# Patient Record
Sex: Female | Born: 1940 | Race: White | Hispanic: No | Marital: Married | Smoking: Never smoker
Health system: Southern US, Community
[De-identification: ages and names within clinical notes are randomized; demographics above are authoritative.]

## PROBLEM LIST (undated history)

## (undated) DIAGNOSIS — K760 Fatty (change of) liver, not elsewhere classified: Secondary | ICD-10-CM

## (undated) DIAGNOSIS — D0590 Unspecified type of carcinoma in situ of unspecified breast: Secondary | ICD-10-CM

## (undated) DIAGNOSIS — K219 Gastro-esophageal reflux disease without esophagitis: Secondary | ICD-10-CM

## (undated) DIAGNOSIS — E7800 Pure hypercholesterolemia, unspecified: Secondary | ICD-10-CM

## (undated) DIAGNOSIS — C50919 Malignant neoplasm of unspecified site of unspecified female breast: Secondary | ICD-10-CM

## (undated) DIAGNOSIS — Z860101 Personal history of adenomatous and serrated colon polyps: Secondary | ICD-10-CM

## (undated) DIAGNOSIS — M797 Fibromyalgia: Secondary | ICD-10-CM

## (undated) DIAGNOSIS — N390 Urinary tract infection, site not specified: Secondary | ICD-10-CM

## (undated) DIAGNOSIS — H8309 Labyrinthitis, unspecified ear: Secondary | ICD-10-CM

## (undated) DIAGNOSIS — F419 Anxiety disorder, unspecified: Secondary | ICD-10-CM

## (undated) DIAGNOSIS — Z86010 Personal history of colonic polyps: Secondary | ICD-10-CM

## (undated) DIAGNOSIS — I10 Essential (primary) hypertension: Secondary | ICD-10-CM

## (undated) DIAGNOSIS — M1990 Unspecified osteoarthritis, unspecified site: Secondary | ICD-10-CM

## (undated) HISTORY — DX: Pure hypercholesterolemia, unspecified: E78.00

## (undated) HISTORY — DX: Personal history of adenomatous and serrated colon polyps: Z86.0101

## (undated) HISTORY — DX: Fibromyalgia: M79.7

## (undated) HISTORY — DX: Gastro-esophageal reflux disease without esophagitis: K21.9

## (undated) HISTORY — DX: Fatty (change of) liver, not elsewhere classified: K76.0

## (undated) HISTORY — DX: Anxiety disorder, unspecified: F41.9

## (undated) HISTORY — DX: Unspecified osteoarthritis, unspecified site: M19.90

## (undated) HISTORY — DX: Unspecified type of carcinoma in situ of unspecified breast: D05.90

## (undated) HISTORY — DX: Essential (primary) hypertension: I10

## (undated) HISTORY — DX: Urinary tract infection, site not specified: N39.0

## (undated) HISTORY — DX: Labyrinthitis, unspecified ear: H83.09

## (undated) HISTORY — DX: Personal history of colonic polyps: Z86.010

## (undated) HISTORY — PX: CATARACT EXTRACTION: SUR2

## (undated) HISTORY — DX: Malignant neoplasm of unspecified site of unspecified female breast: C50.919

---

## 1978-11-26 HISTORY — PX: FOOT SURGERY: SHX648

## 1999-11-27 DIAGNOSIS — D0590 Unspecified type of carcinoma in situ of unspecified breast: Secondary | ICD-10-CM

## 1999-11-27 DIAGNOSIS — C50919 Malignant neoplasm of unspecified site of unspecified female breast: Secondary | ICD-10-CM

## 1999-11-27 HISTORY — DX: Unspecified type of carcinoma in situ of unspecified breast: D05.90

## 1999-11-27 HISTORY — DX: Malignant neoplasm of unspecified site of unspecified female breast: C50.919

## 2000-07-24 ENCOUNTER — Other Ambulatory Visit: Admission: RE | Admit: 2000-07-24 | Discharge: 2000-07-24 | Payer: Self-pay | Admitting: Family Medicine

## 2000-07-27 HISTORY — PX: BREAST BIOPSY: SHX20

## 2000-08-20 HISTORY — PX: BREAST LUMPECTOMY: SHX2

## 2001-07-24 ENCOUNTER — Other Ambulatory Visit: Admission: RE | Admit: 2001-07-24 | Discharge: 2001-07-24 | Payer: Self-pay | Admitting: Family Medicine

## 2001-09-08 ENCOUNTER — Ambulatory Visit (HOSPITAL_COMMUNITY): Admission: RE | Admit: 2001-09-08 | Discharge: 2001-09-08 | Payer: Self-pay | Admitting: Internal Medicine

## 2001-09-08 ENCOUNTER — Encounter: Payer: Self-pay | Admitting: Internal Medicine

## 2001-09-23 ENCOUNTER — Ambulatory Visit (HOSPITAL_COMMUNITY): Admission: RE | Admit: 2001-09-23 | Discharge: 2001-09-23 | Payer: Self-pay | Admitting: Internal Medicine

## 2001-09-23 ENCOUNTER — Encounter: Payer: Self-pay | Admitting: Internal Medicine

## 2002-07-28 ENCOUNTER — Other Ambulatory Visit: Admission: RE | Admit: 2002-07-28 | Discharge: 2002-07-28 | Payer: Self-pay | Admitting: Family Medicine

## 2003-09-06 ENCOUNTER — Other Ambulatory Visit: Admission: RE | Admit: 2003-09-06 | Discharge: 2003-09-06 | Payer: Self-pay | Admitting: Family Medicine

## 2004-09-12 ENCOUNTER — Ambulatory Visit: Payer: Self-pay | Admitting: Family Medicine

## 2004-09-13 ENCOUNTER — Encounter: Payer: Self-pay | Admitting: Family Medicine

## 2004-09-13 ENCOUNTER — Other Ambulatory Visit: Admission: RE | Admit: 2004-09-13 | Discharge: 2004-09-13 | Payer: Self-pay | Admitting: Family Medicine

## 2004-09-13 LAB — CONVERTED CEMR LAB: Pap Smear: NORMAL

## 2004-11-08 ENCOUNTER — Ambulatory Visit: Payer: Self-pay | Admitting: Family Medicine

## 2005-01-16 ENCOUNTER — Ambulatory Visit: Payer: Self-pay | Admitting: Family Medicine

## 2005-06-27 ENCOUNTER — Ambulatory Visit: Payer: Self-pay | Admitting: Family Medicine

## 2005-06-29 ENCOUNTER — Ambulatory Visit: Payer: Self-pay | Admitting: Family Medicine

## 2006-02-27 ENCOUNTER — Ambulatory Visit: Payer: Self-pay | Admitting: Family Medicine

## 2006-07-10 ENCOUNTER — Ambulatory Visit: Payer: Self-pay | Admitting: Family Medicine

## 2006-07-16 ENCOUNTER — Encounter: Payer: Self-pay | Admitting: Family Medicine

## 2006-07-16 ENCOUNTER — Ambulatory Visit: Payer: Self-pay | Admitting: Family Medicine

## 2006-07-16 ENCOUNTER — Other Ambulatory Visit: Admission: RE | Admit: 2006-07-16 | Discharge: 2006-07-16 | Payer: Self-pay | Admitting: Family Medicine

## 2006-07-16 LAB — CONVERTED CEMR LAB: Pap Smear: NORMAL

## 2006-08-09 LAB — FECAL OCCULT BLOOD, GUAIAC: Fecal Occult Blood: NEGATIVE

## 2006-08-12 ENCOUNTER — Ambulatory Visit: Payer: Self-pay | Admitting: Family Medicine

## 2006-10-07 ENCOUNTER — Ambulatory Visit: Payer: Self-pay | Admitting: Family Medicine

## 2006-11-22 ENCOUNTER — Ambulatory Visit: Payer: Self-pay | Admitting: Family Medicine

## 2007-03-26 ENCOUNTER — Encounter: Payer: Self-pay | Admitting: Family Medicine

## 2007-03-26 DIAGNOSIS — I10 Essential (primary) hypertension: Secondary | ICD-10-CM | POA: Insufficient documentation

## 2007-03-26 DIAGNOSIS — N390 Urinary tract infection, site not specified: Secondary | ICD-10-CM | POA: Insufficient documentation

## 2007-03-26 DIAGNOSIS — H8309 Labyrinthitis, unspecified ear: Secondary | ICD-10-CM | POA: Insufficient documentation

## 2007-03-26 DIAGNOSIS — M797 Fibromyalgia: Secondary | ICD-10-CM | POA: Insufficient documentation

## 2007-10-20 ENCOUNTER — Ambulatory Visit: Payer: Self-pay | Admitting: Family Medicine

## 2007-10-20 LAB — CONVERTED CEMR LAB
ALT: 19 units/L (ref 0–35)
AST: 17 units/L (ref 0–37)
BUN: 17 mg/dL (ref 6–23)
Basophils Absolute: 0 10*3/uL (ref 0.0–0.1)
Basophils Relative: 0.6 % (ref 0.0–1.0)
CO2: 28 meq/L (ref 19–32)
Calcium: 9.1 mg/dL (ref 8.4–10.5)
Chloride: 103 meq/L (ref 96–112)
Cholesterol: 164 mg/dL (ref 0–200)
Creatinine, Ser: 0.9 mg/dL (ref 0.4–1.2)
Eosinophils Absolute: 0.2 10*3/uL (ref 0.0–0.6)
Eosinophils Relative: 3 % (ref 0.0–5.0)
GFR calc Af Amer: 80 mL/min
GFR calc non Af Amer: 66 mL/min
Glucose, Bld: 116 mg/dL — ABNORMAL HIGH (ref 70–99)
HCT: 39.7 % (ref 36.0–46.0)
HDL: 45.2 mg/dL (ref >39.0)
Hemoglobin: 13.5 g/dL (ref 12.0–15.0)
LDL Cholesterol: 103 mg/dL — ABNORMAL HIGH (ref 0–99)
Lymphocytes Relative: 26.4 % (ref 12.0–46.0)
MCHC: 34 g/dL (ref 30.0–36.0)
MCV: 89.8 fL (ref 78.0–100.0)
Monocytes Absolute: 0.5 10*3/uL (ref 0.2–0.7)
Monocytes Relative: 7.7 % (ref 3.0–11.0)
Neutro Abs: 3.7 10*3/uL (ref 1.4–7.7)
Neutrophils Relative %: 62.3 % (ref 43.0–77.0)
Platelets: 238 10*3/uL (ref 150–400)
Potassium: 4.7 meq/L (ref 3.5–5.1)
RBC: 4.42 M/uL (ref 3.87–5.11)
RDW: 11.9 % (ref 11.5–14.6)
Sodium: 138 meq/L (ref 135–145)
TSH: 3.66 microintl units/mL (ref 0.35–5.50)
Total CHOL/HDL Ratio: 3.6
Triglycerides: 80 mg/dL (ref 0–149)
VLDL: 16 mg/dL (ref 0–40)
WBC: 6 10*3/uL (ref 4.5–10.5)

## 2007-10-22 ENCOUNTER — Ambulatory Visit: Payer: Self-pay | Admitting: Family Medicine

## 2007-10-22 ENCOUNTER — Other Ambulatory Visit: Admission: RE | Admit: 2007-10-22 | Discharge: 2007-10-22 | Payer: Self-pay | Admitting: Family Medicine

## 2007-10-22 ENCOUNTER — Encounter: Payer: Self-pay | Admitting: Family Medicine

## 2007-10-24 LAB — CONVERTED CEMR LAB: Pap Smear: NORMAL

## 2007-10-30 ENCOUNTER — Encounter (INDEPENDENT_AMBULATORY_CARE_PROVIDER_SITE_OTHER): Payer: Self-pay | Admitting: *Deleted

## 2007-11-11 ENCOUNTER — Telehealth: Payer: Self-pay | Admitting: Family Medicine

## 2007-11-18 ENCOUNTER — Telehealth (INDEPENDENT_AMBULATORY_CARE_PROVIDER_SITE_OTHER): Payer: Self-pay | Admitting: *Deleted

## 2007-12-11 ENCOUNTER — Encounter: Payer: Self-pay | Admitting: Family Medicine

## 2008-04-01 ENCOUNTER — Telehealth: Payer: Self-pay | Admitting: Family Medicine

## 2008-06-08 ENCOUNTER — Ambulatory Visit: Payer: Self-pay | Admitting: Family Medicine

## 2009-03-07 ENCOUNTER — Ambulatory Visit: Payer: Self-pay | Admitting: Family Medicine

## 2009-03-07 LAB — CONVERTED CEMR LAB
ALT: 19 units/L (ref 0–35)
AST: 16 units/L (ref 0–37)
Albumin: 3.9 g/dL (ref 3.5–5.2)
Alkaline Phosphatase: 80 units/L (ref 39–117)
BUN: 11 mg/dL (ref 6–23)
Basophils Absolute: 0 10*3/uL (ref 0.0–0.1)
Basophils Relative: 0.8 % (ref 0.0–3.0)
Bilirubin, Direct: 0 mg/dL (ref 0.0–0.3)
CO2: 30 meq/L (ref 19–32)
Calcium: 9.1 mg/dL (ref 8.4–10.5)
Chloride: 110 meq/L (ref 96–112)
Cholesterol: 179 mg/dL (ref 0–200)
Creatinine, Ser: 0.9 mg/dL (ref 0.4–1.2)
Creatinine,U: 118.6 mg/dL
Eosinophils Absolute: 0.2 10*3/uL (ref 0.0–0.7)
Eosinophils Relative: 3.1 % (ref 0.0–5.0)
GFR calc non Af Amer: 65.9 mL/min (ref >60)
Glucose, Bld: 101 mg/dL — ABNORMAL HIGH (ref 70–99)
HCT: 40.3 % (ref 36.0–46.0)
HDL: 41.8 mg/dL (ref >39.00)
Hemoglobin: 14.1 g/dL (ref 12.0–15.0)
LDL Cholesterol: 116 mg/dL — ABNORMAL HIGH (ref 0–99)
Lymphocytes Relative: 23 % (ref 12.0–46.0)
Lymphs Abs: 1.4 10*3/uL (ref 0.7–4.0)
MCHC: 34.9 g/dL (ref 30.0–36.0)
MCV: 88.4 fL (ref 78.0–100.0)
Microalb Creat Ratio: 1.7 mg/g (ref 0.0–30.0)
Microalb, Ur: 0.2 mg/dL (ref 0.0–1.9)
Monocytes Absolute: 0.5 10*3/uL (ref 0.1–1.0)
Monocytes Relative: 7.4 % (ref 3.0–12.0)
Neutro Abs: 4 10*3/uL (ref 1.4–7.7)
Neutrophils Relative %: 65.7 % (ref 43.0–77.0)
Platelets: 222 10*3/uL (ref 150.0–400.0)
Potassium: 4.9 meq/L (ref 3.5–5.1)
RBC: 4.56 M/uL (ref 3.87–5.11)
RDW: 11.9 % (ref 11.5–14.6)
Sed Rate: 10 mm/hr (ref 0–22)
Sodium: 144 meq/L (ref 135–145)
TSH: 3.31 microintl units/mL (ref 0.35–5.50)
Total Bilirubin: 0.9 mg/dL (ref 0.3–1.2)
Total CHOL/HDL Ratio: 4
Total Protein: 6.9 g/dL (ref 6.0–8.3)
Triglycerides: 107 mg/dL (ref 0.0–149.0)
VLDL: 21.4 mg/dL (ref 0.0–40.0)
WBC: 6.1 10*3/uL (ref 4.5–10.5)

## 2009-03-10 ENCOUNTER — Encounter: Payer: Self-pay | Admitting: Family Medicine

## 2009-03-10 ENCOUNTER — Other Ambulatory Visit: Admission: RE | Admit: 2009-03-10 | Discharge: 2009-03-10 | Payer: Self-pay | Admitting: Family Medicine

## 2009-03-10 ENCOUNTER — Ambulatory Visit: Payer: Self-pay | Admitting: Family Medicine

## 2009-03-10 LAB — CONVERTED CEMR LAB: Pap Smear: NORMAL

## 2009-03-14 ENCOUNTER — Encounter (INDEPENDENT_AMBULATORY_CARE_PROVIDER_SITE_OTHER): Payer: Self-pay | Admitting: *Deleted

## 2009-04-12 ENCOUNTER — Telehealth: Payer: Self-pay | Admitting: Internal Medicine

## 2009-04-12 ENCOUNTER — Ambulatory Visit: Payer: Self-pay | Admitting: Family Medicine

## 2009-04-18 ENCOUNTER — Ambulatory Visit: Payer: Self-pay | Admitting: Internal Medicine

## 2009-04-18 DIAGNOSIS — K219 Gastro-esophageal reflux disease without esophagitis: Secondary | ICD-10-CM | POA: Insufficient documentation

## 2009-04-18 DIAGNOSIS — R1013 Epigastric pain: Secondary | ICD-10-CM | POA: Insufficient documentation

## 2009-04-20 ENCOUNTER — Ambulatory Visit (HOSPITAL_COMMUNITY): Admission: RE | Admit: 2009-04-20 | Discharge: 2009-04-20 | Payer: Self-pay | Admitting: Internal Medicine

## 2009-04-22 ENCOUNTER — Telehealth: Payer: Self-pay | Admitting: Family Medicine

## 2009-04-29 ENCOUNTER — Telehealth: Payer: Self-pay | Admitting: Internal Medicine

## 2009-05-03 ENCOUNTER — Ambulatory Visit: Payer: Self-pay | Admitting: Internal Medicine

## 2009-05-09 ENCOUNTER — Ambulatory Visit: Payer: Self-pay | Admitting: Cardiology

## 2009-05-13 ENCOUNTER — Telehealth: Payer: Self-pay | Admitting: Internal Medicine

## 2009-05-16 ENCOUNTER — Ambulatory Visit: Payer: Self-pay | Admitting: Internal Medicine

## 2009-05-19 ENCOUNTER — Ambulatory Visit: Payer: Self-pay | Admitting: Internal Medicine

## 2009-05-19 ENCOUNTER — Encounter: Payer: Self-pay | Admitting: Internal Medicine

## 2009-05-23 ENCOUNTER — Encounter: Payer: Self-pay | Admitting: Internal Medicine

## 2009-05-24 ENCOUNTER — Telehealth: Payer: Self-pay | Admitting: Internal Medicine

## 2009-06-08 ENCOUNTER — Encounter (INDEPENDENT_AMBULATORY_CARE_PROVIDER_SITE_OTHER): Payer: Self-pay | Admitting: *Deleted

## 2009-06-22 ENCOUNTER — Ambulatory Visit: Payer: Self-pay | Admitting: Family Medicine

## 2009-06-22 LAB — CONVERTED CEMR LAB
BUN: 15 mg/dL (ref 6–23)
CO2: 29 meq/L (ref 19–32)
Calcium: 8.8 mg/dL (ref 8.4–10.5)
Chloride: 113 meq/L — ABNORMAL HIGH (ref 96–112)
Creatinine, Ser: 0.9 mg/dL (ref 0.4–1.2)
GFR calc non Af Amer: 65.84 mL/min (ref >60)
Glucose, Bld: 112 mg/dL — ABNORMAL HIGH (ref 70–99)
Potassium: 4.8 meq/L (ref 3.5–5.1)
Sodium: 145 meq/L (ref 135–145)

## 2009-06-27 ENCOUNTER — Ambulatory Visit: Payer: Self-pay | Admitting: Family Medicine

## 2009-09-20 ENCOUNTER — Ambulatory Visit: Payer: Self-pay | Admitting: Family Medicine

## 2010-01-06 ENCOUNTER — Telehealth: Payer: Self-pay | Admitting: Family Medicine

## 2010-02-21 IMAGING — CT CT ABDOMEN W/ CM
2 of 6 series · 17 of 46 positions shown, 19 images · IV contrast (Omnipaque 300)
Comparison: Report of prior exam dated 09/08/2001

CT ABDOMEN

CLINICAL DATA: Left upper quadrant pain for 5-6 weeks.  Bloating.
History of irritable bowel syndrome.

CT ABDOMEN AND PELVIS WITH CONTRAST
TECHNIQUE: Multidetector CT imaging of the abdomen and pelvis was
performed using the standard protocol following bolus
administration of intravenous contrast.
Contrast: 110 ml Jmnipaque-FEE

[Series 2: abd/ pel · axial · 0.78mm/px · z∈[+900,+1280]mm · 14 of 86 slices shown, 16 images]
[im 5/86  soft-tissue]
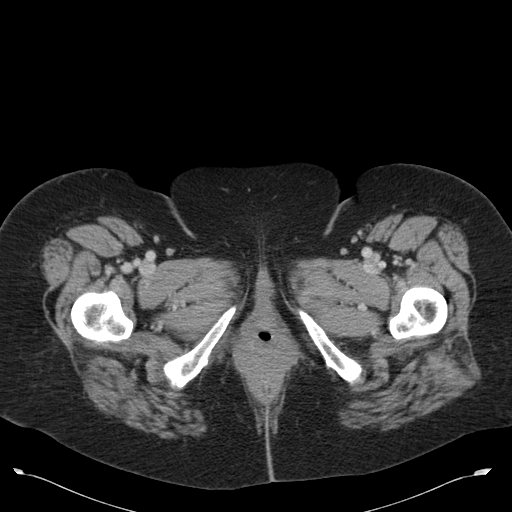
[im 5/86  bone]
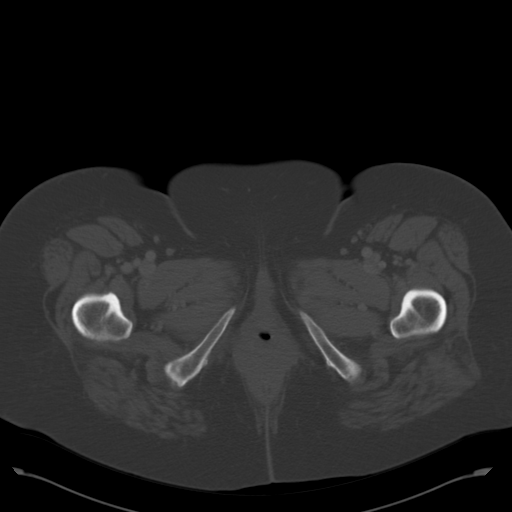
[im 10/86  soft-tissue]
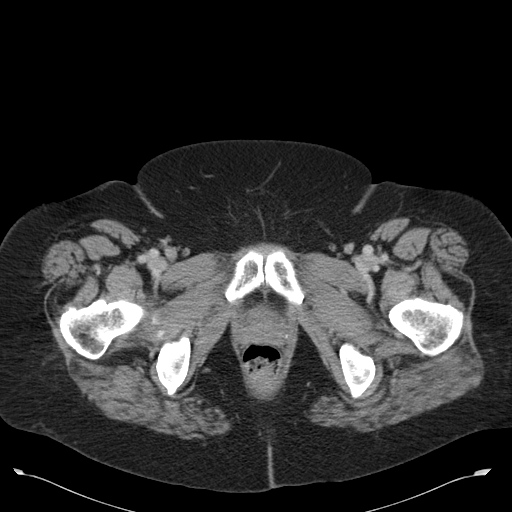
[im 19/86  soft-tissue]
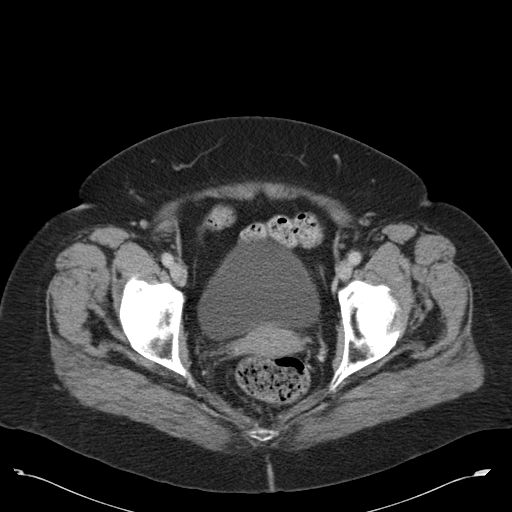
[im 24/86  soft-tissue]
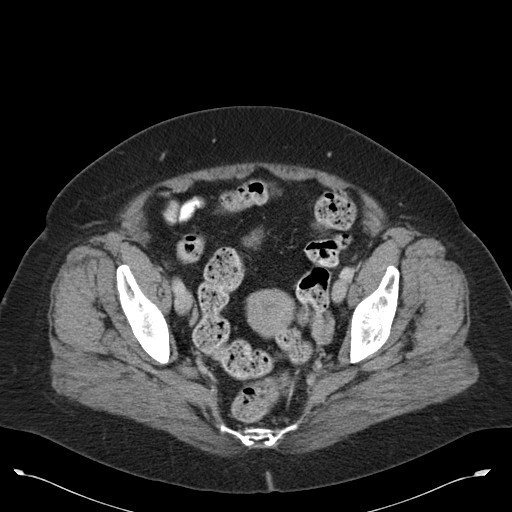
[im 29/86  soft-tissue]
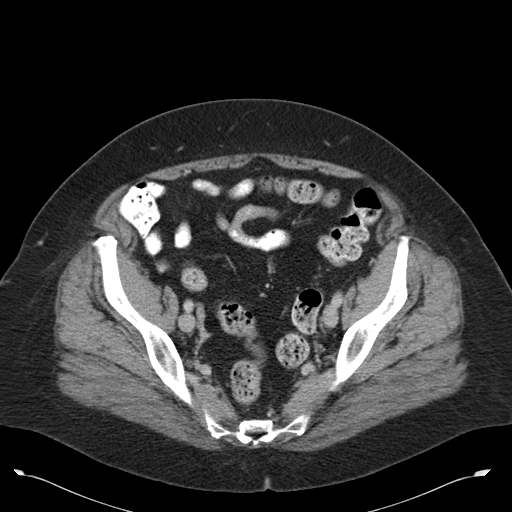
[im 34/86  soft-tissue]
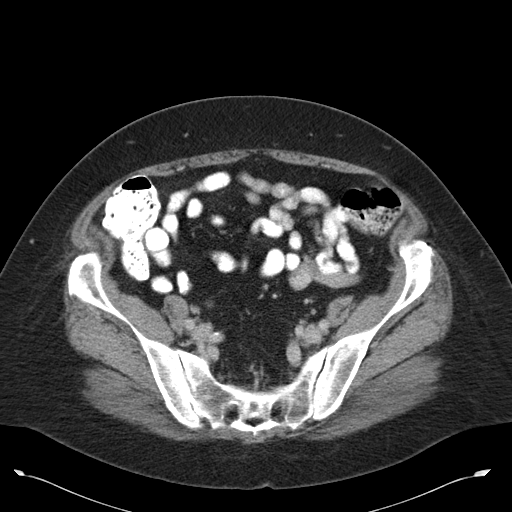
[im 38/86  soft-tissue]
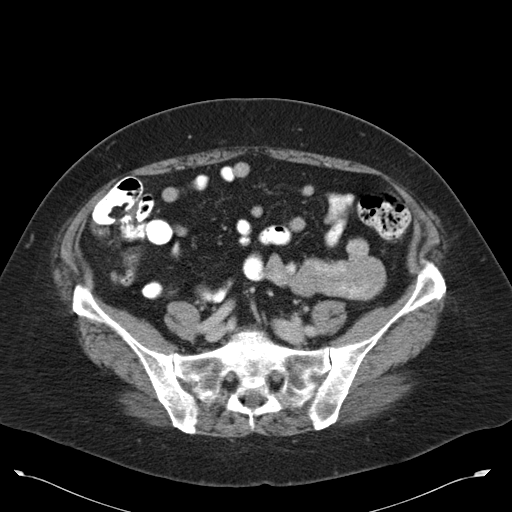
[im 48/86  soft-tissue]
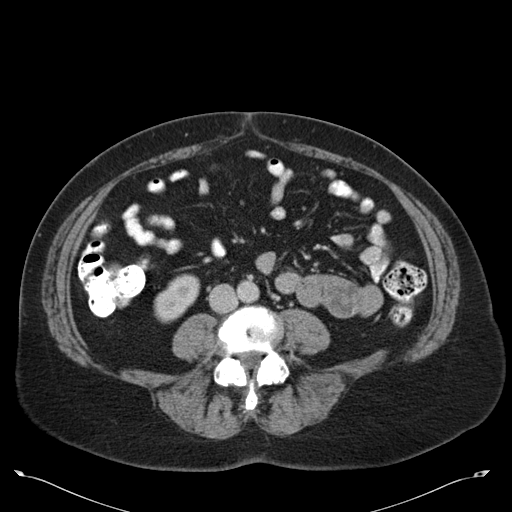
[im 52/86  soft-tissue]
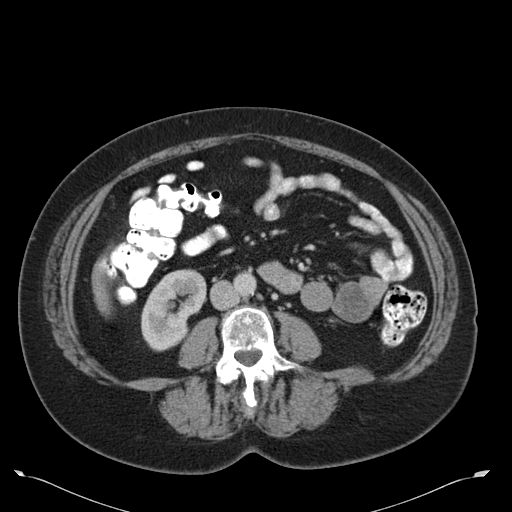
[im 52/86  bone]
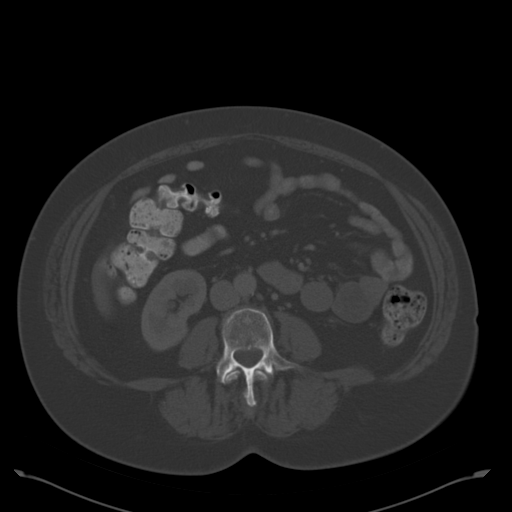
[im 57/86  soft-tissue]
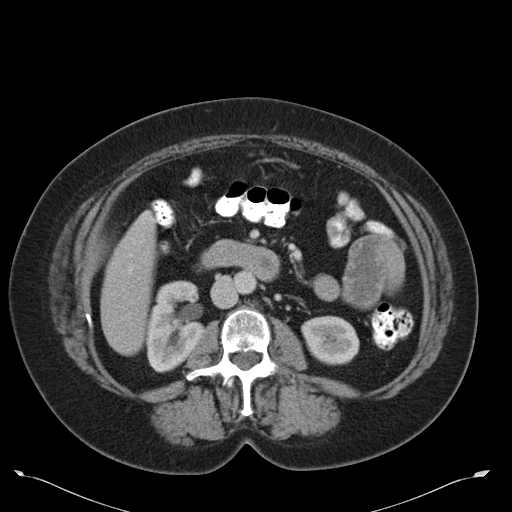
[im 62/86  soft-tissue]
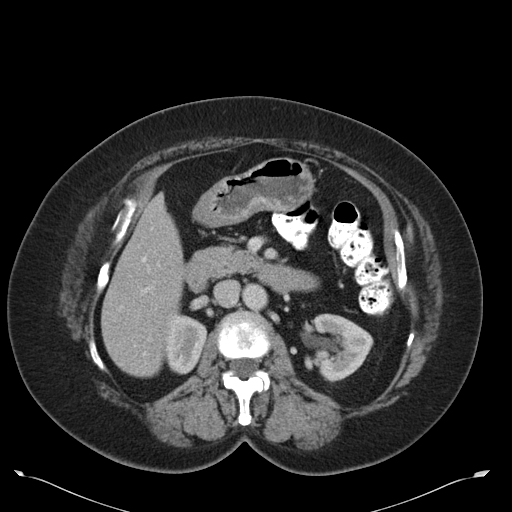
[im 67/86  soft-tissue]
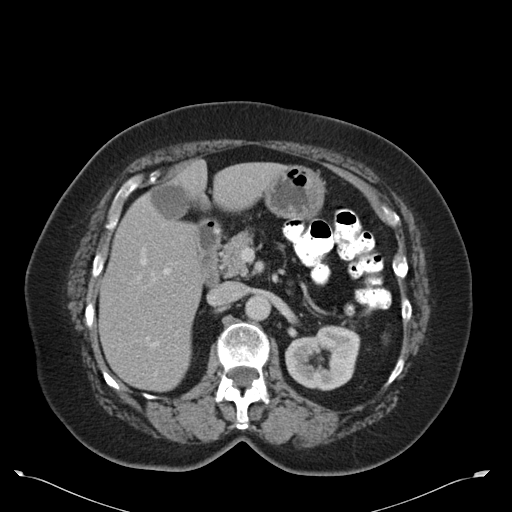
[im 76/86  soft-tissue]
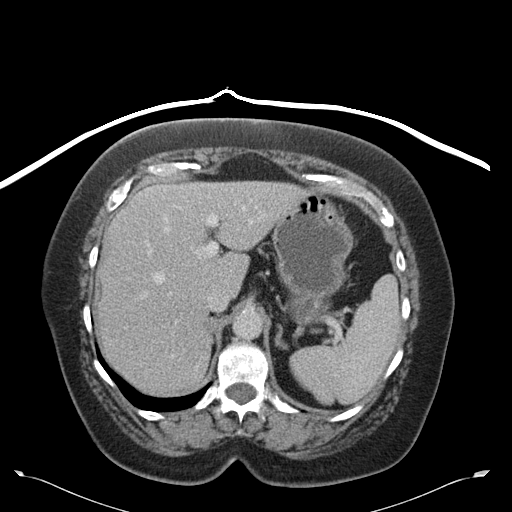
[im 81/86  soft-tissue]
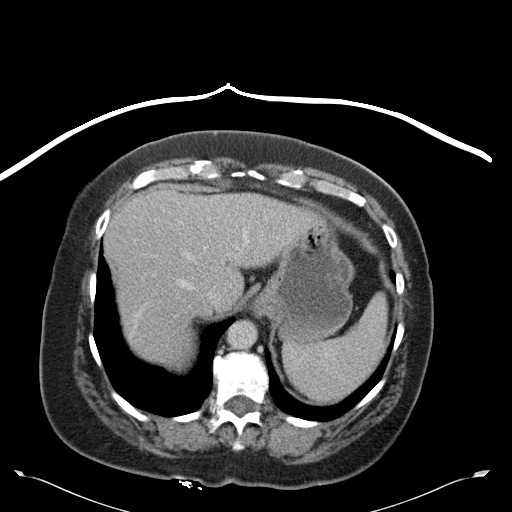

[Series 602: <mpr range> · coronal · 0.87mm/px · 3 of 131 slices shown]
[im 44/131  soft-tissue]
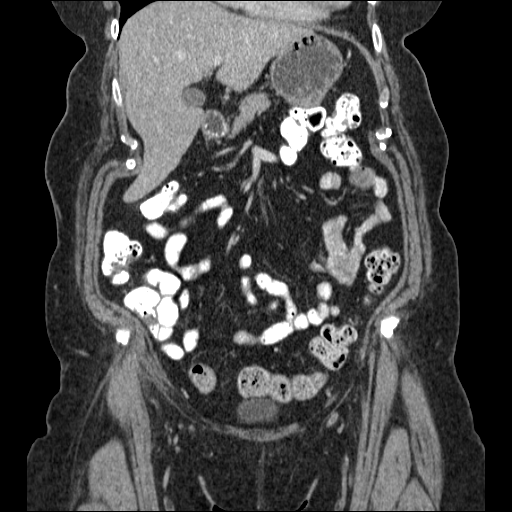
[im 58/131  soft-tissue]
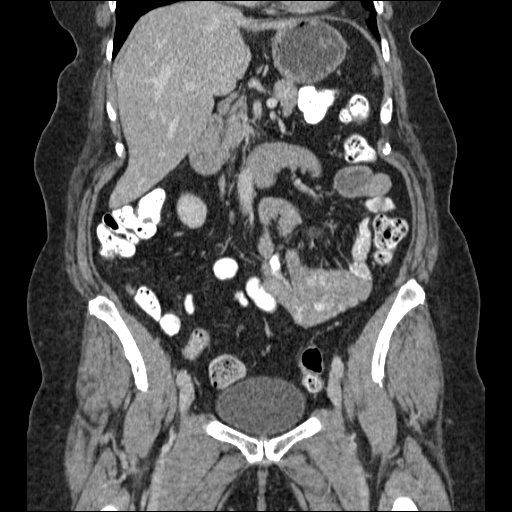
[im 73/131  soft-tissue]
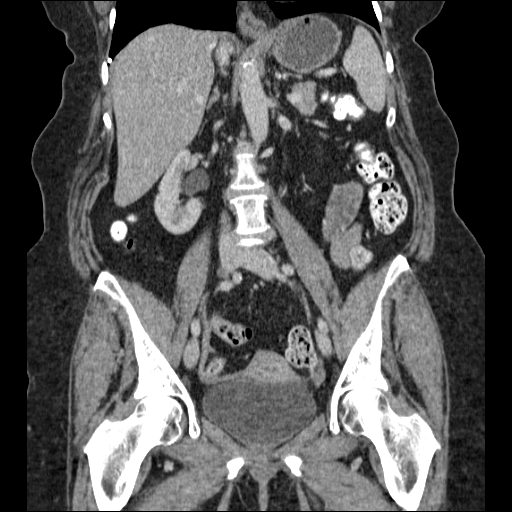

[17 of 46 positions shown; findings below may reference images not displayed]

FINDINGS: The liver, spleen, pancreas, adrenal glands, and kidneys
are normal.  There are no dilated loops of large or small bowel.
No free air or free fluid or significant bony abnormalities.
IMPRESSION: Benign-appearing abdomen.

CT PELVIS
FINDINGS: The terminal ileum and appendix are normal.  Uterus and
ovaries are normal.  No adenopathy or mass lesion or other
significant abnormalities.
IMPRESSION: Benign-appearing pelvis.

## 2010-03-13 ENCOUNTER — Ambulatory Visit: Payer: Self-pay | Admitting: Family Medicine

## 2010-03-13 LAB — CONVERTED CEMR LAB
ALT: 20 units/L (ref 0–35)
AST: 18 units/L (ref 0–37)
Albumin: 4.3 g/dL (ref 3.5–5.2)
Alkaline Phosphatase: 97 units/L (ref 39–117)
BUN: 11 mg/dL (ref 6–23)
Basophils Absolute: 0.1 10*3/uL (ref 0.0–0.1)
Basophils Relative: 0.9 % (ref 0.0–3.0)
Bilirubin, Direct: 0 mg/dL (ref 0.0–0.3)
CO2: 28 meq/L (ref 19–32)
Calcium: 9.4 mg/dL (ref 8.4–10.5)
Chloride: 108 meq/L (ref 96–112)
Cholesterol: 207 mg/dL — ABNORMAL HIGH (ref 0–200)
Creatinine, Ser: 0.9 mg/dL (ref 0.4–1.2)
Creatinine,U: 165.9 mg/dL
Direct LDL: 145.5 mg/dL
Eosinophils Absolute: 0.3 10*3/uL (ref 0.0–0.7)
Eosinophils Relative: 4.6 % (ref 0.0–5.0)
GFR calc non Af Amer: 65.7 mL/min (ref >60)
Glucose, Bld: 114 mg/dL — ABNORMAL HIGH (ref 70–99)
HCT: 41.6 % (ref 36.0–46.0)
HDL: 46.5 mg/dL (ref >39.00)
Hemoglobin: 14.4 g/dL (ref 12.0–15.0)
Lymphocytes Relative: 21.2 % (ref 12.0–46.0)
Lymphs Abs: 1.3 10*3/uL (ref 0.7–4.0)
MCHC: 34.6 g/dL (ref 30.0–36.0)
MCV: 88.3 fL (ref 78.0–100.0)
Microalb Creat Ratio: 4.8 mg/g (ref 0.0–30.0)
Microalb, Ur: 0.8 mg/dL (ref 0.0–1.9)
Monocytes Absolute: 0.4 10*3/uL (ref 0.1–1.0)
Monocytes Relative: 6.9 % (ref 3.0–12.0)
Neutro Abs: 4.1 10*3/uL (ref 1.4–7.7)
Neutrophils Relative %: 66.4 % (ref 43.0–77.0)
Platelets: 248 10*3/uL (ref 150.0–400.0)
Potassium: 4.8 meq/L (ref 3.5–5.1)
RBC: 4.71 M/uL (ref 3.87–5.11)
RDW: 13.1 % (ref 11.5–14.6)
Sed Rate: 15 mm/hr (ref 0–22)
Sodium: 143 meq/L (ref 135–145)
TSH: 3.68 microintl units/mL (ref 0.35–5.50)
Total Bilirubin: 0.6 mg/dL (ref 0.3–1.2)
Total CHOL/HDL Ratio: 4
Total Protein: 7.8 g/dL (ref 6.0–8.3)
Triglycerides: 97 mg/dL (ref 0.0–149.0)
VLDL: 19.4 mg/dL (ref 0.0–40.0)
WBC: 6.2 10*3/uL (ref 4.5–10.5)

## 2010-03-14 LAB — CONVERTED CEMR LAB: Vit D, 25-Hydroxy: 15 ng/mL — ABNORMAL LOW (ref 30–89)

## 2010-03-20 ENCOUNTER — Telehealth: Payer: Self-pay | Admitting: Family Medicine

## 2010-03-20 ENCOUNTER — Ambulatory Visit: Payer: Self-pay | Admitting: Family Medicine

## 2010-03-21 ENCOUNTER — Telehealth: Payer: Self-pay | Admitting: Family Medicine

## 2010-06-16 ENCOUNTER — Encounter: Payer: Self-pay | Admitting: Family Medicine

## 2010-06-29 ENCOUNTER — Encounter (INDEPENDENT_AMBULATORY_CARE_PROVIDER_SITE_OTHER): Payer: Self-pay | Admitting: *Deleted

## 2010-08-25 ENCOUNTER — Telehealth: Payer: Self-pay | Admitting: Family Medicine

## 2010-09-20 ENCOUNTER — Ambulatory Visit: Payer: Self-pay | Admitting: Family Medicine

## 2010-12-26 NOTE — Consult Note (Signed)
Summary: [facility]/Breast ONC Interval Note/Dr. Baron Hamper  [facility]/Breast ONC Interval Note/Dr. Baron Hamper   Imported By: Eleonore Chiquito 02/23/2008 12:59:50  _____________________________________________________________________  External Attachment:    Type:   Image     Comment:   External Document

## 2010-12-26 NOTE — Progress Notes (Signed)
Summary: severe vommiting since last pm request suppos.  Phone Note Call from Patient Call back at Home Phone [phone]   Caller: Patient Call For: schaller Summary of Call: pt c/o vommiting since 8pm last night some diarrhea this morning. seh is not able to keep anything down and she says she has vomitted at least 18 times since last pm. request suppos called in to help stop vomitting. [facility] [address] Initial call taken by: Liane Comber,  November 18, 2007 9:32 AM  Follow-up for Phone Call        Needs to be seen if sxs wrsen. Follow-up by: Shaune Leeks MD,  November 18, 2007 10:05 AM  Additional Follow-up for Phone Call Additional follow up Details #1::        Advised patient.  ......................................................Marland KitchenLiane Comber November 18, 2007 10:34 AM Rx called to pharmacy. ................................................Marland KitchenMarcelle Smiling Kym Fenter November 18, 2007 10:34 AM     New/Updated Medications: PHENADOZ 25 MG  SUPP (PROMETHAZINE HCL) one per rectum q6 hours as needed nausea.   Prescriptions: PHENADOZ 25 MG  SUPP (PROMETHAZINE HCL) one per rectum q6 hours as needed nausea.  #10 x 0   Entered and Authorized by:   Shaune Leeks MD   Signed by:   Shaune Leeks MD on 11/18/2007   Method used:   Print then Give to Patient   RxID:   [id number]

## 2010-12-26 NOTE — Assessment & Plan Note (Signed)
Summary: STOMACH/CLE   Vital Signs:  Patient Profile:   70 Years Old Female Height:     65 inches (165.1 cm) Weight:      217 pounds Temp:     97.6 degrees F oral Pulse rate:   64 / minute Pulse rhythm:   regular BP sitting:   130 / 80  (left arm) Cuff size:   large  Vitals Entered By: Providence Crosby (June 08, 2008 12:37 PM)                 Chief Complaint:  ABD. PAIN.  History of Present Illness: Pt here with husband for upper stomach hurting which has been going on for years. She could tolerate that as it was as  it would typically resolve after bloating and gas with some discomfort.. However about one month ago she got a cold and was coughing a lot and once while coughing had stomach ache-like pain like when real hungry. She since that time gets this sensation off and on  which feels better a lot of time after eating. Last night had similar pain in stomach after going to bed not long after eating chicken salad which was intense and radiated to the back the it has in the past but has continued, albeit at lower intensity throughout this morning. Her stomach discomfort of years has definitively gotten worse since her onset of significant coughing episodes and has worried her.    Prior Medications Reviewed Using: Patient Recall  Current Allergies (reviewed today): ! MACRODANTIN (NITROFURANTOIN MACROCRYSTAL) ! SULFADIAZINE (SULFADIAZINE) ! TOPROL XL (METOPROLOL SUCCINATE)      Physical Exam  General:     Well-developed,well-nourished,in no acute distress; alert,appropriate and cooperative throughout examination, nontoxic and actually looks healthy. Head:     Normocephalic and atraumatic without obvious abnormalities. No apparent alopecia or balding. Eyes:     Conjunctiva clear bilaterally.  Ears:     External ear exam shows no significant lesions or deformities.  Otoscopic examination reveals clear canals, tympanic membranes are intact bilaterally without bulging,  retraction, inflammation or discharge. Hearing is grossly normal bilaterally. Nose:     External nasal examination shows no deformity or inflammation. Nasal mucosa are pink and moist without lesions or exudates. Mouth:     Oral mucosa and oropharynx without lesions or exudates.  Teeth in good repair. Neck:     No deformities, masses, or tenderness noted. Chest Wall:     No deformities, masses, or tenderness noted. Lungs:     Normal respiratory effort, chest expands symmetrically. Lungs are clear to auscultation, no crackles or wheezes. Heart:     Normal rate and regular rhythm. S1 and S2 normal without gallop, murmur, click, rub or other extra sounds. Abdomen:     Bowel sounds positive,abdomen soft and non-tender without masses, organomegaly or hernias noted. No defects in abd wall felt, no bloating or tympany when seen. McBurney's point and Cullen's point nontender.    Impression & Recommendations:  Problem # 1:  ABDOMINAL PAIN, EPIGASTRIC (ICD-789.06) Assessment: New Presumed abdominal muscular strain from coughing exacerbating her probable irritable symptoms of longstanding. Clear liqs for 24 hrs, foll by BRAT, foll by Activia for one week....avoid milk and milk prods for one week. Heat to abd asd often as poss as directed, may try Bentyl...may need GI referral.  Complete Medication List: 1)  Zetia 10 Mg Tabs (Ezetimibe) .... Take one by mouth daily 2)  Lisinopril 5 Mg Tabs (Lisinopril) .... Take one by mouth daily  3)  Protonix 40 Mg Tbec (Pantoprazole sodium) .... Take one by mouth daily 4)  Ativan 0.5 Mg Tabs (Lorazepam) .... Take one by mouth daily prn 5)  Meclizine Hcl 25 Mg Chew (Meclizine hcl) .... One tab by mouth q6 hrs as needed 6)  Phenadoz 25 Mg Supp (Promethazine hcl) .... One per rectum q6 hours as needed nausea.   Patient Instructions: 1)  RTC, call if sxs don't improve. 2)  30 mins spent with pt.   Prescriptions: LISINOPRIL 5 MG TABS (LISINOPRIL) Take one by  mouth daily  #30 Tablet x 12   Entered and Authorized by:   Shaune Leeks MD   Signed by:   Shaune Leeks MD on 06/08/2008   Method used:   Print then Give to Patient   RxID:   [id number]  ]

## 2010-12-26 NOTE — Progress Notes (Signed)
Summary: wants lab results  Phone Note Call from Patient Call back at Home Phone [phone]   Caller: Patient Call For: Shaune Leeks MD Summary of Call: Patient had an appt. for a cpx today and had to cancel it becuase she is feeling sick. She says that she is concerned about her labs because she has not been taking the Zetia for a while now and wants to know if she needs to start taking it again. She says that she stopped taking it because she was having some problems with her stomache. [facility] uses [facility]. if needed. Please adivse. She also wants to know which dr. you recommend for her at out office.  Initial call taken by: Melody Comas,  March 20, 2010 10:43 AM  Follow-up for Phone Call        Please print a copy of her labs and she can come in and pick them up. Follow-up by: Shaune Leeks MD,  March 20, 2010 1:16 PM  Additional Follow-up for Phone Call Additional follow up Details #1::        Labs printed, patient will come by and pick them up.  Additional Follow-up by: Melody Comas,  March 20, 2010 2:05 PM

## 2010-12-26 NOTE — Procedures (Signed)
Summary: Endoscopy   EGD  Procedure date:  05/19/2009  Findings:      Location: [facility]    ENDOSCOPY PROCEDURE REPORT  PATIENT:  Markela, Wee  MR#:  [id number] BIRTHDATE:   1941/07/25, 70 yrs. old   GENDER:   female  ENDOSCOPIST:   Hedwig Morton. Juanda Chance, MD Referred by: Laurita Quint, M.D.  PROCEDURE DATE:  05/19/2009 PROCEDURE:  EGD with biopsy ASA CLASS:   Class II INDICATIONS: abdominal pain, left upper quadr chronic LUQ abd. pain, on Protonix 40 mg qd, CT scan negative  MEDICATIONS:    Versed 8 mg, Fentanyl 75 mcg TOPICAL ANESTHETIC:   Exactacain Spray  DESCRIPTION OF PROCEDURE:   After the risks benefits and alternatives of the procedure were thoroughly explained, informed consent was obtained.  The LB GIF-H180 [id number] endoscope was introduced through the mouth and advanced to the second portion of the duodenum, without limitations.  The instrument was slowly withdrawn as the mucosa was fully examined. <<PROCEDUREIMAGES>>              <<OLD IMAGES>>  A hiatal hernia was found (see image1 and image4). 2-3 cm partially reducible hiatal hernia,  irregular Z-line. With standard forceps, a biopsy was obtained and sent to pathology (see image7).  There were multiple polyps identified. in the body of the stomach (see image6 and image5). multiple small fundic gland polyps  Otherwise the examination was normal. r/o H.Pylori With standard forceps, a biopsy was obtained and sent to pathology (see image3 and image2).    Retroflexed views revealed no abnormalities.    The scope was then withdrawn from the patient and the procedure completed.  COMPLICATIONS:   None  ENDOSCOPIC IMPRESSION:  1) Hiatal hernia  2) Irregular Z-line  3) Polyps, multiple in the body of the stomach  4) Otherwise normal examination  abdominal pain ethiology not well defined, could be due to H Hernia RECOMMENDATIONS:  1) await biopsy results  Protonix 40 mg po qd  add Carafate slurry  10cc po bid x 10 days, # 12 oz, 1 refill  REPEAT EXAM:   In 0 year(s) for.   _______________________________ Hedwig Morton. Juanda Chance, MD    CC:     REPORT OF SURGICAL PATHOLOGY   Case #: [id number] Patient Name: SHAYLYNN, NULTY Office Chart Number:  [id number]   MRN: [id number] Pathologist: Alden Server A. Delila Spence, MD DOB/Age  [birth year] (Age: 70)    Gender: F Date Taken:  05/19/2009 Date Received: 05/20/2009   FINAL DIAGNOSIS   ***MICROSCOPIC EXAMINATION AND DIAGNOSIS***   1.  STOMACH:  BENIGN GASTRIC MUCOSA.  NO HELICOBACTER PYLORI, INTESTINAL METAPLASIA OR ACTIVE INFLAMMATION IDENTIFIED.   2.  ESOPHAGUS, BIOPSY:  -   BENIGN ESOPHAGEAL AND GASTRIC TYPE MUCOSA WITH CHRONIC INFLAMMATION. -   NO INCREASE IN EOSINOPHILS IDENTIFIED. -   NO INTESTINAL METAPLASIA, DYSPLASIA OR MALIGNANCY IDENTIFIED.    COMMENT 1. A Warthin-Starry stain is performed to determine the possibility of the presence of Helicobacter pylori. The Warthin-Starry stain is negative for organisms of Helicobacter pylori. The control(s) stained appropriately.   2. An Alcian Blue stain is performed to determine the presence of intestinal metaplasia (goblet cell metaplasia). No intestinal metaplasia (goblet cell metaplasia) is identified with the Alcian Blue stain. The control stained appropriately. A special stain (PAS-F) is performed to determine the presence or absence of fungal hyphae in this biopsy. The PAS stain is negative for fungal hyphae, and the controls stained appropriately.  (EAA:mw, 05/23/09)    mw Date Reported:  05/23/2009     Alden Server A. Delila Spence, MD *** Electronically Signed Out By EAA ***   May 23, 2009 MRN: [id number]    [facility] [address] [place], [place]  [address]    Dear Ms. Lorrene Reid,  I am pleased to inform you that the biopsies taken during your recent endoscopic examination did not show any evidence of cancer upon pathologic examination.The tissue from Your stomach shows  mild inlammation  Additional information/recommendations:  __No further action is needed at this time.  Please follow-up with      your primary care physician for your other healthcare needs.  __ Please call [phone] to schedule a return visit to review      your condition.  _x_ Continue with the treatment plan as outlined on the day of your      exam.    Please call us if you are having persistent problems or have questions about your condition that have not been fully answered at this time.  Sincerely,  Hart Carwin  This letter has been electronically signed by your physician.   This report was created from the original endoscopy report, which was reviewed and signed by the above listed endoscopist.

## 2010-12-26 NOTE — Letter (Signed)
Summary: Patient Notice-Endo Biopsy Results  [facility]  [address] [place], [place] [address]   Phone: [phone]  Fax: [phone]        May 23, 2009 MRN: [id number]    [address] [place], [place]  [address]    Dear Ms. Lorrene Reid,  I am pleased to inform you that the biopsies taken during your recent endoscopic examination did not show any evidence of cancer upon pathologic examination.The tissue from Your stomach shows mild inlammation  Additional information/recommendations:  __No further action is needed at this time.  Please follow-up with      your primary care physician for your other healthcare needs.  __ Please call [phone] to schedule a return visit to review      your condition.  _x_ Continue with the treatment plan as outlined on the day of your      exam.    Please call us if you are having persistent problems or have questions about your condition that have not been fully answered at this time.  Sincerely,  Hart Carwin  This letter has been electronically signed by your physician.

## 2010-12-26 NOTE — Miscellaneous (Signed)
Summary: [facility] Previsit/prep  Clinical Lists Changes  Observations: Added new observation of ALLERGY REV: Done (05/16/2009 15:03)

## 2010-12-26 NOTE — Assessment & Plan Note (Signed)
Summary: [name] FLU SHOT/[facility]  Nurse Visit   Allergies: 1)  ! Macrodantin (Nitrofurantoin Macrocrystal) 2)  ! Sulfadiazine (Sulfadiazine) 3)  ! Toprol Xl (Metoprolol Succinate)  Orders Added: 1)  Flu Vaccine 15yrs + MEDICARE PATIENTS [Q2039] 2)  Administration Flu vaccine - MCR [G0008]       Flu Vaccine Consent Questions     Do you have a history of severe allergic reactions to this vaccine? no    Any prior history of allergic reactions to egg and/or gelatin? no    Do you have a sensitivity to the preservative Thimersol? no    Do you have a past history of Guillan-Barre Syndrome? no    Do you currently have an acute febrile illness? no    Have you ever had a severe reaction to latex? no    Vaccine information given and explained to patient? yes    Are you currently pregnant? no    Lot Number:[id number]   Exp Date:05/26/2011   Site Given  Left Deltoid IMu Lugene Fuquay CMA (AAMA)  September 20, 2010 11:37 AM

## 2010-12-26 NOTE — Progress Notes (Signed)
Summary: Rx Lorazepam  Phone Note Refill Request Call back at [phone] Message from:  [facility] on January 06, 2010 8:24 AM  Refills Requested: Medication #1:  ATIVAN 1 MG TABS 1/4-1 tab by mouth once daily as needed anxiety.   Last Refilled: 06/30/2009 Received faxed refill request, please advise.     Method Requested: Telephone to Pharmacy Initial call taken by: Linde Gillis CMA Duncan Dull),  January 06, 2010 8:24 AM  Follow-up for Phone Call        Rx called to pharmacy    Prescriptions: ATIVAN 1 MG TABS (LORAZEPAM) 1/4-1 tab by mouth once daily as needed anxiety.  #30 x 0   Entered and Authorized by:   Kerby Nora MD   Signed by:   Kerby Nora MD on 01/06/2010   Method used:   Telephoned to ...       [facility] [phone]* (retail)       [address] [place], [place]  [address]       Ph: [phone] or [phone]       Fax: [phone]   RxID:   [id number]

## 2010-12-26 NOTE — Assessment & Plan Note (Signed)
Summary: F/U Abd pain, [place] results/pp   History of Present Illness Primary GI MD: [name] MD Primary Provider: Laurita Quint, MD Requesting Provider: n/a Chief Complaint: f/u ultrasound, still having epigastric pain that radiates through to back. History of Present Illness:   This is a 70 year old white female with chronic left upper quadrant abdominal pain since 2002 when she had a CT scan of the abdomen and pelvis. There was a questionable small splenic cyst at that time. She since then has had intermittent left upper quadrant discomfort. A colonoscopy was negative. A recent upper abdominal ultrasound on Apr 20, 2009 showed a normal gallbladder and  fatty liver. Patient also has a history of fibromyalgia, urinary tract infections and high blood pressure. She saw Mike Gip, PA-C on 04/18/09 for abdominal pain and dysphepsia but was told everything was normal. Her complaints center around the left costal margin and extending to the back.    GI Review of Systems    Reports abdominal pain and  bloating.     Location of  Abdominal pain: epigastric area.    Denies acid reflux, belching, chest pain, dysphagia with liquids, dysphagia with solids, heartburn, loss of appetite, nausea, vomiting, vomiting blood, weight loss, and  weight gain.        Denies anal fissure, black tarry stools, change in bowel habit, constipation, diarrhea, diverticulosis, fecal incontinence, heme positive stool, hemorrhoids, irritable bowel syndrome, jaundice, light color stool, liver problems, rectal bleeding, and  rectal pain.    Current Medications (verified): 1)  Zetia 10 Mg Tabs (Ezetimibe) .... Take One By Mouth Daily 2)  Lisinopril 10 Mg Tabs (Lisinopril) .... One Tab By Mouth Once Daily 3)  Protonix 40 Mg Tbec (Pantoprazole Sodium) .... Take One By Mouth Daily 4)  Ativan 1 Mg Tabs (Lorazepam) .... 1/4-1 Tab By Mouth Once Daily As Needed Anxiety. 5)  Align  Caps (Misc Intestinal Flora Regulat) .... One  Tablet By Mouth Once Daily  Allergies (verified): 1)  ! Macrodantin (Nitrofurantoin Macrocrystal) 2)  ! Sulfadiazine (Sulfadiazine) 3)  ! Toprol Xl (Metoprolol Succinate)  Past History:  Past Medical History: Reviewed history from 04/29/2009 and no changes required. 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 Minor foot Surgery 1980 Breast bx. 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 Father dec 90 Alzheimers Mother dec 82 Parkinson's Brother dec Prostate Ca Brother dec BPH Prostate Ca  Brother A Stent Brother  Pancreatitis x 2 Brother A 94 No FH of Colon Cancer:  Social History: Reviewed history from 04/18/2009 and no changes required. Occupation: Housewife Married lives w/ Husband Never Smoked Alcohol use-no Drug use-no Patient does not get regular exercise.  Daily Caffeine Use - one per day  Review of Systems  The patient denies allergy/sinus, anemia, anxiety-new, arthritis/joint pain, back pain, blood in urine, breast changes/lumps, change in vision, confusion, cough, coughing up blood, depression-new, fainting, fatigue, fever, headaches-new, hearing problems, heart murmur, heart rhythm changes, itching, menstrual pain, muscle pains/cramps, night sweats, nosebleeds, pregnancy symptoms, shortness of breath, skin rash, sleeping problems, sore throat, swelling of feet/legs, swollen  lymph  glands, thirst - excessive , urination - excessive , urination changes/pain, urine leakage, vision changes, and voice change.         Pertinent positive and negative review of systems were noted in the above HPI. All other ROS was otherwise negative.   Vital Signs:  Patient profile:   70 year old female Height:      65 inches Weight:      217 pounds BMI:     36.24 Pulse rate:   70 / minute Pulse rhythm:   regular BP sitting:   126 / 72  (right arm) Cuff size:   regular  Vitals Entered By: Christie Nottingham CMA (May 03, 2009 3:29 PM)  Physical Exam  General:  alert, oriented and in no distress, very cooperative. Patient is overweight. Eyes:  nonicteric. Mouth:  no lesions. Neck:  Supple; no masses or thyromegaly. Lungs:  Clear throughout to auscultation. Heart:  Regular rate and rhythm; no murmurs, rubs,  or bruits. Abdomen:  protuberant and mildly obese with tenderness along the costal margin mostly when pushing on the ribs. I could not really reproduce the pain entirely by pressing on the ribs. There was no CVA tenderness, right upper quadrant was unremarkable.   Impression & Recommendations:  Problem # 1:  ABDOMINAL PAIN-EPIGASTRIC (ICD-789.06) chronic left upper quadrant abdominal discomfort GI related versus musculoskeletal discomfort. A CT Scan of the abdomen done 8 years ago raised the question of a splenic cyst. We will repeat the CT scan of the abdomen with attention to the left upper quadrant. I think her pain may be related to musculoskeletal factors . It is apparent that she has dyspepsia and needs to continue on PPIs daily. We will proceed with  diagnostic upper endoscopy to rule out any structural abnormalities of her stomach.such as H Hernia Orders: CT Abdomen/Pelvis with Contrast (CT Abd/Pelvis w/con)  Patient Instructions: 1)  CT scan of the abdomen and pelvis with attention to left upper quadrant and IV contrast 2)  If negative proceed with upper endoscopy  3)  Increase Protonix to 40 mg p.o. b.i.d. samples given 4)  Consider MRI of the back to ascess for DJD 5)  Copy sent to : Dr Rene Kocher

## 2010-12-26 NOTE — Letter (Signed)
Summary: [name] letter  [place] at [facility]  [address] [place], [place] [address]   Phone: [phone]  Fax: [phone]       06/29/2010 MRN: [id number]  ALUEL SCHWARZ [address] [place], [place]  [address]  Dear Ms. Lorrene Reid,  [facility], and [facility] announce the retirement of Arta Silence, M.D., from full-time practice at the [facility] office effective May 25, 2010 and his plans of returning part-time.  It is important to Dr. Hetty Ely and to our practice that you understand that [facility] has seven physicians in our office for your health care needs.  We will continue to offer the same exceptional care that you have today.    Dr. Hetty Ely has spoken to many of you about his plans for retirement and returning part-time in the fall.   We will continue to work with you through the transition to schedule appointments for you in the office and meet the high standards that [facility] is committed to.   Again, it is with great pleasure that we share the news that Dr. Hetty Ely will return to [facility] at [facility] in October of 2011 with a reduced schedule.    If you have any questions, or would like to request an appointment with one of our physicians, please call us at [phone] and press the option for Scheduling an appointment.  We take pleasure in providing you with excellent patient care and look forward to seeing you at your next office visit.  Our [facility] Physicians are:  Tillman Abide, M.D. Laurita Quint, M.D. Roxy Manns, M.D. Kerby Nora, M.D. Hannah Beat, M.D. Ruthe Mannan, M.D. We proudly welcomed Raechel Ache, M.D. and Eustaquio Boyden, M.D. to the practice in July/August 2011.  Sincerely,  [facility]

## 2010-12-26 NOTE — Letter (Signed)
Summary: Results Follow up Letter  [place] at [facility]  [address] [place], [place] [address]   Phone: [phone]  Fax: [phone]    03/14/2009 MRN: [id number]  [name] [address] [place], [place]  [address]  Dear Ms. [name],  The following are the results of your recent test(s):  Test         Result    Pap Smear:        Normal _ x____  Not Normal _____ Comments:     Your pap smear report was normal. Please make sure to repeat in one year. Enclosed is a copy of your report> ______________________________________________________ Cholesterol: LDL(Bad cholesterol):         Your goal is less than:         HDL (Good cholesterol):       Your goal is more than: Comments:  ______________________________________________________ Mammogram:        Normal _____  Not Normal _____ Comments:  ___________________________________________________________________ Hemoccult:        Normal _____  Not normal _______ Comments:    _____________________________________________________________________ Other Tests:    We routinely do not discuss normal results over the telephone.  If you desire a copy of the results, or you have any questions about this information we can discuss them at your next office visit.   Sincerely,

## 2010-12-26 NOTE — Progress Notes (Signed)
Summary: regarding zetia  Phone Note Call from Patient Call back at Home Phone [phone] Call back at [phone]   Caller: Patient Call For: Shaune Leeks MD Summary of Call: Pt is asking if she should continue her zetia, US showed fatty liver. She wanted me to ask you. Initial call taken by: Lowella Petties,  Apr 22, 2009 9:31 AM  Follow-up for Phone Call        Fatty Liver means her chol needs to be well controlled. If Zetia is all she can tolerate of the chol medications, she needs to stay on it if possible. Follow-up by: Shaune Leeks MD,  April 26, 2009 7:10 AM  New Problems: FATTY LIVER DISEASE VIA U/S (ICD-571.8)   New Problems: FATTY LIVER DISEASE VIA U/S (ICD-571.8)

## 2010-12-26 NOTE — Assessment & Plan Note (Signed)
Summary: Abd pain on and off/Referred per [name] at Dr Janene Madeira   History of Present Illness Visit Type: Initial Consult Primary GI MD: [name] MD Primary Provider: Laurita Quint, MD Requesting Provider: Laurita Quint, MD Chief Complaint: Abdominal pain that radiates to her back more often to the left, comes and goes. Pain feels better when she eats usually when she eats ice cream. History of Present Illness:   70 YO FEMALE NEW TO GI TODAY ,KNOWN TO DR BRODIE FROM REMOTE COLONOSCOPY. PT COMES IN TODAY FOR EVALUATION OF ABDOMINAL PAIN WHICH SHE SAYS SHE HAS HAD OFF OND ON FOR YEARS BUT SEEMS TO BE WORSE OVER THE PAST COUPLE MONTHS. SHE DESRIBES UPPER ABDOMINAL DISCOMFORT IN THE EPIGASTRIC AND LUQ  AREA,SOMETIMES RADIATING INTO THE BACK. SHE HAS INDIGESTION,SOME BLOATING,AND GAS. APPETITE IS FINE,WT STABLE,NO N/V. BMS NORMAL NO MELENA OR HEME. SHE OFTEN WILL EAT YOGURT OR ICE CREAM WHEN HER STOMACH HURTS AND THIS SEEMS TO HELP. SHE IS ON PROTONIX ONCE DAILY, NO R EGULAR ASA OR NSAIDS.SHE FEEL S THAT SHE HAS BEEN UNDER MORE STRESS RECENTLY,WORRIES ABOUT HER HUSBAND WHO HAS COPD,AND HAS DIFFICULTY BREATHING AT TIMES.SHE HAS ATIVAN  AT HOME ,MAY TAKE A QUARTER OF ONE IF STOMACH BOTHERING HER AND  WILL FEEL BETTER.Marland Kitchen   GI Review of Systems    Reports abdominal pain, bloating, and  heartburn.     Location of  Abdominal pain: generalized.    Denies acid reflux, belching, chest pain, dysphagia with liquids, dysphagia with solids, loss of appetite, nausea, vomiting, vomiting blood, weight loss, and  weight gain.        Denies anal fissure, black tarry stools, change in bowel habit, constipation, diarrhea, diverticulosis, fecal incontinence, heme positive stool, hemorrhoids, irritable bowel syndrome, jaundice, light color stool, liver problems, rectal bleeding, and  rectal pain. Preventive Screening-Counseling & Management     Does Patient Exercise: no    Current Medications (verified): 1)  Zetia  10 Mg Tabs (Ezetimibe) .... Take One By Mouth Daily 2)  Lisinopril 10 Mg Tabs (Lisinopril) .... One Tab By Mouth Once Daily 3)  Protonix 40 Mg Tbec (Pantoprazole Sodium) .... Take One By Mouth Daily 4)  Ativan 1 Mg Tabs (Lorazepam) .... 1/2-1 Tab By Mouth Once Daily As Needed Anxiety.  Allergies (verified): 1)  ! Macrodantin (Nitrofurantoin Macrocrystal) 2)  ! Sulfadiazine (Sulfadiazine) 3)  ! Toprol Xl (Metoprolol Succinate)  Past History:  Past Medical History:    Hypertension    ANXIETY    GERD  Past Surgical History:    Reviewed history from 03/10/2009 and no changes required:    Minor foot Surgery 1980    Breast bx. positive R ([facility])  [facility] 09/01    Catarracts 1988  1990 ([facility])    R Breast lumpectomy Malignant Stage I ([facility])  08/20/2000  Family History:    Father dec 90 Alzheimers    Mother dec 82 Parkinson's    Brother dec Prostate Ca    Brother dec BPH Prostate Ca     Brother A Stent    Brother  Pancreatitis x 2    Brother A 94    No FH of Colon Cancer:  Social History:    Occupation: Housewife    Married lives w/ Husband    Never Smoked    Alcohol use-no    Drug use-no    Patient does not get regular exercise.     Daily Caffeine Use - one per day  Review of Systems  The patient complains of fatigue, swelling of feet/legs, and urine leakage.         ROS OTHERWISE AS IN HPI  Vital Signs:  Patient profile:   70 year old female Height:      65 inches Weight:      219.4 pounds BMI:     36.64 Pulse rate:   70 / minute Pulse rhythm:   regular BP sitting:   130 / 80  (left arm) Cuff size:   regular  Vitals Entered By: Harlow Mares CMA (Apr 18, 2009 3:08 PM)  Physical Exam  General:  Well developed, well nourished, no acute distress. Head:  Normocephalic and atraumatic. Eyes:  PERRLA, no icterus. Neck:  Supple; no masses or thyromegaly. Lungs:  Clear throughout to auscultation. Heart:  Regular rate and rhythm; no murmurs, rubs,  or  bruits. Abdomen:  SOFT, BASICALLY NONTENDER, NO MASS OR HSM,BS+ Rectal:  HEME NEGATIVE STOOL. Msk:  Symmetrical with no gross deformities. Normal posture. Neurologic:  Alert and  oriented x4;  grossly normal neurologically.   Impression & Recommendations:  Problem # 1:  ABDOMINAL PAIN, EPIGASTRIC (ICD-789.06) Assessment Deteriorated 70 YO FEMALE WITH CHRONIC DYSPEPTIC SXS EXACERBATED BY ANXIETY. SUSPECT NON ULCER DYSPEPSIA/IBS R/O GB DISEASE  BLAND LOW GAS DIET LABS FROM RECENT CPX REVIEWED FROM 4/10-UNREMARKABLE  CONTINUE PROTONIX 40 MG DAILY ADD TRIAL OF ALIGN ONE DAILY ADD TRIAL OF ROBINUL FORTE 2 MG TWICE DILY SCHEDULE UPPER ABDOMINAL US FOLLOW UP WITH DR BRODIE IN 3 WEEKS,IS SXS DO NOT RESPOND TO ABOVE CONSIDER EGD. Orders: Ultrasound Abdomen (UAS)  Problem # 2:  GERD (ICD-530.81) Assessment: Comment Only AS ABOVE  Problem # 3:  COLON NEOPLASIA SCREENING Assessment: Comment Only PT THINKS SHE HAD COLONOSCOPY WITH DR BRODIE HERE,I CANNOT FIND ANY RECORD OF THAT, HAD SEEN DR Maryruth Bun PREVIOUSLY ,WE WILL CONTACT THAT OFFICE FOR ANY RECORDS  Patient Instructions: 1)  We ordered an Upper Abdominal Ultrasound for Wed 04-20-09 at 9Am. Location [facility], 1st floor. 2)  We sent a prescription to your pharmacy for Robinul Forte 2 MG twice daily.   3)  Continue the Protonix once daily. 4)  Take Align once daily for one month. Samples provided. You can take that continuously if it is helping. 5)  Stay on a low gas diet. 6)  Copy sent to : Laurita Quint, MD 7)  The medication list was reviewed and reconciled.  All changed / newly prescribed medications were explained.  A complete medication list was provided to the patient / caregiver. Prescriptions: ROBINUL-FORTE 2 MG TABS (GLYCOPYRROLATE) Take 1 tab two times a day for spasms, bloating  #60 x 2   Entered by:   Lowry Ram CMA   Authorized by:   Sammuel Cooper PA-c   Signed by:   Lowry Ram CMA on 04/18/2009   Method used:    [facility]        [facility] [phone]* (retail)       [address] [place], [place]  [address]       Ph: [phone] or [phone]       Fax: [phone]   RxID:   [id number]

## 2010-12-26 NOTE — Progress Notes (Signed)
Summary: Rx Lorazepam  Phone Note Refill Request Call back at [phone] Message from:  [facility] on March 21, 2010 8:52 AM  Refills Requested: Medication #1:  ATIVAN 1 MG TABS 1/4-1 tab by mouth once daily as needed anxiety.   Last Refilled: 01/06/2010 Received faxed refill request please advise.   Method Requested: Electronic Initial call taken by: Linde Gillis CMA Duncan Dull),  March 21, 2010 8:52 AM  Follow-up for Phone Call        Rx called to pharmacy Follow-up by: Benny Lennert CMA Duncan Dull),  March 21, 2010 10:12 AM    Prescriptions: ATIVAN 1 MG TABS (LORAZEPAM) 1/4-1 tab by mouth once daily as needed anxiety.  #30 x 0   Entered and Authorized by:   Kerby Nora MD   Signed by:   Kerby Nora MD on 03/21/2010   Method used:   Telephoned to ...       [facility] [phone]* (retail)       [address] [place], [place]  [address]       Ph: [phone] or [phone]       Fax: [phone]   RxID:   [id number]

## 2010-12-26 NOTE — Assessment & Plan Note (Signed)
Summary: FLU SHOT/CLE  Nurse Visit   Allergies: 1)  ! Macrodantin (Nitrofurantoin Macrocrystal) 2)  ! Sulfadiazine (Sulfadiazine) 3)  ! Toprol Xl (Metoprolol Succinate)  Orders Added: 1)  Flu Vaccine 62yrs + [90658] 2)  Administration Flu vaccine - MCR [G0008]    Flu Vaccine Consent Questions     Do you have a history of severe allergic reactions to this vaccine? no    Any prior history of allergic reactions to egg and/or gelatin? no    Do you have a sensitivity to the preservative Thimersol? no    Do you have a past history of Guillan-Barre Syndrome? no    Do you currently have an acute febrile illness? no    Have you ever had a severe reaction to latex? no    Vaccine information given and explained to patient? yes    Are you currently pregnant? no    Lot Number:[id number]   Exp Date:05/25/2010   Site Given  Left Deltoid IMu Lugene Fuquay CMA (AAMA)  September 21, 2009 4:08 PM

## 2010-12-26 NOTE — Letter (Signed)
Summary: Results Follow up Letter  [place] at [facility]  [address] [place], [place] [address]   Phone: [phone]  Fax: [phone]    10/30/2007 MRN: [id number]  [name] [address] [place], [place]  [address]  Dear Ms. [name],  The following are the results of your recent test(s):  Test         Result    Pap Smear:        Normal __X ___  Not Normal _____ Comments:YOUR PAP SMEAR REPORT WAS NORMAL. PLEASE MAKE SURE TO REPEAT IN ONE YEAR. ENCLOSED IS A COPY OF YOUR REPORT. ______________________________________________________ Cholesterol: LDL(Bad cholesterol):         Your goal is less than:         HDL (Good cholesterol):       Your goal is more than: Comments:  ______________________________________________________ Mammogram:        Normal _____  Not Normal _____ Comments:  ___________________________________________________________________ Hemoccult:        Normal _____  Not normal _______ Comments:    _____________________________________________________________________ Other Tests:    We routinely do not discuss normal results over the telephone.  If you desire a copy of the results, or you have any questions about this information we can discuss them at your next office visit.   Sincerely,

## 2010-12-26 NOTE — Progress Notes (Signed)
Summary: CONDITION UPDATE  Phone Note Call from Patient Call back at Home Phone [phone]   Call For: DR Aina Rossbach Reason for Call: Talk to Nurse Summary of Call: Condition update Initial call taken by: Leanor Kail [facility],  May 13, 2009 12:29 PM  Follow-up for Phone Call        Pt. states, "I just don't feel good" Continues with mid.back pain, although "It isn't as bad as it was" and epigastric bloating. Denies n/v, fever,constipation,diarrhea, rectal bleeding,black stools. Takes Air cabin crew daily. DR.Ameah Chanda PLEASE ADVISE Follow-up by: Laureen Ochs LPN,  May 13, 2009 12:46 PM  Additional Follow-up for Phone Call Additional follow up Details #1::        I suggest for her to undergo an EGD to further eval. her upper abd. pain in the setting of normal sono. and CT scan. Additional Follow-up by: Hart Carwin,  May 13, 2009 2:59 PM    Additional Follow-up for Phone Call Additional follow up Details #2::    Pt. scheduled a previsit for 05-16-09 at 2:30pm and Endoscopy at [facility] on 05-19-09 at 3pm. Pt. instructed to call back as needed.  Follow-up by: Laureen Ochs LPN,  May 13, 2009 4:05 PM

## 2010-12-26 NOTE — Progress Notes (Signed)
Summary: Abd Pian  Phone Note From Other Clinic   Caller: Shirlee Limerick [phone] @ Dr Hetty Ely Call For: Dr Juanda Chance Reason for Call: Schedule Patient Appt Summary of Call: Abd Pain x3wk. Refused to schedule next available July 15th appoinment. Wonders if she can be worked in with PA sooner. Initial call taken by: Leanor Kail [facility],  Apr 12, 2009 11:36 AM  Follow-up for Phone Call        Tracie Harrier at Dr.Schaller office and left message we had called back.Ulis Rias RN  Apr 12, 2009 12:23 PM Armando Reichert returned call. Scheduled pt as a work -in with Amy-PA on 04-18-2009 at 8:30am, She will contact pt Follow-up by: Ulis Rias RN,  Apr 12, 2009 12:25 PM

## 2010-12-26 NOTE — Progress Notes (Signed)
Summary: results  Phone Note Call from Patient Call back at Home Phone [phone]   Caller: Patient Call For: Juanda Chance Reason for Call: Talk to Nurse, Lab or Test Results Summary of Call: Patient wants her egd results  Initial call taken by: Tawni Levy,  May 24, 2009 10:27 AM    Pt called.  EGD letter and CT abd/pelvis results read to her as requested.  Pt very concerned that there is no improvement to her pain and she plans to call again if after completing her 10 days of Carafate two times a day she continues to see no improvement.

## 2010-12-26 NOTE — Progress Notes (Signed)
Summary: sooner appt request  Phone Note Call from Patient Call back at Home Phone [phone] Call back at [phone]   Caller: Patient Call For: Dr. Juanda Chance Reason for Call: Talk to Nurse Details for Reason: sooner appt request Summary of Call: pain underneath left ribcage has worsened and pt is not comfortable waiting until 6.25.2010 for her appt w/ Dr. Juanda Chance Initial call taken by: Vallarie Mare,  April 29, 2009 1:22 PM  Follow-up for Phone Call        1)Appointment moved to 05-03-09 at 3pm with Dr.Avian Konigsberg 2) If symptoms become worse call back immediately or go to ER.  Follow-up by: Laureen Ochs LPN,  April 30, 3243 1:44 PM

## 2010-12-26 NOTE — Progress Notes (Signed)
Summary: RX ATIVAN  Phone Note Refill Request Message from:  Fax from Pharmacy on Apr 01, 2008 2:58 PM  Refills Requested: Medication #1:  ATIVAN 0.5 MG TABS Take one by mouth daily prn   Last Ref[facility]: 11/12/2007 [phone]  Initial call taken by: Providence Crosby,  Apr 01, 2008 2:58 PM  Follow-up for Phone Call        PRESCRIPTION CALLED TO PHARMACY Follow-up by: Providence Crosby,  Apr 01, 2008 6:18 PM      Prescriptions: ATIVAN 0.5 MG TABS (LORAZEPAM) Take one by mouth daily prn  #30 x 0   Entered and Authorized by:   Shaune Leeks MD   Signed by:   Shaune Leeks MD on 04/01/2008   Method used:   Print then Give to Patient   RxID:   [id number]  Shaune Leeks MD  Apr 01, 2008 6:09 PM

## 2010-12-26 NOTE — Progress Notes (Signed)
Summary: refill request for ativan  Phone Note Refill Request Message from:  Fax from Pharmacy  Refills Requested: Medication #1:  ATIVAN 1 MG TABS 1/4-1 tab by mouth once daily as needed anxiety.   Last Refilled: 03/21/2010 [facility] [address], [phone].  Initial call taken by: Lowella Petties CMA,  August 25, 2010 12:23 PM  Follow-up for Phone Call        Over due for yearly eval..has not been seen in over 1 year.Marland KitchenHAve her make appt with SChaller or other new MD. Refill once.  Follow-up by: Kerby Nora MD,  August 25, 2010 1:56 PM  Additional Follow-up for Phone Call Additional follow up Details #1::        Rx called to pharmacy,patient advised Additional Follow-up by: Benny Lennert CMA Duncan Dull),  August 25, 2010 4:10 PM    Prescriptions: ATIVAN 1 MG TABS (LORAZEPAM) 1/4-1 tab by mouth once daily as needed anxiety.  #30 x 0   Entered and Authorized by:   Kerby Nora MD   Signed by:   Kerby Nora MD on 08/25/2010   Method used:   Telephoned to ...       [facility] [phone]* (retail)       [address] [place], [place]  [address]       Ph: [phone] or [phone]       Fax: [phone]   RxID:   [id number]

## 2010-12-26 NOTE — Assessment & Plan Note (Signed)
Summary: CPX   Vital Signs:  Patient profile:   70 year old female Height:      65 inches Weight:      218 pounds BMI:     36.41 Temp:     97.7 degrees F oral Pulse rate:   60 / minute Pulse rhythm:   regular BP sitting:   140 / 80  (left arm) Cuff size:   large  Vitals Entered By: Providence Crosby (March 10, 2009 9:38 AM) CC: check up with pap// hemoccult cards to patien   History of Present Illness: Pt here for Comp Exam, had mammo and U/S 3/25, then Bx 3/31...benign the size of BB and then reseen 4/12 with nml exam...to have recheck in 6 mos.Marland KitchenMarland KitchenAll of this at [facility]. Pt has had soreness of the leg on medial side of superficial vein of lower right leg. This happened a few mos ago and didn't want to come in because of risk of flu. 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 She sometimes has abd distention...with improvement with  Sees Burl Urolo for chronic UTIs and has Noroxin to take prophylactically.  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 Take One By Mouth Daily 2)  Lisinopril 5 Mg Tabs (Lisinopril) .... Take One By Mouth Daily 3)  Protonix 40 Mg Tbec (Pantoprazole Sodium) .... Take One By Mouth Daily 4)  Ativan 0.5 Mg Tabs (Lorazepam) .... Take One By Mouth Daily Prn 5)  Meclizine Hcl 25 Mg  Chew (Meclizine Hcl) .... One Tab By Mouth Q6 Hrs As Needed 6)  Phenadoz 25 Mg  Supp  (Promethazine Hcl) .... One Per Rectum Q6 Hours As Needed Nausea.  Allergies: 1)  ! Macrodantin (Nitrofurantoin Macrocrystal) 2)  ! Sulfadiazine (Sulfadiazine) 3)  ! Toprol Xl (Metoprolol Succinate)  Past History:  Past Medical History:    Hypertension     (03/26/2007)  Family History:    Father dec 90 Alzheimers    Mother dec 82 Parkinson's    Brother dec Prostate Ca    Brother dec BPH Prostate Ca     Brother A Stent    Brother A Pancreatitis x 2    Brother A     Brother A     Brother A 91 (03/26/2007)  Social History:    Occupation: Housewife    Married lives w/ Husband    Never Smoked    Alcohol use-no    Drug use-no     (03/26/2007)  Risk Factors:    Alcohol Use: N/A    >5 drinks/d w/in last 3 months: N/A    Caffeine Use: 1 (03/10/2009)    Diet: N/A    Exercise: no (03/10/2009)  Risk Factors:    Smoking Status: never (03/10/2009)    Packs/Day: N/A    Cigars/wk: N/A    Pipe Use/wk: N/A    Cans of  tobacco/wk: N/A    Passive Smoke Exposure: no (03/10/2009)  Past Surgical History:    Minor foot Surgery 1980    Breast bx. 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 Eyes:  Denies blurring, discharge, double vision, eye irritation, eye pain, halos, itching, light sensitivity, red eye, vision loss-1 eye, and vision loss-both eyes. ENT:  Complains of ringing in ears; denies decreased hearing, difficulty swallowing, ear discharge, earache, hoarseness, nasal congestion, nosebleeds, postnasal drainage, sinus pressure, and sore throat; bilat noise. CV:  Denies bluish discoloration of lips or nails, chest pain or discomfort, difficulty breathing at night, difficulty breathing while lying down, fainting, fatigue, leg cramps with exertion,  lightheadness, near fainting, palpitations, shortness of breath with exertion, swelling of feet, swelling of hands, and weight gain. Resp:  Denies chest discomfort, chest pain with inspiration, cough, coughing up blood, excessive snoring, hypersomnolence, morning headaches, pleuritic, shortness of breath, sputum productive, and wheezing. GI:  Complains of indigestion; denies abdominal pain, bloody stools, change in bowel habits, constipation, dark tarry stools, diarrhea, excessive appetite, gas, hemorrhoids, loss of appetite, nausea, vomiting, vomiting blood, and yellowish skin color; chronic. GU:  Complains of nocturia; denies abnormal vaginal bleeding, decreased libido, discharge, dysuria, genital sores, hematuria, incontinence, urinary frequency, and urinary hesitancy; h/o chronic cystitis  Nocturia one to two times a night.. MS:  Complains of low back pain and muscle aches; denies joint pain, joint redness, joint swelling, loss of strength, mid back pain, muscle , cramps, muscle weakness, stiffness, and thoracic pain; long muscle aches. Chronic low back pain.. Derm:  Denies changes in color of skin, changes in nail beds, dryness, excessive perspiration, flushing, hair loss, insect bite(s), itching, lesion(s), poor wound healing, and rash. Neuro:  Complains of poor balance; denies brief paralysis, difficulty with concentration, disturbances in coordination, falling down, headaches, inability to speak, memory loss, numbness, seizures, sensation of room spinning, tingling, tremors, visual disturbances, and weakness; psoitional vertigo.Marland Kitchen  Physical Exam  General:  Well-developed,well-nourished,in no acute distress; alert,appropriate and cooperative throughout examination, nontoxic and  looks healthy. Head:  Normocephalic and atraumatic without obvious abnormalities. No apparent alopecia or balding. Eyes:  Conjunctiva clear bilaterally.  Ears:  External ear exam shows no significant lesions or  deformities.  Otoscopic examination reveals clear canals, tympanic membranes are intact bilaterally without bulging, retraction, inflammation or discharge. Hearing is grossly normal bilaterally. Nose:  External nasal examination shows no deformity or inflammation. Nasal mucosa are pink and moist without lesions or exudates. Mouth:  Oral mucosa and oropharynx without lesions or exudates.  Teeth in good repair. Neck:  No deformities, masses, or tenderness noted. Chest Wall:  No deformities, masses, or tenderness noted. Lungs:  Normal respiratory effort, chest expands symmetrically. Lungs are clear to auscultation, no crackles or wheezes. Heart:  Normal rate and regular rhythm. S1 and S2 normal without gallop, murmur, click, rub or other extra sounds. Abdomen:  Bowel sounds positive,abdomen soft and non-tender without masses, organomegaly or hernias noted. No defects in abd wall felt, no bloating or tympany when seen. McBurney's point and Cullen's point nontender. Rectal:  No external abnormalities noted. Normal sphincter tone. No rectal masses or tenderness. G neg. Genitalia:  Pelvic Exam:        External: normal female genitalia without lesions or masses  Vagina: normal without lesions or masses        Cervix: normal without lesions or masses        Adnexa: normal bimanual exam without masses or fullness        Uterus: normal by palpation        Pap smear: performed Msk:  No deformity or scoliosis noted of thoracic or lumbar spine.   Pulses:  R and L carotid,radial,femoral,dorsalis pedis and posterior tibial pulses are full and equal bilaterally Extremities:  No clubbing, cyanosis, edema, or deformity noted with normal full range of motion of all joints.   Neurologic:  No cranial nerve deficits noted. Station and gait are normal. Plantar reflexes are down-going bilaterally. DTRs are symmetrical throughout. Sensory, motor and coordinative functions appear intact. Syptomatic dizziness with  laying down and getting back up from laying down. Skin:  Intact without suspicious lesions or rashes Cervical Nodes:  No lymphadenopathy noted Axillary Nodes:  No palpable lymphadenopathy Inguinal Nodes:  No significant adenopathy Psych:  Cognition and judgment appear intact. Alert and cooperative with normal attention span and concentration. No apparent delusions, illusions, hallucinations   Impression & Recommendations:  Problem # 1:  HYPERTENSION (ICD-401.9) Assessment Unchanged  Her updated medication list for this problem includes:    Lisinopril 5 Mg Tabs (Lisinopril) .Marland Kitchen... Take one by mouth daily  BP today: 140/80 Prior BP: 130/80 (06/08/2008)  Labs Reviewed: K+: 4.9 (03/07/2009) Creat: : 0.9 (03/07/2009)   Chol: 179 (03/07/2009)   HDL: 41.80 (03/07/2009)   LDL: 116 (03/07/2009)   TG: 107.0 (03/07/2009)  Problem # 2:  HYPERCHOLESTEROLEMIA 212/LDL144 (ICD-272.0) Assessment: Unchanged  Her updated medication list for this problem includes:    Zetia 10 Mg Tabs (Ezetimibe) .Marland Kitchen... Take one by mouth daily  Labs Reviewed: SGOT: 16 (03/07/2009)   SGPT: 19 (03/07/2009)   HDL:41.80 (03/07/2009), 45.2 (10/20/2007)  LDL:116 (03/07/2009), 103 (10/20/2007)  Chol:179 (03/07/2009), 164 (10/20/2007)  Trig:107.0 (03/07/2009), 80 (10/20/2007)  Problem # 3:  HYPERGLYCEMIA 104 (ICD-790.6) Assessment: Improved Has done well, cont avoiding sweets and carbs.  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  Problem # 5:  FIBROMYALGIA (ICD-729.1) Assessment: Unchanged Continues with global aches and pains...cont curr meds.  Problem # 6:  ABDOMINAL PAIN, EPIGASTRIC (ICD-789.06) Assessment: Unchanged Now with gas, discusses Simethicone for trmt when symptomatic.  Problem # 7:  CA IN SITU, R BREAST (ICD-233.0) Recently biopsied....benign. To have recheck in 6 mos. Exam today low risk.  Complete Medication List: 1)  Zetia 10 Mg Tabs (Ezetimibe) .... Take  one by mouth daily 2)  Lisinopril 5 Mg Tabs (Lisinopril) .... Take one by mouth daily 3)  Protonix 40 Mg Tbec (Pantoprazole sodium) .... Take one by mouth daily 4)  Ativan 1 Mg Tabs (Lorazepam) .... 1/2-1 tab by mouth once daily as needed anxiety. 5)  Meclizine Hcl 25 Mg Chew (Meclizine hcl) .... One tab by mouth q6 hrs as needed 6)  Phenadoz 25 Mg Supp (Promethazine hcl) .... One per rectum q6 hours as needed nausea.  Patient Instructions: 1)  RTC 6 mos for recheck BP, sooner as needed. 2)  Pneumonia and Zostavax today. Prescriptions: ATIVAN 1 MG TABS (LORAZEPAM) 1/2-1 tab by mouth once daily as needed anxiety.  #30 x 2   Entered and Authorized by:   Shaune Leeks MD   Signed by:   Shaune Leeks MD on 03/10/2009   Method used:   Print then Give to Patient   RxID:   [id number]  Appended Document: CPX    Clinical Lists Changes  Orders: Added new Service order of Zoster (Shingles) Vaccine Live [phone]) - Signed Added new Service order of Admin 1st Vaccine ([id number]) - Signed Observations: Added new observation of ZOSTAVAX VIS: 09/07/05 given March 10, 2009. (03/10/2009 11:23) Added new observation of ZOSTAVAX LOT: 4034V (03/10/2009 11:23) Added new observation of ZOSTAVAX EXP: 03/16/2010 (03/10/2009 11:23) Added new observation of ZOSTAVAX BY: Burna Mortimer Kroeker (03/10/2009 11:23) Added new observation of ZOSTAVAX RTE: [place] (03/10/2009 11:23) Added new observation of ZOSTAVAXDOSE: 0.5 ml (03/10/2009 11:23) Added new observation of ZOSTAVAX MFR: Merck (03/10/2009 11:23) Added new observation of ZOSTAVAXSITE: left deltoid (03/10/2009 11:23) Added new observation of ZOSTAVAX: Zostavax (03/10/2009 11:23)      Vaccine Type: Zostavax   Zostavax # 1    Vaccine Type: Zostavax    Site: left deltoid    Mfr: Merck    Dose: 0.5 ml    Route: [place]    Given by: Providence Crosby    Exp. Date: 03/16/2010    Lot #: [id number]    VIS given: 09/07/05 given March 10, 2009.

## 2010-12-26 NOTE — Progress Notes (Signed)
Summary: lorazepam rx  Phone Note Call from Patient Call back at Home Phone [phone]   Caller: Patient Call For: Noriel Guthrie Summary of Call: pt request lorazepam rx for vertigo. Meclizine didn't really help Initial call taken by: Liane Comber,  November 11, 2007 11:34 AM      Prescriptions: ATIVAN 0.5 MG TABS (LORAZEPAM) Take one by mouth daily prn  #30 x 0   Entered and Authorized by:   Shaune Leeks MD   Signed by:   Shaune Leeks MD on 11/11/2007   Method used:   Print then Give to Patient   RxID:   [id number]     Appended Document: lorazepam rx [facility] [address]

## 2010-12-26 NOTE — Assessment & Plan Note (Signed)
Summary: Swelling and discuss BP   Vital Signs:  Patient profile:   70 year old female Height:      65 inches Weight:      218 pounds Temp:     97.1 degrees F oral Pulse rate:   68 / minute Pulse rhythm:   regular BP sitting:   130 / 90  (left arm) Cuff size:   large  Vitals Entered By: Providence Crosby (Apr 12, 2009 10:55 AM) CC: feels bad// swelling, abd pain   History of Present Illness: Pt here for feeling bad for about three weeks. Her feet and ankles have been swelling, abd is hurting from stomach into the back which  she thought might be her urinary system and saw the urologist...was given lasix and noroxin. She also had a bladder U/S which was nml. This has not made any difference.   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 Take One By Mouth Daily 2)  Lisinopril 5 Mg Tabs (Lisinopril) .... Take One By Mouth Daily 3)  Protonix 40 Mg Tbec (Pantoprazole Sodium) .... Take One By Mouth Daily 4)  Ativan 1 Mg Tabs (Lorazepam) .... 1/2-1 Tab By Mouth Once Daily As Needed Anxiety. 5)  Meclizine Hcl 25 Mg  Chew (Meclizine Hcl) .... One Tab By Mouth Q6 Hrs As Needed 6)  Phenadoz 25 Mg  Supp (Promethazine Hcl) .... One Per Rectum Q6 Hours As Needed Nausea.  Allergies: 1)  ! Macrodantin (Nitrofurantoin Macrocrystal) 2)  ! Sulfadiazine (Sulfadiazine) 3)  ! Toprol Xl (Metoprolol Succinate)  Past History:  Past Medical History:    Hypertension     (03/26/2007)  Past Surgical History:    Minor foot Surgery 1980    Breast bx. 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 (03/10/2009)  Family History:    Father dec 90 Alzheimers    Mother dec 82 Parkinson's    Brother dec Prostate Ca    Brother dec BPH Prostate Ca     Brother A Stent    Brother A Pancreatitis x 2    Brother A     Brother A     Brother A 91 (03/26/2007)  Social History:    Occupation: Housewife    Married lives w/ Husband    Never Smoked    Alcohol use-no    Drug use-no     (03/26/2007)  Risk Factors:    Alcohol Use: N/A    >5 drinks/d w/in last 3 months: N/A    Caffeine Use: 1 (03/10/2009)    Diet: N/A    Exercise: no (03/10/2009)  Risk Factors:    Smoking Status: never (03/10/2009)    Packs/Day: N/A    Cigars/wk: N/A    Pipe Use/wk: N/A    Cans of tobacco/wk: N/A    Passive Smoke Exposure: no (03/10/2009)  Physical Exam  General:  Well-developed,well-nourished,in no acute distress; alert,appropriate and cooperative throughout examination, nontoxic and  looks healthy. Head:  Normocephalic and atraumatic without obvious abnormalities. No  apparent alopecia or balding. Eyes:  Conjunctiva clear bilaterally.  Ears:  External ear exam shows no significant lesions or deformities.  Otoscopic examination reveals clear canals, tympanic membranes are intact bilaterally without bulging, retraction, inflammation or discharge. Hearing is grossly normal bilaterally. Nose:  External nasal examination shows no deformity or inflammation. Nasal mucosa are pink and moist without lesions or exudates. Mouth:  Oral mucosa and oropharynx without lesions or exudates.  Teeth in good repair. Neck:  No deformities, masses, or tenderness noted. Chest Wall:  No deformities, masses, or tenderness noted. Lungs:  Normal respiratory effort, chest expands symmetrically. Lungs are clear to auscultation, no crackles or wheezes. Heart:  Normal rate and regular rhythm. S1 and S2 normal without gallop, murmur, click, rub or  other extra sounds. Abdomen:  Bowel sounds positive,abdomen soft and non-tender without masses, organomegaly or hernias noted. No defects in abd wall felt, no bloating or tympany when seen. McBurney's point and Cullen's point nontender. C/o pain in RUQ to back but nontender in those areas. Still has GB but NT there. Msk:  No deformity or scoliosis noted of thoracic or lumbar spine.   Extremities:  No clubbing, cyanosis or deformity noted with normal full range of motion of all joints.  1+ edema of LEs bilat to lower shin anteriorally. Skin:  No rash reported.   Impression & Recommendations:  Problem # 1:  ABDOMINAL PAIN, EPIGASTRIC (ICD-789.06) Assessment Unchanged Refer to GI for eval. If not felt to be problem, will look at back. Has no rash, pain seems to be rightsided. Orders: Gastroenterology Referral (GI)  Problem # 2:  HYPERTENSION (ICD-401.9) Assessment: Unchanged Not well controlled. Will incr ACEI to help both BP and edema. If edema worsens, will change med. Her updated medication list for this problem includes:    Lisinopril 10 Mg Tabs (Lisinopril) ..... One tab by mouth once daily    Furosemide 20 Mg Tabs (Furosemide) .Marland Kitchen... 1/2 tablet daily given by urologist  BP today: 130/90 Prior BP: 140/80 (03/10/2009)  Labs Reviewed: K+: 4.9 (03/07/2009) Creat: : 0.9 (03/07/2009)   Chol: 179 (03/07/2009)   HDL: 41.80 (03/07/2009)   LDL: 116 (03/07/2009)   TG: 107.0 (03/07/2009)  Problem # 3:  FIBROMYALGIA (ICD-729.1) Assessment: Unchanged ? Real problem underlying most of this?  Will follow.  Complete Medication List: 1)  Zetia 10 Mg Tabs (Ezetimibe) .... Take one by mouth daily 2)  Lisinopril 10 Mg Tabs (Lisinopril) .... One tab by mouth once daily 3)  Protonix 40 Mg Tbec (Pantoprazole sodium) .... Take one by mouth daily 4)  Ativan 1 Mg Tabs (Lorazepam) .... 1/2-1 tab by mouth once daily as needed anxiety. 5)  Furosemide 20 Mg Tabs (Furosemide) .... 1/2 tablet daily given by  urologist  Patient Instructions: 1)  Refer to GI. 2)  Incr Lisinopril. 3)  RTC 2 mos., BMET  prior. 4)  35 mins spent with pt. Prescriptions: LISINOPRIL 10 MG TABS (LISINOPRIL) one tab by mouth once daily  #30 x 12   Entered and Authorized by:   Shaune Leeks MD   Signed by:   Shaune Leeks MD on 04/12/2009   Method used:   [facility]        [facility]  [phone]* (retail)       [address] [place], [place]  [address]       Ph: [phone] or [phone]  Fax: [phone]   RxID:   [id number]

## 2010-12-26 NOTE — Assessment & Plan Note (Signed)
Summary: 2 mo. f/u/bir   Vital Signs:  Patient profile:   70 year old female Weight:      218 pounds BMI:     36.41 Temp:     97.9 degrees F oral Pulse rate:   76 / minute Pulse rhythm:   regular BP sitting:   120 / 70  (left arm) Cuff size:   regular  Vitals Entered By: Providence Crosby LPN (June 27, 2009 2:31 PM) CC: 2 month followup, Hypertension Management   History of Present Illness: Pt here with husband. She has been off track due to staying with brother at [place], 59 yoa with CABG and aortic valva replacement!! He was quite sick for a while but now starting to do better.  She is finally improving after 2 months of abd pain, treated by Dr Juanda Chance. Polyps were found in her stomach and she was told they will resolve after she gets of Protonix. She hasn't had any for three weeks and feels better.  Her BP is resultantly better today as well. She is finally starting to feel human. She has gained weight from being at her brother's bedside for over two weeks and is noew motivated to begin exercise and watch her diet.  Hypertension History:      Positive major cardiovascular risk factors include female age 41 years old or older, hyperlipidemia, and hypertension.  Negative major cardiovascular risk factors include non-tobacco-user status.     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 Take One By Mouth Daily 2)  Lisinopril 10 Mg Tabs (Lisinopril) .... One Tab By Mouth Once Daily 3)  Protonix 40 Mg Tbec  (Pantoprazole Sodium) .... Take One By Mouth Daily 4)  Ativan 1 Mg Tabs (Lorazepam) .... 1/4-1 Tab By Mouth Once Daily As Needed Anxiety. 5)  Align  Caps (Misc Intestinal Flora Regulat) .... One Tablet By Mouth Once Daily 6)  Carafate 1 Gm/70ml  Susp (Sucralfate) .... Carafate Slurry 10cc By Mouth Two Times A Day X 10 Days.  Allergies: 1)  ! Macrodantin (Nitrofurantoin Macrocrystal) 2)  ! Sulfadiazine (Sulfadiazine) 3)  ! Toprol Xl (Metoprolol Succinate)  Physical Exam  General:  Well-developed,well-nourished,in no acute distress; alert,appropriate and cooperative throughout examination, nontoxic and  looks healthy. Head:  Normocephalic and atraumatic without obvious abnormalities. No apparent alopecia or balding. Eyes:  Conjunctiva clear bilaterally.  Ears:  External ear exam shows no significant lesions or deformities.  Otoscopic examination reveals clear canals, tympanic membranes are intact bilaterally without bulging, retraction, inflammation or discharge. Hearing is grossly normal bilaterally. Nose:  External nasal examination shows no deformity or inflammation. Nasal mucosa are pink and moist without lesions or exudates. Mouth:  Oral mucosa and oropharynx without lesions or exudates.  Teeth in good repair. Neck:  No deformities, masses, or tenderness noted. Lungs:  Normal respiratory effort, chest expands symmetrically. Lungs are clear to auscultation, no crackles or wheezes. Heart:  Normal rate and regular rhythm. S1 and S2 normal without gallop, murmur, click, rub or other extra sounds. Abdomen:  Bowel sounds positive,abdomen soft and non-tender without masses, organomegaly or hernias noted. No defects in abd wall felt, no bloating or tympany when seen. McBurney's point and Cullen's point nontender. C/o pain in RUQ to back but nontender in those areas. Still has GB but NT there. Extremities:  No clubbing, cyanosis or deformity noted with normal full range of motion of all joints.  1+  edema of LEs bilat to ankles.   Impression & Recommendations:  Problem # 1:  GERD (ICD-530.81) Assessment Improved  Her updated medication list for this problem includes:    Protonix 40 Mg Tbec (Pantoprazole sodium) .Marland Kitchen... Take one by mouth daily    Carafate 1 Gm/25ml Susp (Sucralfate) .Marland Kitchen... Carafate slurry 10cc by mouth two times a day x 10 days.  Problem # 2:  ABDOMINAL PAIN-EPIGASTRIC (ICD-789.06) Assessment: Improved  Problem # 3:  HYPERGLYCEMIA 104 (ICD-790.6) Assessment: Unchanged Discussed. Bmet still shows elevation...discussed avoiding sweets and carbs.  Problem # 4:  HYPERTENSION (ICD-401.9) Assessment: Improved Better today. Is only taking 1/2 Lisinopril but good nos today, so cont as is. Her updated medication list for this problem includes:    Lisinopril 10 Mg Tabs (Lisinopril) ..... One half  tab by mouth once daily  BP today: 120/70 Prior BP: 126/72 (05/03/2009)  10 Yr Risk Heart Disease: 13 %  Labs Reviewed: K+: 4.8 (06/22/2009) Creat: : 0.9 (06/22/2009)   Chol: 179 (03/07/2009)   HDL: 41.80 (03/07/2009)   LDL: 116 (03/07/2009)   TG: 107.0 (03/07/2009)  Complete Medication List: 1)  Zetia 10 Mg Tabs (Ezetimibe) .... Take one by mouth daily 2)  Lisinopril 10 Mg Tabs (Lisinopril) .... One half  tab by mouth once daily 3)  Protonix 40 Mg Tbec (Pantoprazole sodium) .... Take one by mouth daily 4)  Ativan 1 Mg Tabs (Lorazepam) .... 1/4-1 tab by mouth once daily as needed anxiety. 5)  Align Caps (Misc intestinal flora regulat) .... One tablet by mouth once daily 6)  Carafate 1 Gm/104ml Susp (Sucralfate) .... Carafate slurry 10cc by mouth two times a day x 10 days.  Hypertension Assessment/Plan:      The patient's hypertensive risk group is category B: At least one risk factor (excluding diabetes) with no target organ damage.  Her calculated 10 year risk of coronary heart disease is 13 %.  Today's blood pressure is 120/70.    Patient Instructions: 1)  RTC 4 mos.

## 2010-12-26 NOTE — Letter (Signed)
Summary: [facility]  [facility]   Imported By: Lanelle Bal 07/04/2010 09:15:25  _____________________________________________________________________  External Attachment:    Type:   Image     Comment:   External Document  Appended Document: [facility]    Clinical Lists Changes  Observations: Added new observation of PAST MED HX: Current Problems:  FATTY LIVER DISEASE VIA U/S (ICD-571.8) GERD (ICD-530.81) ABDOMINAL PAIN-EPIGASTRIC (ICD-789.06) LABYRINTHITIS, CHRONIC (ICD-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   (07/04/2010 9:34)       Past History:  Past Medical History: Current Problems:  FATTY LIVER DISEASE VIA U/S (ICD-571.8) GERD (ICD-530.81) ABDOMINAL PAIN-EPIGASTRIC (ICD-789.06) LABYRINTHITIS, CHRONIC (ICD-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

## 2010-12-26 NOTE — Miscellaneous (Signed)
Summary: carafate rx.  Clinical Lists Changes  Medications: Added new medication of CARAFATE 1 GM/10ML  SUSP (SUCRALFATE) carafate slurry 10cc by mouth two times a day x 10 days. - Signed Rx of CARAFATE 1 GM/10ML  SUSP (SUCRALFATE) carafate slurry 10cc by mouth two times a day x 10 days.;  #12oz. x 1;  Signed;  Entered by: Darlyn Read RN;  Authorized by: Hart Carwin;  Method used: Electronically to [facility]. [phone]*, [address] [place], [place], [place]  [address], Ph: [phone] or [phone], Fax: [phone]    Prescriptions: CARAFATE 1 GM/10ML  SUSP (SUCRALFATE) carafate slurry 10cc by mouth two times a day x 10 days.  #12oz. x 1   Entered by:   Darlyn Read RN   Authorized by:   Hart Carwin   Signed by:   Darlyn Read RN on 05/19/2009   Method used:   Electronically to        [facility] [phone]* (retail)       [address] [place], [place]  [address]       Ph: [phone] or [phone]       Fax: [phone]   RxID:   [id number]

## 2010-12-26 NOTE — Op Note (Signed)
Summary: COLON                         [facility]  Patient:    [name], [name] Visit Number: [id number] MRN: [id number]          Service Type: END Location: ENDO Attending Physician:  Mervin Hack Dictated by:   Hedwig Morton. Juanda Chance, M.D. [facility] Proc. Date: 09/23/01 Admit Date:  09/23/2001                             Procedure Report  PROCEDURE:  Colonoscopy.  SURGEON:  Hedwig Morton. Juanda Chance, M.D.  INDICATIONS:  This is a 70 year old white female who has had left upper quadrant abdominal pain, epigastric pain, and nausea.  She also has a positive family history of colon cancer in her grandmother.  CT scan of the abdomen was negative.  She has not responded to PPIs or antispasmodics.  She has a sister with breast CA.  She is undergoing colonoscopy for evaluation of left upper quadrant abdominal pain.  ENDOSCOPE:  Olympus single channel videoscope.  SEDATION:  Versed 9 mg IV and Demerol 75 mg IV.  FINDINGS:  The Olympus single channel videoscope was passed under direct vision through the rectum to sigmoid colon.  The patient was monitored by pulse oximeter and oxygen saturations were normal.  Anal canal and rectal ampulla was unremarkable.  The sigmoid colon was normal, somewhat tortuous, but there were no diverticula.  Prep was excellent.  Mucosa appeared normal throughout the descending colon, splenic flexure, transverse colon, hepatic flexure, ascending colon to the cecum.  The cecal pouch and ileocecal valve were unremarkable.  Video photographs of the cecal pouch were obtained.  The colonoscope was then retracted and the colon decompressed.  The patient tolerated the procedure well.  IMPRESSION:  Normal colonoscopy to the cecum.  PLAN:  Continue Robinul 2 mg p.r.n. abdominal pain which is most likely due to irritable bowel syndrome.  We will also discuss possibly upper endoscopy because of the patients upper GI symptoms. Dictated by:   Hedwig Morton. Juanda Chance, M.D. [facility]  Attending Physician:  Mervin Hack DD:  09/23/01 TD:  09/24/01 Job: [id number]

## 2010-12-26 NOTE — Assessment & Plan Note (Signed)
Summary: not new/ cpx/tsc   Vital Signs:  Patient Profile:   70 Years Old Female Height:     65 inches (165.1 cm) Weight:      213 pounds Temp:     97.6 degrees F oral Pulse rate:   88 / minute Pulse rhythm:   regular BP sitting:   130 / 80  (left arm) Cuff size:   large  Vitals Entered By: Providence Crosby (October 22, 2007 11:04 AM)                 Chief Complaint:  check up //hemocult cards to patient.  History of Present Illness: Having some dizziness after turning over in bed. Otherwise doing ok. Has some hurting in inguinal area with prolonged standing. Otherwise is doing well.  Current Allergies (reviewed today): ! MACRODANTIN (NITROFURANTOIN MACROCRYSTAL) ! SULFADIAZINE (SULFADIAZINE) ! TOPROL XL (METOPROLOL SUCCINATE)    Risk Factors:  Passive smoke exposure:  no HIV high-risk behavior:  no Caffeine use:  1 drinks per day Exercise:  no Seatbelt use:  100 %   Review of Systems  General      Denies chills, fatigue, fever, loss of appetite, malaise, sleep disorder, sweats, weakness, and weight loss.  Eyes      Denies blurring, discharge, double vision, eye irritation, eye pain, halos, itching, light sensitivity, red eye, vision loss-1 eye, and vision loss-both eyes.  ENT      Complains of ringing in ears.      Denies decreased hearing, difficulty swallowing, ear discharge, earache, hoarseness, nasal congestion, nosebleeds, postnasal drainage, sinus pressure, and sore throat.      noise  CV      Denies bluish discoloration of lips or nails, chest pain or discomfort, difficulty breathing at night, difficulty breathing while lying down, fainting, fatigue, leg cramps with exertion, lightheadness, near fainting, palpitations, shortness of breath with exertion, swelling of feet, swelling of hands, and weight gain.  Resp      Denies chest discomfort, chest pain with inspiration, cough, coughing up blood, excessive snoring, hypersomnolence, morning headaches,  pleuritic, shortness of breath, sputum productive, and wheezing.  GI      Denies abdominal pain, bloody stools, change in bowel habits, constipation, dark tarry stools, diarrhea, excessive appetite, gas, hemorrhoids, indigestion, loss of appetite, nausea, vomiting, vomiting blood, and yellowish skin color.  GU      Complains of nocturia.      Denies abnormal vaginal bleeding, decreased libido, discharge, dysuria, genital sores, hematuria, incontinence, urinary frequency, and urinary hesitancy.      chronic twice  MS      Denies joint pain, joint redness, joint swelling, loss of strength, low back pain, mid back pain, muscle aches, muscle , cramps, muscle weakness, stiffness, and thoracic pain.  Derm      Denies changes in color of skin, changes in nail beds, dryness, excessive perspiration, flushing, hair loss, insect bite(s), itching, lesion(s), poor wound healing, and rash.  Neuro      Complains of poor balance.      Denies brief paralysis, difficulty with concentration, disturbances in coordination, falling down, headaches, inability to speak, memory loss, numbness, seizures, sensation of room spinning, tingling, tremors, visual disturbances, and weakness.      chronic labyrinthitis.   Physical Exam  General:     Well-developed,well-nourished,in no acute distress; alert,appropriate and cooperative throughout examination Head:     Normocephalic and atraumatic without obvious abnormalities. No apparent alopecia or balding. Eyes:     Conjunctiva clear  bilaterally.  Ears:     External ear exam shows no significant lesions or deformities.  Otoscopic examination reveals clear canals, tympanic membranes are intact bilaterally without bulging, retraction, inflammation or discharge. Hearing is grossly normal bilaterally. Nose:     External nasal examination shows no deformity or inflammation. Nasal mucosa are pink and moist without lesions or exudates. Mouth:     Oral mucosa and  oropharynx without lesions or exudates.  Teeth in good repair. Neck:     No deformities, masses, or tenderness noted. Chest Wall:     No deformities, masses, or tenderness noted. Breasts:     No mass, nodules, thickening, tenderness, bulging, retraction, inflamation, nipple discharge or skin changes noted.  Mild sclerosis of the prior bx site of right breast. Lungs:     Normal respiratory effort, chest expands symmetrically. Lungs are clear to auscultation, no crackles or wheezes. Heart:     Normal rate and regular rhythm. S1 and S2 normal without gallop, murmur, click, rub or other extra sounds. Abdomen:     Bowel sounds positive,abdomen soft and non-tender without masses, organomegaly or hernias noted. Rectal:     No external abnormalities noted. Normal sphincter tone. No rectal masses or tenderness. G neg. Genitalia:     Pelvic Exam:        External: normal female genitalia without lesions or masses        Vagina: normal without lesions or masses        Cervix: normal without lesions or masses        Adnexa: normal bimanual exam without masses or fullness        Uterus: normal by palpation        Pap smear: performed Msk:     No deformity or scoliosis noted of thoracic or lumbar spine.   Pulses:     R and L carotid,radial,femoral,dorsalis pedis and posterior tibial pulses are full and equal bilaterally Extremities:     No clubbing, cyanosis, edema, or deformity noted with normal full range of motion of all joints.   Neurologic:     No cranial nerve deficits noted. Station and gait are normal. Plantar reflexes are down-going bilaterally. DTRs are symmetrical throughout. Sensory, motor and coordinative functions appear intact. Syptomatic dizziness with laying down and getting back up from laying down. Skin:     Intact without suspicious lesions or rashes Cervical Nodes:     No lymphadenopathy noted Axillary Nodes:     No palpable lymphadenopathy Inguinal Nodes:     No  significant adenopathy Psych:     Cognition and judgment appear intact. Alert and cooperative with normal attention span and concentration. No apparent delusions, illusions, hallucinations    Impression & Recommendations:  Problem # 1:  LABYRINTHITIS, CHRONIC (ICD-386.30) Trial of meclizine...discussed stillness and eyes closed for sxs. Is a veteran of this as she's had it many times and has even had ENT physical therapy...told to do version of those maneuvers.  Problem # 2:  FIBROMYALGIA (ICD-729.1) Assessment: Unchanged Stable for today.  Problem # 3:  HYPERGLYCEMIA 104 (ICD-790.6) Assessment: Unchanged Encouraged to continue to watch sweets and carbs. Exercise regularly, starting slowly and building up slowly.  Problem # 4:  HYPERCHOLESTEROLEMIA 212/LDL144 (ICD-272.0) Assessment: Improved Pretty good nos altho I always want them better. Her updated medication list for this problem includes:    Zetia 10 Mg Tabs (Ezetimibe) .Marland Kitchen... Take one by mouth daily  Labs Reviewed: Chol: 164 (10/20/2007)   HDL: 45.2 (10/20/2007)  LDL: 103 (10/20/2007)   TG: 80 (10/20/2007) SGOT: 17 (10/20/2007)   SGPT: 19 (10/20/2007)   Problem # 5:  CA IN SITU, BREAST (ICD-233.0) Assessment: Unchanged Mammo due and scheduled 12/12, now 7 yrs out.  Problem # 6:  HYPERTENSION (ICD-401.9) Assessment: Unchanged Good control. Her updated medication list for this problem includes:    Lisinopril 5 Mg Tabs (Lisinopril) .Marland Kitchen... Take one by mouth daily  BP today: 130/80  Labs Reviewed: Creat: 0.9 (10/20/2007) Chol: 164 (10/20/2007)   HDL: 45.2 (10/20/2007)   LDL: 103 (10/20/2007)   TG: 80 (10/20/2007)   Complete Medication List: 1)  Zetia 10 Mg Tabs (Ezetimibe) .... Take one by mouth daily 2)  Lisinopril 5 Mg Tabs (Lisinopril) .... Take one by mouth daily 3)  Protonix 40 Mg Tbec (Pantoprazole sodium) .... Take one by mouth daily 4)  Ativan 0.5 Mg Tabs (Lorazepam) .... Take one by mouth daily prn 5)   Meclizine Hcl 25 Mg Chew (Meclizine hcl) .... One tab by mouth q6 hrs as needed  Other Orders: Flu Vaccine 51yrs + ([address]) Admin 1st Vaccine ([address])   Patient Instructions: 1)  RTC as needed, o/w 1 year for Comp Exam, labs prior. 2)  Flu shot today.    Prescriptions: MECLIZINE HCL 25 MG  CHEW (MECLIZINE HCL) one tab by mouth q6 hrs as needed  #30 x 0   Entered and Authorized by:   Shaune Leeks MD   Signed by:   Shaune Leeks MD on 10/22/2007   Method used:   Print then Give to Patient   RxID:   [id number]  ]  Influenza Vaccine    Vaccine Type: Fluvax 3+    Site: left deltoid    Mfr: Sanofi Pasteur    Dose: 0.5 ml    Route: IM    Given by: Providence Crosby    Exp. Date: 05/25/2008    Lot #: [id number]    VIS given: 06/19/07 version given October 22, 2007.  Flu Vaccine Consent Questions    Do you have a history of severe allergic reactions to this vaccine? no    Any prior history of allergic reactions to egg and/or gelatin? no    Do you have a sensitivity to the preservative Thimersol? no    Do you have a past history of Guillan-Barre Syndrome? no    Do you currently have an acute febrile illness? no    Have you ever had a severe reaction to latex? no    Vaccine information given and explained to patient? yes    Are you currently pregnant? no

## 2010-12-26 NOTE — Letter (Signed)
Summary: [facility] [place] No Show Letter  [place] [name]  [address] [place], [place] [address]   Phone: [phone]  Fax: [phone]    06/08/2009 MRN: [id number]  NEVEYAH GARZON [address] [place], [place]  [address]   Dear Ms. Lorrene Reid,   Our records indicate that you missed your scheduled appointment with ___lab__________________ on ___7.14.10_________.  Please contact this office to reschedule your appointment as soon as possible.  It is important that you keep your scheduled appointments with your physician, so we can provide you the best care possible.  Please be advised that there may be a charge for "no show" appointments.    Sincerely,   [facility]

## 2011-03-12 ENCOUNTER — Encounter: Payer: Self-pay | Admitting: Family Medicine

## 2011-03-12 LAB — HM MAMMOGRAPHY

## 2011-03-12 LAB — HM PAP SMEAR

## 2011-03-13 ENCOUNTER — Other Ambulatory Visit: Payer: Self-pay | Admitting: Family Medicine

## 2011-03-13 ENCOUNTER — Encounter: Payer: Self-pay | Admitting: Family Medicine

## 2011-03-13 ENCOUNTER — Other Ambulatory Visit (INDEPENDENT_AMBULATORY_CARE_PROVIDER_SITE_OTHER): Payer: MEDICARE | Admitting: Family Medicine

## 2011-03-13 DIAGNOSIS — R739 Hyperglycemia, unspecified: Secondary | ICD-10-CM

## 2011-03-13 DIAGNOSIS — K219 Gastro-esophageal reflux disease without esophagitis: Secondary | ICD-10-CM

## 2011-03-13 DIAGNOSIS — I10 Essential (primary) hypertension: Secondary | ICD-10-CM

## 2011-03-13 DIAGNOSIS — R1013 Epigastric pain: Secondary | ICD-10-CM

## 2011-03-13 DIAGNOSIS — Z13220 Encounter for screening for lipoid disorders: Secondary | ICD-10-CM

## 2011-03-13 DIAGNOSIS — Z131 Encounter for screening for diabetes mellitus: Secondary | ICD-10-CM

## 2011-03-13 DIAGNOSIS — E7800 Pure hypercholesterolemia, unspecified: Secondary | ICD-10-CM

## 2011-03-13 DIAGNOSIS — Z0000 Encounter for general adult medical examination without abnormal findings: Secondary | ICD-10-CM

## 2011-03-13 LAB — HEPATIC FUNCTION PANEL
ALT: 22 U/L (ref 0–35)
AST: 22 U/L (ref 0–37)
Albumin: 4 g/dL (ref 3.5–5.2)
Alkaline Phosphatase: 94 U/L (ref 39–117)
Bilirubin, Direct: 0.2 mg/dL (ref 0.0–0.3)
Total Bilirubin: 1 mg/dL (ref 0.3–1.2)
Total Protein: 6.9 g/dL (ref 6.0–8.3)

## 2011-03-13 LAB — CBC WITH DIFFERENTIAL/PLATELET
Basophils Absolute: 0 10*3/uL (ref 0.0–0.1)
Basophils Relative: 0.4 % (ref 0.0–3.0)
Eosinophils Absolute: 0.2 10*3/uL (ref 0.0–0.7)
Eosinophils Relative: 2.7 % (ref 0.0–5.0)
HCT: 41.5 % (ref 36.0–46.0)
Hemoglobin: 14.2 g/dL (ref 12.0–15.0)
Lymphocytes Relative: 28.8 % (ref 12.0–46.0)
Lymphs Abs: 1.7 10*3/uL (ref 0.7–4.0)
MCHC: 34.2 g/dL (ref 30.0–36.0)
MCV: 88.3 fl (ref 78.0–100.0)
Monocytes Absolute: 0.5 10*3/uL (ref 0.1–1.0)
Monocytes Relative: 8.3 % (ref 3.0–12.0)
Neutro Abs: 3.4 10*3/uL (ref 1.4–7.7)
Neutrophils Relative %: 59.8 % (ref 43.0–77.0)
Platelets: 215 10*3/uL (ref 150.0–400.0)
RBC: 4.71 Mil/uL (ref 3.87–5.11)
RDW: 12.7 % (ref 11.5–14.6)
WBC: 5.8 10*3/uL (ref 4.5–10.5)

## 2011-03-13 LAB — BASIC METABOLIC PANEL
BUN: 18 mg/dL (ref 6–23)
CO2: 28 mEq/L (ref 19–32)
Calcium: 9.3 mg/dL (ref 8.4–10.5)
Chloride: 107 mEq/L (ref 96–112)
Creatinine, Ser: 0.9 mg/dL (ref 0.4–1.2)
GFR: 63.87 mL/min (ref >60.00)
Glucose, Bld: 91 mg/dL (ref 70–99)
Potassium: 4.6 mEq/L (ref 3.5–5.1)
Sodium: 142 mEq/L (ref 135–145)

## 2011-03-13 LAB — LIPID PANEL
Cholesterol: 223 mg/dL — ABNORMAL HIGH (ref 0–200)
HDL: 47.9 mg/dL (ref >39.00)
Total CHOL/HDL Ratio: 5
Triglycerides: 112 mg/dL (ref 0.0–149.0)
VLDL: 22.4 mg/dL (ref 0.0–40.0)

## 2011-03-13 LAB — TSH: TSH: 3.57 u[IU]/mL (ref 0.35–5.50)

## 2011-03-13 LAB — LDL CHOLESTEROL, DIRECT: Direct LDL: 165.8 mg/dL

## 2011-03-15 ENCOUNTER — Ambulatory Visit (INDEPENDENT_AMBULATORY_CARE_PROVIDER_SITE_OTHER): Payer: MEDICARE | Admitting: Family Medicine

## 2011-03-15 ENCOUNTER — Other Ambulatory Visit (HOSPITAL_COMMUNITY)
Admission: RE | Admit: 2011-03-15 | Discharge: 2011-03-15 | Disposition: A | Discharge status: Home / Self Care | Payer: Medicare Other | Source: Ambulatory Visit | Attending: Family Medicine | Admitting: Family Medicine

## 2011-03-15 ENCOUNTER — Encounter: Payer: Self-pay | Admitting: Family Medicine

## 2011-03-15 VITALS — BP 120/80 | HR 72 | Temp 97.7°F | Ht 66.0 in | Wt 205.8 lb

## 2011-03-15 DIAGNOSIS — Z23 Encounter for immunization: Secondary | ICD-10-CM

## 2011-03-15 DIAGNOSIS — Z1211 Encounter for screening for malignant neoplasm of colon: Secondary | ICD-10-CM

## 2011-03-15 DIAGNOSIS — Z01419 Encounter for gynecological examination (general) (routine) without abnormal findings: Secondary | ICD-10-CM | POA: Insufficient documentation

## 2011-03-15 DIAGNOSIS — Z124 Encounter for screening for malignant neoplasm of cervix: Secondary | ICD-10-CM

## 2011-03-15 DIAGNOSIS — Z0000 Encounter for general adult medical examination without abnormal findings: Secondary | ICD-10-CM

## 2011-03-15 DIAGNOSIS — Z1159 Encounter for screening for other viral diseases: Secondary | ICD-10-CM | POA: Insufficient documentation

## 2011-03-15 MED ORDER — EZETIMIBE 10 MG PO TABS: 10.0000 mg | ORAL_TABLET | Freq: Every day | ORAL | Status: DC

## 2011-03-15 NOTE — Patient Instructions (Signed)
Please stop by to see [name] on your way out. Please come in to have your labs rechecked in 3 months - lipid panel, hepatic panel (272.4)

## 2011-03-15 NOTE — Progress Notes (Signed)
Addended by: Linde Gillis on: 03/15/2011 10:19 AM   Modules accepted: Orders

## 2011-03-15 NOTE — Progress Notes (Signed)
I have personally reviewed the Medicare Annual Wellness questionnaire and have noted 1. The patient's medical and social history HLD-  Overall lipid panel ok but LDL increased to 165 from 161.  Stopped taking her Zetia last year because thought she didn't need it. She had no side effects and ok with restarting it.  2. Their use of alcohol, tobacco or illicit drugs 3. Their current medications and supplements 4. The patient's functional ability including ADL's, fall risks, home safety risks and hearing or visual             impairment. 5. Diet and physical activities- trying to become more physically active. 6. Evidence for depression or mood disorders- none.  The PMH, PSH, Social History, Family History, Medications, and allergies have been reviewed in [facility], and have been updated if relevant. ROS: General: Denies fever, chills, sweats. No significant weight loss. Eyes: Denies blurring,significant itching ENT: Denies earache, sore throat, and hoarseness.  Cardiovascular: Denies chest pains, palpitations, dyspnea on exertion,  Respiratory: Denies cough, dyspnea at rest,wheeezing Breast: no concerns about lumps GI: Denies nausea, vomiting, diarrhea, constipation, change in bowel habits, abdominal pain, melena, hematochezia GU: Denies dysuria, hematuria, urinary hesitancy, nocturia, denies STD risk, no concerns about discharge Musculoskeletal: Denies back pain, joint pain Derm: Denies rash, itching Neuro: Denies  paresthesias, frequent falls, frequent headaches Psych: Denies depression, anxiety Endocrine: Denies cold intolerance, heat intolerance, polydipsia Heme: Denies enlarged lymph nodes Allergy: No hayfever  Physical Exam: BP 120/80  Pulse 72  Temp(Src) 97.7 F (36.5 C) (Oral)  Ht 5\' 6"  (1.676 m)  Wt 205 lb 12.8 oz (93.35 kg)  BMI 33.22 kg/m2  General:  Well-developed,well-nourished,in no acute distress; alert,appropriate and cooperative throughout examination Head:   normocephalic and atraumatic.   Eyes:  vision grossly intact, pupils equal, pupils round, and pupils reactive to light.   Ears:  R ear normal and L ear normal.   Nose:  no external deformity.   Mouth:  good dentition.   Breasts:  No mass, nodules, thickening, tenderness, bulging, retraction, inflamation, nipple discharge or skin changes noted.   Lungs:  Normal respiratory effort, chest expands symmetrically. Lungs are clear to auscultation, no crackles or wheezes. Heart:  Normal rate and regular rhythm. S1 and S2 normal without gallop, murmur, click, rub or other extra sounds. Abdomen:  Bowel sounds positive,abdomen soft and non-tender without masses, organomegaly or hernias noted. Rectal:  no external abnormalities.   Genitalia:  Pelvic Exam:        External: normal female genitalia without lesions or masses        Vagina: normal without lesions or masses        Cervix: normal without lesions or masses        Adnexa: normal bimanual exam without masses or fullness        Uterus: normal by palpation        Pap smear: performed Extremities:  No clubbing, cyanosis, edema, or deformity noted with normal full range of motion of all joints.   Neurologic:  alert & oriented X3 and gait normal.   Skin:  Intact without suspicious lesions or rashes Psych:  Cognition and judgment appear intact. Alert and cooperative with normal attention span and concentration. No apparent delusions, illusions, hallucinations

## 2011-03-16 ENCOUNTER — Telehealth: Payer: Self-pay | Admitting: *Deleted

## 2011-03-16 MED ORDER — ATORVASTATIN CALCIUM 40 MG PO TABS: 40.0000 mg | ORAL_TABLET | Freq: Every day | ORAL | Status: DC

## 2011-03-16 NOTE — Telephone Encounter (Signed)
Patient advised via message left on machine at home number.

## 2011-03-16 NOTE — Telephone Encounter (Signed)
Patient came by the office today and wanted to know if she could switch from Zetia to a cheaper medication like Lipitor.  Please advise.  Medication tier sheet in your inbox.

## 2011-03-16 NOTE — Telephone Encounter (Signed)
Yes, lipitor sent to her pharmacy.

## 2011-03-20 ENCOUNTER — Other Ambulatory Visit: Payer: Self-pay | Admitting: Family Medicine

## 2011-03-20 DIAGNOSIS — Z1211 Encounter for screening for malignant neoplasm of colon: Secondary | ICD-10-CM

## 2011-03-22 ENCOUNTER — Encounter: Payer: Self-pay | Admitting: *Deleted

## 2011-04-04 ENCOUNTER — Ambulatory Visit: Payer: MEDICARE | Admitting: Internal Medicine

## 2011-04-13 NOTE — Procedures (Signed)
[facility]  Patient:    [name], [name] Visit Number: [id number] MRN: [id number]          Service Type: END Location: ENDO Attending Physician:  Mervin Hack Dictated by:   Hedwig Morton. Juanda Chance, M.D. [facility] Proc. Date: 09/23/01 Admit Date:  09/23/2001                             Procedure Report  PROCEDURE:  Colonoscopy.  SURGEON:  Hedwig Morton. Juanda Chance, M.D.  INDICATIONS:  This is a 70 year old white female who has had left upper quadrant abdominal pain, epigastric pain, and nausea.  She also has a positive family history of colon cancer in her grandmother.  CT scan of the abdomen was negative.  She has not responded to PPIs or antispasmodics.  She has a sister with breast CA.  She is undergoing colonoscopy for evaluation of left upper quadrant abdominal pain.  ENDOSCOPE:  Olympus single channel videoscope.  SEDATION:  Versed 9 mg IV and Demerol 75 mg IV.  FINDINGS:  The Olympus single channel videoscope was passed under direct vision through the rectum to sigmoid colon.  The patient was monitored by pulse oximeter and oxygen saturations were normal.  Anal canal and rectal ampulla was unremarkable.  The sigmoid colon was normal, somewhat tortuous, but there were no diverticula.  Prep was excellent.  Mucosa appeared normal throughout the descending colon, splenic flexure, transverse colon, hepatic flexure, ascending colon to the cecum.  The cecal pouch and ileocecal valve were unremarkable.  Video photographs of the cecal pouch were obtained.  The colonoscope was then retracted and the colon decompressed.  The patient tolerated the procedure well.  IMPRESSION:  Normal colonoscopy to the cecum.  PLAN:  Continue Robinul 2 mg p.r.n. abdominal pain which is most likely due to irritable bowel syndrome.  We will also discuss possibly upper endoscopy because of the patients upper GI symptoms. Dictated by:   Hedwig Morton. Juanda Chance, M.D. [facility] Attending Physician:   Mervin Hack DD:  09/23/01 TD:  09/24/01 Job: [id number]

## 2011-04-24 ENCOUNTER — Other Ambulatory Visit: Payer: Medicare Other

## 2011-05-03 ENCOUNTER — Other Ambulatory Visit: Payer: Self-pay | Admitting: Family Medicine

## 2011-05-03 MED ORDER — LORAZEPAM 1 MG PO TABS: ORAL_TABLET | ORAL | Status: DC

## 2011-05-04 NOTE — Telephone Encounter (Signed)
Lorazepam called to [facility] [address]

## 2011-05-10 ENCOUNTER — Ambulatory Visit (INDEPENDENT_AMBULATORY_CARE_PROVIDER_SITE_OTHER): Payer: Medicare Other | Admitting: Internal Medicine

## 2011-05-10 ENCOUNTER — Encounter: Payer: Self-pay | Admitting: Internal Medicine

## 2011-05-10 ENCOUNTER — Other Ambulatory Visit (INDEPENDENT_AMBULATORY_CARE_PROVIDER_SITE_OTHER): Payer: Medicare Other

## 2011-05-10 DIAGNOSIS — Z1211 Encounter for screening for malignant neoplasm of colon: Secondary | ICD-10-CM

## 2011-05-10 DIAGNOSIS — R1012 Left upper quadrant pain: Secondary | ICD-10-CM

## 2011-05-10 DIAGNOSIS — R197 Diarrhea, unspecified: Secondary | ICD-10-CM

## 2011-05-10 LAB — COMPREHENSIVE METABOLIC PANEL
ALT: 29 U/L (ref 0–35)
AST: 23 U/L (ref 0–37)
Albumin: 4.5 g/dL (ref 3.5–5.2)
Alkaline Phosphatase: 101 U/L (ref 39–117)
BUN: 16 mg/dL (ref 6–23)
CO2: 30 mEq/L (ref 19–32)
Calcium: 9.4 mg/dL (ref 8.4–10.5)
Chloride: 107 mEq/L (ref 96–112)
Creatinine, Ser: 0.8 mg/dL (ref 0.4–1.2)
GFR: 73.95 mL/min (ref >60.00)
Glucose, Bld: 102 mg/dL — ABNORMAL HIGH (ref 70–99)
Potassium: 4.8 mEq/L (ref 3.5–5.1)
Sodium: 142 mEq/L (ref 135–145)
Total Bilirubin: 0.9 mg/dL (ref 0.3–1.2)
Total Protein: 7.5 g/dL (ref 6.0–8.3)

## 2011-05-10 LAB — CBC WITH DIFFERENTIAL/PLATELET
Basophils Absolute: 0 10*3/uL (ref 0.0–0.1)
Basophils Relative: 0.6 % (ref 0.0–3.0)
Eosinophils Absolute: 0.2 10*3/uL (ref 0.0–0.7)
Eosinophils Relative: 2.8 % (ref 0.0–5.0)
HCT: 41.9 % (ref 36.0–46.0)
Hemoglobin: 14.4 g/dL (ref 12.0–15.0)
Lymphocytes Relative: 26.6 % (ref 12.0–46.0)
Lymphs Abs: 1.7 10*3/uL (ref 0.7–4.0)
MCHC: 34.4 g/dL (ref 30.0–36.0)
MCV: 89.5 fl (ref 78.0–100.0)
Monocytes Absolute: 0.4 10*3/uL (ref 0.1–1.0)
Monocytes Relative: 6.7 % (ref 3.0–12.0)
Neutro Abs: 4 10*3/uL (ref 1.4–7.7)
Neutrophils Relative %: 63.3 % (ref 43.0–77.0)
Platelets: 237 10*3/uL (ref 150.0–400.0)
RBC: 4.68 Mil/uL (ref 3.87–5.11)
RDW: 12.9 % (ref 11.5–14.6)
WBC: 6.3 10*3/uL (ref 4.5–10.5)

## 2011-05-10 LAB — LIPASE: Lipase: 32 U/L (ref 11.0–59.0)

## 2011-05-10 LAB — AMYLASE: Amylase: 55 U/L (ref 27–131)

## 2011-05-10 MED ORDER — DICYCLOMINE HCL 20 MG PO TABS: 20.0000 mg | ORAL_TABLET | Freq: Two times a day (BID) | ORAL | Status: DC

## 2011-05-10 MED ORDER — PEG-KCL-NACL-NASULF-NA ASC-C 100 G PO SOLR: 1.0000 | Freq: Once | ORAL | Status: DC

## 2011-05-10 NOTE — Patient Instructions (Signed)
You have been scheduled for a colonoscopy. Please follow written instructions given to you at your visit today.  Please pick up your Moviprep kit at the pharmacy within the next 2-3 days. We have sent Bentyl to your pharmacy for you to take twice daily. We have given you samples of Aciphex to take once daily as needed. Your physician has requested that you go to the basement for the following lab work before leaving today: Amylase, Lipase, TtG, CMET, CBC CC: Dr T.Dayton Martes

## 2011-05-10 NOTE — Progress Notes (Signed)
[name] [birth year] MRN [id number]        History of Present Illness:  This is a 70 year old white female with episode of left upper quadrant abdominal pain, occasional diarrhea, bloating and some back pain. She has had chronic abdominal pain intermittently for several years and had evaluation in 2010  With  normal CT scan of the abdomen . She also had upper endoscopy showing l gastritis and  2-3 cm hiatal hernia. She has a personal history of breast cancer. Last  colonoscopy in 2002 for left upper quadrant abdominal pain was essentially normal. She has been taking Pepcid with partial relief of her dyspepsia    Past Medical History  Diagnosis Date  . Fatty liver   . Abdominal pain, epigastric   . Esophageal reflux   . Myalgia and myositis, unspecified   . Other abnormal blood chemistry   . Pure hypercholesterolemia   . Carcinoma in situ of breast   . Labyrinthitis, unspecified   . Urinary tract infection, site not specified   . Unspecified essential hypertension   . Cataract 1988, 1990    [facility]  . Fibromyalgia    Past Surgical History  Procedure Date  . Foot surgery 1980    minor   . Breast biopsy 07/2000    positive right side, [facility]   . Breast lumpectomy 08/20/2000    right breast, malignant stage I, [facility]  . Cataract extraction 1988, 1990    reports that she has never smoked. She does not have any smokeless tobacco history on file. She reports that she does not drink alcohol or use illicit drugs. family history includes Alzheimer's disease in her father; Heart disease in her brother; Pancreatitis in her brother; Parkinsonism in her mother; and Prostate cancer in her brothers. Allergies  Allergen Reactions  . Metoprolol Succinate     REACTION: Exhaustion, increased dizziness  . Nitrofurantoin     REACTION: Rash  . Sulfadiazine     REACTION: Rash        Review of Systems: Denies dysphagia, odynophagia shortness or breath or chest pain positive for  occasional diarrhea  The remainder of the 10  point ROS is negative except as outlined in H&P   Physical Exam: General appearance  Well developed in no distress Eyes- non icteric HEENT nontraumatic, normocephalic Mouth no lesions, tongue papillated, no cheilosis Neck supple without adenopathy, thyroid not enlarged, no carotid bruits, no JVD Lungs Clear to auscultation bilaterally Cor normal S1 normal S2, regular rhythm , no murmur,  quiet precordium Abdomen mildly protuberant abdomen soft nontender with normoactive bowel sounds. No palpable mass. No fullness Rectal: Soft Hemoccult negative stool Extremities no pedal edema Skin no lesions Neurological alert and oriented x3 Psychological normal mood and  affect  Assessment and Plan:  Chronic intermittent left upper quadrant abdominal pain almost certainly consistent with the irritable bowel syndrome. Nonspecific dyspepsia may be related to functional problems. Pepcid seemed to help. We will start AcipHex 20 mg at bedtime. She is due for recall colonoscopy. We will also obtain tissue transglutaminase, amylase, lipase, blood count and metabolic panel. She will be started on Bentyl 20 mg twice a day   05/10/2011 [name]

## 2011-05-11 ENCOUNTER — Encounter: Payer: Self-pay | Admitting: Internal Medicine

## 2011-05-11 ENCOUNTER — Telehealth: Payer: Self-pay | Admitting: *Deleted

## 2011-05-11 LAB — TISSUE TRANSGLUTAMINASE, IGA: Tissue Transglutaminase Ab, IgA: 13.2 U/mL (ref <20)

## 2011-05-11 NOTE — Telephone Encounter (Signed)
Patient notified of results as per Dr. Brodie. 

## 2011-05-11 NOTE — Telephone Encounter (Signed)
Message copied by Daphine Deutscher on Fri May 11, 2011  9:17 AM ------      Message from: Hart Carwin      Created: Thu May 10, 2011  5:47 PM       Please call pt with normal results

## 2011-05-14 ENCOUNTER — Telehealth: Payer: Self-pay | Admitting: *Deleted

## 2011-05-14 ENCOUNTER — Encounter: Payer: Self-pay | Admitting: *Deleted

## 2011-05-14 NOTE — Telephone Encounter (Signed)
Patient notified of results as per Dr. Brodie. 

## 2011-05-14 NOTE — Telephone Encounter (Signed)
Message copied by Daphine Deutscher on Mon May 14, 2011  8:36 AM ------      Message from: Hart Carwin      Created: Fri May 11, 2011  5:06 PM       Please call pt with normal lab results

## 2011-05-16 NOTE — Telephone Encounter (Signed)
Error

## 2011-06-26 ENCOUNTER — Other Ambulatory Visit: Payer: Medicare Other | Admitting: Internal Medicine

## 2011-07-06 ENCOUNTER — Telehealth: Payer: Self-pay | Admitting: Internal Medicine

## 2011-07-06 MED ORDER — RABEPRAZOLE SODIUM 20 MG PO TBEC: 20.0000 mg | DELAYED_RELEASE_TABLET | Freq: Every day | ORAL | Status: DC

## 2011-07-06 NOTE — Telephone Encounter (Signed)
Advised pt that I would send an rx to her pharmacy when it is denied by her insurance company we will call them about doing a prior auth. I offered samples but she declined at this time. Will call back if she needs some.

## 2011-07-09 ENCOUNTER — Telehealth: Payer: Self-pay | Admitting: *Deleted

## 2011-07-09 NOTE — Telephone Encounter (Signed)
Patient states that she has tried prilosec OTC before with no relief of symptoms. She has also tried Protonix, Pepcid, Prevacid and Nexium in the past with only partial or temporary relief. I will send prior auth request to her insurance company.

## 2011-07-09 NOTE — Telephone Encounter (Signed)
Message copied by Richardson Chiquito on Mon Jul 09, 2011  2:58 PM ------      Message from: Tawni Levy I      Created: Mon Jul 09, 2011  2:49 PM       [place] and wanted to let you know that her Aciphex was approve effective today.

## 2011-08-02 ENCOUNTER — Other Ambulatory Visit: Payer: Medicare Other | Admitting: Internal Medicine

## 2011-10-15 ENCOUNTER — Other Ambulatory Visit: Payer: Self-pay | Admitting: *Deleted

## 2011-10-15 MED ORDER — LORAZEPAM 1 MG PO TABS: ORAL_TABLET | ORAL | Status: DC

## 2011-10-16 NOTE — Telephone Encounter (Signed)
[facility] 

## 2011-11-01 ENCOUNTER — Encounter: Payer: Self-pay | Admitting: Internal Medicine

## 2012-04-16 ENCOUNTER — Ambulatory Visit (INDEPENDENT_AMBULATORY_CARE_PROVIDER_SITE_OTHER): Payer: Medicare Other | Admitting: Internal Medicine

## 2012-04-16 ENCOUNTER — Encounter: Payer: Self-pay | Admitting: Internal Medicine

## 2012-04-16 VITALS — BP 128/72 | HR 64 | Ht 66.0 in | Wt 210.0 lb

## 2012-04-16 DIAGNOSIS — R1012 Left upper quadrant pain: Secondary | ICD-10-CM

## 2012-04-16 MED ORDER — OMEPRAZOLE 40 MG PO CPDR: 40.0000 mg | DELAYED_RELEASE_CAPSULE | Freq: Every day | ORAL | Status: DC

## 2012-04-16 MED ORDER — AMOXICILLIN 250 MG PO CAPS: 250.0000 mg | ORAL_CAPSULE | Freq: Three times a day (TID) | ORAL | Status: AC

## 2012-04-16 NOTE — Progress Notes (Signed)
[name] [birth year] MRN [id number]   History of Present Illness:  This is a 71 year old white female with chronic intermittent left upper quadrant abdominal discomfort under the left rib cage sometimes associated with bloating and indigestion. She was evaluated for this in 2010 when she had a normal CT scan of the abdomen. Her upper endoscopy showed a 2-3 cm hiatal hernia. She was on AcipHex for a while but discontinued it and only recently started it again but it is too expensive. She has noticed improvement of her bloating with AcipHex. She had a colonoscopy in 2002 which was a normal exam. She was scheduled for a recall colonoscopy last June but developed TMJ arthritis and was unable to keep her appointment. An upper abdominal ultrasound in May 2010 showed fatty liver. She describes left upper quadrant pain being relieved by having a bowel movement. She denies constipation or rectal bleeding. There is no family history of colon cancer.   Past Medical History  Diagnosis Date  . Fatty liver   . Abdominal pain, chronic, epigastric   . Esophageal reflux   . Myalgia and myositis, unspecified   . Other abnormal blood chemistry   . Pure hypercholesterolemia   . Carcinoma in situ of breast   . Labyrinthitis, unspecified   . Urinary tract infection, site not specified   . Unspecified essential hypertension   . Cataract 1988, 1990    [facility]  . Fibromyalgia    Past Surgical History  Procedure Date  . Foot surgery 1980    minor   . Breast biopsy 07/2000    positive right side, [facility]   . Breast lumpectomy 08/20/2000    right breast, malignant stage I, [facility]  . Cataract extraction 1988, 1990    reports that she has never smoked. She has never used smokeless tobacco. She reports that she does not drink alcohol or use illicit drugs. family history includes Alzheimer's disease in her father; Heart disease in her brother; Pancreatitis in her brother; Parkinsonism in her mother; and  Prostate cancer in her brothers.  There is no history of Colon cancer. Allergies  Allergen Reactions  . Metoprolol Succinate     REACTION: Exhaustion, increased dizziness  . Nitrofurantoin     REACTION: Rash  . Sulfadiazine     REACTION: Rash        Review of Systems: Occasional heartburn bloating gas,  The remainder of the 10 point ROS is negative except as outlined in H&P   Physical Exam: General appearance  Well developed, in no distress. Eyes- non icteric. HEENT nontraumatic, normocephalic. Mouth no lesions, tongue papillated, no cheilosis. Neck supple without adenopathy, thyroid not enlarged, no carotid bruits, no JVD. Lungs Clear to auscultation bilaterally. Cor normal S1, normal S2, regular rhythm, no murmur,  quiet precordium. Abdomen: Soft mildly obese abdomen with normal active bowel sounds. Mild tenderness along costal margin extending to the left middle quadrant. No distention and no palpable mass. Rectal: Not done. Extremities no pedal edema. Skin no lesions. Neurological alert and oriented x 3. Psychological normal mood and affect.  Assessment and Plan:  Problem #1 Chronic dyspepsia and left-sided abdominal pain suggestive of splenic flexure syndrome or possibly irritable bowel syndrome. Prior studies did not show any definite structural abnormality. 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 I have asked her to limit her fat intake. We will schedule her for  a colonoscopy. We will also switch her from AcipHex to Prilosec 40 mg a day which is more cost effective. Problem #2, recurrent UTI, dysuria- will'll  give Amoxacillin 250 mg, po tid x 1 week  04/16/2012 [name]

## 2012-04-16 NOTE — Patient Instructions (Signed)
You have been scheduled for a colonoscopy with propofol. Please follow written instructions given to you at your visit today.  We have sent the following medications to your pharmacy for you to pick up at your convenience: Amoxicillin 250 mg three times daily Prilosec 40 mg once daily (take in place of Aciphex) CC: Dr [name] Givens

## 2012-04-22 ENCOUNTER — Telehealth: Payer: Self-pay | Admitting: Internal Medicine

## 2012-04-22 NOTE — Telephone Encounter (Signed)
Septra 1 po bid, #14,

## 2012-04-23 MED ORDER — CIPROFLOXACIN HCL 250 MG PO TABS: 250.0000 mg | ORAL_TABLET | Freq: Two times a day (BID) | ORAL | Status: DC

## 2012-04-23 NOTE — Telephone Encounter (Signed)
Patient states that the amoxicillin did not clear up her symptoms. I have spoken to Dr Juanda Chance and she would actually prefer patient take Cipro 250 mg twice daily x 7 days rather than septra (since patient is allergic to sulfadiazine). Rx has been sent to patient's pharmacy and patient has been advised. I have also advised patient that if Cipro does not eliminate her symptoms, she should follow up with her PCP for further evaluation.

## 2012-04-29 ENCOUNTER — Ambulatory Visit (INDEPENDENT_AMBULATORY_CARE_PROVIDER_SITE_OTHER): Payer: Medicare Other | Admitting: Family Medicine

## 2012-04-29 ENCOUNTER — Encounter: Payer: Self-pay | Admitting: Family Medicine

## 2012-04-29 VITALS — BP 154/86 | HR 74 | Temp 98.1°F | Wt 212.0 lb

## 2012-04-29 DIAGNOSIS — N390 Urinary tract infection, site not specified: Secondary | ICD-10-CM

## 2012-04-29 DIAGNOSIS — R300 Dysuria: Secondary | ICD-10-CM

## 2012-04-29 DIAGNOSIS — M549 Dorsalgia, unspecified: Secondary | ICD-10-CM

## 2012-04-29 LAB — POCT URINALYSIS DIPSTICK
Bilirubin, UA: NEGATIVE
Blood, UA: NEGATIVE
Glucose, UA: NEGATIVE
Ketones, UA: NEGATIVE
Leukocytes, UA: NEGATIVE
Nitrite, UA: NEGATIVE
Protein, UA: NEGATIVE
Spec Grav, UA: <=1.005
Urobilinogen, UA: NEGATIVE
pH, UA: 6.5

## 2012-04-29 MED ORDER — OXYBUTYNIN CHLORIDE 5 MG PO TABS: 5.0000 mg | ORAL_TABLET | Freq: Two times a day (BID) | ORAL | Status: DC

## 2012-04-29 MED ORDER — PHENAZOPYRIDINE HCL 97.2 MG PO TABS: 97.0000 mg | ORAL_TABLET | Freq: Three times a day (TID) | ORAL | Status: AC | PRN

## 2012-04-29 NOTE — Patient Instructions (Signed)
Take pyridium and ditropan for the next few days and we'll contact you with your lab report.  I wouldn't start any antibiotics now.

## 2012-04-29 NOTE — Progress Notes (Signed)
Dysuria:yes, intermittent discomfort and burning.  duration of symptoms: at least 2 weeks abdominal pain: not now, but occ with some prev lower abd pain fevers:no back pain: some, at baseline, episodic and bilateral Vomiting: no Had tolerated Noroxin prev for UTI.  She was recently on amoxil and then cipro.   Meds, vitals, and allergies reviewed.   ROS: See HPI.  Otherwise negative.    GEN: nad, alert and oriented HEENT: mucous membranes moist NECK: supple CV: rrr.  PULM: ctab, no inc wob ABD: soft, +bs, suprapubic area not tender EXT: no edema SKIN: no acute rash BACK: no CVA pain  ua negative, ucx pending.

## 2012-04-29 NOTE — Assessment & Plan Note (Signed)
Possible dx.  S/p amoxil and cipro. . Hold on abx for now, start pyridium and ditropan to see if she responds.  Check ucx in meantime.  If not improved and ucx is neg, then she may need uro eval.  Nontoxic, okay for outpatient f/u.

## 2012-04-30 LAB — URINE CULTURE: Colony Count: >=100000

## 2012-05-15 ENCOUNTER — Other Ambulatory Visit: Payer: Self-pay | Admitting: Family Medicine

## 2012-05-15 DIAGNOSIS — I10 Essential (primary) hypertension: Secondary | ICD-10-CM

## 2012-05-18 ENCOUNTER — Telehealth: Payer: Self-pay | Admitting: Family Medicine

## 2012-05-18 DIAGNOSIS — I10 Essential (primary) hypertension: Secondary | ICD-10-CM

## 2012-05-18 NOTE — Telephone Encounter (Signed)
Message copied by Excell Seltzer on Sun May 18, 2012 11:19 PM ------      Message from: Baldomero Lamy      Created: Thu May 15, 2012 12:22 PM      Regarding: cpx labs  Tues 6/25       Please order  future cpx labs for pt's upcomming lab appt.      Thanks      Rodney Booze

## 2012-05-18 NOTE — Telephone Encounter (Signed)
This is not my patient.. Dr. Para March to enter orders  believe

## 2012-05-19 ENCOUNTER — Other Ambulatory Visit: Payer: Self-pay | Admitting: Family Medicine

## 2012-05-19 DIAGNOSIS — I10 Essential (primary) hypertension: Secondary | ICD-10-CM

## 2012-05-22 ENCOUNTER — Other Ambulatory Visit (INDEPENDENT_AMBULATORY_CARE_PROVIDER_SITE_OTHER): Payer: Medicare Other

## 2012-05-22 DIAGNOSIS — I10 Essential (primary) hypertension: Secondary | ICD-10-CM

## 2012-05-22 LAB — COMPREHENSIVE METABOLIC PANEL
ALT: 23 U/L (ref 0–35)
AST: 23 U/L (ref 0–37)
Albumin: 4.2 g/dL (ref 3.5–5.2)
Alkaline Phosphatase: 93 U/L (ref 39–117)
BUN: 14 mg/dL (ref 6–23)
CO2: 28 mEq/L (ref 19–32)
Calcium: 9.4 mg/dL (ref 8.4–10.5)
Chloride: 107 mEq/L (ref 96–112)
Creatinine, Ser: 1 mg/dL (ref 0.4–1.2)
GFR: 59.18 mL/min — ABNORMAL LOW (ref >60.00)
Glucose, Bld: 110 mg/dL — ABNORMAL HIGH (ref 70–99)
Potassium: 5.1 mEq/L (ref 3.5–5.1)
Sodium: 141 mEq/L (ref 135–145)
Total Bilirubin: 0.9 mg/dL (ref 0.3–1.2)
Total Protein: 7.2 g/dL (ref 6.0–8.3)

## 2012-05-22 LAB — LIPID PANEL
Cholesterol: 219 mg/dL — ABNORMAL HIGH (ref 0–200)
HDL: 48.3 mg/dL (ref >39.00)
Total CHOL/HDL Ratio: 5
Triglycerides: 94 mg/dL (ref 0.0–149.0)
VLDL: 18.8 mg/dL (ref 0.0–40.0)

## 2012-05-22 LAB — LDL CHOLESTEROL, DIRECT: Direct LDL: 147.8 mg/dL

## 2012-05-26 ENCOUNTER — Encounter: Payer: Self-pay | Admitting: Family Medicine

## 2012-05-26 ENCOUNTER — Ambulatory Visit (INDEPENDENT_AMBULATORY_CARE_PROVIDER_SITE_OTHER): Payer: Medicare Other | Admitting: Family Medicine

## 2012-05-26 VITALS — BP 134/84 | HR 75 | Temp 97.5°F | Ht 66.0 in | Wt 210.8 lb

## 2012-05-26 DIAGNOSIS — Z780 Asymptomatic menopausal state: Secondary | ICD-10-CM

## 2012-05-26 DIAGNOSIS — Z0000 Encounter for general adult medical examination without abnormal findings: Secondary | ICD-10-CM | POA: Insufficient documentation

## 2012-05-26 DIAGNOSIS — I10 Essential (primary) hypertension: Secondary | ICD-10-CM

## 2012-05-26 DIAGNOSIS — N390 Urinary tract infection, site not specified: Secondary | ICD-10-CM

## 2012-05-26 DIAGNOSIS — Z23 Encounter for immunization: Secondary | ICD-10-CM

## 2012-05-26 NOTE — Patient Instructions (Addendum)
See Shirlee Limerick about your referral before you leave today. I would get a flu shot each fall.   Take care.  If your lower abdominal symptoms continue (without a cause seen on the colonoscopy), then notify us so we can set you up with urology.

## 2012-05-26 NOTE — Assessment & Plan Note (Signed)
Appears to have cleared at this point.  Would refer to to uro if she continues to have lower abd pain after colonoscopy and GI eval.

## 2012-05-26 NOTE — Assessment & Plan Note (Signed)
Controlled, continue current meds.   

## 2012-05-26 NOTE — Progress Notes (Signed)
Last Eye Exam 11/11.  [name], CMA  I have personally reviewed the Medicare Annual Wellness questionnaire and have noted 1. The patient's medical and social history 2. Their use of alcohol, tobacco or illicit drugs 3. Their current medications and supplements 4. The patient's functional ability including ADL's, fall risks, home safety risks and hearing or visual             impairment. 5. Diet and physical activities 6. Evidence for depression or mood disorders  The patients weight, height, BMI have been recorded in the chart and visual acuity is per eye clinic.  I have made referrals, counseling and provided education to the patient based review of the above and I have provided the pt with a written personalized care plan for preventive services.  See scanned forms.  Routine anticipatory guidance given to patient.  See health maintenance. Tetanus 2008 Flu shot yearly Shingles 2010 PNA 2000, repeat 2013 Colonoscopy pending  Breast cancer screening discussed. 11/2011, normal per patient.  Breast exam done at [facility].   Cervical cancer screening.  No h/o abnormal paps in last 20 years.  No indication for pap now.  Discussed.  Advance directive.  Discussed.  "I would want my husband and daughter to make decisions" if needed.  Advised patient to at minimum discuss with family about her wishes.   DXA d/w pt.  Will refer patient.    Hypertension:    Using medication without problems or lightheadedness: yes Chest pain with exertion:no Edema:no Short of breath:no  Abdominal and back pain.  Intermittent.  Waking with GI upset in AM. "It takes half a day to get feeling normal."  Occ abd pain in lower abd.  Some urinary incontinence.  H/o fibromyalgia, but this was an unclear diagnosis.  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 "I was told I would have it in my back, but I don't know if that is related to my back."  Colonoscopy is pending. Prev dysuria is improved since last OV.   PMH and SH reviewed  Meds,  vitals, and allergies reviewed.   ROS: See HPI.  Otherwise negative.    GEN: nad, alert and oriented HEENT: mucous membranes moist NECK: supple w/o LA CV: rrr. PULM: ctab, no inc wob ABD: soft, +bs, not ttp EXT: no edema SKIN: no acute rash

## 2012-05-28 ENCOUNTER — Encounter: Payer: Medicare Other | Admitting: Internal Medicine

## 2012-05-30 ENCOUNTER — Encounter: Payer: Self-pay | Admitting: Internal Medicine

## 2012-05-30 ENCOUNTER — Ambulatory Visit (AMBULATORY_SURGERY_CENTER): Payer: Medicare Other | Admitting: Internal Medicine

## 2012-05-30 VITALS — BP 154/85 | HR 79 | Temp 97.7°F | Resp 20 | Ht 66.0 in | Wt 210.0 lb

## 2012-05-30 DIAGNOSIS — Z1211 Encounter for screening for malignant neoplasm of colon: Secondary | ICD-10-CM

## 2012-05-30 DIAGNOSIS — D126 Benign neoplasm of colon, unspecified: Secondary | ICD-10-CM

## 2012-05-30 DIAGNOSIS — R1012 Left upper quadrant pain: Secondary | ICD-10-CM

## 2012-05-30 MED ORDER — RABEPRAZOLE SODIUM 20 MG PO TBEC: 20.0000 mg | DELAYED_RELEASE_TABLET | Freq: Every day | ORAL | Status: DC

## 2012-05-30 MED ORDER — SODIUM CHLORIDE 0.9 % IV SOLN: 500.0000 mL | INTRAVENOUS | Status: DC

## 2012-05-30 NOTE — Op Note (Signed)
[facility] [address] [place], [place]  [address]  COLONOSCOPY PROCEDURE REPORT  PATIENT:  [name], [name]  MR#:  [id number] BIRTHDATE:  09-12-1941, 70 yrs. old  GENDER:  female ENDOSCOPIST:  Hedwig Morton. Juanda Chance, MD REF. BY:  [name] Givens, M.D. PROCEDURE DATE:  05/30/2012 PROCEDURE:  Colonoscopy with biopsy ASA CLASS:  Class II INDICATIONS:  colorectal cancer screening, average risk GM with colon cancer last colon 2002 was normal MEDICATIONS:   MAC sedation, administered by CRNA, propofol (Diprivan) 250 mg  DESCRIPTION OF PROCEDURE:   After the risks and benefits and of the procedure were explained, informed consent was obtained. Digital rectal exam was performed and revealed no rectal masses. The LB [id number]-[id number] endoscope was introduced through the anus and advanced to the cecum, which was identified by both the appendix and ileocecal valve.  The quality of the prep was good, using MoviPrep.  The instrument was then slowly withdrawn as the colon was fully examined. <<PROCEDUREIMAGES>>  FINDINGS:  Two polyps were found. two diminutive 2 mm polyps at 70 cm The polyps were removed using cold biopsy forceps (see image3, image4, and image5).  This was otherwise a normal examination of the colon (see image6, image1, and image2).   Retroflexed views in the rectum revealed no abnormalities.    The scope was then withdrawn from the patient and the procedure completed.  COMPLICATIONS:  None ENDOSCOPIC IMPRESSION: 1) Two polyps 2) Otherwise normal examination RECOMMENDATIONS: 1) Await pathology results 2) High fiber diet.  REPEAT EXAM:  In 10 year(s) for.  recall colon may not be indicated due to age  ______________________________ Hedwig Morton. Juanda Chance, MD  CC:  n. eSIGNED:   Hedwig Morton. Granvel Proudfoot at 05/30/2012 10:14 AM  Lytle Michaels, [id number]

## 2012-05-30 NOTE — Progress Notes (Signed)
Patient did not experience any of the following events: a burn prior to discharge; a fall within the facility; wrong site/side/patient/procedure/implant event; or a hospital transfer or hospital admission upon discharge from the facility. (G8907) Patient did not have preoperative order for IV antibiotic SSI prophylaxis. (G8918)  

## 2012-05-30 NOTE — Patient Instructions (Addendum)
YOU HAD AN ENDOSCOPIC PROCEDURE TODAY AT THE [facility]: Refer to the procedure report that was given to you for any specific questions about what was found during the examination.  If the procedure report does not answer your questions, please call your gastroenterologist to clarify.  If you requested that your care partner not be given the details of your procedure findings, then the procedure report has been included in a sealed envelope for you to review at your convenience later.  YOU SHOULD EXPECT: Some feelings of bloating in the abdomen. Passage of more gas than usual.  Walking can help get rid of the air that was put into your GI tract during the procedure and reduce the bloating. If you had a lower endoscopy (such as a colonoscopy or flexible sigmoidoscopy) you may notice spotting of blood in your stool or on the toilet paper. If you underwent a bowel prep for your procedure, then you may not have a normal bowel movement for a few days.  DIET: Your first meal following the procedure should be a light meal and then it is ok to progress to your normal diet.  A half-sandwich or bowl of soup is an example of a good first meal.  Heavy or fried foods are harder to digest and may make you feel nauseous or bloated.  Likewise meals heavy in dairy and vegetables can cause extra gas to form and this can also increase the bloating.  Drink plenty of fluids but you should avoid alcoholic beverages for 24 hours.  ACTIVITY: Your care partner should take you home directly after the procedure.  You should plan to take it easy, moving slowly for the rest of the day.  You can resume normal activity the day after the procedure however you should NOT DRIVE or use heavy machinery for 24 hours (because of the sedation medicines used during the test).    SYMPTOMS TO REPORT IMMEDIATELY: A gastroenterologist can be reached at any hour.  During normal business hours, 8:30 AM to 5:00 PM Monday through Friday,  call [phone].  After hours and on weekends, please call the GI answering service at [phone] who will take a message and have the physician on call contact you.   Following lower endoscopy (colonoscopy or flexible sigmoidoscopy):  Excessive amounts of blood in the stool  Significant tenderness or worsening of abdominal pains  Swelling of the abdomen that is new, acute  Fever of 100F o  FOLLOW UP: If any biopsies were taken you will be contacted by phone or by letter within the next 1-3 weeks.  Call your gastroenterologist if you have not heard about the biopsies in 3 weeks.  Our staff will call the home number listed on your records the next business day following your procedure to check on you and address any questions or concerns that you may have at that time regarding the information given to you following your procedure. This is a courtesy call and so if there is no answer at the home number and we have not heard from you through the emergency physician on call, we will assume that you have returned to your regular daily activities without incident.  SIGNATURES/CONFIDENTIALITY: You and/or your care partner have signed paperwork which will be entered into your electronic medical record.  These signatures attest to the fact that that the information above on your After Visit Summary has been reviewed and is understood.  Full responsibility of the confidentiality of this discharge  information lies with you and/or your care-partner.    Polyp and high fiber diet information given.  Aciphex 20 mg by mouth daily.

## 2012-06-02 ENCOUNTER — Telehealth: Payer: Self-pay | Admitting: *Deleted

## 2012-06-02 NOTE — Telephone Encounter (Signed)
  Follow up Call-  Call back number 05/30/2012  Post procedure Call Back phone  # [phone]  Permission to leave phone message Yes     Answering machine that did not have patient's identifier information. Did not feel comfortable leaving message.

## 2012-06-05 ENCOUNTER — Encounter: Payer: Self-pay | Admitting: Internal Medicine

## 2012-06-10 ENCOUNTER — Ambulatory Visit: Payer: Self-pay | Admitting: Family Medicine

## 2012-06-11 ENCOUNTER — Telehealth: Payer: Self-pay | Admitting: Internal Medicine

## 2012-06-11 NOTE — Telephone Encounter (Signed)
Patient states she is still having bloating and right side pain/rib pain. She states Dr. Juanda Chance told her to schedule an OV if this continued.Scheduled patient on 06/24/12 at 2:00 PM. She is taking Aciphex daily. Any other suggestions?

## 2012-06-11 NOTE — Telephone Encounter (Signed)
Please start  levbid .375 mg, #40 1 po qam., 1 refill,and keep her appointment, last CT sca of the abd 2010 was normal

## 2012-06-11 NOTE — Telephone Encounter (Signed)
Left a message for patient to call me. 

## 2012-06-12 ENCOUNTER — Encounter: Payer: Self-pay | Admitting: Family Medicine

## 2012-06-12 ENCOUNTER — Encounter: Payer: Self-pay | Admitting: *Deleted

## 2012-06-12 DIAGNOSIS — Z9289 Personal history of other medical treatment: Secondary | ICD-10-CM | POA: Insufficient documentation

## 2012-06-12 MED ORDER — HYOSCYAMINE SULFATE ER 0.375 MG PO TB12: ORAL_TABLET | ORAL | Status: DC

## 2012-06-12 NOTE — Telephone Encounter (Signed)
Spoke with patient and gave her Dr. Brodie's recommendations. 

## 2012-06-16 ENCOUNTER — Encounter: Payer: Self-pay | Admitting: *Deleted

## 2012-06-18 ENCOUNTER — Other Ambulatory Visit: Payer: Self-pay | Admitting: Family Medicine

## 2012-06-18 NOTE — Telephone Encounter (Signed)
5mg  rx sent.  Thanks.

## 2012-06-18 NOTE — Telephone Encounter (Signed)
Received refill request electronically from pharmacy. Request does not match medication sheet. Spoke to patient and was advised that she is taking 1/2 daily. Patient stated that she can continue to cut them in half or can send script in for 5 mg. Please advise.

## 2012-06-24 ENCOUNTER — Ambulatory Visit (INDEPENDENT_AMBULATORY_CARE_PROVIDER_SITE_OTHER): Payer: Medicare Other | Admitting: Internal Medicine

## 2012-06-24 ENCOUNTER — Encounter: Payer: Self-pay | Admitting: Internal Medicine

## 2012-06-24 ENCOUNTER — Other Ambulatory Visit (INDEPENDENT_AMBULATORY_CARE_PROVIDER_SITE_OTHER): Payer: Medicare Other

## 2012-06-24 VITALS — BP 130/86 | HR 72 | Ht 66.0 in | Wt 208.1 lb

## 2012-06-24 DIAGNOSIS — R141 Gas pain: Secondary | ICD-10-CM

## 2012-06-24 DIAGNOSIS — R109 Unspecified abdominal pain: Secondary | ICD-10-CM

## 2012-06-24 DIAGNOSIS — R140 Abdominal distension (gaseous): Secondary | ICD-10-CM

## 2012-06-24 DIAGNOSIS — R142 Eructation: Secondary | ICD-10-CM

## 2012-06-24 DIAGNOSIS — R143 Flatulence: Secondary | ICD-10-CM

## 2012-06-24 LAB — COMPREHENSIVE METABOLIC PANEL
ALT: 22 U/L (ref 0–35)
AST: 20 U/L (ref 0–37)
Albumin: 4.5 g/dL (ref 3.5–5.2)
Alkaline Phosphatase: 102 U/L (ref 39–117)
BUN: 20 mg/dL (ref 6–23)
CO2: 27 mEq/L (ref 19–32)
Calcium: 9.4 mg/dL (ref 8.4–10.5)
Chloride: 105 mEq/L (ref 96–112)
Creatinine, Ser: 1.1 mg/dL (ref 0.4–1.2)
GFR: 54.05 mL/min — ABNORMAL LOW (ref >60.00)
Glucose, Bld: 115 mg/dL — ABNORMAL HIGH (ref 70–99)
Potassium: 4.7 mEq/L (ref 3.5–5.1)
Sodium: 142 mEq/L (ref 135–145)
Total Bilirubin: 0.6 mg/dL (ref 0.3–1.2)
Total Protein: 7.7 g/dL (ref 6.0–8.3)

## 2012-06-24 MED ORDER — DICYCLOMINE HCL 10 MG PO CAPS: 10.0000 mg | ORAL_CAPSULE | Freq: Two times a day (BID) | ORAL | Status: DC

## 2012-06-24 NOTE — Progress Notes (Signed)
[name] 25-Dec-1940 MRN [id number]   History of Present Illness:  This is a 71 year old white female with irritable bowel syndrome, bloating, pain in left and right upper quadrants as well as in the pelvis. She also has dyspepsia and an almost daily discomfort in her abdomen. She had a recent colonoscopy which showed 3 tubular adenomas. Her recall colonoscopy is due in 5 years. She had evaluation for abdominal pain in 2010 which showed a normal CT scan of the abdomen. An upper endoscopy showed gastritis and a 2 cm hiatal hernia. She often wakes up in the morning with nausea and dyspepsia. There is a history of breast cancer and a family history of colon cancer in her maternal grandmother.  Past Medical History  Diagnosis Date  . Fatty liver   . Hx of adenomatous colonic polyps   . Esophageal reflux   . Pure hypercholesterolemia   . Carcinoma in situ of breast 2001  . Labyrinthitis, unspecified   . Urinary tract infection, site not specified   . Unspecified essential hypertension   . Cataract 1988, 1990    [facility]  . Fibromyalgia   . Anxiety   . Arthritis    Past Surgical History  Procedure Date  . Foot surgery 1980    minor   . Breast biopsy 07/2000    positive right side, [facility]   . Breast lumpectomy 08/20/2000    right breast, malignant stage I, [facility]  . Cataract extraction 1988, 1990    reports that she has never smoked. She has never used smokeless tobacco. She reports that she does not drink alcohol or use illicit drugs. family history includes Alzheimer's disease in her father; Dementia in her father; Heart disease in her brothers; Pancreatitis in her brother; Parkinsonism in her mother; and Prostate cancer in her brothers.  There is no history of Colon cancer. Allergies  Allergen Reactions  . Lipitor (Atorvastatin)     intolerant  . Metoprolol Succinate     REACTION: Exhaustion, increased dizziness  . Nitrofurantoin     REACTION: Rash  . Sulfadiazine    REACTION: Rash        Review of Systems: Positive for bloating, abdominal distention. Denies rectal bleeding  The remainder of the 10 point ROS is negative except as outlined in H&P   Physical Exam: General appearance  Well developed, in no distress. Overweight Eyes- non icteric. HEENT nontraumatic, normocephalic. Mouth no lesions, tongue papillated, no cheilosis. Neck supple without adenopathy, thyroid not enlarged, no carotid bruits, no JVD. Lungs Clear to auscultation bilaterally. Cor normal S1, normal S2, regular rhythm, no murmur,  quiet precordium. Abdomen: Overweight soft abdomen with normal active bowel sounds. Mild tenderness, predominantly left lower quadrant and epigastrium, liver edge at costal margin. Rectal: Not repeated. Extremities no pedal edema. Skin no lesions. Neurological alert and oriented x 3. Psychological normal mood and affect.  Assessment and Plan:  Problem #1 Continued abdominal symptoms, most likely functional, but patient is worried about the possibility of ovarian cancer. She also wants to know if she needs a Pap smear. We are going to switch her omeprazole from morning to evening dose and start her on Bentyl 10 mg by mouth twice a day. We will proceed with a CT scan of the abdomen and pelvis with IV and oral contrast to rule out any structural abnormalities in the abdomen.or pelvis. She would like to clarify guidelines for  Pap smear exams in a postmenopausal  patient with a history of breast cancer.  Problem #2 Colon polyps. A recall colonoscopy will be due in 5 years.   06/24/2012 [name]

## 2012-06-24 NOTE — Patient Instructions (Addendum)
We have sent the following medications to your pharmacy for you to pick up at your convenience: Bentyl Your physician has requested that you go to the basement for the following lab work before leaving today: CMET  You have been scheduled for a CT scan of the abdomen and pelvis at [facility] ([address]---this is in the same building as [facility]).   You are scheduled on 06/25/12 at 2:00 pm. You should arrive 15 minutes prior to your appointment time for registration. Please follow the written instructions below on the day of your exam:  WARNING: IF YOU ARE ALLERGIC TO IODINE/X-RAY DYE, PLEASE NOTIFY RADIOLOGY IMMEDIATELY AT [phone]! YOU WILL BE GIVEN A 13 HOUR PREMEDICATION PREP.  1) Do not eat or drink anything after 10:00 am (4 hours prior to your test) 2) You have been given 2 bottles of oral contrast to drink. The solution may taste better if refrigerated, but do NOT add ice or any other liquid to this solution. Shake well before drinking.    Drink 1 bottle of contrast @ 12:00 pm (2 hours prior to your exam)  Drink 1 bottle of contrast @ 1:00 pm (1 hour prior to your exam)  You may take any medications as prescribed with a small amount of water except for the following: Metformin, Glucophage, Glucovance, Avandamet, Riomet, Fortamet, Actoplus Met, Janumet, Glumetza or Metaglip. The above medications must be held the day of the exam AND 48 hours after the exam.  The purpose of you drinking the oral contrast is to aid in the visualization of your intestinal tract. The contrast solution may cause some diarrhea. Before your exam is started, you will be given a small amount of fluid to drink. Depending on your individual set of symptoms, you may also receive an intravenous injection of x-ray contrast/dye. Plan on being at [facility] for 30 minutes or long, depending on the type of exam you are having performed.  If you have any questions regarding your exam  or if you need to reschedule, you may call the CT department at [phone] between the hours of 8:00 am and 5:00 pm, Monday-Friday.  ________________________________________________________________________  CC: Dr [name] Givens

## 2012-06-25 ENCOUNTER — Other Ambulatory Visit: Payer: Medicare Other

## 2012-07-04 ENCOUNTER — Inpatient Hospital Stay: Admission: RE | Admit: 2012-07-04 | Payer: Medicare Other | Source: Ambulatory Visit

## 2012-07-07 ENCOUNTER — Ambulatory Visit (INDEPENDENT_AMBULATORY_CARE_PROVIDER_SITE_OTHER)
Admission: RE | Admit: 2012-07-07 | Discharge: 2012-07-07 | Disposition: A | Discharge status: Home / Self Care | Payer: Medicare Other | Source: Ambulatory Visit | Attending: Internal Medicine | Admitting: Internal Medicine

## 2012-07-07 DIAGNOSIS — R143 Flatulence: Secondary | ICD-10-CM

## 2012-07-07 DIAGNOSIS — R109 Unspecified abdominal pain: Secondary | ICD-10-CM

## 2012-07-07 DIAGNOSIS — R141 Gas pain: Secondary | ICD-10-CM

## 2012-07-07 MED ORDER — IOHEXOL 300 MG/ML  SOLN
100.0000 mL | Freq: Once | INTRAMUSCULAR | Status: AC | PRN
  Administered 2012-07-07: 100 mL via INTRAVENOUS

## 2012-07-08 ENCOUNTER — Telehealth: Payer: Self-pay | Admitting: Internal Medicine

## 2012-07-08 NOTE — Telephone Encounter (Signed)
Left a message for patient to call me. 

## 2012-07-11 NOTE — Telephone Encounter (Signed)
Spoke with patient and she has already gotten CT results

## 2012-07-11 NOTE — Telephone Encounter (Signed)
Left a message for patient to call me. 

## 2012-08-01 ENCOUNTER — Ambulatory Visit (INDEPENDENT_AMBULATORY_CARE_PROVIDER_SITE_OTHER): Payer: Medicare Other | Admitting: Family Medicine

## 2012-08-01 ENCOUNTER — Encounter: Payer: Self-pay | Admitting: Family Medicine

## 2012-08-01 VITALS — BP 128/80 | HR 76 | Temp 97.8°F | Wt 210.5 lb

## 2012-08-01 DIAGNOSIS — T7840XA Allergy, unspecified, initial encounter: Secondary | ICD-10-CM

## 2012-08-01 DIAGNOSIS — Z91030 Bee allergy status: Secondary | ICD-10-CM

## 2012-08-01 MED ORDER — TRIAMCINOLONE ACETONIDE 0.1 % EX CREA: TOPICAL_CREAM | Freq: Two times a day (BID) | CUTANEOUS | Status: DC

## 2012-08-01 MED ORDER — EPINEPHRINE 0.3 MG/0.3ML IJ DEVI: 0.3000 mg | Freq: Once | INTRAMUSCULAR | Status: DC

## 2012-08-01 NOTE — Progress Notes (Signed)
Stung by yellow jacket day before yesterday.  Stung on L forearm and on her backside.  L forearm is very itchy and red in meantime.  No FCNAVD.  No dysphagia.  She was mildly SOB yesterday, but is better today.  Taking benadryl.  No lip or tongue changes.   Meds, vitals, and allergies reviewed.   ROS: See HPI.  Otherwise, noncontributory.  nad ncat OP wnl, no lip edema No stridor rrr ctab R buttock with indurated area at bite site but no fluctuance, chaperoned exam L forearm with indurated area at bite site but no fluctuance

## 2012-08-01 NOTE — Patient Instructions (Addendum)
Use the cream twice a day as needed.  Take benadryl 25mg  twice a day.  If you have another sting and get short of breath (or have lip/tongue swelling), then use the pen and dial 911.

## 2012-08-03 DIAGNOSIS — Z91030 Bee allergy status: Secondary | ICD-10-CM | POA: Insufficient documentation

## 2012-08-03 HISTORY — DX: Bee allergy status: Z91.030

## 2012-08-03 NOTE — Assessment & Plan Note (Signed)
Presumed.  Use oral benadryl, topical steroids with steroid caution and she'll fill epipen for use if SOB with another sting.  She agrees.  Routine instructions given for epipen use, esp to ER if used.  She agrees.

## 2012-10-02 ENCOUNTER — Telehealth: Payer: Self-pay | Admitting: Family Medicine

## 2012-10-02 DIAGNOSIS — M1990 Unspecified osteoarthritis, unspecified site: Secondary | ICD-10-CM

## 2012-10-02 NOTE — Telephone Encounter (Signed)
[name] ask if she could have a referal to see Dr. Lavenia Atlas for her back and feet pain. [name] stated that she is having a lot of trouble with them hurting a lot.

## 2012-10-02 NOTE — Telephone Encounter (Signed)
Referral is in.

## 2012-12-04 ENCOUNTER — Encounter: Payer: Self-pay | Admitting: Family Medicine

## 2012-12-04 DIAGNOSIS — M1990 Unspecified osteoarthritis, unspecified site: Secondary | ICD-10-CM | POA: Insufficient documentation

## 2012-12-08 ENCOUNTER — Encounter: Payer: Self-pay | Admitting: Family Medicine

## 2012-12-08 ENCOUNTER — Ambulatory Visit (INDEPENDENT_AMBULATORY_CARE_PROVIDER_SITE_OTHER): Payer: Medicare Other | Admitting: Family Medicine

## 2012-12-08 VITALS — BP 152/82 | HR 77 | Temp 97.6°F | Wt 210.0 lb

## 2012-12-08 DIAGNOSIS — IMO0001 Reserved for inherently not codable concepts without codable children: Secondary | ICD-10-CM

## 2012-12-08 DIAGNOSIS — R319 Hematuria, unspecified: Secondary | ICD-10-CM

## 2012-12-08 DIAGNOSIS — N390 Urinary tract infection, site not specified: Secondary | ICD-10-CM

## 2012-12-08 LAB — POCT URINALYSIS DIPSTICK
Bilirubin, UA: NEGATIVE
Glucose, UA: NEGATIVE
Ketones, UA: NEGATIVE
Leukocytes, UA: NEGATIVE
Nitrite, UA: NEGATIVE
Spec Grav, UA: 1.02
Urobilinogen, UA: NEGATIVE
pH, UA: 6

## 2012-12-08 MED ORDER — CIPROFLOXACIN HCL 250 MG PO TABS: 250.0000 mg | ORAL_TABLET | Freq: Two times a day (BID) | ORAL | Status: DC

## 2012-12-08 NOTE — Progress Notes (Signed)
Dysuria: yes, burning and urgency duration of symptoms: 1 week abdominal pain: no  fevers:no back pain:no vomiting:no She took some leftover keflex yesterday.   Able to tolerate cipro.   Meds, vitals, and allergies reviewed.   ROS: See HPI.  Otherwise negative.    GEN: nad, alert and oriented NECK: supple CV: rrr.  PULM: ctab, no inc wob ABD: soft, +bs, suprapubic area not tender EXT: no edema SKIN: no acute rash BACK: no CVA pain

## 2012-12-08 NOTE — Patient Instructions (Signed)
Drink plenty of water and start the antibiotics today.  We'll contact you with your lab report.  Take care.   

## 2012-12-08 NOTE — Assessment & Plan Note (Signed)
Presumed, start cipro, check ucx.  Inc fluids.  F/u prn.  Nontoxic.

## 2012-12-08 NOTE — Assessment & Plan Note (Signed)
She has had some ongoing aches in the arms and legs (not on statin) and we talked about this.  She'll f/u if it continues after the presumed UTI is resolved.  It predate the UTI.

## 2012-12-09 LAB — URINE CULTURE
Colony Count: NO GROWTH
Organism ID, Bacteria: NO GROWTH

## 2012-12-23 ENCOUNTER — Other Ambulatory Visit: Payer: Self-pay | Admitting: *Deleted

## 2012-12-23 MED ORDER — OMEPRAZOLE 40 MG PO CPDR: 40.0000 mg | DELAYED_RELEASE_CAPSULE | Freq: Every day | ORAL | Status: DC

## 2012-12-31 ENCOUNTER — Other Ambulatory Visit (INDEPENDENT_AMBULATORY_CARE_PROVIDER_SITE_OTHER): Payer: Medicare Other

## 2012-12-31 ENCOUNTER — Ambulatory Visit (INDEPENDENT_AMBULATORY_CARE_PROVIDER_SITE_OTHER): Payer: Medicare Other | Admitting: Internal Medicine

## 2012-12-31 ENCOUNTER — Encounter: Payer: Self-pay | Admitting: Internal Medicine

## 2012-12-31 VITALS — BP 110/82 | HR 64 | Ht 66.0 in | Wt 213.6 lb

## 2012-12-31 DIAGNOSIS — K219 Gastro-esophageal reflux disease without esophagitis: Secondary | ICD-10-CM

## 2012-12-31 DIAGNOSIS — R110 Nausea: Secondary | ICD-10-CM

## 2012-12-31 DIAGNOSIS — R109 Unspecified abdominal pain: Secondary | ICD-10-CM

## 2012-12-31 LAB — CBC WITH DIFFERENTIAL/PLATELET
Basophils Absolute: 0.1 10*3/uL (ref 0.0–0.1)
Basophils Relative: 0.9 % (ref 0.0–3.0)
Eosinophils Absolute: 0.2 10*3/uL (ref 0.0–0.7)
Eosinophils Relative: 3.2 % (ref 0.0–5.0)
HCT: 40.4 % (ref 36.0–46.0)
Hemoglobin: 13.7 g/dL (ref 12.0–15.0)
Lymphocytes Relative: 22.9 % (ref 12.0–46.0)
Lymphs Abs: 1.7 10*3/uL (ref 0.7–4.0)
MCHC: 34 g/dL (ref 30.0–36.0)
MCV: 87 fl (ref 78.0–100.0)
Monocytes Absolute: 0.6 10*3/uL (ref 0.1–1.0)
Monocytes Relative: 8.3 % (ref 3.0–12.0)
Neutro Abs: 4.9 10*3/uL (ref 1.4–7.7)
Neutrophils Relative %: 64.7 % (ref 43.0–77.0)
Platelets: 240 10*3/uL (ref 150.0–400.0)
RBC: 4.64 Mil/uL (ref 3.87–5.11)
RDW: 12.7 % (ref 11.5–14.6)
WBC: 7.5 10*3/uL (ref 4.5–10.5)

## 2012-12-31 LAB — URINALYSIS, ROUTINE W REFLEX MICROSCOPIC
Bilirubin Urine: NEGATIVE
Hgb urine dipstick: NEGATIVE
Ketones, ur: NEGATIVE
Leukocytes, UA: NEGATIVE
Nitrite: NEGATIVE
Specific Gravity, Urine: 1.01 (ref 1.000–1.030)
Total Protein, Urine: NEGATIVE
Urine Glucose: NEGATIVE
Urobilinogen, UA: 0.2 (ref 0.0–1.0)
pH: 5.5 (ref 5.0–8.0)

## 2012-12-31 LAB — TSH: TSH: 4.38 u[IU]/mL (ref 0.35–5.50)

## 2012-12-31 MED ORDER — LORAZEPAM 1 MG PO TABS: ORAL_TABLET | ORAL | Status: DC

## 2012-12-31 MED ORDER — CIPROFLOXACIN HCL 250 MG PO TABS: 250.0000 mg | ORAL_TABLET | Freq: Two times a day (BID) | ORAL | Status: DC

## 2012-12-31 NOTE — Patient Instructions (Addendum)
We have sent the following medications to your pharmacy for you to pick up at your convenience: Cipro 250 mg twice daily x 10 days Ativan  Your physician has requested that you go to the basement for the following lab work before leaving today: CBC, TSH, Urinalysis  Some of the urologists you can choose from include Dr Patsi Sears, Dr Retta Diones, Dr Isabel Caprice. Their phone number is [phone].  CC: Dr [name] Givens

## 2012-12-31 NOTE — Progress Notes (Signed)
[name] Aug 30, 1941 MRN [id number]   History of Present Illness:  This is a 72 year old white female with irritable bowel syndrome, gastroesophageal reflux and a small hiatal hernia. She was nauseated and had epigastric discomfort when she made her appointment but she is well today. She has been treated for a urinary tract infection with Cipro and all her symptoms improved after finishing the course. She now has recurrence of urinary symptoms and urinary stress incontinence. Her reflux has been controlled with Prilosec 20 mg daily. A CT scan of the abdomen in June 2010 was normal. Her upper endoscopy at that time showed a small 2-3 cm hiatal hernia. Her upper abdominal ultrasound showed fatty liver. She is up-to-date on her colonoscopy which was last done in July 2013 and showed 3 adenomatous polyps. Currently, she has no specific GI issues. Another complaint has been dizziness when she stands up. She has been on lisinopril. Her blood pressure today is 110/82.   Past Medical History  Diagnosis Date  . Fatty liver   . Hx of adenomatous colonic polyps   . Esophageal reflux   . Pure hypercholesterolemia   . Carcinoma in situ of breast 2001  . Labyrinthitis, unspecified   . Urinary tract infection, site not specified   . Unspecified essential hypertension   . Cataract 1988, 1990    [facility]  . Fibromyalgia   . Anxiety   . Arthritis    Past Surgical History  Procedure Date  . Foot surgery 1980    minor   . Breast biopsy 07/2000    positive right side, [facility]   . Breast lumpectomy 08/20/2000    right breast, malignant stage I, [facility]  . Cataract extraction 1988, 1990    reports that she has never smoked. She has never used smokeless tobacco. She reports that she does not drink alcohol or use illicit drugs. family history includes Alzheimer's disease in her father; Dementia in her father; Heart disease in her brothers; Pancreatitis in her brother; Parkinsonism in her mother; and  Prostate cancer in her brothers.  There is no history of Colon cancer. Allergies  Allergen Reactions  . Lipitor (Atorvastatin)     intolerant  . Metoprolol Succinate     REACTION: Exhaustion, increased dizziness  . Nitrofurantoin     REACTION: Rash  . Sulfadiazine     REACTION: Rash  . Yellow Jacket Venom (Bee Venom)     Short of breath        Review of Systems: Currently denies nausea. Denies abdominal pain or change in bowel habits positive for urinary incontinence  The remainder of the 10 point ROS is negative except as outlined in H&P   Physical Exam: General appearance  Well developed, in no distress. Overweight Eyes- non icteric. HEENT nontraumatic, normocephalic. Mouth no lesions, tongue papillated, no cheilosis. Neck supple without adenopathy, thyroid not enlarged, no carotid bruits, no JVD. Lungs Clear to auscultation bilaterally. Cor normal S1, normal S2, regular rhythm, no murmur,  quiet precordium. Abdomen: Tender in suprapubic area. Normal active bowel sounds. Liver edge at costal margin. Rectal: Not done. Extremities no pedal edema. Skin no lesions. Neurological alert and oriented x 3. Psychological normal mood and affect.  Assessment and Plan:  Problem #1 GI symptoms of dyspepsia and reflux have resolved on Prilosec and after treatment of a urinary tract infection. No further workup is necessary. I offered a gastric emptying scan or repeat upper endoscopy. She may have to have this done if her symptoms recur.  Problem #2 Urinary tract infection treated by Dr. Para March. We will repeat a urinalysis today because she is still symptomatic. We will fill Cipro 250 by mouth twice a day for 10 days.  Problem #3 Dizziness. This may be orthostatic. She has normal blood pressure on lisinopril. Blood pressure today is 110/82. I asked her to check her blood pressures daily and record and discuss with Dr. Para March     12/31/2012 [name]

## 2013-03-23 ENCOUNTER — Telehealth: Payer: Self-pay | Admitting: Internal Medicine

## 2013-03-23 DIAGNOSIS — R1012 Left upper quadrant pain: Secondary | ICD-10-CM

## 2013-03-23 MED ORDER — OMEPRAZOLE 40 MG PO CPDR: DELAYED_RELEASE_CAPSULE | ORAL | Status: DC

## 2013-03-23 NOTE — Telephone Encounter (Signed)
Scheduled for GES at [facility] on 04/08/13 at 9:00 AM. NPO after midnight and hold GI medications after midnight. Rx sent to pharmacy.

## 2013-03-23 NOTE — Telephone Encounter (Signed)
Please increase Protonix to 40 mg po bid and schedule GES. Thanx

## 2013-03-23 NOTE — Telephone Encounter (Signed)
Spoke with patient and she is calling to report same symptoms from 12/31/12 OV are occurring and getting worse. C/O pain in left rib cage area, bloating and nausea. Symptoms are varying daily and occur off and on. Sometimes she wakes up with pain and nausea. On 12/31/12 OV note, it is mentions possible GES or upper endo if symptoms reoccur. Please, advise.

## 2013-04-08 ENCOUNTER — Encounter (HOSPITAL_COMMUNITY): Payer: Medicare Other

## 2013-05-01 ENCOUNTER — Encounter (HOSPITAL_COMMUNITY): Payer: Medicare Other

## 2013-05-15 ENCOUNTER — Other Ambulatory Visit: Payer: Self-pay | Admitting: Family Medicine

## 2013-05-15 DIAGNOSIS — I10 Essential (primary) hypertension: Secondary | ICD-10-CM

## 2013-05-19 ENCOUNTER — Encounter (HOSPITAL_COMMUNITY): Payer: Medicare Other

## 2013-05-27 ENCOUNTER — Other Ambulatory Visit: Payer: Medicare Other

## 2013-06-04 ENCOUNTER — Encounter: Payer: Medicare Other | Admitting: Family Medicine

## 2013-06-05 ENCOUNTER — Encounter: Payer: Medicare Other | Admitting: Family Medicine

## 2013-06-16 ENCOUNTER — Other Ambulatory Visit: Payer: Self-pay | Admitting: Internal Medicine

## 2013-06-16 DIAGNOSIS — R1012 Left upper quadrant pain: Secondary | ICD-10-CM

## 2013-06-17 ENCOUNTER — Encounter (HOSPITAL_COMMUNITY): Payer: Medicare Other

## 2013-07-09 ENCOUNTER — Encounter (HOSPITAL_COMMUNITY): Admission: RE | Admit: 2013-07-09 | Payer: Medicare Other | Source: Ambulatory Visit

## 2013-07-17 ENCOUNTER — Encounter (HOSPITAL_COMMUNITY): Payer: Medicare Other

## 2013-07-17 ENCOUNTER — Other Ambulatory Visit: Payer: Medicare Other

## 2013-07-24 ENCOUNTER — Encounter: Payer: Medicare Other | Admitting: Family Medicine

## 2013-08-21 ENCOUNTER — Ambulatory Visit: Payer: Medicare Other | Admitting: Internal Medicine

## 2013-08-27 ENCOUNTER — Ambulatory Visit (INDEPENDENT_AMBULATORY_CARE_PROVIDER_SITE_OTHER): Payer: Medicare Other

## 2013-08-27 DIAGNOSIS — Z23 Encounter for immunization: Secondary | ICD-10-CM

## 2013-09-10 ENCOUNTER — Other Ambulatory Visit (INDEPENDENT_AMBULATORY_CARE_PROVIDER_SITE_OTHER): Payer: Medicare Other

## 2013-09-10 DIAGNOSIS — I10 Essential (primary) hypertension: Secondary | ICD-10-CM

## 2013-09-10 LAB — COMPREHENSIVE METABOLIC PANEL
ALT: 17 U/L (ref 0–35)
AST: 18 U/L (ref 0–37)
Albumin: 4 g/dL (ref 3.5–5.2)
Alkaline Phosphatase: 84 U/L (ref 39–117)
BUN: 19 mg/dL (ref 6–23)
CO2: 27 mEq/L (ref 19–32)
Calcium: 9.2 mg/dL (ref 8.4–10.5)
Chloride: 106 mEq/L (ref 96–112)
Creatinine, Ser: 0.8 mg/dL (ref 0.4–1.2)
GFR: 70.45 mL/min (ref >60.00)
Glucose, Bld: 112 mg/dL — ABNORMAL HIGH (ref 70–99)
Potassium: 4.3 mEq/L (ref 3.5–5.1)
Sodium: 142 mEq/L (ref 135–145)
Total Bilirubin: 0.9 mg/dL (ref 0.3–1.2)
Total Protein: 7 g/dL (ref 6.0–8.3)

## 2013-09-10 LAB — LIPID PANEL
Cholesterol: 192 mg/dL (ref 0–200)
HDL: 49 mg/dL (ref >39.00)
LDL Cholesterol: 123 mg/dL — ABNORMAL HIGH (ref 0–99)
Total CHOL/HDL Ratio: 4
Triglycerides: 102 mg/dL (ref 0.0–149.0)
VLDL: 20.4 mg/dL (ref 0.0–40.0)

## 2013-09-17 ENCOUNTER — Ambulatory Visit (INDEPENDENT_AMBULATORY_CARE_PROVIDER_SITE_OTHER): Payer: Medicare Other | Admitting: Family Medicine

## 2013-09-17 ENCOUNTER — Encounter: Payer: Self-pay | Admitting: Family Medicine

## 2013-09-17 VITALS — BP 152/90 | HR 64 | Temp 97.7°F | Ht 66.0 in | Wt 206.8 lb

## 2013-09-17 DIAGNOSIS — R739 Hyperglycemia, unspecified: Secondary | ICD-10-CM | POA: Insufficient documentation

## 2013-09-17 DIAGNOSIS — K219 Gastro-esophageal reflux disease without esophagitis: Secondary | ICD-10-CM

## 2013-09-17 DIAGNOSIS — R7309 Other abnormal glucose: Secondary | ICD-10-CM

## 2013-09-17 DIAGNOSIS — H8309 Labyrinthitis, unspecified ear: Secondary | ICD-10-CM

## 2013-09-17 DIAGNOSIS — M1990 Unspecified osteoarthritis, unspecified site: Secondary | ICD-10-CM

## 2013-09-17 DIAGNOSIS — I10 Essential (primary) hypertension: Secondary | ICD-10-CM

## 2013-09-17 DIAGNOSIS — Z0000 Encounter for general adult medical examination without abnormal findings: Secondary | ICD-10-CM

## 2013-09-17 NOTE — Assessment & Plan Note (Signed)
See scanned forms.  Routine anticipatory guidance given to patient.  See health maintenance. Flu 2014 Shingles 2010 PNA 2013 Tetanus 2012 Colonoscopy 2013 Breast cancer screening up to date. Done at [facility] Cognitive function addressed- see scanned forms- and if abnormal then additional documentation follows.  Advance directive discussed. "I would want my husband and daughter to make decisions" if needed. Advised patient to at minimum discuss with family about her wishes.

## 2013-09-17 NOTE — Assessment & Plan Note (Signed)
Rare BZD use, continue as is.   

## 2013-09-17 NOTE — Patient Instructions (Signed)
Stay off the lisinopril for now but if your BP stays high (>140/>90) then start it back.  Take care.  Work on cutting out sugar.  Glad to see you.

## 2013-09-17 NOTE — Assessment & Plan Note (Signed)
She'll check BP at home, restart ACE if BP elevated.  D/w pt about weight loss and sugar.

## 2013-09-17 NOTE — Assessment & Plan Note (Signed)
No change in tx now.

## 2013-09-17 NOTE — Progress Notes (Signed)
I have personally reviewed the Medicare Annual Wellness questionnaire and have noted 1. The patient's medical and social history 2. Their use of alcohol, tobacco or illicit drugs 3. Their current medications and supplements 4. The patient's functional ability including ADL's, fall risks, home safety risks and hearing or visual             impairment. 5. Diet and physical activities 6. Evidence for depression or mood disorders  The patients weight, height, BMI have been recorded in the chart and visual acuity is per eye clinic.  I have made referrals, counseling and provided education to the patient based review of the above and I have provided the pt with a written personalized care plan for preventive services.  See scanned forms.  Routine anticipatory guidance given to patient.  See health maintenance. Flu 2014 Shingles 2010 PNA 2013 Tetanus 2012 Colonoscopy 2013 Breast cancer screening up to date.  Cognitive function addressed- see scanned forms- and if abnormal then additional documentation follows.  Advance directive discussed. "I would want my husband and daughter to make decisions" if needed. Advised patient to at minimum discuss with family about her wishes.   She continues to have OA pain her feet and "I'm putting up with it."   Rare BZD use for vertigo.    Hyperglycemia.  Encouraged to cut carbs.  Discussed labs.    Hypertension:    No meds currently.  Chest pain with exertion:no Edema:rare BLE, mild Short of breath:no Average home BPs: encouraged her to check at home.  See instructions.    PMH and SH reviewed  Meds, vitals, and allergies reviewed.   ROS: See HPI.  Otherwise negative.    GEN: nad, alert and oriented HEENT: mucous membranes moist NECK: supple w/o LA CV: rrr. PULM: ctab, no inc wob ABD: soft, +bs EXT: no edema SKIN: no acute rash

## 2013-09-17 NOTE — Assessment & Plan Note (Signed)
Improved per patient 

## 2013-09-17 NOTE — Assessment & Plan Note (Signed)
Discussed, encouraged weight loss.

## 2014-02-11 ENCOUNTER — Encounter: Payer: Self-pay | Admitting: Family Medicine

## 2014-02-11 ENCOUNTER — Ambulatory Visit (INDEPENDENT_AMBULATORY_CARE_PROVIDER_SITE_OTHER): Payer: Commercial Managed Care - HMO | Admitting: Family Medicine

## 2014-02-11 VITALS — BP 148/84 | HR 76 | Temp 97.7°F | Wt 210.5 lb

## 2014-02-11 DIAGNOSIS — M7630 Iliotibial band syndrome, unspecified leg: Secondary | ICD-10-CM | POA: Insufficient documentation

## 2014-02-11 DIAGNOSIS — N390 Urinary tract infection, site not specified: Secondary | ICD-10-CM

## 2014-02-11 DIAGNOSIS — M629 Disorder of muscle, unspecified: Secondary | ICD-10-CM

## 2014-02-11 DIAGNOSIS — R300 Dysuria: Secondary | ICD-10-CM

## 2014-02-11 LAB — POCT URINALYSIS DIPSTICK
Bilirubin, UA: NEGATIVE
Blood, UA: NEGATIVE
Glucose, UA: NEGATIVE
Ketones, UA: NEGATIVE
Leukocytes, UA: NEGATIVE
Nitrite, UA: NEGATIVE
Protein, UA: NEGATIVE
Spec Grav, UA: >=1.03
Urobilinogen, UA: NEGATIVE
pH, UA: 6

## 2014-02-11 MED ORDER — CIPROFLOXACIN HCL 250 MG PO TABS: 250.0000 mg | ORAL_TABLET | Freq: Two times a day (BID) | ORAL | Status: DC

## 2014-02-11 MED ORDER — LISINOPRIL 5 MG PO TABS: 5.0000 mg | ORAL_TABLET | Freq: Every day | ORAL | Status: DC

## 2014-02-11 NOTE — Assessment & Plan Note (Signed)
Suggestive sx and exam.  D/w pt about leg raises, ext rotated leg raises and side leg lifts.  F/u prn.

## 2014-02-11 NOTE — Patient Instructions (Addendum)
If your pressure stays up, then restart the lisinopril.   Start the cipro.   Start with leg raised, rotated leg raises and side leg lifts.  Start with 5 of each and gradually increase up to 20 at a time. That should help.  Take care.

## 2014-02-11 NOTE — Assessment & Plan Note (Signed)
Possible. She already started cipro.  No use in ucx at this point.  Finish cipro and f/u prn.

## 2014-02-11 NOTE — Progress Notes (Signed)
Pre visit review using our clinic review tool, if applicable. No additional management support is needed unless otherwise documented below in the visit note. Patient states she took a Cipro last night.  Mike Craze, CMA  Dysuria:yes, burning with urination duration of symptoms: about 1 week abdominal pain:no fevers:no back pain:no Vomiting:no but some nausea See above re: cipro.   She continues to have pain near the R knee, inferior to knee, lateral to the R knee.  Pain is worse if standing after prolonged sitting.  No trauma.  She's compensating by putting more weight on the L leg.    Meds, vitals, and allergies reviewed.   ROS: See HPI.  Otherwise negative.    GEN: nad, alert and oriented HEENT: mucous membranes moist NECK: supple CV: rrr.  PULM: ctab, no inc wob ABD: soft, +bs, suprapubic area not tender EXT: no edema SKIN: no acute rash BACK: no CVA pain R knee with normal ROM, no crepitus on exam. distal ITB ttp. Normal quad strength but weak hip flexors noted

## 2014-09-02 ENCOUNTER — Other Ambulatory Visit: Payer: Self-pay | Admitting: Obstetrics

## 2014-09-02 DIAGNOSIS — R928 Other abnormal and inconclusive findings on diagnostic imaging of breast: Secondary | ICD-10-CM

## 2014-09-15 ENCOUNTER — Other Ambulatory Visit: Payer: Self-pay | Admitting: Family Medicine

## 2014-09-15 DIAGNOSIS — I10 Essential (primary) hypertension: Secondary | ICD-10-CM

## 2014-09-16 ENCOUNTER — Other Ambulatory Visit: Payer: Medicare Other

## 2014-09-16 ENCOUNTER — Other Ambulatory Visit: Payer: Medicare Other | Admitting: Family Medicine

## 2014-09-23 ENCOUNTER — Encounter: Payer: Medicare Other | Admitting: Family Medicine

## 2014-09-27 ENCOUNTER — Encounter: Payer: Self-pay | Admitting: Family Medicine

## 2014-09-27 ENCOUNTER — Ambulatory Visit (INDEPENDENT_AMBULATORY_CARE_PROVIDER_SITE_OTHER): Payer: Commercial Managed Care - HMO | Admitting: Family Medicine

## 2014-09-27 VITALS — BP 130/88 | HR 68 | Temp 98.0°F | Wt 207.5 lb

## 2014-09-27 DIAGNOSIS — N390 Urinary tract infection, site not specified: Secondary | ICD-10-CM | POA: Insufficient documentation

## 2014-09-27 DIAGNOSIS — J069 Acute upper respiratory infection, unspecified: Secondary | ICD-10-CM | POA: Insufficient documentation

## 2014-09-27 DIAGNOSIS — N3001 Acute cystitis with hematuria: Secondary | ICD-10-CM

## 2014-09-27 DIAGNOSIS — R300 Dysuria: Secondary | ICD-10-CM

## 2014-09-27 DIAGNOSIS — N3000 Acute cystitis without hematuria: Secondary | ICD-10-CM

## 2014-09-27 LAB — POCT URINALYSIS DIPSTICK
Bilirubin, UA: NEGATIVE
Glucose, UA: NEGATIVE
Ketones, UA: NEGATIVE
Leukocytes, UA: NEGATIVE
Nitrite, UA: NEGATIVE
Protein, UA: NEGATIVE
Spec Grav, UA: 1.02
Urobilinogen, UA: 0.2
pH, UA: 6

## 2014-09-27 MED ORDER — CIPROFLOXACIN HCL 250 MG PO TABS: 250.0000 mg | ORAL_TABLET | Freq: Two times a day (BID) | ORAL | Status: DC

## 2014-09-27 NOTE — Assessment & Plan Note (Signed)
Possible, unclear with the prev cipro use.  Some better apparently now. No use in ucx.  D/w pt.  Finish cipro rx and fu prn.  Nontoxic.

## 2014-09-27 NOTE — Patient Instructions (Signed)
Drink plenty of water and start the antibiotics today.  We'll contact you with your lab report.  Take care.   Use nasal saline in the meantime and that should help.

## 2014-09-27 NOTE — Assessment & Plan Note (Signed)
Samuel [place] possible, vs allergies.  Nontoxic. If uri, then mild. Use nasal saline and f/u prn.

## 2014-09-27 NOTE — Progress Notes (Signed)
Pre visit review using our clinic review tool, if applicable. No additional management support is needed unless otherwise documented below in the visit note.  Dysuria: yes, a few days ago abdominal pain: yes, some lower abd pain the last few days, some better now fevers:no back pain: no vomiting:no other concerns: correction to chart- ucx not done since she already took some cipro recently.  I've asked her not to hoard abx and then take them intermittently.   Sinus pressure.  No fevers.  Sore across the forehead B.  She feels fatigued somewhat.  She has a lot on her with her 4 older brothers.  Going on for about 2-3 weeks.   Meds, vitals, and allergies reviewed.   ROS: See HPI.  Otherwise negative.    GEN: nad, alert and oriented HEENT: mucous membranes moist, TM wnl, nasal exam slightly stuffier on the R side, R frontal sinus minimally ttp  NECK: supple CV: rrr.  PULM: ctab, no inc wob ABD: soft, +bs, suprapubic area not tender EXT: no edema SKIN: no acute rash BACK: no CVA pain

## 2014-10-05 ENCOUNTER — Telehealth: Payer: Self-pay

## 2014-10-05 NOTE — Telephone Encounter (Signed)
Thanks

## 2014-10-05 NOTE — Telephone Encounter (Signed)
Cindy with [facility] said pt was seen 09/27/14 and finished cipro 10/04/14;pt's UTI symptoms improved slightly but never completely went away. Today burning upon urination with frequency; pt "feels bad". No fever and no confusion.  Dr Damita Dunnings said pt can take AZO today and schedule appt with Dr Damita Dunnings 10/06/14 at 2 pm. Jenny Reichmann verified pt can take AZO and pt will keep appt 10/06/14 with Dr Damita Dunnings; if condition changes or worsens pt will cb or go to UC.

## 2014-10-05 NOTE — Telephone Encounter (Signed)
Patient Information:  Caller Name: Claribel  Phone: [phone]  Patient: [name], [name]  Gender: Female  DOB: 07/25/1941  Age: 73 Years  PCP: [name] Brigitte Pulse) [facility])  Office Follow Up:  Does the office need to follow up with this patient?: No  Instructions For The Office: N/A  RN Note:  Called office and spoke to nurse Rena who consulted with Dr.Duncan. He advised that pt take AZO today and an appt will be scheduled for tomorrow(10/06/14) @ 1400 with him. Advised to call back if any fever,confusion,back or side pain or any discoloration of urine. Home care advice given. Pt verbalized understanding.  Symptoms  Reason For Call & Symptoms: Calling about urinary symptoms--burning on urination, feels like she has to go all the time and feels bad. Was seen at the office--dx with UTI, started on Cipro and finished it yesterday.  Reviewed Health History In EMR: No  Reviewed Medications In EMR: No  Reviewed Allergies In EMR: No  Reviewed Surgeries / Procedures: No  Date of Onset of Symptoms: 10/04/2014  Guideline(s) Used:  Urination Pain - Female  Disposition Per Guideline:   See Today in Office  Reason For Disposition Reached:   Taking antibiotic > 3 days for UTI and painful urination not improved  Advice Given:  Fluids:   Drink extra fluids. Drink 8-10 glasses of liquids a day (Reason: to produce a dilute, non-irritating urine).  Call Back If:  You become worse.  Patient Will Follow Care Advice:  YES  Appointment Scheduled:  10/06/2014 14:00:00 Appointment Scheduled Provider:  [name] Brigitte Pulse) [facility])

## 2014-10-06 ENCOUNTER — Encounter: Payer: Self-pay | Admitting: Family Medicine

## 2014-10-06 ENCOUNTER — Ambulatory Visit (INDEPENDENT_AMBULATORY_CARE_PROVIDER_SITE_OTHER): Payer: Commercial Managed Care - HMO | Admitting: Family Medicine

## 2014-10-06 VITALS — BP 140/82 | HR 65 | Temp 97.7°F | Wt 206.5 lb

## 2014-10-06 DIAGNOSIS — N3001 Acute cystitis with hematuria: Secondary | ICD-10-CM

## 2014-10-06 DIAGNOSIS — R300 Dysuria: Secondary | ICD-10-CM

## 2014-10-06 LAB — POCT URINALYSIS DIPSTICK
Bilirubin, UA: NEGATIVE
Blood, UA: NEGATIVE
Glucose, UA: NEGATIVE
Ketones, UA: NEGATIVE
Leukocytes, UA: NEGATIVE
Nitrite, UA: NEGATIVE
Protein, UA: NEGATIVE
Spec Grav, UA: >=1.03
Urobilinogen, UA: 0.2
pH, UA: 5.5

## 2014-10-06 MED ORDER — CIPROFLOXACIN HCL 250 MG PO TABS: 250.0000 mg | ORAL_TABLET | Freq: Two times a day (BID) | ORAL | Status: DC

## 2014-10-06 NOTE — Patient Instructions (Addendum)
Check your med list to make sure it is correct and always bring an accurate list to your visits.  Call ENT about the abdominal pain on the antibiotic that you had. Take care.  Glad to see you.

## 2014-10-06 NOTE — Progress Notes (Signed)
Pre visit review using our clinic review tool, if applicable. No additional management support is needed unless otherwise documented below in the visit note.  She has persistent urinary urgency.  She was some better temporarily while on cipro.  Her vertigo sx improved while on cipro.   Seen by ENT Monday, saw Dr. Richardson Landry.  Started on an abx at that point, she thinks it may be cefdinir.  She took it once yesterday, then had abd pain and diarrhea.   No fevers.  Minimal burning with urination now, improved from prev.  She tried AZO yesterday w/o sig improvement.    Meds, vitals, and allergies reviewed.   ROS: See HPI.  Otherwise, noncontributory.  nad ncat Mmm rrr ctab Abd soft, not ttp No cva pain

## 2014-10-07 NOTE — Assessment & Plan Note (Signed)
Presumed. This is a tough situation since she had prev been on abx, ie ucx not useful.  D/w pt.  Asked her to bring all meds to OV, or at least a list.  Will try another round of cipro, with diagnostic limitations noted.  I asked patient not to hoard abx and start them before OV, though it is noted the last abx (likely cefdinir) was through ENT for sinus sx.  Advised patient to call ENT about her possible GI intolerance re: that abx and notify them of the current cipro use.  Med list given to patient. She'll update me at the end of the cipro trial.

## 2014-10-14 ENCOUNTER — Other Ambulatory Visit (INDEPENDENT_AMBULATORY_CARE_PROVIDER_SITE_OTHER): Payer: Commercial Managed Care - HMO

## 2014-10-14 DIAGNOSIS — I10 Essential (primary) hypertension: Secondary | ICD-10-CM

## 2014-10-14 LAB — COMPREHENSIVE METABOLIC PANEL
ALT: 21 U/L (ref 0–35)
AST: 22 U/L (ref 0–37)
Albumin: 4 g/dL (ref 3.5–5.2)
Alkaline Phosphatase: 89 U/L (ref 39–117)
BUN: 14 mg/dL (ref 6–23)
CO2: 27 mEq/L (ref 19–32)
Calcium: 9 mg/dL (ref 8.4–10.5)
Chloride: 106 mEq/L (ref 96–112)
Creatinine, Ser: 0.9 mg/dL (ref 0.4–1.2)
GFR: 67.45 mL/min (ref >60.00)
Glucose, Bld: 115 mg/dL — ABNORMAL HIGH (ref 70–99)
Potassium: 4.6 mEq/L (ref 3.5–5.1)
Sodium: 140 mEq/L (ref 135–145)
Total Bilirubin: 0.9 mg/dL (ref 0.2–1.2)
Total Protein: 6.6 g/dL (ref 6.0–8.3)

## 2014-10-14 LAB — LIPID PANEL
Cholesterol: 201 mg/dL — ABNORMAL HIGH (ref 0–200)
HDL: 47.5 mg/dL (ref >39.00)
LDL Cholesterol: 133 mg/dL — ABNORMAL HIGH (ref 0–99)
NonHDL: 153.5
Total CHOL/HDL Ratio: 4
Triglycerides: 101 mg/dL (ref 0.0–149.0)
VLDL: 20.2 mg/dL (ref 0.0–40.0)

## 2014-10-28 ENCOUNTER — Ambulatory Visit (INDEPENDENT_AMBULATORY_CARE_PROVIDER_SITE_OTHER): Payer: Commercial Managed Care - HMO | Admitting: Family Medicine

## 2014-10-28 ENCOUNTER — Encounter: Payer: Self-pay | Admitting: Family Medicine

## 2014-10-28 VITALS — BP 138/96 | HR 69 | Temp 97.5°F | Ht 65.0 in | Wt 209.2 lb

## 2014-10-28 DIAGNOSIS — Z0000 Encounter for general adult medical examination without abnormal findings: Secondary | ICD-10-CM

## 2014-10-28 DIAGNOSIS — F411 Generalized anxiety disorder: Secondary | ICD-10-CM

## 2014-10-28 DIAGNOSIS — Z23 Encounter for immunization: Secondary | ICD-10-CM

## 2014-10-28 DIAGNOSIS — Z7189 Other specified counseling: Secondary | ICD-10-CM

## 2014-10-28 MED ORDER — LORAZEPAM 1 MG PO TABS: ORAL_TABLET | ORAL | Status: DC

## 2014-10-28 NOTE — Progress Notes (Signed)
Pre visit review using our clinic review tool, if applicable. No additional management support is needed unless otherwise documented below in the visit note. I have personally reviewed the Medicare Annual Wellness questionnaire and have noted 1. The patient's medical and social history 2. Their use of alcohol, tobacco or illicit drugs 3. Their current medications and supplements 4. The patient's functional ability including ADL's, fall risks, home safety risks and hearing or visual             impairment. 5. Diet and physical activities 6. Evidence for depression or mood disorders  The patients weight, height, BMI have been recorded in the chart and visual acuity is per eye clinic.  I have made referrals, counseling and provided education to the patient based review of the above and I have provided the pt with a written personalized care plan for preventive services.  Provider list updated- see scanned forms (patient forgot to bring them- she had filled them out- she'll bring them back to the clinic.  D/w pt. ).  Routine anticipatory guidance given to patient.  See health maintenance. See plan.  Routine anticipatory guidance given to patient.  See health maintenance. Flu shot today Tetanus 2012 Shingles 2010 PNA 2013 Mammogram at [facility] 07/15/2014 DXA 2013 Pap not indicated, d/w pt.  Colonoscopy 2013 Living will d/w pt.  Would have her husband designated if patient were incapacitated.   Rare use of BZD, if she gets anxious. She'll take a 1/4 of a pill.  No falls.   Memory testing wnl.  Normal recall, A&O x3, can read a watch. Cognitive function addressed- see scanned forms- and if abnormal then additional documentation follows.  Mild inc in sugar.  D/w pt re: labs and diet/exercise.   PMH and SH reviewed  Meds, vitals, and allergies reviewed.   ROS: See HPI.  Otherwise negative.    GEN: nad, alert and oriented HEENT: mucous membranes moist NECK: supple w/o LA CV: rrr. 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

## 2014-10-28 NOTE — Patient Instructions (Signed)
Drop off your forms when convenient.  Take care.  Glad to see you.  Try to work on cutting back on sweets and getting a little more exercise.

## 2014-10-31 DIAGNOSIS — F411 Generalized anxiety disorder: Secondary | ICD-10-CM | POA: Insufficient documentation

## 2014-10-31 DIAGNOSIS — Z7189 Other specified counseling: Secondary | ICD-10-CM | POA: Insufficient documentation

## 2014-10-31 NOTE — Assessment & Plan Note (Signed)
Flu shot today Tetanus 2012 Shingles 2010 PNA 2013 Mammogram at [facility] 07/15/2014 DXA 2013 Pap not indicated, d/w pt.  Colonoscopy 2013 Living will d/w pt.  Would have her husband designated if patient were incapacitated.   Rare use of BZD, if she gets anxious. She'll take a 1/4 of a pill.  Okay to continue.  D/w pt.  No falls.   Memory testing wnl.  Normal recall, A&O x3, can read a watch. Cognitive function addressed- see scanned forms- and if abnormal then additional documentation follows.  Mild inc in sugar.  D/w pt re: labs and diet/exercise.

## 2014-10-31 NOTE — Assessment & Plan Note (Signed)
Rare use of BZD, if she gets anxious. She'll take a 1/4 of a pill.  Okay to continue.  D/w pt.

## 2014-12-07 ENCOUNTER — Encounter: Payer: Self-pay | Admitting: Family Medicine

## 2014-12-07 ENCOUNTER — Ambulatory Visit (INDEPENDENT_AMBULATORY_CARE_PROVIDER_SITE_OTHER): Payer: PPO | Admitting: Family Medicine

## 2014-12-07 VITALS — BP 144/84 | HR 72 | Temp 97.6°F | Wt 211.2 lb

## 2014-12-07 DIAGNOSIS — R300 Dysuria: Secondary | ICD-10-CM | POA: Insufficient documentation

## 2014-12-07 DIAGNOSIS — J3489 Other specified disorders of nose and nasal sinuses: Secondary | ICD-10-CM | POA: Insufficient documentation

## 2014-12-07 LAB — POCT URINALYSIS DIPSTICK
Bilirubin, UA: NEGATIVE
Blood, UA: NEGATIVE
Glucose, UA: NEGATIVE
Ketones, UA: NEGATIVE
Leukocytes, UA: NEGATIVE
Nitrite, UA: NEGATIVE
Protein, UA: NEGATIVE
Spec Grav, UA: 1.02
Urobilinogen, UA: 4
pH, UA: 7

## 2014-12-07 NOTE — Assessment & Plan Note (Signed)
Benign exam, she can use warm compresses for now, would try to avoid antibiotics exposure for now.  D/w pt.  Unclear about her ear clicking, could have tensor tympani sx, but with benign exam can hold off on ENT eval for now.  Doesn't appear to be related to TMJ and her occiput/mastoid exam is benign. D/w pt.

## 2014-12-07 NOTE — Progress Notes (Signed)
Pre visit review using our clinic review tool, if applicable. No additional management support is needed unless otherwise documented below in the visit note.  She had seen ENT re: sinus pressure, prev treated for sinus pain. Not on abx currently/recently.  Some occ cough, mainly from throat clearing and that is at baseline. She can get occ occiput pain.  She has occ fast clicking in the L ear, not a/w chewing.    She still gets some vertigo, some better now than prev.  Not lightheaded.   No dysuria now, but did have it a few days ago.  She has intermittent discomfort.  No discharge.  No fevers.  No upper back pain, she has lower back discomfort at baseline.   Meds, vitals, and allergies reviewed.   ROS: See HPI.  Otherwise, noncontributory.  GEN: nad, alert and oriented HEENT: mucous membranes moist, tm w/o erythema, nasal exam w/o erythema, clear discharge noted,  OP with cobblestoning, sinuses not ttp  NECK: supple w/o LA CV: rrr.   PULM: ctab, no inc wob EXT: no edema SKIN: no acute rash ABD soft, not ttp, normal BS

## 2014-12-07 NOTE — Patient Instructions (Signed)
Drink plenty of water and we'll contact you with your lab report.  Take care.   Try using a warm compress on your forehead for the pain.  That may help.

## 2014-12-07 NOTE — Assessment & Plan Note (Signed)
Off abx currently, inc fluids and check ucx.  D/w pt.  She agrees.

## 2014-12-08 LAB — URINE CULTURE: Colony Count: 40000

## 2015-03-11 DIAGNOSIS — Z85828 Personal history of other malignant neoplasm of skin: Secondary | ICD-10-CM | POA: Insufficient documentation

## 2015-03-29 ENCOUNTER — Ambulatory Visit (INDEPENDENT_AMBULATORY_CARE_PROVIDER_SITE_OTHER): Payer: PPO | Admitting: Family Medicine

## 2015-03-29 ENCOUNTER — Encounter: Payer: Self-pay | Admitting: Family Medicine

## 2015-03-29 VITALS — BP 150/88 | HR 75 | Temp 97.9°F | Wt 212.5 lb

## 2015-03-29 DIAGNOSIS — L989 Disorder of the skin and subcutaneous tissue, unspecified: Secondary | ICD-10-CM | POA: Diagnosis not present

## 2015-03-29 MED ORDER — DOXYCYCLINE HYCLATE 100 MG PO TABS: 100.0000 mg | ORAL_TABLET | Freq: Two times a day (BID) | ORAL | Status: DC

## 2015-03-29 NOTE — Patient Instructions (Signed)
Start doxycycline today.  Caution for sunburns.  Warm compresses several times a day.  Schedule a 57min visit on Thursday or Friday here for me to drain the spot.  If it gets a lot better in the meantime, then we shouldn't try to drain it and cancel the appointment.  Take care.

## 2015-03-29 NOTE — Progress Notes (Signed)
Pre visit review using our clinic review tool, if applicable. No additional management support is needed unless otherwise documented below in the visit note.  R neck with cyst, enlarged and prev more sore.  Not draining.  She tried to drain it with a needle, no result.  Only some blood expressed, but not pus.   Meds, vitals, and allergies reviewed.   ROS: See HPI.  Otherwise, noncontributory.  nad Neck with 2cm lesion, looks to be irritated epidermoid cyst.  Not that sore now, not really ttp.  Clearly separate from carotid and jugular.

## 2015-03-30 DIAGNOSIS — L989 Disorder of the skin and subcutaneous tissue, unspecified: Secondary | ICD-10-CM | POA: Insufficient documentation

## 2015-03-30 NOTE — Assessment & Plan Note (Signed)
Reassured her no reason to suspect cancer.   Can try doxy for now with warm compresses, since not very tender.  She agrees.  If not better, then we can drain in clinic.  This is clearly separate from the neck vessels and we should be able to target this.  We talked about risk/benefit of I&D.  She'll cancel the f/u for I&D if better.  Okay for outpatient f/u.

## 2015-04-01 ENCOUNTER — Encounter: Payer: Self-pay | Admitting: Family Medicine

## 2015-04-01 ENCOUNTER — Ambulatory Visit (INDEPENDENT_AMBULATORY_CARE_PROVIDER_SITE_OTHER): Payer: PPO | Admitting: Family Medicine

## 2015-04-01 VITALS — BP 142/90 | HR 76 | Temp 97.7°F | Wt 212.3 lb

## 2015-04-01 DIAGNOSIS — L723 Sebaceous cyst: Secondary | ICD-10-CM

## 2015-04-01 DIAGNOSIS — L089 Local infection of the skin and subcutaneous tissue, unspecified: Secondary | ICD-10-CM

## 2015-04-01 NOTE — Patient Instructions (Signed)
Take care.  Pull the packing on Sunday.  Keep it clean and covered for now and after the packing is out.  You can wash it gently with soap and water after the packing is out.  Take care.  Glad to see you.

## 2015-04-01 NOTE — Progress Notes (Signed)
Pre visit review using our clinic review tool, if applicable. No additional management support is needed unless otherwise documented below in the visit note.  I&D  Meds, vitals, and allergies reviewed.   Indication: R sided neck lesion, c/w infected seb scyt  Pt complaints of: erythema, pain, swelling  Location: R neck, lateral to carotid  Size: 2cm  Informed consent obtained.  Pt aware of risks not limited to but including infection, bleeding, damage to near by organs.  Prep: etoh/betadine  Anesthesia: 2%lidocaine with epi, good effect  Incision made with #11 blade  Would explored and loculations removed  Wound packed with iodoform gauze

## 2015-04-03 DIAGNOSIS — L723 Sebaceous cyst: Principal | ICD-10-CM

## 2015-04-03 DIAGNOSIS — L089 Local infection of the skin and subcutaneous tissue, unspecified: Secondary | ICD-10-CM | POA: Insufficient documentation

## 2015-04-03 NOTE — Assessment & Plan Note (Signed)
Tolerated I&D well Routine postprocedure instructions d/w pt- remove packing on 04/03/15, keep area clean and bandaged, follow up if concerns/spreading erythema/pain. Care was taken to avoid any and all nearby vascular structures and there was absolutely no complication during the procedure.  She had about 42mL of blood loss from the skin incision, as expected, but none o/w.   She left clinic in good condition.

## 2015-04-11 ENCOUNTER — Ambulatory Visit (INDEPENDENT_AMBULATORY_CARE_PROVIDER_SITE_OTHER): Payer: PPO | Admitting: Family Medicine

## 2015-04-11 ENCOUNTER — Encounter: Payer: Self-pay | Admitting: Family Medicine

## 2015-04-11 VITALS — BP 140/86 | HR 75 | Temp 97.8°F | Wt 214.5 lb

## 2015-04-11 DIAGNOSIS — R300 Dysuria: Secondary | ICD-10-CM | POA: Diagnosis not present

## 2015-04-11 LAB — POCT URINALYSIS DIPSTICK
Bilirubin, UA: NEGATIVE
Blood, UA: NEGATIVE
Glucose, UA: NEGATIVE
Ketones, UA: NEGATIVE
Nitrite, UA: NEGATIVE
Protein, UA: NEGATIVE
Spec Grav, UA: >=1.03
Urobilinogen, UA: 4
pH, UA: 6

## 2015-04-11 MED ORDER — CIPROFLOXACIN HCL 250 MG PO TABS: 250.0000 mg | ORAL_TABLET | Freq: Two times a day (BID) | ORAL | Status: DC

## 2015-04-11 NOTE — Progress Notes (Signed)
Pre visit review using our clinic review tool, if applicable. No additional management support is needed unless otherwise documented below in the visit note.  Dysuria: yes duration of symptoms: a few days abdominal pain: occ suprapubic pain Fevers:no but had some chills back pain: likely from yard work Vomiting:no Took cystex recently w/o full relief.   other concerns: her R neck seb cyst I&D resolved.  Doing well.  Flat and not ttp now.    Meds, vitals, and allergies reviewed.   ROS: See HPI.  Otherwise negative.    GEN: nad, alert and oriented HEENT: mucous membranes moist NECK: supple CV: rrr.  PULM: ctab, no inc wob ABD: soft, +bs, suprapubic area tender EXT: no edema SKIN: no acute rash BACK: no CVA pain

## 2015-04-11 NOTE — Patient Instructions (Signed)
Drink plenty of water and start the antibiotics today.  We'll contact you with your lab report.  Take care.   

## 2015-04-13 LAB — URINE CULTURE: Colony Count: 40000

## 2015-04-13 NOTE — Assessment & Plan Note (Signed)
Presumed, cipro, ucx, fu prn.  She agrees. Nontoxic.

## 2015-04-15 ENCOUNTER — Telehealth: Payer: Self-pay

## 2015-04-15 MED ORDER — CIPROFLOXACIN HCL 250 MG PO TABS: 250.0000 mg | ORAL_TABLET | Freq: Two times a day (BID) | ORAL | Status: DC

## 2015-04-15 NOTE — Telephone Encounter (Signed)
Sent.  Thanks.  F/u prn.

## 2015-04-15 NOTE — Telephone Encounter (Signed)
Pt left v/m; pt was seen on 04/11/15 and was given abx; pt has finished abx and pt thinks Dr Damita Dunnings said if that abx did not take care of UTI would refill abx without pt coming in for appt. Now pt is still burning upon urination and hurts in bladder;no frequency. Pt said usually takes 2 prescriptions of Cipro to get rid of UTI. Pt request cb today. Pt request refill to [place].

## 2015-04-15 NOTE — Telephone Encounter (Signed)
Rx called in to pharmacy. 

## 2015-05-27 ENCOUNTER — Encounter: Payer: Self-pay | Admitting: Internal Medicine

## 2015-07-08 ENCOUNTER — Ambulatory Visit (INDEPENDENT_AMBULATORY_CARE_PROVIDER_SITE_OTHER): Payer: PPO | Admitting: Family Medicine

## 2015-07-08 ENCOUNTER — Encounter: Payer: Self-pay | Admitting: Family Medicine

## 2015-07-08 VITALS — BP 120/80 | HR 72 | Temp 97.9°F | Wt 202.5 lb

## 2015-07-08 DIAGNOSIS — R300 Dysuria: Secondary | ICD-10-CM | POA: Diagnosis not present

## 2015-07-08 LAB — POCT URINALYSIS DIPSTICK
Bilirubin, UA: NEGATIVE
Blood, UA: NEGATIVE
Glucose, UA: NEGATIVE
Ketones, UA: 0.5
Leukocytes, UA: NEGATIVE
Nitrite, UA: NEGATIVE
Protein, UA: NEGATIVE
Spec Grav, UA: >=1.03
Urobilinogen, UA: 4
pH, UA: 6

## 2015-07-08 MED ORDER — OMEPRAZOLE 20 MG PO CPDR: 20.0000 mg | DELAYED_RELEASE_CAPSULE | Freq: Every day | ORAL | Status: DC

## 2015-07-08 MED ORDER — CIPROFLOXACIN HCL 250 MG PO TABS: 250.0000 mg | ORAL_TABLET | Freq: Two times a day (BID) | ORAL | Status: DC

## 2015-07-08 NOTE — Patient Instructions (Signed)
Drink plenty of water and start the antibiotics today.  Take OTC pyridium next time if needed, not the antibiotics ahead of time.  Take care.

## 2015-07-08 NOTE — Progress Notes (Signed)
Pre visit review using our clinic review tool, if applicable. No additional management support is needed unless otherwise documented below in the visit note.  Started cipro yesterday.  Burning with urination yesterday, less today.  No fevers.  HA yesterday.  No vomiting.  No back pain.  No abd pain but some heartburn in epigastrum.  No lower abd pain.   Meds, vitals, and allergies reviewed.   ROS: See HPI.  Otherwise, noncontributory.  nad ncat Mmm rrr ctab abd soft, not ttp Ext w/o edema No CVA pain.

## 2015-07-10 NOTE — Assessment & Plan Note (Signed)
Presumed UTI.  I've asked her not to hoard leftover abx.   She took some leftover cipro that she didn't use from prev.  We can't culture her urine.  I told her that if she didn't improve on the cipro, we wouldn't have culture data to direct her care.  I have repeatedly asked her not to hold onto abx and not to start them before giving a urine sample.  See avs.  Still okay for outpatient f/u.

## 2015-10-28 ENCOUNTER — Ambulatory Visit (INDEPENDENT_AMBULATORY_CARE_PROVIDER_SITE_OTHER): Payer: PPO | Admitting: Internal Medicine

## 2015-10-28 ENCOUNTER — Telehealth: Payer: Self-pay | Admitting: Family Medicine

## 2015-10-28 ENCOUNTER — Encounter: Payer: Self-pay | Admitting: Internal Medicine

## 2015-10-28 VITALS — BP 130/82 | HR 72 | Temp 97.5°F | Wt 203.0 lb

## 2015-10-28 DIAGNOSIS — R300 Dysuria: Secondary | ICD-10-CM | POA: Diagnosis not present

## 2015-10-28 DIAGNOSIS — N3090 Cystitis, unspecified without hematuria: Secondary | ICD-10-CM

## 2015-10-28 DIAGNOSIS — R609 Edema, unspecified: Secondary | ICD-10-CM | POA: Diagnosis not present

## 2015-10-28 LAB — POCT URINALYSIS DIPSTICK
Bilirubin, UA: NEGATIVE
Blood, UA: NEGATIVE
Glucose, UA: NEGATIVE
Ketones, UA: NEGATIVE
Leukocytes, UA: NEGATIVE
Nitrite, UA: NEGATIVE
Protein, UA: NEGATIVE
Spec Grav, UA: >=1.03
Urobilinogen, UA: NEGATIVE
pH, UA: 6

## 2015-10-28 NOTE — Progress Notes (Signed)
Pre visit review using our clinic review tool, if applicable. No additional management support is needed unless otherwise documented below in the visit note. 

## 2015-10-28 NOTE — Telephone Encounter (Signed)
Cancellation was available with Webb Silversmith and patient placed in that spot.

## 2015-10-28 NOTE — Telephone Encounter (Signed)
[facility] Patient Name: [name] DOB: [birth year] Initial Comment Caller states feet swelling, fluid leaking from leg. Also having bladder problems. Pain and discomfort. Nurse Assessment Nurse: Markus Daft, RN, Sherre Poot Date/Time (Eastern Time): 10/28/2015 10:02:07 AM Confirm and document reason for call. If symptomatic, describe symptoms. ---Caller states feet swelling, fluid leaking from leg. Also having bladder problems like lower abdominal pain and burning pain upon urination. -- Has had Cipro in the past for frequent UTI's. Has the patient traveled out of the country within the last 30 days? ---Not Applicable Does the patient have any new or worsening symptoms? ---Yes Will a triage be completed? ---Yes Related visit to physician within the last 2 weeks? ---No Does the PT have any chronic conditions? (i.e. diabetes, asthma, etc.) ---No Is this a behavioral health or substance abuse call? ---No Guidelines Guideline Title Affirmed Question Affirmed Notes Urination Pain - Female Age > 50 years Final Disposition User See Physician within [address] Hours [place], RN, Sherre Poot Comments She has h/o UTI and notices feet swelling in the past when she has one. RN contacted office staff and was told to msg thru to office about urine test since MD's are booked today. - Caller advised that office will call her back. she verb. understanding. Referrals REFERRED TO PCP OFFICE Disagree/Comply: Comply

## 2015-10-28 NOTE — Progress Notes (Signed)
Subjective:    Patient ID: [name], female    DOB: 1941-06-20, 74 y.o.   MRN: [id number]  HPI  Pt presents to the clinic today with c/o dysuria and lower abdominal discomfort. This started yesterday. She denies fever, chills or nausea. She denies urgency, frequency, low back pain or blood in her urine. She has not tried anything OTC. She reports she gets frequent UTI's. She denies vaginal complaints.  She also reports edema in her lower legs. They have been swelling for the last 2-3. The swelling is not painful. It seems worse as the day progresses. She denies redness or warmth. She has noticed some clear fluid draining from the back of her right leg after her cat scratched her.  Review of Systems      Past Medical History  Diagnosis Date  . Fatty liver   . Hx of adenomatous colonic polyps   . Esophageal reflux   . Pure hypercholesterolemia   . Carcinoma in situ of breast 2001  . Labyrinthitis, unspecified   . Urinary tract infection, site not specified   . Unspecified essential hypertension   . Cataract 1988, [place]  . Fibromyalgia   . Anxiety   . Arthritis   . Breast cancer ([place]) 2001    s/p rady and lumpectomy    Current Outpatient Prescriptions  Medication Sig Dispense Refill  . EPINEPHrine (EPIPEN) 0.3 mg/0.3 mL DEVI Inject 0.3 mLs (0.3 mg total) into the muscle once. 2 Device 1  . LORazepam (ATIVAN) 1 MG tablet Take 1/4-1/2 tablet by mouth every 8 hours as needed 30 tablet 0   No current facility-administered medications for this visit.    Allergies  Allergen Reactions  . Lipitor [Atorvastatin]     intolerant  . Lisinopril Other (See Comments)    Intolerant, abd pain  . Metoprolol Succinate     REACTION: Exhaustion, increased dizziness  . Nitrofurantoin     REACTION: Rash  . Sulfadiazine     REACTION: Rash  . Yellow [place]et Venom [Bee Venom]     Short of breath    Family History  Problem Relation Age of Onset  . Prostate  cancer Brother   . Heart disease Brother   . Pancreatitis Brother     x 2  . Prostate cancer Brother   . Alzheimer's disease Father   . Dementia Father   . Parkinsonism Mother   . Heart disease Brother   . Colon cancer Neg Hx   . Heart disease Brother   . Breast cancer Paternal Aunt     Social History   Social History  . Marital Status: Married    Spouse Name: N/A  . Number of Children: N/A  . Years of Education: N/A   Occupational History  . Housewife    Social History Main Topics  . Smoking status: Never Smoker   . Smokeless tobacco: Never Used  . Alcohol Use: No  . Drug Use: No  . Sexual Activity: Not on file   Other Topics Concern  . Not on file   Social History Narrative   Patient does not get regular exercise   Daily caffeine-one per day   Married 1959   Retired from [employer]: Denies fever, malaise, fatigue, headache or abrupt weight changes.  Respiratory: Denies difficulty breathing, shortness of breath, cough or sputum production.   Cardiovascular: Pt reports swelling in her legs. Denies chest pain, chest tightness, palpitations or swelling  in the hands.  Gastrointestinal: Pt reports abdominal pain. Denies  bloating, constipation, diarrhea or blood in the stool.  GU: Pt reports dysuria. Denies urgency, frequency, burning sensation, blood in urine, odor or discharge. Skin: Denies redness, rashes, lesions or ulcercations.    No other specific complaints in a complete review of systems (except as listed in HPI above).  Objective:   Physical Exam    BP 130/82 mmHg  Pulse 72  Temp(Src) 97.5 F (36.4 C) (Oral)  Wt 203 lb (92.08 kg)  SpO2 98%  Wt Readings from Last 3 Encounters:  10/28/15 203 lb (92.08 kg)  07/08/15 202 lb 8 oz (91.853 kg)  04/11/15 214 lb 8 oz (97.297 kg)    General: Appears her stated age,  in NAD. Skin:  Small round abrasion to right calf, no redness, warmth or drainage noted.  Cardiovascular: Normal rate  and rhythm. S1,S2 noted.  R>L BLE edema. Pulmonary/Chest: Normal effort and positive vesicular breath sounds. No respiratory distress. No wheezes, rales or ronchi noted.  Abdomen: Soft and nontender. Normal bowel sounds. No CVA tenderness noted. Neurological: Alert and oriented.   BMET    Component Value Date/Time   NA 140 10/14/2014 0855   K 4.6 10/14/2014 0855   CL 106 10/14/2014 0855   CO2 27 10/14/2014 0855   GLUCOSE 115* 10/14/2014 0855   BUN 14 10/14/2014 0855   CREATININE 0.9 10/14/2014 0855   CALCIUM 9.0 10/14/2014 0855   GFRNONAA 65.70 03/13/2010 0838   GFRAA 80 10/20/2007 1001    Lipid Panel     Component Value Date/Time   CHOL 201* 10/14/2014 0855   TRIG 101.0 10/14/2014 0855   HDL 47.50 10/14/2014 0855   CHOLHDL 4 10/14/2014 0855   VLDL 20.2 10/14/2014 0855   LDLCALC 133* 10/14/2014 0855    CBC    Component Value Date/Time   WBC 7.5 12/31/2012 0906   RBC 4.64 12/31/2012 0906   HGB 13.7 12/31/2012 0906   HCT 40.4 12/31/2012 0906   PLT 240.0 12/31/2012 0906   MCV 87.0 12/31/2012 0906   MCHC 34.0 12/31/2012 0906   RDW 12.7 12/31/2012 0906   LYMPHSABS 1.7 12/31/2012 0906   MONOABS 0.6 12/31/2012 0906   EOSABS 0.2 12/31/2012 0906   BASOSABS 0.1 12/31/2012 0906    Hgb A1C No results found for: HGBA1C     Assessment & Plan:   Dysuria, ? Cystitis:  Urinalysis: normal Push fluids Avoid caffeine  Peripheral edema, likely venous insufficiency  Advised her to avoid salt  Elevate legs RX give for TED hose to wear during the day, off at night She is not interested in diuretic therapy  RTC as needed or if symptoms persist or worsen

## 2015-10-28 NOTE — Patient Instructions (Signed)

## 2015-10-28 NOTE — Telephone Encounter (Signed)
Needs UC eval if not here.   No abx w/o getting checked, esp if leaking fluid from her legs.

## 2015-10-28 NOTE — Telephone Encounter (Signed)
Avie Echevaria NP had cancellation today at 1:15 pm and Morey Hummingbird called pt and scheduled appt.

## 2015-10-30 ENCOUNTER — Encounter: Payer: Self-pay | Admitting: Internal Medicine

## 2015-10-31 NOTE — Addendum Note (Signed)
Addended by: Lurlean Nanny on: 10/31/2015 04:16 PM   Modules accepted: Orders

## 2015-11-13 ENCOUNTER — Other Ambulatory Visit: Payer: Self-pay | Admitting: Family Medicine

## 2015-11-13 DIAGNOSIS — E785 Hyperlipidemia, unspecified: Secondary | ICD-10-CM

## 2015-11-18 ENCOUNTER — Other Ambulatory Visit: Payer: PPO

## 2015-11-25 ENCOUNTER — Encounter: Payer: PPO | Admitting: Family Medicine

## 2016-01-19 ENCOUNTER — Other Ambulatory Visit (INDEPENDENT_AMBULATORY_CARE_PROVIDER_SITE_OTHER): Payer: PPO

## 2016-01-19 DIAGNOSIS — E785 Hyperlipidemia, unspecified: Secondary | ICD-10-CM

## 2016-01-19 LAB — COMPREHENSIVE METABOLIC PANEL
ALT: 14 U/L (ref 0–35)
AST: 16 U/L (ref 0–37)
Albumin: 4.3 g/dL (ref 3.5–5.2)
Alkaline Phosphatase: 91 U/L (ref 39–117)
BUN: 14 mg/dL (ref 6–23)
CO2: 28 mEq/L (ref 19–32)
Calcium: 9.5 mg/dL (ref 8.4–10.5)
Chloride: 107 mEq/L (ref 96–112)
Creatinine, Ser: 0.89 mg/dL (ref 0.40–1.20)
GFR: 65.49 mL/min (ref >60.00)
Glucose, Bld: 110 mg/dL — ABNORMAL HIGH (ref 70–99)
Potassium: 4.6 mEq/L (ref 3.5–5.1)
Sodium: 141 mEq/L (ref 135–145)
Total Bilirubin: 1 mg/dL (ref 0.2–1.2)
Total Protein: 7.3 g/dL (ref 6.0–8.3)

## 2016-01-19 LAB — LIPID PANEL
Cholesterol: 207 mg/dL — ABNORMAL HIGH (ref 0–200)
HDL: 53.8 mg/dL (ref >39.00)
LDL Cholesterol: 137 mg/dL — ABNORMAL HIGH (ref 0–99)
NonHDL: 152.93
Total CHOL/HDL Ratio: 4
Triglycerides: 79 mg/dL (ref 0.0–149.0)
VLDL: 15.8 mg/dL (ref 0.0–40.0)

## 2016-01-27 ENCOUNTER — Encounter: Payer: PPO | Admitting: Family Medicine

## 2016-02-24 ENCOUNTER — Encounter: Payer: Self-pay | Admitting: Family Medicine

## 2016-02-24 ENCOUNTER — Ambulatory Visit (INDEPENDENT_AMBULATORY_CARE_PROVIDER_SITE_OTHER): Payer: PPO | Admitting: Family Medicine

## 2016-02-24 VITALS — BP 142/84 | HR 71 | Temp 97.6°F | Ht 65.0 in | Wt 204.2 lb

## 2016-02-24 DIAGNOSIS — Z0000 Encounter for general adult medical examination without abnormal findings: Secondary | ICD-10-CM | POA: Diagnosis not present

## 2016-02-24 DIAGNOSIS — Z7189 Other specified counseling: Secondary | ICD-10-CM

## 2016-02-24 DIAGNOSIS — Z23 Encounter for immunization: Secondary | ICD-10-CM

## 2016-02-24 DIAGNOSIS — R1012 Left upper quadrant pain: Secondary | ICD-10-CM | POA: Diagnosis not present

## 2016-02-24 MED ORDER — OMEPRAZOLE 20 MG PO CPDR
20.0000 mg | DELAYED_RELEASE_CAPSULE | Freq: Every day | ORAL | Status: DC
Start: 2016-02-24 — End: 2016-06-04

## 2016-02-24 MED ORDER — ACETAMINOPHEN 325 MG PO TABS
650.0000 mg | ORAL_TABLET | Freq: Three times a day (TID) | ORAL | Status: AC | PRN
Start: 2016-02-24 — End: ?

## 2016-02-24 NOTE — Progress Notes (Signed)
Pre visit review using our clinic review tool, if applicable. No additional management support is needed unless otherwise documented below in the visit note.  I have personally reviewed the Medicare Annual Wellness questionnaire and have noted 1. The patient's medical and social history 2. Their use of alcohol, tobacco or illicit drugs 3. Their current medications and supplements 4. The patient's functional ability including ADL's, fall risks, home safety risks and hearing or visual             impairment. 5. Diet and physical activities 6. Evidence for depression or mood disorders  The patients weight, height, BMI have been recorded in the chart and visual acuity is per eye clinic.  I have made referrals, counseling and provided education to the patient based review of the above and I have provided the pt with a written personalized care plan for preventive services.  Provider list updated- see scanned forms.  Routine anticipatory guidance given to patient.  See health maintenance.  Flu encouraged Shingles 2010 PNA 2017 Tetanus 2012 Colonoscopy 2013 Breast cancer screening due, d/w pt.  DXA defer for now, given her other concerns.  She agrees.  Advance directive- husband then daughter designated.  Cognitive function addressed- see scanned forms- and if abnormal then additional documentation follows.   She has recently has some LUQ discomfort and some nausea.  She has h/o gastritis on EGD.  Was prev on PPI.  Prev with path report:BENIGN ESOPHAGEAL AND GASTRIC TYPE MUCOSA WITH CHRONICINFLAMMATION. She has been eating late at night. Known HH from prev imaging.   She has some diffuse aches.  No true vertigo.  H/o fibromyalgia.  Worse with inc in stress recently- mult family members have been ill, including her husband.  She is worried about him. 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   PMH and SH reviewed  Meds,  vitals, and allergies reviewed.   ROS: See HPI.  Otherwise negative.    GEN: nad, alert and oriented HEENT: mucous membranes moist NECK: supple w/o LA CV: rrr. PULM: ctab, no inc wob ABD: soft, +bs, minimally tender in the LUQ.  EXT: no edema SKIN: no acute rash

## 2016-02-24 NOTE — Patient Instructions (Addendum)
Call about a mammogram when you can, along with the eye appointment.  Add back the omeprazole and see if that helps.  I would try taking tylenol for the pain. Take care.  Glad to see you.  I would get a flu shot each fall.

## 2016-02-27 DIAGNOSIS — R1012 Left upper quadrant pain: Secondary | ICD-10-CM | POA: Insufficient documentation

## 2016-02-27 NOTE — Assessment & Plan Note (Signed)
Known HH and h/o gastritis from prev eval.  Avoid nsaids and add back PPI, update me as needed.  Benign exam.  D/w pt.  She agrees.

## 2016-02-27 NOTE — Assessment & Plan Note (Signed)
Flu encouraged Shingles 2010 PNA 2017 Tetanus 2012 Colonoscopy 2013 Breast cancer screening due, d/w pt.  DXA defer for now, given her other concerns.  She agrees.  Advance directive- husband then daughter designated.  Cognitive function addressed- see scanned forms- and if abnormal then additional documentation follows.  I think that some of her aches are likely from her current work load, ie mowing grass, housework, etc. She doesn't have specific joint aches or other trauma.  Okay to try tylenol for pain, d/w pt.

## 2016-05-26 DIAGNOSIS — N3001 Acute cystitis with hematuria: Secondary | ICD-10-CM | POA: Diagnosis not present

## 2016-06-04 ENCOUNTER — Ambulatory Visit (INDEPENDENT_AMBULATORY_CARE_PROVIDER_SITE_OTHER): Payer: PPO | Admitting: Family Medicine

## 2016-06-04 ENCOUNTER — Encounter: Payer: Self-pay | Admitting: Family Medicine

## 2016-06-04 VITALS — BP 142/82 | HR 70 | Temp 97.7°F | Wt 206.2 lb

## 2016-06-04 DIAGNOSIS — R21 Rash and other nonspecific skin eruption: Secondary | ICD-10-CM

## 2016-06-04 DIAGNOSIS — L989 Disorder of the skin and subcutaneous tissue, unspecified: Secondary | ICD-10-CM

## 2016-06-04 DIAGNOSIS — L729 Follicular cyst of the skin and subcutaneous tissue, unspecified: Secondary | ICD-10-CM | POA: Diagnosis not present

## 2016-06-04 DIAGNOSIS — R300 Dysuria: Secondary | ICD-10-CM

## 2016-06-04 DIAGNOSIS — N390 Urinary tract infection, site not specified: Secondary | ICD-10-CM

## 2016-06-04 LAB — POC URINALSYSI DIPSTICK (AUTOMATED)
Bilirubin, UA: NEGATIVE
Blood, UA: NEGATIVE
Glucose, UA: NEGATIVE
Ketones, UA: NEGATIVE
Leukocytes, UA: NEGATIVE
Nitrite, UA: NEGATIVE
Protein, UA: NEGATIVE
Spec Grav, UA: >=1.03
Urobilinogen, UA: 4
pH, UA: 5.5

## 2016-06-04 MED ORDER — TRIAMCINOLONE ACETONIDE 0.1 % EX CREA: 1.0000 "application " | TOPICAL_CREAM | Freq: Two times a day (BID) | CUTANEOUS | Status: DC | PRN

## 2016-06-04 NOTE — Progress Notes (Signed)
Pre visit review using our clinic review tool, if applicable. No additional management support is needed unless otherwise documented below in the visit note.  Was seen at [facility] about 10 days ago.  She was burning with urination, seen for presumed UTI, started on keflex.  Stopped it about 4 days ago.  Itchy rash on the back in the meantime- that is why she stopped the medicine.  Still burning some with urination, but not awfully.  Minimal discomfort, but better than prev.    U/a wnl today at [place].   D/w pt at [facility].   No lip or airway sx.    Rash isn't painful.  B upper back.  No new soaps or detergents, other than detergent with oxyclean (and she had used in the past w/o troubles).  Also with itchy pigmented area on the medial R lower leg.   Meds, vitals, and allergies reviewed.   ROS: Per HPI unless specifically indicated in ROS section   nad ncat OP wnl Neck supple, nontender cyst noted on R lower neck. rrr Ctab.   Blanching maculopapular rash on the B upper back, not dermatomal.  Chronic hyperpigmentation on the R medial lower leg.  No ulceration.  No stridor, no lip swelling.

## 2016-06-04 NOTE — Patient Instructions (Addendum)
Rosaria Ferries will call about your referral for the skin clinic.  Use the cream on the itchy areas.  Stay off the keflex for now.  I'm not sure the medicine caused the rash.  Take care.  Glad to see you.

## 2016-06-05 DIAGNOSIS — L729 Follicular cyst of the skin and subcutaneous tissue, unspecified: Secondary | ICD-10-CM | POA: Insufficient documentation

## 2016-06-05 DIAGNOSIS — R21 Rash and other nonspecific skin eruption: Secondary | ICD-10-CM | POA: Insufficient documentation

## 2016-06-05 NOTE — Assessment & Plan Note (Signed)
Unclear if from keflex.  Stop keflex in meantime.  Start TAC for the rash, since it is still itchy.

## 2016-06-05 NOTE — Assessment & Plan Note (Signed)
Stop keflex for now, hold off on further tx for now, with u/a d/w pt at [place].  Okay for outpatient f/u.

## 2016-06-05 NOTE — Assessment & Plan Note (Signed)
Refer to derm.  Doesn't appear infected.

## 2016-06-12 DIAGNOSIS — L821 Other seborrheic keratosis: Secondary | ICD-10-CM | POA: Diagnosis not present

## 2016-06-12 DIAGNOSIS — L853 Xerosis cutis: Secondary | ICD-10-CM | POA: Diagnosis not present

## 2016-06-12 DIAGNOSIS — L728 Other follicular cysts of the skin and subcutaneous tissue: Secondary | ICD-10-CM | POA: Diagnosis not present

## 2016-08-30 DIAGNOSIS — H353131 Nonexudative age-related macular degeneration, bilateral, early dry stage: Secondary | ICD-10-CM | POA: Diagnosis not present

## 2016-09-26 DIAGNOSIS — H353131 Nonexudative age-related macular degeneration, bilateral, early dry stage: Secondary | ICD-10-CM | POA: Diagnosis not present

## 2016-11-07 ENCOUNTER — Ambulatory Visit (INDEPENDENT_AMBULATORY_CARE_PROVIDER_SITE_OTHER): Payer: PPO | Admitting: Family Medicine

## 2016-11-07 ENCOUNTER — Encounter: Payer: Self-pay | Admitting: Family Medicine

## 2016-11-07 VITALS — BP 130/80 | HR 76 | Temp 97.7°F | Wt 200.5 lb

## 2016-11-07 DIAGNOSIS — R300 Dysuria: Secondary | ICD-10-CM | POA: Diagnosis not present

## 2016-11-07 DIAGNOSIS — Z23 Encounter for immunization: Secondary | ICD-10-CM

## 2016-11-07 DIAGNOSIS — R319 Hematuria, unspecified: Secondary | ICD-10-CM

## 2016-11-07 LAB — POC URINALSYSI DIPSTICK (AUTOMATED)
Bilirubin, UA: NEGATIVE
Blood, UA: NEGATIVE
Glucose, UA: NEGATIVE
Ketones, UA: NEGATIVE
Leukocytes, UA: NEGATIVE
Nitrite, UA: NEGATIVE
Protein, UA: NEGATIVE
Spec Grav, UA: 1.025
Urobilinogen, UA: 0.2
pH, UA: 6

## 2016-11-07 MED ORDER — PHENAZOPYRIDINE HCL 97.2 MG PO TABS: 97.0000 mg | ORAL_TABLET | Freq: Three times a day (TID) | ORAL | 0 refills | Status: DC | PRN

## 2016-11-07 NOTE — Progress Notes (Signed)
Subjective:    Patient ID: [name], female    DOB: Jan 30, 1941, 75 y.o.   MRN: [id number]  HPI This is a 75 yo female who presents today with 1.5 weeks of dysuria and hematuria. Currently having mild dysuria, occasional lower abdominal pressure, no fever, no back pain. No nausea or vomiting. Hematuria has resolved. Took one keflex that she had at home. Admits poor water intake. Nocturia 1-2 x last several night. When symptoms first started, was going 6+ times a night.  Her brother has been hospitalized with a stoke and she has been really tied up with him. He is in a rehab facility and is not eating and is losing weight.   Past Medical History:  Diagnosis Date  . Anxiety   . Arthritis   . Breast cancer ([place]) 2001   s/p rady and lumpectomy  . Carcinoma in situ of breast 2001  . Cataract 1988, [place]  . Esophageal reflux   . Fatty liver   . Fibromyalgia   . Hx of adenomatous colonic polyps   . Labyrinthitis, unspecified   . Pure hypercholesterolemia   . Unspecified essential hypertension   . Urinary tract infection, site not specified    Past Surgical History:  Procedure Laterality Date  . BREAST BIOPSY  07/2000   positive right side, [facility]   . BREAST LUMPECTOMY  08/20/2000   right breast, malignant stage I, [facility]  . CATARACT EXTRACTION  1988, 1990  . FOOT SURGERY  1980   minor    Family History  Problem Relation Age of Onset  . Prostate cancer Brother   . Heart disease Brother   . Pancreatitis Brother     x 2  . Prostate cancer Brother   . Alzheimer's disease Father   . Dementia Father   . Parkinsonism Mother   . Heart disease Brother   . Colon cancer Neg Hx   . Heart disease Brother   . Breast cancer Paternal Aunt    Social History  Substance Use Topics  . Smoking status: Never Smoker  . Smokeless tobacco: Never Used  . Alcohol use No      Review of Systems Per HPI    Objective:   Physical Exam  Constitutional: She is oriented to  person, place, and time. She appears well-developed and well-nourished. No distress.  HENT:  Head: Normocephalic and atraumatic.  Eyes: Conjunctivae are normal.  Cardiovascular: Normal rate, regular rhythm and normal heart sounds.   Pulmonary/Chest: Effort normal and breath sounds normal.  Abdominal: Soft. She exhibits no distension. There is no tenderness. There is no rebound, no guarding and no CVA tenderness.  Neurological: She is alert and oriented to person, place, and time.  Skin: Skin is warm and dry. She is not diaphoretic.  Psychiatric: She has a normal mood and affect. Her behavior is normal. Judgment and thought content normal.  Vitals reviewed.     BP 130/80   Pulse 76   Temp 97.7 F (36.5 C) (Oral)   Wt 200 lb 8 oz (90.9 kg)   BMI 33.36 kg/m  Wt Readings from Last 3 Encounters:  11/07/16 200 lb 8 oz (90.9 kg)  06/04/16 206 lb 4 oz (93.6 kg)  02/24/16 204 lb 4 oz (92.6 kg)   Results for orders placed or performed in visit on 11/07/16  POCT Urinalysis Dipstick (Automated)  Result Value Ref Range   Color, UA Straw    Clarity, UA Clear  Glucose, UA Negative    Bilirubin, UA Negative    Ketones, UA Negative    Spec Grav, UA 1.025    Blood, UA Negative    pH, UA 6.0    Protein, UA Negative    Urobilinogen, UA 0.2    Nitrite, UA Negative    Leukocytes, UA Negative Negative        Assessment & Plan:  1. Hematuria, unspecified type - this has resolved and urine dipstick negative for blood today - POCT Urinalysis Dipstick (Automated) - Urine culture  2. Dysuria - in light of her multiple allergies, clear urine on dipstick and absence of fever/chills/back pain and improvement of symptoms, will check culture to determine if antibiotic is needed - encouraged her to increase fluid intake - phenazopyridine (PYRIDIUM) 97 MG tablet; Take 1 tablet (97 mg total) by mouth 3 (three) times daily as needed for pain.  Dispense: 10 tablet; Refill: 0 - Urine  culture   Clarene Reamer, FNP-BC  [facility]  11/07/2016 8:34 AM

## 2016-11-07 NOTE — Progress Notes (Signed)
Pre visit review using our clinic review tool, if applicable. No additional management support is needed unless otherwise documented below in the visit note. 

## 2016-11-07 NOTE — Addendum Note (Signed)
Addended by: Royann Shivers A on: 11/07/2016 08:49 AM   Modules accepted: Orders

## 2016-11-07 NOTE — Patient Instructions (Signed)
I have sent in a prescription to your pharmacy to help with the burning The most important thing is to drink enough water to make your urine light yellow Please let us know if you develop a fever over 100 or is you have more blood in your urine or pain

## 2016-11-09 LAB — URINE CULTURE

## 2016-11-10 ENCOUNTER — Other Ambulatory Visit: Payer: Self-pay | Admitting: Family Medicine

## 2016-11-10 MED ORDER — CIPROFLOXACIN HCL 250 MG PO TABS: 250.0000 mg | ORAL_TABLET | Freq: Two times a day (BID) | ORAL | 0 refills | Status: DC

## 2016-11-23 ENCOUNTER — Ambulatory Visit (INDEPENDENT_AMBULATORY_CARE_PROVIDER_SITE_OTHER): Payer: PPO | Admitting: Internal Medicine

## 2016-11-23 ENCOUNTER — Ambulatory Visit: Payer: PPO | Admitting: Family Medicine

## 2016-11-23 ENCOUNTER — Encounter: Payer: Self-pay | Admitting: Internal Medicine

## 2016-11-23 VITALS — BP 132/80 | HR 68 | Temp 97.7°F | Wt 201.5 lb

## 2016-11-23 DIAGNOSIS — R21 Rash and other nonspecific skin eruption: Secondary | ICD-10-CM

## 2016-11-23 DIAGNOSIS — R300 Dysuria: Secondary | ICD-10-CM

## 2016-11-23 MED ORDER — CLOBETASOL PROPIONATE 0.05 % EX OINT: 1.0000 "application " | TOPICAL_OINTMENT | Freq: Two times a day (BID) | CUTANEOUS | 0 refills | Status: DC

## 2016-11-23 NOTE — Patient Instructions (Signed)
Dysuria Introduction Dysuria is pain or discomfort while urinating. The pain or discomfort may be felt in the tube that carries urine out of the bladder (urethra) or in the surrounding tissue of the genitals. The pain may also be felt in the groin area, lower abdomen, and lower back. You may have to urinate frequently or have the sudden feeling that you have to urinate (urgency). Dysuria can affect both men and women, but is more common in women. Dysuria can be caused by many different things, including:  Urinary tract infection in women.  Infection of the kidney or bladder.  Kidney stones or bladder stones.  Certain sexually transmitted infections (STIs), such as chlamydia.  Dehydration.  Inflammation of the vagina.  Use of certain medicines.  Use of certain soaps or scented products that cause irritation. Follow these instructions at home: Watch your dysuria for any changes. The following actions may help to reduce any discomfort you are feeling:  Drink enough fluid to keep your urine clear or pale yellow.  Empty your bladder often. Avoid holding urine for long periods of time.  After a bowel movement or urination, women should cleanse from front to back, using each tissue only once.  Empty your bladder after sexual intercourse.  Take medicines only as directed by your health care provider.  If you were prescribed an antibiotic medicine, finish it all even if you start to feel better.  Avoid caffeine, tea, and alcohol. They can irritate the bladder and make dysuria worse. In men, alcohol may irritate the prostate.  Keep all follow-up visits as directed by your health care provider. This is important.  If you had any tests done to find the cause of dysuria, it is your responsibility to obtain your test results. Ask the lab or department performing the test when and how you will get your results. Talk with your health care provider if you have any questions about your  results. Contact a health care provider if:  You develop pain in your back or sides.  You have a fever.  You have nausea or vomiting.  You have blood in your urine.  You are not urinating as often as you usually do. Get help right away if:  You pain is severe and not relieved with medicines.  You are unable to hold down any fluids.  You or someone else notices a change in your mental function.  You have a rapid heartbeat at rest.  You have shaking or chills.  You feel extremely weak. This information is not intended to replace advice given to you by your health care provider. Make sure you discuss any questions you have with your health care provider. Document Released: 08/10/2004 Document Revised: 04/19/2016 Document Reviewed: 07/08/2014  2017 Elsevier  

## 2016-11-23 NOTE — Progress Notes (Signed)
HPI  Pt presents to the clinic today with c/o burning with urination and dysuria. This started 4 weeks ago. She was seen 12/13 for the same. Urinalysis was normal but urine culture grew out 10-50000 colonies of Enterobacter Cloacae. She was treated with Cipro, which reports did seem to help, but after she came off the antibiotics, her symptoms returned. She does tell me that she never took the complete course of antibiotics, and did take one of the leftover pills yesterday. She denies fever, chills, nausea, low back pain or blood in her urine. She denies vaginal complaints.  She also reports a rash on her arms and legs. This rash has been there for the last 6 months or more. The rash is very itchy. It has not spread. No on in her home has a similar rash. She has not had any new exposure to pets. She denies changes in soaps, lotions or detergents. She denies changes in medication. Her skin is very dry, and she does not use any moisturizing cream consistently. She has tried Kenalog cream with minimal relief.     Review of Systems  Past Medical History:  Diagnosis Date  . Anxiety   . Arthritis   . Breast cancer ([place]) 2001   s/p rady and lumpectomy  . Carcinoma in situ of breast 2001  . Cataract 1988, [place]  . Esophageal reflux   . Fatty liver   . Fibromyalgia   . Hx of adenomatous colonic polyps   . Labyrinthitis, unspecified   . Pure hypercholesterolemia   . Unspecified essential hypertension   . Urinary tract infection, site not specified     Family History  Problem Relation Age of Onset  . Prostate cancer Brother   . Heart disease Brother   . Pancreatitis Brother     x 2  . Prostate cancer Brother   . Alzheimer's disease Father   . Dementia Father   . Parkinsonism Mother   . Heart disease Brother   . Colon cancer Neg Hx   . Heart disease Brother   . Breast cancer Paternal Aunt     Social History   Social History  . Marital status: Married    Spouse name:  N/A  . Number of children: N/A  . Years of education: N/A   Occupational History  . Housewife    Social History Main Topics  . Smoking status: Never Smoker  . Smokeless tobacco: Never Used  . Alcohol use No  . Drug use: No  . Sexual activity: Not on file   Other Topics Concern  . Not on file   Social History Narrative   Patient does not get regular exercise   Daily caffeine-one per day   Married 1959   Retired from [employer]  . Keflex [Cephalexin]     Possible cause of rash 05/2016  . Lipitor [Atorvastatin]     intolerant  . Lisinopril Other (See Comments)    Intolerant, abd pain  . Metoprolol Succinate     REACTION: Exhaustion, increased dizziness  . Nitrofurantoin     REACTION: Rash  . Sulfadiazine     REACTION: Rash  . Yellow Jacket Venom [Bee Venom]     Short of breath     Constitutional: Denies fever, malaise, fatigue, headache or abrupt weight changes.   GU: Pt reports burning sensation frequency and pain with urination. Denies urgency, blood in urine, odor or discharge. Skin: Pt reports rash  on arms and legs.  No other specific complaints in a complete review of systems (except as listed in HPI above).    Objective:   Physical Exam  BP 132/80   Pulse 68   Temp 97.7 F (36.5 C) (Oral)   Wt 201 lb 8 oz (91.4 kg)   SpO2 98%   BMI 33.53 kg/m   Wt Readings from Last 3 Encounters:  11/23/16 201 lb 8 oz (91.4 kg)  11/07/16 200 lb 8 oz (90.9 kg)  06/04/16 206 lb 4 oz (93.6 kg)    General: Appears her stated age, well developed, well nourished in NAD. Cardiovascular: Normal rate and rhythm. S1,S2 noted.   Pulmonary/Chest: Normal effort and positive vesicular breath sounds. No respiratory distress. No wheezes, rales or ronchi noted.  Abdomen: Soft. Normal bowel sounds. No distention or masses noted. Tender to palpation over the bladder area. No CVA tenderness. Pelvic: She declines. Skin: Scattered maculpapular patches  noted around ankles, excoriation noted on shins/calves.       Assessment & Plan:   Dysuria  Urinalysis: normal Will send urine culture- no ABX unless culture positive Drink plenty of fluids Continue Pyridium as needed  Rash:  She declines eRx for Prednisone  eRx for Clobetasol cream Start Allegra OTC Stop Kenalog If persist, she may want to follow up with dermatology  RTC as needed or if symptoms persist. Webb Silversmith, NP

## 2016-11-27 LAB — POC URINALSYSI DIPSTICK (AUTOMATED)
Bilirubin, UA: NEGATIVE
Blood, UA: NEGATIVE
Glucose, UA: NEGATIVE
Ketones, UA: NEGATIVE
Leukocytes, UA: NEGATIVE
Nitrite, UA: NEGATIVE
Protein, UA: NEGATIVE
Spec Grav, UA: >=1.03
Urobilinogen, UA: NEGATIVE
pH, UA: 5.5

## 2016-11-27 NOTE — Addendum Note (Signed)
Addended by: Lurlean Nanny on: 11/27/2016 08:05 AM   Modules accepted: Orders

## 2016-11-28 ENCOUNTER — Other Ambulatory Visit: Payer: PPO

## 2016-11-28 ENCOUNTER — Telehealth: Payer: Self-pay

## 2016-11-28 NOTE — Addendum Note (Signed)
Addended by: Lurlean Nanny on: 11/28/2016 04:58 PM   Modules accepted: Orders

## 2016-11-28 NOTE — Telephone Encounter (Signed)
Pt states that she was here last week with burning and dysuria, she would like to know the results of her UA culture. She has slightly improved, at times she still has pain/burning when urinating. Thanks.

## 2016-11-28 NOTE — Telephone Encounter (Signed)
Urine culture was not resulted. Does she want to come leave another sample for [place] to test?

## 2016-11-28 NOTE — Telephone Encounter (Signed)
Pt is aware as instructed and states she will come tomorrow to provide sample and urine culture future ordered

## 2017-02-25 ENCOUNTER — Ambulatory Visit: Payer: PPO

## 2017-02-25 ENCOUNTER — Other Ambulatory Visit: Payer: PPO

## 2017-03-04 ENCOUNTER — Encounter: Payer: PPO | Admitting: Family Medicine

## 2017-03-15 ENCOUNTER — Ambulatory Visit: Payer: PPO

## 2017-03-22 DIAGNOSIS — T07XXXA Unspecified multiple injuries, initial encounter: Secondary | ICD-10-CM | POA: Diagnosis not present

## 2017-03-22 DIAGNOSIS — L03221 Cellulitis of neck: Secondary | ICD-10-CM | POA: Diagnosis not present

## 2017-04-19 ENCOUNTER — Encounter: Payer: Self-pay | Admitting: Gastroenterology

## 2017-04-23 ENCOUNTER — Other Ambulatory Visit: Payer: Self-pay | Admitting: Family Medicine

## 2017-04-23 DIAGNOSIS — E785 Hyperlipidemia, unspecified: Secondary | ICD-10-CM

## 2017-04-23 DIAGNOSIS — E559 Vitamin D deficiency, unspecified: Secondary | ICD-10-CM

## 2017-04-23 DIAGNOSIS — R739 Hyperglycemia, unspecified: Secondary | ICD-10-CM

## 2017-04-24 ENCOUNTER — Ambulatory Visit: Payer: PPO

## 2017-04-26 ENCOUNTER — Ambulatory Visit (INDEPENDENT_AMBULATORY_CARE_PROVIDER_SITE_OTHER): Payer: PPO

## 2017-04-26 VITALS — BP 128/80 | HR 81 | Temp 97.7°F | Ht 63.75 in | Wt 199.8 lb

## 2017-04-26 DIAGNOSIS — E785 Hyperlipidemia, unspecified: Secondary | ICD-10-CM

## 2017-04-26 DIAGNOSIS — R739 Hyperglycemia, unspecified: Secondary | ICD-10-CM

## 2017-04-26 DIAGNOSIS — E559 Vitamin D deficiency, unspecified: Secondary | ICD-10-CM | POA: Diagnosis not present

## 2017-04-26 DIAGNOSIS — Z0000 Encounter for general adult medical examination without abnormal findings: Secondary | ICD-10-CM

## 2017-04-26 LAB — BASIC METABOLIC PANEL
BUN: 14 mg/dL (ref 6–23)
CO2: 28 mEq/L (ref 19–32)
Calcium: 9.5 mg/dL (ref 8.4–10.5)
Chloride: 107 mEq/L (ref 96–112)
Creatinine, Ser: 0.92 mg/dL (ref 0.40–1.20)
GFR: 62.82 mL/min (ref >60.00)
Glucose, Bld: 103 mg/dL — ABNORMAL HIGH (ref 70–99)
Potassium: 4.8 mEq/L (ref 3.5–5.1)
Sodium: 141 mEq/L (ref 135–145)

## 2017-04-26 LAB — LIPID PANEL
Cholesterol: 178 mg/dL (ref 0–200)
HDL: 47.8 mg/dL (ref >39.00)
LDL Cholesterol: 115 mg/dL — ABNORMAL HIGH (ref 0–99)
NonHDL: 130.21
Total CHOL/HDL Ratio: 4
Triglycerides: 77 mg/dL (ref 0.0–149.0)
VLDL: 15.4 mg/dL (ref 0.0–40.0)

## 2017-04-26 LAB — VITAMIN D 25 HYDROXY (VIT D DEFICIENCY, FRACTURES): VITD: 11.83 ng/mL — ABNORMAL LOW (ref 30.00–100.00)

## 2017-04-26 LAB — HEMOGLOBIN A1C: Hgb A1c MFr Bld: 5.6 % (ref 4.6–6.5)

## 2017-04-26 NOTE — Progress Notes (Signed)
   Subjective:    Patient ID: [name], female    DOB: [birth year], 76 y.o.   MRN: [id number]  HPI I reviewed health advisor's note, was available for consultation, and agree with documentation and plan.    Review of Systems     Objective:   Physical Exam        Assessment & Plan:

## 2017-04-26 NOTE — Patient Instructions (Signed)
[name] , Thank you for taking time to come for your Medicare Wellness Visit. I appreciate your ongoing commitment to your health goals. Please review the following plan we discussed and let me know if I can assist you in the future.   These are the goals we discussed: Goals    . Increase water intake          Starting 04/26/17, I will attempt to drink at least 6-8 glasses daily.        This is a list of the screening recommended for you and due dates:  Health Maintenance  Topic Date Due  . Colon Cancer Screening  05/30/2017  . Flu Shot  06/26/2017  . Tetanus Vaccine  03/14/2021  . DEXA scan (bone density measurement)  Completed  . Pneumonia vaccines  Completed   .Preventive Care for Adults  A healthy lifestyle and preventive care can promote health and wellness. Preventive health guidelines for adults include the following key practices.  . A routine yearly physical is a good way to check with your health care provider about your health and preventive screening. It is a chance to share any concerns and updates on your health and to receive a thorough exam.  . Visit your dentist for a routine exam and preventive care every 6 months. Brush your teeth twice a day and floss once a day. Good oral hygiene prevents tooth decay and gum disease.  . The frequency of eye exams is based on your age, health, family medical history, use  of contact lenses, and other factors. Follow your health care provider's ecommendations for frequency of eye exams.  . Eat a healthy diet. Foods like vegetables, fruits, whole grains, low-fat dairy products, and lean protein foods contain the nutrients you need without too many calories. Decrease your intake of foods high in solid fats, added sugars, and salt. Eat the right amount of calories for you. Get information about a proper diet from your health care provider, if necessary.  . Regular physical exercise is one of the most important things you can do for  your health. Most adults should get at least 150 minutes of moderate-intensity exercise (any activity that increases your heart rate and causes you to sweat) each week. In addition, most adults need muscle-strengthening exercises on 2 or more days a week.  Silver Sneakers may be a benefit available to you. To determine eligibility, you may visit the website: www.silversneakers.com or contact program at 937-701-5574 Mon-Fri between 8AM-8PM.   . Maintain a healthy weight. The body mass index (BMI) is a screening tool to identify possible weight problems. It provides an estimate of body fat based on height and weight. Your health care provider can find your BMI and can help you achieve or maintain a healthy weight.   For adults 20 years and older: ? A BMI below 18.5 is considered underweight. ? A BMI of 18.5 to 24.9 is normal. ? A BMI of 25 to 29.9 is considered overweight. ? A BMI of 30 and above is considered obese.   . Maintain normal blood lipids and cholesterol levels by exercising and minimizing your intake of saturated fat. Eat a balanced diet with plenty of fruit and vegetables. Blood tests for lipids and cholesterol should begin at age 25 and be repeated every 5 years. If your lipid or cholesterol levels are high, you are over 50, or you are at high risk for heart disease, you may need your cholesterol levels checked more frequently.  Ongoing high lipid and cholesterol levels should be treated with medicines if diet and exercise are not working.  . If you smoke, find out from your health care provider how to quit. If you do not use tobacco, please do not start.  . If you choose to drink alcohol, please do not consume more than 2 drinks per day. One drink is considered to be 12 ounces (355 mL) of beer, 5 ounces (148 mL) of wine, or 1.5 ounces (44 mL) of liquor.  . If you are 24-62 years old, ask your health care provider if you should take aspirin to prevent strokes.  . Use sunscreen.  Apply sunscreen liberally and repeatedly throughout the day. You should seek shade when your shadow is shorter than you. Protect yourself by wearing long sleeves, pants, a wide-brimmed hat, and sunglasses year round, whenever you are outdoors.  . Once a month, do a whole body skin exam, using a mirror to look at the skin on your back. Tell your health care provider of new moles, moles that have irregular borders, moles that are larger than a pencil eraser, or moles that have changed in shape or color.

## 2017-04-26 NOTE — Progress Notes (Signed)
PCP notes:   Health maintenance:  No gaps identified.  Abnormal screenings:   Hearing - failed Fall risk - hx of fall without injury or medical treatment  Patient concerns:   Knee pain - pt reports pain and swelling in right knee. Pt desires a consult with Dr. Lorelei Pont.  Feet - pt reports bunions and bilateral foot pain. Pt desires a referral to a podiatrist.  Colon cancer screening - PCP is asked to address if pt needs to complete another screening   Nurse concerns:  None  Next PCP appt:   05/02/17 @ 1045

## 2017-04-26 NOTE — Progress Notes (Signed)
Subjective:   [name] is a 76 y.o. female who presents for Medicare Annual (Subsequent) preventive examination.  Review of Systems:  N/A Cardiac Risk Factors include: advanced age (>54men, >41 women);obesity (BMI >30kg/m2);hypertension     Objective:     Vitals: BP 128/80 (BP Location: Left Arm, Patient Position: Sitting, Cuff Size: Large)   Pulse 81   Temp 97.7 F (36.5 C) (Oral)   Ht 5' 3.75" (1.619 m) Comment: no shoes  Wt 199 lb 12 oz (90.6 kg)   SpO2 98%   BMI 34.56 kg/m   Body mass index is 34.56 kg/m.   Tobacco History  Smoking Status  . Never Smoker  Smokeless Tobacco  . Never Used     Counseling given: No   Past Medical History:  Diagnosis Date  . Anxiety   . Arthritis   . Breast cancer ([place]) 2001   s/p rady and lumpectomy  . Carcinoma in situ of breast 2001  . Cataract 1988, [place]  . Esophageal reflux   . Fatty liver   . Fibromyalgia   . Hx of adenomatous colonic polyps   . Labyrinthitis, unspecified   . Pure hypercholesterolemia   . Unspecified essential hypertension   . Urinary tract infection, site not specified    Past Surgical History:  Procedure Laterality Date  . BREAST BIOPSY  07/2000   positive right side, [facility]   . BREAST LUMPECTOMY  08/20/2000   right breast, malignant stage I, [facility]  . CATARACT EXTRACTION  1988, 1990  . FOOT SURGERY  1980   minor    Family History  Problem Relation Age of Onset  . Prostate cancer Brother   . Heart disease Brother   . Pancreatitis Brother        x 2  . Prostate cancer Brother   . Alzheimer's disease Father   . Dementia Father   . Parkinsonism Mother   . Heart disease Brother   . Heart disease Brother   . Breast cancer Paternal Aunt   . Colon cancer Neg Hx    History  Sexual Activity  . Sexual activity: Not on file    Outpatient Encounter Prescriptions as of 04/26/2017  Medication Sig  . acetaminophen (TYLENOL) 325 MG tablet Take 2 tablets (650 mg total) by  mouth 3 (three) times daily as needed.  . clobetasol ointment (TEMOVATE) 6.78 % Apply 1 application topically 2 (two) times daily. (Patient taking differently: Apply 1 application topically as needed. )  . EPINEPHrine (EPIPEN) 0.3 mg/0.3 mL DEVI Inject 0.3 mLs (0.3 mg total) into the muscle once.  Marland Kitchen LORazepam (ATIVAN) 1 MG tablet Take 1/4-1/2 tablet by mouth every 8 hours as needed  . Multiple Vitamins-Minerals (PRESERVISION AREDS 2 PO) Take by mouth.   No facility-administered encounter medications on file as of 04/26/2017.     Activities of Daily Living In your present state of health, do you have any difficulty performing the following activities: 04/26/2017  Hearing? N  Vision? N  Difficulty concentrating or making decisions? N  Walking or climbing stairs? Y  Dressing or bathing? N  Doing errands, shopping? N  Preparing Food and eating ? N  Using the Toilet? N  In the past six months, have you accidently leaked urine? Y  Do you have problems with loss of bowel control? N  Managing your Medications? N  Managing your Finances? N  Housekeeping or managing your Housekeeping? N  Some recent data might be hidden  Patient Care Team: Tonia Ghent, MD as PCP - General (Family Medicine)    Assessment:     Hearing Screening   125Hz  250Hz  500Hz  1000Hz  2000Hz  3000Hz  4000Hz  6000Hz  8000Hz   Right ear:   40 0 40  0    Left ear:   40 40 40  40    Vision Screening Comments: Last vision exam in 2017 with Dr. Gloriann Loan   Exercise Activities and Dietary recommendations Current Exercise Habits: The patient does not participate in regular exercise at present, Exercise limited by: None identified  Goals    . Increase water intake          Starting 04/26/17, I will attempt to drink at least 6-8 glasses daily.       Fall Risk Fall Risk  04/26/2017 02/24/2016 10/28/2014 09/17/2013  Falls in the past year? Yes No No No  Number falls in past yr: 1 - - -  Injury with Fall? No - - -   Depression  Screen PHQ 2/9 Scores 04/26/2017 02/24/2016 10/28/2014 09/17/2013  PHQ - 2 Score 0 0 0 0     Cognitive Function MMSE - Mini Mental State Exam 04/26/2017  Orientation to time 5  Orientation to Place 5  Registration 3  Attention/ Calculation 0  Recall 3  Language- name 2 objects 0  Language- repeat 1  Language- follow 3 step command 3  Language- read & follow direction 0  Write a sentence 0  Copy design 0  Total score 20     PLEASE NOTE: A Mini-Cog screen was completed. Maximum score is 20. A value of 0 denotes this part of [place] MMSE was not completed or the patient failed this part of the Mini-Cog screening.   Mini-Cog Screening Orientation to Time - Max 5 pts Orientation to Place - Max 5 pts Registration - Max 3 pts Recall - Max 3 pts Language Repeat - Max 1 pts Language Follow 3 Step Command - Max 3 pts     Immunization History  Administered Date(s) Administered  . Influenza Whole 10/07/2006, 10/22/2007, 09/20/2009, 09/20/2010  . Influenza,inj,Quad PF,36+ Mos 08/27/2013, 10/28/2014, 11/07/2016  . Pneumococcal Conjugate-13 02/24/2016  . Pneumococcal Polysaccharide-23 08/23/1999, 05/26/2012  . Td 07/16/2006, 03/15/2011  . Zoster 03/10/2009   Screening Tests Health Maintenance  Topic Date Due  . COLONOSCOPY  05/30/2017  . INFLUENZA VACCINE  06/26/2017  . TETANUS/TDAP  03/14/2021  . DEXA SCAN  Completed  . PNA vac Low Risk Adult  Completed      Plan:     I have personally reviewed and addressed the Medicare Annual Wellness questionnaire and have noted the following in the patient's chart:  A. Medical and social history B. Use of alcohol, tobacco or illicit drugs  C. Current medications and supplements D. Functional ability and status E.  Nutritional status F.  Physical activity G. Advance directives H. List of other physicians I.  Hospitalizations, surgeries, and ER visits in previous 12 months J.  Stem to include hearing, vision, cognitive,  depression L. Referrals and appointments - none  In addition, I have reviewed and discussed with patient certain preventive protocols, quality metrics, and best practice recommendations. A written personalized care plan for preventive services as well as general preventive health recommendations were provided to patient.  See attached scanned questionnaire for additional information.   Signed,   Lindell Noe, MHA, BS, LPN Health Coach

## 2017-04-26 NOTE — Progress Notes (Signed)
Pre visit review using our clinic review tool, if applicable. No additional management support is needed unless otherwise documented below in the visit note. 

## 2017-05-02 ENCOUNTER — Telehealth: Payer: Self-pay | Admitting: *Deleted

## 2017-05-02 ENCOUNTER — Encounter: Payer: Self-pay | Admitting: Family Medicine

## 2017-05-02 ENCOUNTER — Ambulatory Visit (INDEPENDENT_AMBULATORY_CARE_PROVIDER_SITE_OTHER): Payer: PPO | Admitting: Family Medicine

## 2017-05-02 VITALS — BP 128/80 | HR 81 | Temp 97.8°F | Ht 64.0 in | Wt 200.0 lb

## 2017-05-02 DIAGNOSIS — M21612 Bunion of left foot: Secondary | ICD-10-CM

## 2017-05-02 DIAGNOSIS — Z7189 Other specified counseling: Secondary | ICD-10-CM

## 2017-05-02 DIAGNOSIS — R21 Rash and other nonspecific skin eruption: Secondary | ICD-10-CM | POA: Diagnosis not present

## 2017-05-02 DIAGNOSIS — M21611 Bunion of right foot: Secondary | ICD-10-CM

## 2017-05-02 DIAGNOSIS — M21619 Bunion of unspecified foot: Secondary | ICD-10-CM

## 2017-05-02 DIAGNOSIS — F411 Generalized anxiety disorder: Secondary | ICD-10-CM

## 2017-05-02 DIAGNOSIS — M1990 Unspecified osteoarthritis, unspecified site: Secondary | ICD-10-CM | POA: Diagnosis not present

## 2017-05-02 DIAGNOSIS — E559 Vitamin D deficiency, unspecified: Secondary | ICD-10-CM

## 2017-05-02 MED ORDER — VITAMIN D 1000 UNITS PO TABS: 1000.0000 [IU] | ORAL_TABLET | Freq: Every day | ORAL | Status: DC

## 2017-05-02 MED ORDER — EPINEPHRINE 0.3 MG/0.3ML IJ SOAJ: 0.3000 mg | Freq: Once | INTRAMUSCULAR | 0 refills | Status: AC

## 2017-05-02 MED ORDER — LORAZEPAM 1 MG PO TABS: ORAL_TABLET | ORAL | 0 refills | Status: DC

## 2017-05-02 MED ORDER — FLUOCINONIDE 0.05 % EX CREA: 1.0000 "application " | TOPICAL_CREAM | Freq: Two times a day (BID) | CUTANEOUS | 1 refills | Status: DC | PRN

## 2017-05-02 NOTE — Progress Notes (Signed)
Hearing - failed, declined hearing aids.  D/w pt.  She has a chronic hissing noise in the ears, which she is putting up with.   Fall risk - hx of fall without injury or medical treatment, on a slick deck.  D/w pt about cautions.    Knee pain - pt reports pain and swelling in right > left knee. Pt desires a consult with Dr. Lorelei Pont. D/w pt.  She never had injection prev.  Pain walking, going up and down stairs, more pain in the AM and then later in the PM.  Pain with each step.  Fail tx with ibuprofen due to GI upset.    Feet - pt reports bunions and bilateral foot pain. Pt desires a referral to a podiatrist. D/w pt.  Referral done.   Colon cancer screening - she wanted to defer until addressing her knee and foot pain.   Mammogram d/w pt.    Living will d/w pt.  Would have her husband then her daughter designated if patient were incapacitated.    She is caring for her brothers in the meantime.  This is a noted stressor for patient, along with her husband who has chronic respiratory failure.  Rare use of BZD.  No ADE on med.  Medication helps when needed for anxiety. Not used daily.  Vitamin D deficiency noted. Discussed with patient about replacement.  Itchy rash on the medial distal shin, better with topical steroid.   PMH and SH reviewed  ROS: Per HPI unless specifically indicated in ROS section   Meds, vitals, and allergies reviewed.   GEN: nad, alert and oriented HEENT: mucous membranes moist NECK: supple w/o LA CV: rrr.  no murmur PULM: ctab, no inc wob ABD: soft, +bs EXT: no edema SKIN: no acute rash but chronic irritation noted on the medial distal shin. Diffuse IP joint changes.   Bunions noted on exam.   R knee with crepitus but able to bear weight.

## 2017-05-02 NOTE — Telephone Encounter (Signed)
Pharmacy says Fluocinonide 0.05% Cream is not on patient's formulary.  Request alternative.

## 2017-05-02 NOTE — Patient Instructions (Addendum)
[name] will call about your referral. Ask about seeing Dr. Lorelei Pont when you are checking out.   You can call for a mammogram at [facility].  [name] on vitamin D daily.  Take care.  Glad to see you.

## 2017-05-03 DIAGNOSIS — E559 Vitamin D deficiency, unspecified: Secondary | ICD-10-CM | POA: Insufficient documentation

## 2017-05-03 DIAGNOSIS — M21619 Bunion of unspecified foot: Secondary | ICD-10-CM | POA: Insufficient documentation

## 2017-05-03 NOTE — Assessment & Plan Note (Signed)
Start replacement. We can recheck episodically.

## 2017-05-03 NOTE — Assessment & Plan Note (Signed)
She was asking about options. She has noted crepitus on the right knee. She was asking if she could see Dr. Edilia Bo for consideration of injection. This is reasonable. She has failed treatment with ibuprofen due to GI upset previously.

## 2017-05-03 NOTE — Assessment & Plan Note (Signed)
Continue topical steroid. Update me as needed.

## 2017-05-03 NOTE — Assessment & Plan Note (Signed)
With significant pain. Refer to podiatry.

## 2017-05-03 NOTE — Assessment & Plan Note (Signed)
Living will d/w pt.  Would have her husband then her daughter designated if patient were incapacitated.

## 2017-05-03 NOTE — Telephone Encounter (Signed)
Have her check to see what is on her formulary in the let me know. Thanks.

## 2017-05-03 NOTE — Telephone Encounter (Addendum)
Patient advised and will contact the insurance and let us know.

## 2017-05-03 NOTE — Assessment & Plan Note (Addendum)
Continue when necessary use of benzodiazepine. No adverse effect. Rarely uses. She agrees. Significant stressors noted, see above. >25 minutes spent in face to face time with patient, >50% spent in counselling or coordination of care.

## 2017-05-08 ENCOUNTER — Encounter: Payer: Self-pay | Admitting: Family Medicine

## 2017-05-08 ENCOUNTER — Institutional Professional Consult (permissible substitution): Payer: PPO | Admitting: Family Medicine

## 2017-05-08 ENCOUNTER — Ambulatory Visit (INDEPENDENT_AMBULATORY_CARE_PROVIDER_SITE_OTHER)
Admission: RE | Admit: 2017-05-08 | Discharge: 2017-05-08 | Disposition: A | Discharge status: Home / Self Care | Payer: PPO | Source: Ambulatory Visit | Attending: Family Medicine | Admitting: Family Medicine

## 2017-05-08 ENCOUNTER — Ambulatory Visit (INDEPENDENT_AMBULATORY_CARE_PROVIDER_SITE_OTHER): Payer: PPO | Admitting: Family Medicine

## 2017-05-08 VITALS — BP 140/84 | HR 66 | Temp 97.6°F | Ht 63.75 in | Wt 205.5 lb

## 2017-05-08 DIAGNOSIS — M25561 Pain in right knee: Secondary | ICD-10-CM | POA: Diagnosis not present

## 2017-05-08 DIAGNOSIS — M1711 Unilateral primary osteoarthritis, right knee: Secondary | ICD-10-CM

## 2017-05-08 MED ORDER — METHYLPREDNISOLONE ACETATE 40 MG/ML IJ SUSP
80.0000 mg | Freq: Once | INTRAMUSCULAR | Status: AC
  Administered 2017-05-08: 80 mg via INTRA_ARTICULAR

## 2017-05-08 NOTE — Progress Notes (Signed)
Dr. Karleen Hampshire T. Jatavian Calica, MD, CAQ Sports Medicine Primary Care and Sports Medicine [address] [place], [address] Phone: [phone] Fax: [phone]  05/08/2017  Patient: [name], MRN: [id number], DOB: 24-Dec-1940, 76 y.o.  Primary Physician:  Joaquim Nam, MD   Chief Complaint  Patient presents with  . Knee Pain    Right Knee Injection   Subjective:   [name] is a 76 y.o. very pleasant female patient who presents with the following:  Pleasant patient with a history of multi-joint osteoarthritis presents with right-sided knee pain ongoing for multiple months. She delayed coming into the office secondary to concern for exposure during flu season. Right now she does have some motion restriction on the right and she does have some fluid on her right knee. She has not had any trauma or accident. No prior operations.  60 cc  Past Medical History, Surgical History, Social History, Family History, Problem List, Medications, and Allergies have been reviewed and updated if relevant.  Patient Active Problem List   Diagnosis Date Noted  . Vitamin D deficiency 05/03/2017  . Bunion 05/03/2017  . Rash and nonspecific skin eruption 06/05/2016  . Skin cyst 06/05/2016  . History of nonmelanoma skin cancer 03/11/2015  . Dysuria 12/07/2014  . Anxiety state 10/31/2014  . Advance care planning 10/31/2014  . Iliotibial band syndrome 02/11/2014  . Hyperglycemia 09/17/2013  . OA (osteoarthritis) 12/04/2012  . 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 History of bone density study 06/12/2012  . Medicare annual wellness visit, subsequent 05/26/2012  . GERD 04/18/2009  . ABDOMINAL PAIN-EPIGASTRIC 04/18/2009  . Essential hypertension 03/26/2007  . URINARY TRACT INFECTION, RECURRENT 03/26/2007  . FIBROMYALGIA 03/26/2007    Past Medical History:  Diagnosis Date  . Anxiety   . Arthritis   . Breast cancer (HCC) 2001   s/p rady and lumpectomy  . Carcinoma in  situ of breast 2001  . Cataract 1988, 1990   [facility]  . Esophageal reflux   . Fatty liver   . Fibromyalgia   . Hx of adenomatous colonic polyps   . Labyrinthitis, unspecified   . Pure hypercholesterolemia   . Unspecified essential hypertension   . Urinary tract infection, site not specified     Past Surgical History:  Procedure Laterality Date  . BREAST BIOPSY  07/2000   positive right side, [facility]   . BREAST LUMPECTOMY  08/20/2000   right breast, malignant stage I, [facility]  . CATARACT EXTRACTION  1988, 1990  . FOOT SURGERY  1980   minor     Social History   Social History  . Marital status: Married    Spouse name: N/A  . Number of children: N/A  . Years of education: N/A   Occupational History  . Housewife    Social History Main Topics  . Smoking status: Never Smoker  . Smokeless tobacco: Never Used  . Alcohol use No  . Drug use: No  . Sexual activity: Not on file   Other Topics Concern  . Not on file   Social History Narrative   Patient does not get regular exercise   Daily caffeine-one per day   Married 1958   Retired from [employer]    Family History  Problem Relation Age of Onset  . Prostate cancer Brother   . Heart disease Brother   . Pancreatitis Brother        x 2  . Prostate cancer Brother   . Alzheimer's disease  Father   . Dementia Father   . Parkinsonism Mother   . Heart disease Brother   . Heart disease Brother   . Breast cancer Paternal Aunt   . Colon cancer Neg Hx     Allergies  Allergen Reactions  . Keflex [Cephalexin]     Possible cause of rash 05/2016  . Lipitor [Atorvastatin]     intolerant  . Lisinopril Other (See Comments)    Intolerant, abd pain  . Metoprolol Succinate     REACTION: Exhaustion, increased dizziness  . Nitrofurantoin     REACTION: Rash  . Sulfadiazine     REACTION: Rash  . Yellow Jacket Venom [Bee Venom]     Short of breath    Medication list reviewed and updated in full in [facility].  GEN:  No fevers, chills. Nontoxic. Primarily MSK c/o today. MSK: Detailed in the HPI GI: tolerating PO intake without difficulty Neuro: No numbness, parasthesias, or tingling associated. Otherwise the pertinent positives of the ROS are noted above.   Objective:   BP 140/84   Pulse 66   Temp 97.6 F (36.4 C) (Oral)   Ht 5' 3.75" (1.619 m)   Wt 205 lb 8 oz (93.2 kg)   BMI 35.55 kg/m    GEN: WDWN, NAD, Non-toxic, Alert & Oriented x 3 HEENT: Atraumatic, Normocephalic.  Ears and Nose: No external deformity. EXTR: No clubbing/cyanosis/edema NEURO: Normal gait.  PSYCH: Normally interactive. Conversant. Not depressed or anxious appearing.  Calm demeanor.    Right knee lacks 4 of extension. Flexion to 95. There is a mild to moderate effusion. She has joint pain on the medial and lateral joint lines. Stable anterior cruciate ligament, MCL, PCL, and LCL. Minimal tenderness at the pes bursa.   Given effusion, meniscal testing is equivocal.  Radiology: No results found.   Assessment and Plan:   Osteoarthrosis, localized, primary, knee, right - Plan: DG Knee 4 Views W/Patella Right, methylPREDNISolone acetate (DEPO-MEDROL) injection 80 mg  Tricompartmental arthritis, advanced lateral osteoarthritis. Effusion.  Reviewed all with her and reviewed films face-to-face. Continue with Tylenol, recommend weight loss, physical activity including in the pool of possible.  The patient additionally had questions regarding chronic back pain, fibromyalgia. This is really beyond the scope of what I could cover in the entirety of this visit, and recommended that she do the above and consider lidocaine cream or lidocaine patches.  Knee Aspiration and Injection, R Patient verbally consented; risks, benefits, and alternatives explained including possible infection. Patient prepped with Chloraprep. Ethyl chloride for anesthesia. 10 cc of 1% Lidocaine used in wheal then injected Subcutaneous fashion with 22  gauge needle on lateral approach. Under sterilne conditions, 18 gauge needle used via lateral approach to aspirate 60 cc of serosanguinous fluid. Then 8 cc of Marcaine 0.25% and 2 mL of Depo-Medrol 40 mg injected. Tolerated well, decreased pain, no complications.   Follow-up: prn  Future Appointments Date Time Provider Department Center  05/05/2018 10:30 AM Robert Bellow, LPN [facility]  05/12/2018 9:45 AM Joaquim Nam, MD [facility]    Meds ordered this encounter  Medications  . methylPREDNISolone acetate (DEPO-MEDROL) injection 80 mg   There are no discontinued medications. Orders Placed This Encounter  Procedures  . DG Knee 4 Views W/Patella Right    Signed,  Karleen Hampshire T. Mosella Kasa, MD   Allergies as of 05/08/2017      Reactions   Keflex [cephalexin]    Possible cause of rash 05/2016   Lipitor [  atorvastatin]    intolerant   Lisinopril Other (See Comments)   Intolerant, abd pain   Metoprolol Succinate    REACTION: Exhaustion, increased dizziness   Nitrofurantoin    REACTION: Rash   Sulfadiazine    REACTION: Rash   Yellow Jacket Venom [bee Venom]    Short of breath      Medication List       Accurate as of 05/08/17  9:50 AM. Always use your most recent med list.          acetaminophen 325 MG tablet Commonly known as:  TYLENOL Take 2 tablets (650 mg total) by mouth 3 (three) times daily as needed.   cholecalciferol 1000 units tablet Commonly known as:  VITAMIN D Take 1 tablet (1,000 Units total) by mouth daily.   fluocinonide cream 0.05 % Commonly known as:  LIDEX Apply 1 application topically 2 (two) times daily as needed.   LORazepam 1 MG tablet Commonly known as:  ATIVAN Take 1/4-1/2 tablet by mouth every 8 hours as needed   PRESERVISION AREDS 2 PO Take by mouth.

## 2017-05-30 ENCOUNTER — Ambulatory Visit (INDEPENDENT_AMBULATORY_CARE_PROVIDER_SITE_OTHER): Payer: PPO | Admitting: Family Medicine

## 2017-05-30 ENCOUNTER — Encounter: Payer: Self-pay | Admitting: Family Medicine

## 2017-05-30 VITALS — BP 136/70 | HR 77 | Temp 97.5°F | Ht 63.75 in | Wt 205.0 lb

## 2017-05-30 DIAGNOSIS — R300 Dysuria: Secondary | ICD-10-CM | POA: Diagnosis not present

## 2017-05-30 DIAGNOSIS — R600 Localized edema: Secondary | ICD-10-CM | POA: Diagnosis not present

## 2017-05-30 DIAGNOSIS — R1030 Lower abdominal pain, unspecified: Secondary | ICD-10-CM | POA: Diagnosis not present

## 2017-05-30 LAB — POC URINALSYSI DIPSTICK (AUTOMATED)
Bilirubin, UA: NEGATIVE
Blood, UA: NEGATIVE
Glucose, UA: NEGATIVE
Ketones, UA: NEGATIVE
Leukocytes, UA: NEGATIVE
Nitrite, UA: NEGATIVE
Protein, UA: NEGATIVE
Spec Grav, UA: >=1.03 — AB (ref 1.010–1.025)
Urobilinogen, UA: 0.2 E.U./dL
pH, UA: 6 (ref 5.0–8.0)

## 2017-05-30 MED ORDER — CIPROFLOXACIN HCL 250 MG PO TABS
250.0000 mg | ORAL_TABLET | Freq: Two times a day (BID) | ORAL | 0 refills | Status: DC
Start: 2017-05-30 — End: 2017-06-17

## 2017-05-30 NOTE — Patient Instructions (Signed)
Compression socks or compression stockings for swelling

## 2017-05-30 NOTE — Progress Notes (Signed)
Dr. Karleen Hampshire T. Zuzu Befort, MD, CAQ Sports Medicine Primary Care and Sports Medicine [address] [place], [address] Phone: [phone] Fax: [phone]  05/30/2017  Patient: [name], MRN: [id number], DOB: May 30, 1941, 76 y.o.  Primary Physician:  Joaquim Nam, MD   Chief Complaint  Patient presents with  . Abdominal Pain    Lower  . Edema    Feet/Ankles   Subjective:   [name] is a 76 y.o. very pleasant female patient who presents with the following:  Dysuria, urgency, and some hypogastric abdominal pain.  Denies prior antibiotics and has not been taking any Azo.  No systemic fevers.  She also complains of bilateral lower extremity edema.  She did deaths in the family within the last week and has been on her feet a lot and taking care of this and other family stressors.  Past Medical History, Surgical History, Social History, Family History, Problem List, Medications, and Allergies have been reviewed and updated if relevant.  Patient Active Problem List   Diagnosis Date Noted  . Vitamin D deficiency 05/03/2017  . Bunion 05/03/2017  . Rash and nonspecific skin eruption 06/05/2016  . Skin cyst 06/05/2016  . History of nonmelanoma skin cancer 03/11/2015  . Dysuria 12/07/2014  . Anxiety state 10/31/2014  . Advance care planning 10/31/2014  . Iliotibial band syndrome 02/11/2014  . Hyperglycemia 09/17/2013  . OA (osteoarthritis) 12/04/2012  . 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 History of bone density study 06/12/2012  . Medicare annual wellness visit, subsequent 05/26/2012  . GERD 04/18/2009  . ABDOMINAL PAIN-EPIGASTRIC 04/18/2009  . Essential hypertension 03/26/2007  . URINARY TRACT INFECTION, RECURRENT 03/26/2007  . FIBROMYALGIA 03/26/2007    Past Medical History:  Diagnosis Date  . Anxiety   . Arthritis   . Breast cancer (HCC) 2001   s/p rady and lumpectomy  . Carcinoma in situ of breast 2001  . Cataract 1988, 1990   [facility]  . Esophageal reflux   . Fatty liver   . Fibromyalgia   . Hx of adenomatous colonic polyps   . Labyrinthitis, unspecified   . Pure hypercholesterolemia   . Unspecified essential hypertension   . Urinary tract infection, site not specified     Past Surgical History:  Procedure Laterality Date  . BREAST BIOPSY  07/2000   positive right side, [facility]   . BREAST LUMPECTOMY  08/20/2000   right breast, malignant stage I, [facility]  . CATARACT EXTRACTION  1988, 1990  . FOOT SURGERY  1980   minor     Social History   Social History  . Marital status: Married    Spouse name: N/A  . Number of children: N/A  . Years of education: N/A   Occupational History  . Housewife    Social History Main Topics  . Smoking status: Never Smoker  . Smokeless tobacco: Never Used  . Alcohol use No  . Drug use: No  . Sexual activity: Not on file   Other Topics Concern  . Not on file   Social History Narrative   Patient does not get regular exercise   Daily caffeine-one per day   Married 1958   Retired from [employer]    Family History  Problem Relation Age of Onset  . Prostate cancer Brother   . Heart disease Brother   . Pancreatitis Brother        x 2  . Prostate cancer Brother   . Alzheimer's disease Father   .  Dementia Father   . Parkinsonism Mother   . Heart disease Brother   . Heart disease Brother   . Breast cancer Paternal Aunt   . Colon cancer Neg Hx     Allergies  Allergen Reactions  . Keflex [Cephalexin]     Possible cause of rash 05/2016  . Lipitor [Atorvastatin]     intolerant  . Lisinopril Other (See Comments)    Intolerant, abd pain  . Metoprolol Succinate     REACTION: Exhaustion, increased dizziness  . Nitrofurantoin     REACTION: Rash  . Sulfadiazine     REACTION: Rash  . Yellow Jacket Venom [Bee Venom]     Short of breath    Medication list reviewed and updated in full in [place] [facility].  ROS: GEN: Acute illness details above GI:  Tolerating PO intake GU: maintaining adequate hydration and urination Pulm: No SOB Interactive and getting along well at home.  Otherwise, ROS is as per the HPI.  Objective:   BP 136/70   Pulse 77   Temp (!) 97.5 F (36.4 C) (Oral)   Ht 5' 3.75" (1.619 m)   Wt 205 lb (93 kg)   BMI 35.46 kg/m    GEN: WDWN, A&Ox4,NAD. Non-toxic HEENT: Atraumatc, normocephalic. CV: RRR, No M/G/R PULM: CTA B, No wheezes, crackles, or rhonchi ABD: S, NT, ND, +BS, no rebound. No CVAT. mild suprapubic tenderness. EXT: No c/c/tr le edema   Laboratory and Imaging Data: Results for orders placed or performed in visit on 05/30/17  POCT Urinalysis Dipstick (Automated)  Result Value Ref Range   Color, UA yellow    Clarity, UA clear    Glucose, UA negative    Bilirubin, UA negative    Ketones, UA negative    Spec Grav, UA >=1.030 (A) 1.010 - 1.025   Blood, UA negative    pH, UA 6.0 5.0 - 8.0   Protein, UA negative    Urobilinogen, UA 0.2 0.2 or 1.0 E.U./dL   Nitrite, UA negative    Leukocytes, UA Negative Negative     Assessment and Plan:   Dysuria  Lower abdominal pain - Plan: POCT Urinalysis Dipstick (Automated), Urine Culture  Lower extremity edema  Given her dysuria and history of UTI, I think it is not unreasonable to start her on some antibiotics now.  UA is reassuring.  I tried to reassure her.  The patient asked me for diuretics, and I declined.  Recommended compression stockings or sports compression socks.  Put up feet if able.  Follow-up: No Follow-up on file.  Future Appointments Date Time Provider Department Center  05/05/2018 10:30 AM Robert Bellow, LPN [facility]  05/12/2018 9:45 AM Joaquim Nam, MD [facility]    Meds ordered this encounter  Medications  . ciprofloxacin (CIPRO) 250 MG tablet    Sig: Take 1 tablet (250 mg total) by mouth 2 (two) times daily.    Dispense:  14 tablet    Refill:  0   There are no discontinued  medications. Orders Placed This Encounter  Procedures  . Urine Culture  . POCT Urinalysis Dipstick (Automated)    Signed,  Lileigh Fahringer T. Amandalee Lacap, MD   Allergies as of 05/30/2017      Reactions   Keflex [cephalexin]    Possible cause of rash 05/2016   Lipitor [atorvastatin]    intolerant   Lisinopril Other (See Comments)   Intolerant, abd pain   Metoprolol Succinate    REACTION: Exhaustion, increased dizziness  Nitrofurantoin    REACTION: Rash   Sulfadiazine    REACTION: Rash   Yellow Jacket Venom [bee Venom]    Short of breath      Medication List       Accurate as of 05/30/17 11:59 PM. Always use your most recent med list.          acetaminophen 325 MG tablet Commonly known as:  TYLENOL Take 2 tablets (650 mg total) by mouth 3 (three) times daily as needed.   cholecalciferol 1000 units tablet Commonly known as:  VITAMIN D Take 1 tablet (1,000 Units total) by mouth daily.   ciprofloxacin 250 MG tablet Commonly known as:  CIPRO Take 1 tablet (250 mg total) by mouth 2 (two) times daily.   fluocinonide cream 0.05 % Commonly known as:  LIDEX Apply 1 application topically 2 (two) times daily as needed.   LORazepam 1 MG tablet Commonly known as:  ATIVAN Take 1/4-1/2 tablet by mouth every 8 hours as needed   PRESERVISION AREDS 2 PO Take by mouth.

## 2017-05-31 LAB — URINE CULTURE: Organism ID, Bacteria: NO GROWTH

## 2017-06-17 ENCOUNTER — Encounter: Payer: Self-pay | Admitting: Primary Care

## 2017-06-17 ENCOUNTER — Ambulatory Visit (INDEPENDENT_AMBULATORY_CARE_PROVIDER_SITE_OTHER): Payer: PPO | Admitting: Primary Care

## 2017-06-17 VITALS — BP 132/82 | HR 70 | Temp 97.7°F | Ht 63.75 in | Wt 205.8 lb

## 2017-06-17 DIAGNOSIS — R350 Frequency of micturition: Secondary | ICD-10-CM

## 2017-06-17 DIAGNOSIS — R300 Dysuria: Secondary | ICD-10-CM

## 2017-06-17 LAB — POC URINALSYSI DIPSTICK (AUTOMATED)
Bilirubin, UA: NEGATIVE
Blood, UA: NEGATIVE
Clarity, UA: NEGATIVE
Color, UA: NEGATIVE
Glucose, UA: NEGATIVE
Ketones, UA: NEGATIVE
Leukocytes, UA: NEGATIVE
Nitrite, UA: NEGATIVE
Protein, UA: NEGATIVE
Spec Grav, UA: 1.015 (ref 1.010–1.025)
Urobilinogen, UA: 0.2 E.U./dL
pH, UA: 6 (ref 5.0–8.0)

## 2017-06-17 MED ORDER — PHENAZOPYRIDINE HCL 200 MG PO TABS: 200.0000 mg | ORAL_TABLET | Freq: Three times a day (TID) | ORAL | 0 refills | Status: DC | PRN

## 2017-06-17 NOTE — Patient Instructions (Addendum)
Complete lab work prior to leaving today.  You may take they Pyridium every 8 hours as needed for burning and frequency.  Work to reduce stress as this could cause bladder spasms which could mimic a urinary tract infection.  Ensure you are staying hydrated with water.  We will be in touch with you once we receive your labs, vaginal specimen, and urine culture results.  It was a pleasure meeting you!

## 2017-06-17 NOTE — Progress Notes (Addendum)
Subjective:    Patient ID: [name], female    DOB: 20-Aug-1941, 76 y.o.   MRN: [id number]  HPI  [name] is a 76 year old female with a history of recurrent UTI, essential hypertension who presents today with a chief complaint of urinary frequency. She also reports dysuria. Her symptoms began about 4 weeks ago. She denies hematuria, abdominal pain, vaginal discharge, vaginal itching, fevers. She's been taking AZO with temporary improvement. She took 2-3 of her remaining Cipro tablets last week. Last treatment for UTI was in early July 2018, negative urine culture at that time.   Review of Systems  Constitutional: Negative for fever.  Gastrointestinal: Negative for abdominal pain, nausea and vomiting.  Genitourinary: Positive for dysuria and frequency. Negative for hematuria, pelvic pain and vaginal discharge.       Past Medical History:  Diagnosis Date  . Anxiety   . Arthritis   . Breast cancer ([place]) 2001   s/p rady and lumpectomy  . Carcinoma in situ of breast 2001  . Cataract 1988, [place]  . Esophageal reflux   . Fatty liver   . Fibromyalgia   . Hx of adenomatous colonic polyps   . Labyrinthitis, unspecified   . Pure hypercholesterolemia   . Unspecified essential hypertension   . Urinary tract infection, site not specified      Social History   Social History  . Marital status: Married    Spouse name: N/A  . Number of children: N/A  . Years of education: N/A   Occupational History  . Housewife    Social History Main Topics  . Smoking status: Never Smoker  . Smokeless tobacco: Never Used  . Alcohol use No  . Drug use: No  . Sexual activity: Not on file   Other Topics Concern  . Not on file   Social History Narrative   Patient does not get regular exercise   Daily caffeine-one per day   Married 1958   Retired from [employer]    Past Surgical History:  Procedure Laterality Date  . BREAST BIOPSY  07/2000   positive right side, [facility]    . BREAST LUMPECTOMY  08/20/2000   right breast, malignant stage I, [facility]  . CATARACT EXTRACTION  1988, 1990  . FOOT SURGERY  1980   minor     Family History  Problem Relation Age of Onset  . Prostate cancer Brother   . Heart disease Brother   . Pancreatitis Brother        x 2  . Prostate cancer Brother   . Alzheimer's disease Father   . Dementia Father   . Parkinsonism Mother   . Heart disease Brother   . Heart disease Brother   . Breast cancer Paternal Aunt   . Colon cancer Neg Hx     Allergies  Allergen Reactions  . Keflex [Cephalexin]     Possible cause of rash 05/2016  . Lipitor [Atorvastatin]     intolerant  . Lisinopril Other (See Comments)    Intolerant, abd pain  . Metoprolol Succinate     REACTION: Exhaustion, increased dizziness  . Nitrofurantoin     REACTION: Rash  . Sulfadiazine     REACTION: Rash  . Yellow Jacket Venom [Bee Venom]     Short of breath    Current Outpatient Prescriptions on File Prior to Visit  Medication Sig Dispense Refill  . acetaminophen (TYLENOL) 325 MG tablet Take 2 tablets (650 mg total)  by mouth 3 (three) times daily as needed.    . cholecalciferol (VITAMIN D) 1000 units tablet Take 1 tablet (1,000 Units total) by mouth daily.    . fluocinonide cream (LIDEX) 5.99 % Apply 1 application topically 2 (two) times daily as needed. 60 g 1  . LORazepam (ATIVAN) 1 MG tablet Take 1/4-1/2 tablet by mouth every 8 hours as needed 30 tablet 0  . Multiple Vitamins-Minerals (PRESERVISION AREDS 2 PO) Take by mouth.     No current facility-administered medications on file prior to visit.     BP 132/82   Pulse 70   Temp 97.7 F (36.5 C) (Oral)   Ht 5' 3.75" (1.619 m)   Wt 205 lb 12.8 oz (93.4 kg)   SpO2 97%   BMI 35.60 kg/m    Objective:   Physical Exam  Constitutional: She appears well-nourished. She does not appear ill.  Neck: Neck supple.  Cardiovascular: Normal rate and regular rhythm.   Pulmonary/Chest: Effort normal and breath  sounds normal.  Abdominal: Soft. There is no CVA tenderness.  Genitourinary: There is no lesion on the right labia. There is no lesion on the left labia. Cervix exhibits no motion tenderness. Right adnexum displays no mass. Left adnexum displays no mass. No erythema in the vagina. No vaginal discharge found.  Genitourinary Comments: Mild whitish discharge from cervix. No foul smell.   Skin: Skin is warm and dry.          Assessment & Plan:  Urinary Frequency:  Also with dysuria and intermittent pelvic discomfort. History of recurrent UTI, last treated with Cipro course in early July. UA today negative. Culture sent given history. Check CBC, BMP today. A1C negative in early June 2018. Pelvic exam today. Wet prep sent off. Rx for Pyridium sent to pharmacy. Patient upset that she didn't receive antibiotics today, discussed rationale for this and that we would be getting a CBC and wet prep for further evaluation. Symptoms could be interstitial cystitis from stress, discussed this as well. Wouldn't recommend antibiotics until labs return.  Sheral Flow, NP

## 2017-06-17 NOTE — Addendum Note (Signed)
Addended by: Jacqualin Combes on: 06/17/2017 04:19 PM   Modules accepted: Orders

## 2017-06-17 NOTE — Addendum Note (Signed)
Addended by: Jacqualin Combes on: 06/17/2017 04:37 PM   Modules accepted: Orders

## 2017-06-18 LAB — BASIC METABOLIC PANEL
BUN: 13 mg/dL (ref 6–23)
CO2: 29 mEq/L (ref 19–32)
Calcium: 9.4 mg/dL (ref 8.4–10.5)
Chloride: 105 mEq/L (ref 96–112)
Creatinine, Ser: 0.9 mg/dL (ref 0.40–1.20)
GFR: 64.41 mL/min (ref >60.00)
Glucose, Bld: 79 mg/dL (ref 70–99)
Potassium: 4.1 mEq/L (ref 3.5–5.1)
Sodium: 141 mEq/L (ref 135–145)

## 2017-06-18 LAB — CBC WITH DIFFERENTIAL/PLATELET
Basophils Absolute: 0.1 10*3/uL (ref 0.0–0.1)
Basophils Relative: 1.4 % (ref 0.0–3.0)
Eosinophils Absolute: 0.2 10*3/uL (ref 0.0–0.7)
Eosinophils Relative: 2.9 % (ref 0.0–5.0)
HCT: 40.4 % (ref 36.0–46.0)
Hemoglobin: 13.4 g/dL (ref 12.0–15.0)
Lymphocytes Relative: 24.4 % (ref 12.0–46.0)
Lymphs Abs: 1.6 10*3/uL (ref 0.7–4.0)
MCHC: 33.1 g/dL (ref 30.0–36.0)
MCV: 89.5 fl (ref 78.0–100.0)
Monocytes Absolute: 0.6 10*3/uL (ref 0.1–1.0)
Monocytes Relative: 9.1 % (ref 3.0–12.0)
Neutro Abs: 4.1 10*3/uL (ref 1.4–7.7)
Neutrophils Relative %: 62.2 % (ref 43.0–77.0)
Platelets: 251 10*3/uL (ref 150.0–400.0)
RBC: 4.51 Mil/uL (ref 3.87–5.11)
RDW: 13.2 % (ref 11.5–15.5)
WBC: 6.6 10*3/uL (ref 4.0–10.5)

## 2017-06-18 LAB — WET PREP BY MOLECULAR PROBE
Candida species: NOT DETECTED
Gardnerella vaginalis: NOT DETECTED
Trichomonas vaginosis: NOT DETECTED

## 2017-06-19 LAB — URINE CULTURE

## 2017-06-20 ENCOUNTER — Telehealth: Payer: Self-pay | Admitting: Primary Care

## 2017-06-20 ENCOUNTER — Other Ambulatory Visit: Payer: Self-pay | Admitting: Primary Care

## 2017-06-20 DIAGNOSIS — N3000 Acute cystitis without hematuria: Secondary | ICD-10-CM

## 2017-06-20 MED ORDER — CEPHALEXIN 500 MG PO CAPS: 500.0000 mg | ORAL_CAPSULE | Freq: Two times a day (BID) | ORAL | 0 refills | Status: DC

## 2017-06-20 NOTE — Telephone Encounter (Signed)
Spoke with patient this morning. She denies allergy to Cephalexin, doesn't every recall having a rash. Will trial cephalexin course for treatment of UTI. Resistance to numerous antibiotics. Also recommended Urology evaluation given recurrent UTI's with bacterial resistance. She will discuss this with PCP.

## 2017-06-21 ENCOUNTER — Ambulatory Visit: Payer: PPO | Admitting: Primary Care

## 2017-06-25 ENCOUNTER — Telehealth: Payer: Self-pay | Admitting: Family Medicine

## 2017-06-25 NOTE — Telephone Encounter (Signed)
Discussion with patient yesterday at her husband's office visit. She still has dysuria. She is on appropriate antibiotics given her recent culture results. She has a multiresistant organism on her urine culture. Explained to her that her previous behavior with hoarding and misuse of antibiotics increases the likelihood of antibiotic resistance on culture. Explained to her that such behavior also makes it harder to get useful urine culture results. She is not done with antibiotics, she is not worse in the meantime, okay to continue as is and update [place] as needed. Encouraged her to use antibiotics appropriately.

## 2017-06-26 ENCOUNTER — Telehealth: Payer: Self-pay | Admitting: Family Medicine

## 2017-06-26 NOTE — Telephone Encounter (Signed)
Pt states that she is returning lugenes call. Please call back at [phone]

## 2017-06-26 NOTE — Telephone Encounter (Signed)
Called patient back. Message was left regarding her husband. See phone note on his chart.

## 2017-07-22 DIAGNOSIS — W57XXXA Bitten or stung by nonvenomous insect and other nonvenomous arthropods, initial encounter: Secondary | ICD-10-CM | POA: Diagnosis not present

## 2017-07-22 DIAGNOSIS — J209 Acute bronchitis, unspecified: Secondary | ICD-10-CM | POA: Diagnosis not present

## 2017-07-22 DIAGNOSIS — S50862A Insect bite (nonvenomous) of left forearm, initial encounter: Secondary | ICD-10-CM | POA: Diagnosis not present

## 2017-09-11 ENCOUNTER — Ambulatory Visit: Payer: PPO | Admitting: Family Medicine

## 2017-09-13 ENCOUNTER — Ambulatory Visit (INDEPENDENT_AMBULATORY_CARE_PROVIDER_SITE_OTHER): Payer: PPO | Admitting: Family Medicine

## 2017-09-13 ENCOUNTER — Encounter: Payer: Self-pay | Admitting: Family Medicine

## 2017-09-13 ENCOUNTER — Ambulatory Visit (INDEPENDENT_AMBULATORY_CARE_PROVIDER_SITE_OTHER)
Admission: RE | Admit: 2017-09-13 | Discharge: 2017-09-13 | Disposition: A | Discharge status: Home / Self Care | Payer: PPO | Source: Ambulatory Visit | Attending: Family Medicine | Admitting: Family Medicine

## 2017-09-13 VITALS — BP 128/72 | HR 71 | Temp 97.5°F | Wt 200.0 lb

## 2017-09-13 DIAGNOSIS — M545 Low back pain, unspecified: Secondary | ICD-10-CM

## 2017-09-13 DIAGNOSIS — M25552 Pain in left hip: Secondary | ICD-10-CM

## 2017-09-13 DIAGNOSIS — R350 Frequency of micturition: Secondary | ICD-10-CM

## 2017-09-13 DIAGNOSIS — M1711 Unilateral primary osteoarthritis, right knee: Secondary | ICD-10-CM

## 2017-09-13 LAB — POC URINALSYSI DIPSTICK (AUTOMATED)
Bilirubin, UA: NEGATIVE
Blood, UA: NEGATIVE
Glucose, UA: NEGATIVE
Ketones, UA: NEGATIVE
Leukocytes, UA: NEGATIVE
Nitrite, UA: NEGATIVE
Protein, UA: NEGATIVE
Spec Grav, UA: 1.015 (ref 1.010–1.025)
Urobilinogen, UA: 0.2 E.U./dL
pH, UA: 7 (ref 5.0–8.0)

## 2017-09-13 MED ORDER — PREDNISONE 20 MG PO TABS: ORAL_TABLET | ORAL | 0 refills | Status: DC

## 2017-09-13 MED ORDER — MELOXICAM 15 MG PO TABS: 15.0000 mg | ORAL_TABLET | Freq: Every day | ORAL | 2 refills | Status: DC

## 2017-09-13 NOTE — Progress Notes (Signed)
Dr. Karleen Hampshire T. Albie Bazin, MD, CAQ Sports Medicine Primary [facility] and Sports Medicine [address] [place], [address] Phone: [phone] Fax: [phone]  09/13/2017  Patient: [name], MRN: [id number], DOB: 02-20-1941, 76 y.o.  Primary Physician:  Joaquim Nam, MD   Chief Complaint  Patient presents with  . Hip Pain    Left hip pain  . Back Pain    Low Back Pain  . Knee Pain    Right Knee Pain  . Urinary Frequency    Some frequency and urgency for "awhile". H/O cysytisi. Some dysuria.   Subjective:   ISAURA [name] is a 76 y.o. very pleasant female patient who presents with the following:  Patient presents with diffuse musculoskeletal complaints throughout most of her body. She carries a diagnosis of fibromyalgia in her chart.  Previously I have seen her before for her knee arthritis, and aspirated her knee injected her knee with some success over at least 3 or 4 months.  Today she has complains of continued right knee pain, left-sided "hip" pain, as well as left-sided low back pain and pain primarily in the buttocks region.  She also has pain and some increased frequency with urination.  L lower back and lower buttocks on the left.   Past Medical History, Surgical History, Social History, Family History, Problem List, Medications, and Allergies have been reviewed and updated if relevant.  Patient Active Problem List   Diagnosis Date Noted  . Vitamin D deficiency 05/03/2017  . Bunion 05/03/2017  . Rash and nonspecific skin eruption 06/05/2016  . Skin cyst 06/05/2016  . History of nonmelanoma skin cancer 03/11/2015  . Dysuria 12/07/2014  . Anxiety state 10/31/2014  . Advance care planning 10/31/2014  . Iliotibial band syndrome 02/11/2014  . Hyperglycemia 09/17/2013  . OA (osteoarthritis) 12/04/2012  . 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 History of bone density study 06/12/2012  . Medicare annual wellness visit, subsequent 05/26/2012    . GERD 04/18/2009  . ABDOMINAL PAIN-EPIGASTRIC 04/18/2009  . Essential hypertension 03/26/2007  . URINARY TRACT INFECTION, RECURRENT 03/26/2007  . FIBROMYALGIA 03/26/2007    Past Medical History:  Diagnosis Date  . Anxiety   . Arthritis   . Breast cancer (HCC) 2001   s/p rady and lumpectomy  . Carcinoma in situ of breast 2001  . Cataract 1988, 1990   [facility]  . Esophageal reflux   . Fatty liver   . Fibromyalgia   . Hx of adenomatous colonic polyps   . Labyrinthitis, unspecified   . Pure hypercholesterolemia   . Unspecified essential hypertension   . Urinary tract infection, site not specified     Past Surgical History:  Procedure Laterality Date  . BREAST BIOPSY  07/2000   positive right side, [facility]   . BREAST LUMPECTOMY  08/20/2000   right breast, malignant stage I, [facility]  . CATARACT EXTRACTION  1988, 1990  . FOOT SURGERY  1980   minor     Social History   Social History  . Marital status: Married    Spouse name: N/A  . Number of children: N/A  . Years of education: N/A   Occupational History  . Housewife    Social History Main Topics  . Smoking status: Never Smoker  . Smokeless tobacco: Never Used  . Alcohol use No  . Drug use: No  . Sexual activity: Not on file   Other Topics Concern  . Not on file   Social History Narrative  Patient does not get regular exercise   Daily caffeine-one per day   Married 1958   Retired from [employer]    Family History  Problem Relation Age of Onset  . Prostate cancer Brother   . Heart disease Brother   . Pancreatitis Brother        x 2  . Prostate cancer Brother   . Alzheimer's disease Father   . Dementia Father   . Parkinsonism Mother   . Heart disease Brother   . Heart disease Brother   . Breast cancer Paternal Aunt   . Colon cancer Neg Hx     Allergies  Allergen Reactions  . Lipitor [Atorvastatin]     intolerant  . Lisinopril Other (See Comments)    Intolerant, abd pain  . Metoprolol  Succinate     REACTION: Exhaustion, increased dizziness  . Nitrofurantoin     REACTION: Rash  . Sulfadiazine     REACTION: Rash  . Yellow Jacket Venom [Bee Venom]     Short of breath    Medication list reviewed and updated in full in [facility].  GEN: No fevers, chills. Nontoxic. Primarily MSK c/o today. MSK: Detailed in the HPI GI: tolerating PO intake without difficulty Neuro: No numbness, parasthesias, or tingling associated. Otherwise the pertinent positives of the ROS are noted above.   Objective:   BP 128/72 (BP Location: Left Arm, Patient Position: Sitting, Cuff Size: Large)   Pulse 71   Temp (!) 97.5 F (36.4 C) (Oral)   Wt 200 lb (90.7 kg)   SpO2 99%   BMI 34.60 kg/m    GEN: WDWN, NAD, Non-toxic, Alert & Oriented x 3 HEENT: Atraumatic, Normocephalic.  Ears and Nose: No external deformity. EXTR: No clubbing/cyanosis/edema NEURO: Normal gait.  PSYCH: Normally interactive. Conversant. Not depressed or anxious appearing.  Calm demeanor.    Right knee lacks 2 of extension and flexion to 110.  Notable pain on the medial lateral joint lines.  Pain with manipulation of patella.  Stable MCL, LCL, ACL, and PCL.  Modest effusion.  Lumbar spine is tender from L3-S1 bilaterally.  She also has tenderness in the posterior pelvis region, more on the left.  Notable tenderness in the piriformis region as well.  Straight leg raise is negative.  Neurovascularly intact in lower extremities.  Radiology: Dg Lumbar Spine Complete  Result Date: 09/13/2017 CLINICAL DATA:  76 year old female with acute left-sided back pain. No reported trauma. Initial encounter. EXAM: LUMBAR SPINE - COMPLETE 4+ VIEW COMPARISON:  07/07/2012 CT. FINDINGS: Mild L3-4, mild to moderate L4-5 and moderate to marked L5-S1 disc space narrowing. Facet degenerative changes L2-3 through L5-S1. No acute compression fracture noted. IMPRESSION: Mild L3-4, mild to moderate L4-5 and moderate to marked L5-S1 disc  space narrowing. Facet degenerative changes L2-3 through L5-S1. Electronically Signed   By: Lacy Duverney M.D.   On: 09/13/2017 13:18   Dg Hip Unilat With Pelvis 2-3 Views Left  Result Date: 09/13/2017 CLINICAL DATA:  Left hip pain.  No reported injury . EXAM: DG HIP (WITH OR WITHOUT PELVIS) 2-3V LEFT COMPARISON:  CT 07/07/2012 . FINDINGS: No acute bony or joint abnormality identified. No evidence of fracture dislocation. IMPRESSION: No acute abnormality . Electronically Signed   By: Maisie Fus  Register   On: 09/13/2017 12:59    . Results for orders placed or performed in visit on 09/13/17  POCT Urinalysis Dipstick (Automated)  Result Value Ref Range   Color, UA light yellow    Clarity,  UA clear    Glucose, UA negative    Bilirubin, UA negative    Ketones, UA negative    Spec Grav, UA 1.015 1.010 - 1.025   Blood, UA negative    pH, UA 7.0 5.0 - 8.0   Protein, UA negative    Urobilinogen, UA 0.2 0.2 or 1.0 E.U./dL   Nitrite, UA negative    Leukocytes, UA Negative Negative     Assessment and Plan:   Acute left-sided low back pain without sciatica - Plan: DG Lumbar Spine Complete, predniSONE (DELTASONE) 20 MG tablet, meloxicam (MOBIC) 15 MG tablet, Ambulatory referral to Physical Therapy  Left hip pain - Plan: DG HIP UNILAT WITH PELVIS 2-3 VIEWS LEFT, predniSONE (DELTASONE) 20 MG tablet, meloxicam (MOBIC) 15 MG tablet, Ambulatory referral to Physical Therapy  Primary osteoarthritis of right knee - Plan: predniSONE (DELTASONE) 20 MG tablet, meloxicam (MOBIC) 15 MG tablet, Ambulatory referral to Physical Therapy  Urinary frequency - Plan: POCT Urinalysis Dipstick (Automated)  Primary issues seem to stem from the back as well as the knee.  She has diffuse osteoarthritic changes and degenerative changes in her lumbar spine.  She also has advanced knee arthritis on the right.  She is having a difficult time with the aging process, and with diffuse osteoarthritic changes limiting her  functionality.  I'm going to give her a ten-day course of prednisone to hopefully dampen back multiple of these symptoms involving multiple joints.  UA is clear.  PT consult  Follow-up: No Follow-up on file.  Future Appointments Date Time Provider Department Center  05/05/2018 10:30 AM Robert Bellow, LPN [facility]  05/12/2018 9:45 AM Joaquim Nam, MD [facility]    Meds ordered this encounter  Medications  . predniSONE (DELTASONE) 20 MG tablet    Sig: 2 tabs for 5 days, then 1 tab for 5 days    Dispense:  15 tablet    Refill:  0  . meloxicam (MOBIC) 15 MG tablet    Sig: Take 1 tablet (15 mg total) by mouth daily.    Dispense:  30 tablet    Refill:  2   Medications Discontinued During This Encounter  Medication Reason  . cephALEXin (KEFLEX) 500 MG capsule Completed Course  . fluocinonide cream (LIDEX) 0.05 % Completed Course   Orders Placed This Encounter  Procedures  . DG HIP UNILAT WITH PELVIS 2-3 VIEWS LEFT  . DG Lumbar Spine Complete  . Ambulatory referral to Physical Therapy  . POCT Urinalysis Dipstick (Automated)    Signed,  Sarinah Doetsch T. Kikue Gerhart, MD   Allergies as of 09/13/2017      Reactions   Lipitor [atorvastatin]    intolerant   Lisinopril Other (See Comments)   Intolerant, abd pain   Metoprolol Succinate    REACTION: Exhaustion, increased dizziness   Nitrofurantoin    REACTION: Rash   Sulfadiazine    REACTION: Rash   Yellow Jacket Venom [bee Venom]    Short of breath      Medication List       Accurate as of 09/13/17 11:59 PM. Always use your most recent med list.          acetaminophen 325 MG tablet Commonly known as:  TYLENOL Take 2 tablets (650 mg total) by mouth 3 (three) times daily as needed.   cholecalciferol 1000 units tablet Commonly known as:  VITAMIN D Take 1 tablet (1,000 Units total) by mouth daily.   LORazepam 1 MG tablet Commonly known as:  ATIVAN Take 1/4-1/2 tablet by mouth every 8 hours as needed     meloxicam 15 MG tablet Commonly known as:  MOBIC Take 1 tablet (15 mg total) by mouth daily.   phenazopyridine 200 MG tablet Commonly known as:  PYRIDIUM Take 1 tablet (200 mg total) by mouth 3 (three) times daily as needed for pain.   predniSONE 20 MG tablet Commonly known as:  DELTASONE 2 tabs for 5 days, then 1 tab for 5 days   PRESERVISION AREDS 2 PO Take by mouth.

## 2017-09-13 NOTE — Patient Instructions (Addendum)
Finish the full course of prednisone, then start Meloxicam 15 mg, one tablet a day.   OSTEOARTHRITIS:  For symptomatic relief: Tylenol: 2 tablets up to 3-4 times a day Regular NSAIDS are helpful (avoid in kidney disease and ulcers)  Topical Capzaicin Cream, as needed (wear glove to put on)  For flares, corticosteroid injections help. Hyaluronic Acid injections have good success, average relief is 6 months Glucosamine and Chondroitin often helpful - will take about 3 months to see if you have an effect. If you do, great, keep them up, if none at that point, no need to take in the future.  Omega-3 fish oils may help, 2 grams daily Ice joints on bad days, 20 min, 2-3 x / day  REGULAR EXERCISE: swimming, Yoga, Tai Chi, bicycle (NON-IMPACT activity)

## 2017-09-30 ENCOUNTER — Ambulatory Visit: Payer: PPO | Admitting: Family Medicine

## 2017-09-30 ENCOUNTER — Encounter: Payer: Self-pay | Admitting: Family Medicine

## 2017-09-30 VITALS — BP 140/90 | HR 68 | Temp 97.8°F | Ht 63.75 in | Wt 200.5 lb

## 2017-09-30 DIAGNOSIS — M1711 Unilateral primary osteoarthritis, right knee: Secondary | ICD-10-CM | POA: Diagnosis not present

## 2017-09-30 DIAGNOSIS — M7062 Trochanteric bursitis, left hip: Secondary | ICD-10-CM

## 2017-09-30 DIAGNOSIS — M545 Low back pain, unspecified: Secondary | ICD-10-CM

## 2017-09-30 DIAGNOSIS — M797 Fibromyalgia: Secondary | ICD-10-CM | POA: Diagnosis not present

## 2017-09-30 DIAGNOSIS — M25552 Pain in left hip: Secondary | ICD-10-CM

## 2017-09-30 MED ORDER — METHYLPREDNISOLONE 4 MG PO TBPK
ORAL_TABLET | ORAL | 0 refills | Status: DC
Start: 2017-09-30 — End: 2018-06-09

## 2017-09-30 MED ORDER — METHYLPREDNISOLONE ACETATE 40 MG/ML IJ SUSP
80.0000 mg | Freq: Once | INTRAMUSCULAR | Status: AC
  Administered 2017-09-30: 80 mg via INTRA_ARTICULAR

## 2017-09-30 NOTE — Progress Notes (Signed)
Dr. Karleen Hampshire T. Jennah Satchell, MD, CAQ Sports Medicine Primary [facility] and Sports Medicine [address] [place], [address] Phone: [phone] Fax: [phone]  09/30/2017  Patient: [name], MRN: [id number], DOB: 1941/06/16, 76 y.o.  Primary Physician:  Joaquim Nam, MD   Chief Complaint  Patient presents with  . Arthritis    Left Hip,Back, Leg and Feet    Subjective:   [name] is a 76 y.o. very pleasant female patient who presents with the following:  Multiple complaints. The patient really has complaints about multiple joints including both of her hips, her lateral hips, her back, her buttocks, her knees, as well as her feet and ankles. She does carry a diagnosis of fibromyalgia. Actually saw her about 2 and half weeks ago, and at that point thought that the primary problem was coming from her back gave her some prednisone, but she only took this for about 3 days.  Took some prednisone for about 3 days only. Diastolic BP got up to 90.   Lateral hip? Pain with every step.  She also is having some significant trochanteric bursitis on the left.  She was having some pain in the knee, but that pain is actually gotten quite a bit better.  Today, she also has some complaints of pain in her feet and ankles.  Past Medical History, Surgical History, Social History, Family History, Problem List, Medications, and Allergies have been reviewed and updated if relevant.  Patient Active Problem List   Diagnosis Date Noted  . Vitamin D deficiency 05/03/2017  . Bunion 05/03/2017  . Rash and nonspecific skin eruption 06/05/2016  . Skin cyst 06/05/2016  . History of nonmelanoma skin cancer 03/11/2015  . Dysuria 12/07/2014  . Anxiety state 10/31/2014  . Advance care planning 10/31/2014  . Iliotibial band syndrome 02/11/2014  . Hyperglycemia 09/17/2013  . OA (osteoarthritis) 12/04/2012  . 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 History of bone density study  06/12/2012  . Medicare annual wellness visit, subsequent 05/26/2012  . GERD 04/18/2009  . ABDOMINAL PAIN-EPIGASTRIC 04/18/2009  . Essential hypertension 03/26/2007  . URINARY TRACT INFECTION, RECURRENT 03/26/2007  . FIBROMYALGIA 03/26/2007    Past Medical History:  Diagnosis Date  . Anxiety   . Arthritis   . Breast cancer (HCC) 2001   s/p rady and lumpectomy  . Carcinoma in situ of breast 2001  . Cataract 1988, 1990   [facility]  . Esophageal reflux   . Fatty liver   . Fibromyalgia   . Hx of adenomatous colonic polyps   . Labyrinthitis, unspecified   . Pure hypercholesterolemia   . Unspecified essential hypertension   . Urinary tract infection, site not specified     Past Surgical History:  Procedure Laterality Date  . BREAST BIOPSY  07/2000   positive right side, [facility]   . BREAST LUMPECTOMY  08/20/2000   right breast, malignant stage I, [facility]  . CATARACT EXTRACTION  1988, 1990  . FOOT SURGERY  1980   minor     Social History   Socioeconomic History  . Marital status: Married    Spouse name: Not on file  . Number of children: Not on file  . Years of education: Not on file  . Highest education level: Not on file  Social Needs  . Financial resource strain: Not on file  . Food insecurity - worry: Not on file  . Food insecurity - inability: Not on file  . Transportation needs - medical:  Not on file  . Transportation needs - non-medical: Not on file  Occupational History  . Occupation: Housewife  Tobacco Use  . Smoking status: Never Smoker  . Smokeless tobacco: Never Used  Substance and Sexual Activity  . Alcohol use: No    Alcohol/week: 0.0 oz  . Drug use: No  . Sexual activity: Not on file  Other Topics Concern  . Not on file  Social History Narrative   Patient does not get regular exercise   Daily caffeine-one per day   Married 1958   Retired from [employer]    Family History  Problem Relation Age of Onset  . Prostate cancer Brother   . Heart  disease Brother   . Pancreatitis Brother        x 2  . Prostate cancer Brother   . Alzheimer's disease Father   . Dementia Father   . Parkinsonism Mother   . Heart disease Brother   . Heart disease Brother   . Breast cancer Paternal Aunt   . Colon cancer Neg Hx     Allergies  Allergen Reactions  . Lipitor [Atorvastatin]     intolerant  . Lisinopril Other (See Comments)    Intolerant, abd pain  . Metoprolol Succinate     REACTION: Exhaustion, increased dizziness  . Nitrofurantoin     REACTION: Rash  . Sulfadiazine     REACTION: Rash  . Yellow Jacket Venom [Bee Venom]     Short of breath    Medication list reviewed and updated in full in [facility].  GEN: No fevers, chills. Nontoxic. Primarily MSK c/o today. MSK: Detailed in the HPI GI: tolerating PO intake without difficulty Neuro: No numbness, parasthesias, or tingling associated. Otherwise the pertinent positives of the ROS are noted above.   Objective:   BP 140/90   Pulse 68   Temp 97.8 F (36.6 C) (Oral)   Ht 5' 3.75" (1.619 m)   Wt 200 lb 8 oz (90.9 kg)   BMI 34.69 kg/m    GEN: WDWN, NAD, Non-toxic, Alert & Oriented x 3 HEENT: Atraumatic, Normocephalic.  Ears and Nose: No external deformity. EXTR: No clubbing/cyanosis/edema NEURO: Normal gait.  PSYCH: Normally interactive. Conversant. Not depressed or anxious appearing.  Calm demeanor.   HIP EXAM: SIDE: L ROM: Abduction, Flexion, Internal and External range of motion: full Pain with terminal IROM and EROM: pain with terminal EROM GTB: L SLR: NEG Knees: No effusion FABER: NT REVERSE FABER: NT, neg Piriformis: NT at direct palpation Str: flexion: 3+/5 abduction: 3+/5 adduction: 4+/5 Strength testing non-tender   Radiology: Dg Lumbar Spine Complete  Result Date: 09/13/2017 CLINICAL DATA:  77 year old female with acute left-sided back pain. No reported trauma. Initial encounter. EXAM: LUMBAR SPINE - COMPLETE 4+ VIEW COMPARISON:   07/07/2012 CT. FINDINGS: Mild L3-4, mild to moderate L4-5 and moderate to marked L5-S1 disc space narrowing. Facet degenerative changes L2-3 through L5-S1. No acute compression fracture noted. IMPRESSION: Mild L3-4, mild to moderate L4-5 and moderate to marked L5-S1 disc space narrowing. Facet degenerative changes L2-3 through L5-S1. Electronically Signed   By: Lacy Duverney M.D.   On: 09/13/2017 13:18   Dg Hip Unilat With Pelvis 2-3 Views Left  Result Date: 09/13/2017 CLINICAL DATA:  Left hip pain.  No reported injury . EXAM: DG HIP (WITH OR WITHOUT PELVIS) 2-3V LEFT COMPARISON:  CT 07/07/2012 . FINDINGS: No acute bony or joint abnormality identified. No evidence of fracture dislocation. IMPRESSION: No acute abnormality . Electronically Signed  ByMaisie Fus  Register   On: 09/13/2017 12:59    Assessment and Plan:   Trochanteric bursitis, left hip  Left hip pain - Plan: methylPREDNISolone acetate (DEPO-MEDROL) injection 80 mg  Acute left-sided low back pain without sciatica  Primary osteoarthritis of right knee  Fibromyalgia syndrome  Primary issue is the patient spine.  Secondary trochanteric bursitis. Multiple pain complaints.  Suspect fibromyalgia significant component.  L hip support muscles have become very weak. A rehabilitation program from the [employer] was reviewed with the patient face to face for their condition.   Medrol dosepak will hopefully help her other pain / MSK issues.  Trochanteric Bursitis Injection, L Verbal consent obtained. Risks (including infection, potential atrophy), benefits, and alternatives reviewed. Greater trochanter sterilely prepped with Chloraprep. Ethyl Chloride used for anesthesia. 8 cc of Lidocaine 1% injected with 2 mL of Depo-Medrol 40 mg into trochanteric bursa at area of maximal tenderness at greater trochanter. Needle taken to bone to troch bursa, flows easily. Bursa massaged. No bleeding and no complications.  Decreased pain after injection. Needle: 22 gauge spinal needle    Follow-up: No Follow-up on file.  Future Appointments  Date Time Provider Department Center  05/05/2018 10:30 AM Robert Bellow, LPN [facility]  05/12/2018  9:45 AM Joaquim Nam, MD [facility]    Meds ordered this encounter  Medications  . methylPREDNISolone (MEDROL DOSEPAK) 4 MG TBPK tablet    Sig: Disp 1 box, use as directed on pack    Dispense:  21 tablet    Refill:  0  . methylPREDNISolone acetate (DEPO-MEDROL) injection 80 mg   Signed,  Jahmil Macleod T. Rashada Klontz, MD   Allergies as of 09/30/2017      Reactions   Lipitor [atorvastatin]    intolerant   Lisinopril Other (See Comments)   Intolerant, abd pain   Metoprolol Succinate    REACTION: Exhaustion, increased dizziness   Nitrofurantoin    REACTION: Rash   Sulfadiazine    REACTION: Rash   Yellow Jacket Venom [bee Venom]    Short of breath      Medication List        Accurate as of 09/30/17 11:59 PM. Always use your most recent med list.          acetaminophen 325 MG tablet Commonly known as:  TYLENOL Take 2 tablets (650 mg total) by mouth 3 (three) times daily as needed.   cholecalciferol 1000 units tablet Commonly known as:  VITAMIN D Take 1 tablet (1,000 Units total) by mouth daily.   LORazepam 1 MG tablet Commonly known as:  ATIVAN Take 1/4-1/2 tablet by mouth every 8 hours as needed   meloxicam 15 MG tablet Commonly known as:  MOBIC Take 1 tablet (15 mg total) by mouth daily.   methylPREDNISolone 4 MG Tbpk tablet Commonly known as:  MEDROL DOSEPAK Disp 1 box, use as directed on pack   phenazopyridine 200 MG tablet Commonly known as:  PYRIDIUM Take 1 tablet (200 mg total) by mouth 3 (three) times daily as needed for pain.   predniSONE 20 MG tablet Commonly known as:  DELTASONE 2 tabs for 5 days, then 1 tab for 5 days   PRESERVISION AREDS 2 PO Take by mouth.

## 2017-09-30 NOTE — Patient Instructions (Signed)
Start the medrol dosepak on Wednesday.

## 2017-12-11 ENCOUNTER — Encounter: Payer: Self-pay | Admitting: Family Medicine

## 2017-12-11 ENCOUNTER — Ambulatory Visit (INDEPENDENT_AMBULATORY_CARE_PROVIDER_SITE_OTHER): Payer: PPO | Admitting: Family Medicine

## 2017-12-11 ENCOUNTER — Other Ambulatory Visit: Payer: Self-pay

## 2017-12-11 VITALS — BP 140/76 | HR 73 | Temp 97.4°F | Ht 63.75 in | Wt 204.8 lb

## 2017-12-11 DIAGNOSIS — Z23 Encounter for immunization: Secondary | ICD-10-CM | POA: Diagnosis not present

## 2017-12-11 DIAGNOSIS — M797 Fibromyalgia: Secondary | ICD-10-CM | POA: Diagnosis not present

## 2017-12-11 DIAGNOSIS — M1711 Unilateral primary osteoarthritis, right knee: Secondary | ICD-10-CM

## 2017-12-11 MED ORDER — METHYLPREDNISOLONE ACETATE 40 MG/ML IJ SUSP
40.0000 mg | Freq: Once | INTRAMUSCULAR | Status: AC
  Administered 2017-12-11: 40 mg via INTRAMUSCULAR

## 2017-12-11 NOTE — Progress Notes (Signed)
Dr. Karleen Hampshire T. Chirstopher Iovino, MD, CAQ [facility] [address] [place], [address] Phone: [phone] Fax: [phone]  12/11/2017  Patient: [name], MRN: [id number], DOB: Jul 31, 1941, 77 y.o.  Primary Physician:  Joaquim Nam, MD   Chief Complaint  Patient presents with  . Knee Pain    Right   Subjective:   [name] is a 77 y.o. very pleasant female patient who presents with the following:  R OA: Patient presents primarily in follow-up for right knee pain, with significant arthritis of the right knee, particularly with advanced osteoarthritis in the lateral compartment, but with significant tricompartmental osteoarthritis overall.  I did aspirate her knee about 6 or 7 months ago and she had some good relief of symptoms since then.  Over the last few weeks she has had a really a lot of pain and is been limping a lot as well as having difficulty with her back.  She having pain in her back on the left side as well as some left-sided sciatica type symptoms.  She also asked me multiple questions about medical care in general, the aging body, degenerative joint disease and I did my best to answer.  asp  Past Medical History, Surgical History, Social History, Family History, Problem List, Medications, and Allergies have been reviewed and updated if relevant.  Patient Active Problem List   Diagnosis Date Noted  . Vitamin D deficiency 05/03/2017  . Bunion 05/03/2017  . Rash and nonspecific skin eruption 06/05/2016  . Skin cyst 06/05/2016  . History of nonmelanoma skin cancer 03/11/2015  . Dysuria 12/07/2014  . Anxiety state 10/31/2014  . Advance care planning 10/31/2014  . Iliotibial band syndrome 02/11/2014  . Hyperglycemia 09/17/2013  . OA (osteoarthritis) 12/04/2012  . 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 History of bone density study 06/12/2012  . Medicare annual wellness visit, subsequent 05/26/2012  . GERD  04/18/2009  . ABDOMINAL PAIN-EPIGASTRIC 04/18/2009  . Essential hypertension 03/26/2007  . URINARY TRACT INFECTION, RECURRENT 03/26/2007  . Fibromyalgia syndrome 03/26/2007    Past Medical History:  Diagnosis Date  . Anxiety   . Arthritis   . Breast cancer (HCC) 2001   s/p rady and lumpectomy  . Carcinoma in situ of breast 2001  . Cataract 1988, 1990   [facility]  . Esophageal reflux   . Fatty liver   . Fibromyalgia   . Hx of adenomatous colonic polyps   . Labyrinthitis, unspecified   . Pure hypercholesterolemia   . Unspecified essential hypertension   . Urinary tract infection, site not specified     Past Surgical History:  Procedure Laterality Date  . BREAST BIOPSY  07/2000   positive right side, [facility]   . BREAST LUMPECTOMY  08/20/2000   right breast, malignant stage I, [facility]  . CATARACT EXTRACTION  1988, 1990  . FOOT SURGERY  1980   minor     Social History   Socioeconomic History  . Marital status: Married    Spouse name: Not on file  . Number of children: Not on file  . Years of education: Not on file  . Highest education level: Not on file  Social Needs  . Financial resource strain: Not on file  . Food insecurity - worry: Not on file  . Food insecurity - inability: Not on file  . Transportation needs - medical: Not on file  . Transportation needs - non-medical: Not on file  Occupational History  .  Occupation: Housewife  Tobacco Use  . Smoking status: Never Smoker  . Smokeless tobacco: Never Used  Substance and Sexual Activity  . Alcohol use: No    Alcohol/week: 0.0 oz  . Drug use: No  . Sexual activity: Not on file  Other Topics Concern  . Not on file  Social History Narrative   Patient does not get regular exercise   Daily caffeine-one per day   Married 1958   Retired from [employer]    Family History  Problem Relation Age of Onset  . Prostate cancer Brother   . Heart disease Brother   . Pancreatitis Brother        x 2  . Prostate cancer  Brother   . Alzheimer's disease Father   . Dementia Father   . Parkinsonism Mother   . Heart disease Brother   . Heart disease Brother   . Breast cancer Paternal Aunt   . Colon cancer Neg Hx     Allergies  Allergen Reactions  . Lipitor [Atorvastatin]     intolerant  . Lisinopril Other (See Comments)    Intolerant, abd pain  . Metoprolol Succinate     REACTION: Exhaustion, increased dizziness  . Nitrofurantoin     REACTION: Rash  . Sulfadiazine     REACTION: Rash  . Yellow Jacket Venom [Bee Venom]     Short of breath    Medication list reviewed and updated in full in [facility].  GEN: No fevers, chills. Nontoxic. Primarily MSK c/o today. MSK: Detailed in the HPI GI: tolerating PO intake without difficulty Neuro: No numbness, parasthesias, or tingling associated. Otherwise the pertinent positives of the ROS are noted above.   Objective:   BP 140/76   Pulse 73   Temp (!) 97.4 F (36.3 C) (Oral)   Ht 5' 3.75" (1.619 m)   Wt 204 lb 12 oz (92.9 kg)   BMI 35.42 kg/m    GEN: WDWN, NAD, Non-toxic, A & O x 3 HEENT: Atraumatic, Normocephalic. Neck supple. No masses, No LAD. Ears and Nose: No external deformity. CV: RRR, No M/G/R. No JVD. No thrill. No extra heart sounds. PULM: CTA B, no wheezes, crackles, rhonchi. No retractions. No resp. distress. No accessory muscle use. EXTR: No c/c/e NEURO Normal gait.  PSYCH: Normally interactive. Conversant. Not depressed or anxious appearing.  Calm demeanor.    Right knee: Patient lacks 2 degrees of extension.  Flexion to 95 degrees.  She has notable tenderness on medial and lateral joint lines.  Tenderness with patellar compression.  Stable MCL and LCL.  ACL and PCL are also stable.  McMurray's and flexion pinch both cause considerable pain.  Radiology: No results found.  Assessment and Plan:   Osteoarthrosis, localized, primary, knee, right - Plan: methylPREDNISolone acetate (DEPO-MEDROL) injection 40  mg  Fibromyalgia syndrome  >25 minutes spent in face to face time with patient, >50% spent in counselling or coordination of care   Advanced right-sided osteoarthritis of the knee.  Fibromyalgia is also confounder, and likely give the patient increased pain sensations overall in her body.  Additional time spent discussing other joints as well as impact from fibromyalgia, weight, strength and fitness.   Knee Aspiration and Injection, R Patient verbally consented; risks, benefits, and alternatives explained including possible infection. Patient prepped with Chloraprep. Ethyl chloride for anesthesia. 10 cc of 1% Lidocaine used in wheal then injected Subcutaneous fashion with 22 gauge needle on lateral approach. Under sterilne conditions, 18 gauge needle used via  lateral approach to aspirate 40 cc of serosanguinous fluid. Then 8 cc of Lidocaine 1% and 2 mL of Depo-Medrol 40 mg injected. Tolerated well, decreased pain, no complications.   Follow-up: No Follow-up on file.   Patient Instructions  Take Tylenol/Acetaminophen ES (500mg ) 2 tabs by mouth three times a day max as needed.   Motrin 600 - 800 mg recommended TID. (Over the counter Motrin, Advil, or Generic Ibuprofen 200 mg tablets. 3-4 tablets by mouth 3 times a day. This equals a prescription strength dose.)     Meds ordered this encounter  Medications  . methylPREDNISolone acetate (DEPO-MEDROL) injection 40 mg   Medications Discontinued During This Encounter  Medication Reason  . meloxicam (MOBIC) 15 MG tablet    Signed,  Mica Releford T. Dickie Labarre, MD   Allergies as of 12/11/2017      Reactions   Lipitor [atorvastatin]    intolerant   Lisinopril Other (See Comments)   Intolerant, abd pain   Metoprolol Succinate    REACTION: Exhaustion, increased dizziness   Nitrofurantoin    REACTION: Rash   Sulfadiazine    REACTION: Rash   Yellow Jacket Venom [bee Venom]    Short of breath      Medication List        Accurate as of  12/11/17 12:35 PM. Always use your most recent med list.          acetaminophen 325 MG tablet Commonly known as:  TYLENOL Take 2 tablets (650 mg total) by mouth 3 (three) times daily as needed.   cholecalciferol 1000 units tablet Commonly known as:  VITAMIN D Take 1 tablet (1,000 Units total) by mouth daily.   LORazepam 1 MG tablet Commonly known as:  ATIVAN Take 1/4-1/2 tablet by mouth every 8 hours as needed   methylPREDNISolone 4 MG Tbpk tablet Commonly known as:  MEDROL DOSEPAK Disp 1 box, use as directed on pack   phenazopyridine 200 MG tablet Commonly known as:  PYRIDIUM Take 1 tablet (200 mg total) by mouth 3 (three) times daily as needed for pain.   predniSONE 20 MG tablet Commonly known as:  DELTASONE 2 tabs for 5 days, then 1 tab for 5 days   PRESERVISION AREDS 2 PO Take by mouth.

## 2017-12-11 NOTE — Patient Instructions (Signed)
Take Tylenol/Acetaminophen ES (500mg ) 2 tabs by mouth three times a day max as needed.   Motrin 600 - 800 mg recommended TID. (Over the counter Motrin, Advil, or Generic Ibuprofen 200 mg tablets. 3-4 tablets by mouth 3 times a day. This equals a prescription strength dose.)

## 2017-12-11 NOTE — Addendum Note (Signed)
Addended by: Carter Kitten on: 12/11/2017 05:39 PM   Modules accepted: Orders

## 2018-05-05 ENCOUNTER — Ambulatory Visit: Payer: PPO

## 2018-05-07 DIAGNOSIS — L03115 Cellulitis of right lower limb: Secondary | ICD-10-CM | POA: Diagnosis not present

## 2018-05-12 ENCOUNTER — Encounter: Payer: PPO | Admitting: Family Medicine

## 2018-06-02 ENCOUNTER — Other Ambulatory Visit: Payer: Self-pay | Admitting: Family Medicine

## 2018-06-02 ENCOUNTER — Ambulatory Visit: Payer: PPO

## 2018-06-02 DIAGNOSIS — R739 Hyperglycemia, unspecified: Secondary | ICD-10-CM

## 2018-06-02 DIAGNOSIS — E559 Vitamin D deficiency, unspecified: Secondary | ICD-10-CM

## 2018-06-02 DIAGNOSIS — E785 Hyperlipidemia, unspecified: Secondary | ICD-10-CM

## 2018-06-09 ENCOUNTER — Ambulatory Visit (INDEPENDENT_AMBULATORY_CARE_PROVIDER_SITE_OTHER): Payer: PPO | Admitting: Family Medicine

## 2018-06-09 ENCOUNTER — Encounter: Payer: Self-pay | Admitting: Family Medicine

## 2018-06-09 VITALS — BP 146/80 | HR 71 | Temp 98.1°F | Ht 63.75 in | Wt 199.0 lb

## 2018-06-09 DIAGNOSIS — R609 Edema, unspecified: Secondary | ICD-10-CM | POA: Diagnosis not present

## 2018-06-09 DIAGNOSIS — W5503XA Scratched by cat, initial encounter: Secondary | ICD-10-CM | POA: Diagnosis not present

## 2018-06-09 DIAGNOSIS — R202 Paresthesia of skin: Secondary | ICD-10-CM | POA: Diagnosis not present

## 2018-06-09 DIAGNOSIS — M797 Fibromyalgia: Secondary | ICD-10-CM

## 2018-06-09 DIAGNOSIS — F411 Generalized anxiety disorder: Secondary | ICD-10-CM

## 2018-06-09 NOTE — Patient Instructions (Signed)
Go to the lab on the way out.  We'll contact you with your lab report. Try putting plain hydrocortisone on your leg if needed for itching.  Elevate your legs when possible.  It may be that you need treatment for fibromyalgia but let me see your labs first.  Take care.  Glad to see you.

## 2018-06-09 NOTE — Progress Notes (Signed)
Two of her cats were fighting and scratched her.  She was trying to break up the fight.  She had scratched on R medial shin.  Both cats are vaccinated.  Was seen at [facility], about 1 month ago.  She took 10 days of abx.  Area is slow to heal, still with a local depression but not open.  Not red or hot, no fevers.    Rare use of BZD.  No ADE on med.    She fell about 10 days ago.  She is diffusely sore.  She fell on her knees and her knees are more sore in the meantime. She is using a cane at baseline.  Fall cautions d/w pt.  She doesn't have shoulder pain.  She had prev been told she had fibromyalgia.  Had diffuse leg and back aches prior to the fall w/o radicular sx.    She has gradually worsening BLE edema. Worse at the end of the day, a little better the next AM.  Not SOB.  Not SOB supine.  No CP.  Not lightheaded.    She has B feet tingling over the last few weeks. Variable severity.  Usually concurrent.    ROS: Per HPI unless specifically indicated in ROS section   Meds, vitals, and allergies reviewed.   GEN: nad, alert and oriented HEENT: mucous membranes moist NECK: supple w/o LA CV: rrr. PULM: ctab, no inc wob ABD: soft, +bs EXT: 1+ BLE edema SKIN: no acute rash but 1x1cm scabbed area on the medial R lower leg. SLR neg B  Foot exam: Normal inspection except for bunions noted, using B 1st webspace toe separators  No skin breakdown No calluses  Normal DP pulses Normal sensation to light touch and monofilament Nails normal

## 2018-06-10 LAB — CBC WITH DIFFERENTIAL/PLATELET
Basophils Absolute: 0 10*3/uL (ref 0.0–0.1)
Basophils Relative: 0.7 % (ref 0.0–3.0)
Eosinophils Absolute: 0.2 10*3/uL (ref 0.0–0.7)
Eosinophils Relative: 2.9 % (ref 0.0–5.0)
HCT: 39.7 % (ref 36.0–46.0)
Hemoglobin: 13.5 g/dL (ref 12.0–15.0)
Lymphocytes Relative: 17.5 % (ref 12.0–46.0)
Lymphs Abs: 1.3 10*3/uL (ref 0.7–4.0)
MCHC: 33.9 g/dL (ref 30.0–36.0)
MCV: 88.9 fl (ref 78.0–100.0)
Monocytes Absolute: 0.6 10*3/uL (ref 0.1–1.0)
Monocytes Relative: 7.5 % (ref 3.0–12.0)
Neutro Abs: 5.3 10*3/uL (ref 1.4–7.7)
Neutrophils Relative %: 71.4 % (ref 43.0–77.0)
Platelets: 234 10*3/uL (ref 150.0–400.0)
RBC: 4.47 Mil/uL (ref 3.87–5.11)
RDW: 12.8 % (ref 11.5–15.5)
WBC: 7.4 10*3/uL (ref 4.0–10.5)

## 2018-06-10 LAB — COMPREHENSIVE METABOLIC PANEL
ALT: 13 U/L (ref 0–35)
AST: 12 U/L (ref 0–37)
Albumin: 4.2 g/dL (ref 3.5–5.2)
Alkaline Phosphatase: 88 U/L (ref 39–117)
BUN: 16 mg/dL (ref 6–23)
CO2: 29 mEq/L (ref 19–32)
Calcium: 9.4 mg/dL (ref 8.4–10.5)
Chloride: 107 mEq/L (ref 96–112)
Creatinine, Ser: 0.93 mg/dL (ref 0.40–1.20)
GFR: 61.86 mL/min (ref >60.00)
Glucose, Bld: 118 mg/dL — ABNORMAL HIGH (ref 70–99)
Potassium: 4.1 mEq/L (ref 3.5–5.1)
Sodium: 142 mEq/L (ref 135–145)
Total Bilirubin: 0.6 mg/dL (ref 0.2–1.2)
Total Protein: 6.9 g/dL (ref 6.0–8.3)

## 2018-06-10 LAB — CK: Total CK: 54 U/L (ref 7–177)

## 2018-06-10 LAB — TSH: TSH: 3.99 u[IU]/mL (ref 0.35–4.50)

## 2018-06-10 LAB — VITAMIN B12: Vitamin B-12: 246 pg/mL (ref 211–911)

## 2018-06-10 LAB — SEDIMENTATION RATE: Sed Rate: 13 mm/hr (ref 0–30)

## 2018-06-11 DIAGNOSIS — R609 Edema, unspecified: Secondary | ICD-10-CM | POA: Insufficient documentation

## 2018-06-11 DIAGNOSIS — R202 Paresthesia of skin: Secondary | ICD-10-CM | POA: Insufficient documentation

## 2018-06-11 DIAGNOSIS — W5503XA Scratched by cat, initial encounter: Secondary | ICD-10-CM | POA: Insufficient documentation

## 2018-06-11 NOTE — Assessment & Plan Note (Signed)
Unclear source.  Reasonable to check routine labs today.  See notes on labs.  She has normal monofilament sensation on exam today.  D/w pt about ddx.  >25 minutes spent in face to face time with patient, >50% spent in counselling or coordination of care.

## 2018-06-11 NOTE — Assessment & Plan Note (Signed)
It sounds like she has dependent edema that is worse at the end of the day.  Lungs are clear.  Still okay for outpatient follow-up.  See notes on labs.

## 2018-06-11 NOTE — Assessment & Plan Note (Signed)
Rare use of benzodiazepine.  Continue as is.  Update me as needed.  She agrees.  Routine cautions given.

## 2018-06-11 NOTE — Assessment & Plan Note (Signed)
She is been told years ago that she had fibromyalgia.  She has a history of diffuse muscle aches in the leg and back.  Straight leg raise is negative today.  Routine fall cautions given regarding the recent incident.  She is using a cane at baseline.  I want to check her labs first and I told her we get in touch with her about potential options regarding fibromyalgia treatment.  She agrees.

## 2018-06-11 NOTE — Assessment & Plan Note (Signed)
Appears to be healing.  No need for another course of antibiotics at this point.  See after visit summary.  Routine cautions given.

## 2018-06-12 ENCOUNTER — Encounter: Payer: PPO | Admitting: Family Medicine

## 2018-06-13 ENCOUNTER — Telehealth: Payer: Self-pay | Admitting: Family Medicine

## 2018-06-13 NOTE — Telephone Encounter (Signed)
Spoke to pt and advised labs have been resulted but annotated. Advised she will be contacted by phone or mail once labs have been reviewed

## 2018-06-13 NOTE — Telephone Encounter (Signed)
Copied from [place] [id number]. Topic: Quick Communication - Lab Results >> Jun 13, 2018 10:41 AM Burchel, Abbi R wrote:   Pt would like a call re: Labs from 06/09/18  Pt: [phone]

## 2018-06-13 NOTE — Telephone Encounter (Incomplete)
Copied from [place] [id number]. Topic: Quick Communication - Lab Results >> Jun 13, 2018 10:44 AM Burchel, Abbi R wrote: {CHL AMB CRM LAB RESULTS:[id number]}  Pt would like a call back re: labs on 06/09/18.  Pt: [phone]

## 2018-06-16 ENCOUNTER — Telehealth: Payer: Self-pay | Admitting: Family Medicine

## 2018-06-16 NOTE — Telephone Encounter (Signed)
I've missed time with an illness.  Will reply when possible.  Thanks.

## 2018-06-16 NOTE — Telephone Encounter (Signed)
Copied from [place] [id number]. Topic: Quick Communication - Lab Results >> Jun 16, 2018 12:33 PM Cecelia Byars, NT wrote: Patient called and would like a return call in regards to her labs from last week please call her at [phone]

## 2018-06-17 ENCOUNTER — Other Ambulatory Visit: Payer: Self-pay | Admitting: Family Medicine

## 2018-06-17 MED ORDER — VITAMIN B-12 1000 MCG PO TABS: 1000.0000 ug | ORAL_TABLET | Freq: Every day | ORAL | Status: DC

## 2018-06-17 MED ORDER — GABAPENTIN 100 MG PO CAPS: 100.0000 mg | ORAL_CAPSULE | Freq: Every day | ORAL | 3 refills | Status: DC

## 2018-06-17 NOTE — Telephone Encounter (Signed)
Copied from [place] [phone]. Topic: Quick Communication - Lab Results >> Jun 16, 2018 12:33 PM Cecelia Byars, NT wrote: Patient called and would like a return call in regard to her labs from last week please call her at [phone] >> Jun 16, 2018  4:19 PM Ahmed Prima L wrote: Patient would like a call today with her labs or she said she will be getting another doctor because she had the labs done on 7/15.

## 2018-06-17 NOTE — Telephone Encounter (Signed)
Delay in response related to MD illness.   She can get another doc if she chooses.  If she leaves because she is unhappy with care, then it would make sense for her husband should find another doc too.  I wish them both the best.

## 2018-06-17 NOTE — Telephone Encounter (Signed)
Patient states that no one told her that Dr. Damita Dunnings was sick.  Patient says she has never had to wait that long for lab results and she was worried.  I explained that in most cases, labs are reported here within a few days but that at some practices, it is not unusual to wait 7 to 10 days for relatively normal lab results.  Patient says she still loves Dr. Damita Dunnings and wants him to be her MD and her husband's MD.

## 2018-06-17 NOTE — Telephone Encounter (Signed)
Copied from [place] [phone]. Topic: General - Call Back - No Documentation >> Jun 17, 2018  9:28 AM Conception Chancy, NT wrote: Reason for CRM: patient is calling and states she missed a phone call from the office and states she is still waiting on her lab results. Please contact patient.

## 2018-06-17 NOTE — Telephone Encounter (Signed)
Noted. Thanks.

## 2018-06-27 ENCOUNTER — Ambulatory Visit: Payer: Self-pay | Admitting: Family Medicine

## 2018-06-27 NOTE — Telephone Encounter (Signed)
Noted. Thanks.

## 2018-06-27 NOTE — Telephone Encounter (Signed)
Patient is calling to report that she is having right knee swelling- she has had fluid drained from the knee in the past and she feels that she needs to have that done again. Patient request appointment with Dr Lorelei Pont- - offered earlier appointment with other providers in [facility] that could help her with her knee- but she is not comfortable driving there and she wants to see Dr Lorelei Pont- appointment made for Monday.  Patient also complains of UTI- she states she has burning with urination. No appointment is available with PCP and patient does not want to travel to [place]. She states she will go to [facility] for [facility] today. Reason for Disposition . [1] Very swollen joint AND [2] no fever  Additional Information . Commented on: Side (flank) or lower back pain present    Painful urination  Answer Assessment - Initial Assessment Questions 1. LOCATION: "Where is the swelling located?"  (e.g., left, right, both knees)     Mainly in R knee- left is painful as well 2. SIZE and DESCRIPTION: "What does the swelling look like?"  (e.g., entire knee, localized)     Fluid is present- patient states she has had fluid drained before 3. ONSET: "When did the swelling start?" "Does it come and go, or is it there all the time?"     Builds over time- patient is having pain- she is using walker or cane 4. PAIN: "Is there any pain?" If so, ask: "How bad is it?" (Scale 1-10; or mild, moderate, severe)     Walking- 8 5. SETTING: "Has there been any recent work, exercise or other activity that involved that part of the body?"      Hx fluid on knee- patient has been told she is going to need surgery 6. AGGRAVATING FACTORS: "What makes the knee swelling worse?" (e.g., walking, climbing stairs, running)     Gradually gathers- does not go away 7. ASSOCIATED SYMPTOMS: "Is there any pain or redness?"     Pain 8. OTHER SYMPTOMS: "Do you have any other symptoms?" (e.g., chest pain, difficulty breathing, fever, calf  pain)     no 9. PREGNANCY: "Is there any chance you are pregnant?" "When was your last menstrual period?"     n/a  Answer Assessment - Initial Assessment Questions 1. SYMPTOM: "What's the main symptom you're concerned about?" (e.g., frequency, incontinence)     Burning with urination 2. ONSET: "When did the  burning  start?"     4-5 days ago 3. PAIN: "Is there any pain?" If so, ask: "How bad is it?" (Scale: 1-10; mild, moderate, severe)     moderate 4. CAUSE: "What do you think is causing the symptoms?"     UTI 5. OTHER SYMPTOMS: "Do you have any other symptoms?" (e.g., fever, flank pain, blood in urine, pain with urination)     no 6. PREGNANCY: "Is there any chance you are pregnant?" "When was your last menstrual period?"     n/a  Protocols used: KNEE SWELLING-A-AH, URINARY [facility]

## 2018-06-30 ENCOUNTER — Encounter: Payer: Self-pay | Admitting: Family Medicine

## 2018-06-30 ENCOUNTER — Ambulatory Visit (INDEPENDENT_AMBULATORY_CARE_PROVIDER_SITE_OTHER): Payer: PPO | Admitting: Family Medicine

## 2018-06-30 VITALS — BP 140/78 | HR 66 | Temp 97.5°F | Ht 63.75 in | Wt 201.5 lb

## 2018-06-30 DIAGNOSIS — R300 Dysuria: Secondary | ICD-10-CM | POA: Diagnosis not present

## 2018-06-30 DIAGNOSIS — M170 Bilateral primary osteoarthritis of knee: Secondary | ICD-10-CM | POA: Diagnosis not present

## 2018-06-30 DIAGNOSIS — M7918 Myalgia, other site: Secondary | ICD-10-CM

## 2018-06-30 DIAGNOSIS — I872 Venous insufficiency (chronic) (peripheral): Secondary | ICD-10-CM | POA: Diagnosis not present

## 2018-06-30 DIAGNOSIS — M797 Fibromyalgia: Secondary | ICD-10-CM

## 2018-06-30 LAB — POC URINALSYSI DIPSTICK (AUTOMATED)
Bilirubin, UA: NEGATIVE
Blood, UA: NEGATIVE
Glucose, UA: NEGATIVE
Ketones, UA: NEGATIVE
Leukocytes, UA: NEGATIVE
Nitrite, UA: NEGATIVE
Protein, UA: NEGATIVE
Spec Grav, UA: 1.015 (ref 1.010–1.025)
Urobilinogen, UA: 0.2 E.U./dL
pH, UA: 6 (ref 5.0–8.0)

## 2018-06-30 MED ORDER — METHYLPREDNISOLONE ACETATE 40 MG/ML IJ SUSP
80.0000 mg | Freq: Once | INTRAMUSCULAR | Status: AC
Start: 2018-06-30 — End: 2018-06-30
  Administered 2018-06-30: 80 mg via INTRA_ARTICULAR

## 2018-06-30 MED ORDER — METHYLPREDNISOLONE ACETATE 40 MG/ML IJ SUSP
80.0000 mg | Freq: Once | INTRAMUSCULAR | Status: AC
  Administered 2018-06-30: 80 mg via INTRA_ARTICULAR

## 2018-06-30 NOTE — Patient Instructions (Signed)
Take Tylenol/Acetaminophen ES (500mg ) 2 tabs by mouth three times a day max as needed.   Alleve 1 tabs by mouth two times a day over the counter: as you need it - you do not need to take it every day (GENERIC CHEAPER EQUIVALENT IS NAPROXEN SODIUM)   Compression socks - try [employer]

## 2018-06-30 NOTE — Progress Notes (Signed)
Dr. Karleen Hampshire T. Jex Strausbaugh, MD, CAQ [facility] [address] [place], [address] Phone: [phone] Fax: [phone]  06/30/2018  Patient: [name], MRN: [id number], DOB: 1941/07/18, 77 y.o.  Primary Physician:  Joaquim Nam, MD   Chief Complaint  Patient presents with  . Knee Pain    Bilateral  . Burning with Urination   Subjective:   [name] is a 77 y.o. very pleasant female patient who presents with the following:  Is a talk to the patient she has a whelp with complaints and pain throughout almost her entire body.  She has pain in her back, hips, knees, legs, as well as the foot and ankle.  She is previously been given a diagnosis of fibromyalgia.  I talked about this myself with her multiple times.  She remains basically inactive except for day-to-day activities.  She also has bilateral lower extremity edema and asked me why.  She also questions whether or not she has good blood supply in her lower extremity.  She also has a number of other medical complaints and medical questions that ultimately I am going to have to defer to her medical doctor.  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  She has significant pain in both of her knees currently, she is having pain getting up and rising from seated position and she is having pain getting in and out of the car.  Both knees are bothering her   Taking some tylenol and ibuprofen and alleve Maybe the alleve helps more  Fibromyalgia   Inject both knees. Asp R knee  Past Medical History, Surgical History, Social History, Family History, Problem List, Medications, and Allergies have been reviewed and updated if relevant.  Patient Active Problem List   Diagnosis Date Noted  . Edema 06/11/2018  . Paresthesia 06/11/2018  . Cat scratch 06/11/2018  . Vitamin D deficiency 05/03/2017  . Bunion 05/03/2017  .  Rash and nonspecific skin eruption 06/05/2016  . Skin cyst 06/05/2016  . History of nonmelanoma skin cancer 03/11/2015  . Dysuria 12/07/2014  . Anxiety state 10/31/2014  . Advance care planning 10/31/2014  . Iliotibial band syndrome 02/11/2014  . Hyperglycemia 09/17/2013  . OA (osteoarthritis) 12/04/2012  . 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 History of bone density study 06/12/2012  . Medicare annual wellness visit, subsequent 05/26/2012  . GERD 04/18/2009  . ABDOMINAL PAIN-EPIGASTRIC 04/18/2009  . URINARY TRACT INFECTION, RECURRENT 03/26/2007  . Fibromyalgia syndrome 03/26/2007    Past Medical History:  Diagnosis Date  . Anxiety   . Arthritis   . Breast cancer (HCC) 2001   s/p rady and lumpectomy  . Carcinoma in situ of breast 2001  . Cataract 1988, 1990   [facility]  . Esophageal reflux   . Fatty liver   . Fibromyalgia   . Hx of adenomatous colonic polyps   . Labyrinthitis, unspecified   . Pure hypercholesterolemia   . Unspecified essential hypertension   . Urinary tract infection, site not specified     Past Surgical History:  Procedure Laterality Date  . BREAST BIOPSY  07/2000   positive right side, [facility]   . BREAST LUMPECTOMY  08/20/2000   right breast, malignant stage I, [facility]  . CATARACT EXTRACTION  1988, 1990  . FOOT SURGERY  1980   minor     Social History   Socioeconomic History  . Marital status: Married  Spouse name: Not on file  . Number of children: Not on file  . Years of education: Not on file  . Highest education level: Not on file  Occupational History  . Occupation: Housewife  Social Needs  . Financial resource strain: Not on file  . Food insecurity:    Worry: Not on file    Inability: Not on file  . Transportation needs:    Medical: Not on file    Non-medical: Not on file  Tobacco Use  . Smoking status: Never Smoker  . Smokeless tobacco: Never Used  Substance and Sexual Activity  . Alcohol use: No    Alcohol/week: 0.0  oz  . Drug use: No  . Sexual activity: Not on file  Lifestyle  . Physical activity:    Days per week: Not on file    Minutes per session: Not on file  . Stress: Not on file  Relationships  . Social connections:    Talks on phone: Not on file    Gets together: Not on file    Attends religious service: Not on file    Active member of club or organization: Not on file    Attends meetings of clubs or organizations: Not on file    Relationship status: Not on file  . Intimate partner violence:    Fear of current or ex partner: Not on file    Emotionally abused: Not on file    Physically abused: Not on file    Forced sexual activity: Not on file  Other Topics Concern  . Not on file  Social History Narrative   Patient does not get regular exercise   Daily caffeine-one per day   Married 1958   Retired from [employer]    Family History  Problem Relation Age of Onset  . Prostate cancer Brother   . Heart disease Brother   . Pancreatitis Brother        x 2  . Prostate cancer Brother   . Alzheimer's disease Father   . Dementia Father   . Parkinsonism Mother   . Heart disease Brother   . Heart disease Brother   . Breast cancer Paternal Aunt   . Colon cancer Neg Hx     Allergies  Allergen Reactions  . Lipitor [Atorvastatin]     intolerant  . Lisinopril Other (See Comments)    Intolerant, abd pain  . Metoprolol Succinate     REACTION: Exhaustion, increased dizziness  . Nitrofurantoin     REACTION: Rash  . Sulfadiazine     REACTION: Rash  . Yellow Jacket Venom [Bee Venom]     Short of breath    Medication list reviewed and updated in full in [facility].  GEN: No fevers, chills. Nontoxic. Primarily MSK c/o today. MSK: Detailed in the HPI GI: tolerating PO intake without difficulty Neuro: No numbness, parasthesias, or tingling associated. Otherwise the pertinent positives of the ROS are noted above.   Objective:   BP 140/78   Pulse 66   Temp (!) 97.5 F (36.4  C) (Oral)   Ht 5' 3.75" (1.619 m)   Wt 201 lb 8 oz (91.4 kg)   BMI 34.86 kg/m    GEN: WDWN, NAD, Non-toxic, Alert & Oriented x 3 HEENT: Atraumatic, Normocephalic.  Ears and Nose: No external deformity. EXTR: No clubbing/cyanosis/1+ lower extremity edema  nEURO: Normal gait.  PSYCH: Normally interactive. Conversant. Not depressed or anxious appearing.  Calm demeanor.    Right knee, moderate  effusion.  Left knee, no significant effusion.  The right knee lacks 4 degrees of extension and has flexion to 85 degrees.  Left knee flexion is to 105 degrees.  Stable to varus and valgus stress bilaterally.  ACL and PCL appear intact.  Joint lines are tender bilaterally.  Radiology: No results found.  Assessment and Plan:   Arthritis of both knees - Plan: methylPREDNISolone acetate (DEPO-MEDROL) injection 80 mg, methylPREDNISolone acetate (DEPO-MEDROL) injection 80 mg  Burning with urination - Plan: POCT Urinalysis Dipstick (Automated), Urine Culture  Myofascial pain syndrome  Fibromyalgia syndrome  Venous insufficiency  >25 minutes spent in face to face time with patient, >50% spent in counselling or coordination of care   Patient had a lot of complaints today, and I think that the biggest issue is she has a myofascial pain syndrome that is involving much of her body.  I tried to go over various ways that this can be treated including swimming and some basic physical activity.  I did my best to answer the patient's questions, but at times she asked the same questions between 3 and 5 times.  No apparent UTI on UA, but we will check a culture.  She may have venous insufficiency, she does have some edema of the lower extremity, I recommended that she get some compression socks.  Today, we can immediately offer her some relief with some injections in both of her knees and I am going to aspirate her right knee.  Patient Instructions  Take Tylenol/Acetaminophen ES (500mg ) 2 tabs by mouth  three times a day max as needed.   Alleve 1 tabs by mouth two times a day over the counter: as you need it - you do not need to take it every day (GENERIC CHEAPER EQUIVALENT IS NAPROXEN SODIUM)   Compression socks - try Omega sports    Knee Aspiration and Injection, R Patient verbally consented; risks, benefits, and alternatives explained including possible infection. Patient prepped with Chloraprep. Ethyl chloride for anesthesia. 10 cc of 1% Lidocaine used in wheal then injected Subcutaneous fashion with 22 gauge needle on lateral approach. Under sterilne conditions, 18 gauge needle used via lateral approach to aspirate 30 cc of yellowish synovial fluid. Then 8 cc of Lidocaine 1% and 2 mL of Depo-Medrol 40 mg injected. Tolerated well, decreased pain, no complications.   Knee Injection, L Patient verbally consented to procedure. Risks (including potential rare risk of infection), benefits, and alternatives explained. Sterilely prepped with Chloraprep. Ethyl cholride used for anesthesia. 8 cc Lidocaine 1% mixed with 2 mL Depo-Medrol 40 mg injected using the anteromedial approach without difficulty. No complications with procedure and tolerated well. Patient had decreased pain post-injection.   Follow-up: prn  Patient Instructions  Take Tylenol/Acetaminophen ES (500mg ) 2 tabs by mouth three times a day max as needed.   Alleve 1 tabs by mouth two times a day over the counter: as you need it - you do not need to take it every day (GENERIC CHEAPER EQUIVALENT IS NAPROXEN SODIUM)   Compression socks - try Omega sports    Meds ordered this encounter  Medications  . methylPREDNISolone acetate (DEPO-MEDROL) injection 80 mg  . methylPREDNISolone acetate (DEPO-MEDROL) injection 80 mg   Orders Placed This Encounter  Procedures  . Urine Culture  . POCT Urinalysis Dipstick (Automated)    Signed,  Cleland Simkins T. Chukwuebuka Churchill, MD   Allergies as of 06/30/2018      Reactions   Lipitor [atorvastatin]     intolerant  Lisinopril Other (See Comments)   Intolerant, abd pain   Metoprolol Succinate    REACTION: Exhaustion, increased dizziness   Nitrofurantoin    REACTION: Rash   Sulfadiazine    REACTION: Rash   Yellow Jacket Venom [bee Venom]    Short of breath      Medication List        Accurate as of 06/30/18  2:07 PM. Always use your most recent med list.          acetaminophen 325 MG tablet Commonly known as:  TYLENOL Take 2 tablets (650 mg total) by mouth 3 (three) times daily as needed.   cholecalciferol 1000 units tablet Commonly known as:  VITAMIN D Take 1 tablet (1,000 Units total) by mouth daily.   gabapentin 100 MG capsule Commonly known as:  NEURONTIN Take 1-2 capsules (100-200 mg total) by mouth at bedtime.   LORazepam 1 MG tablet Commonly known as:  ATIVAN Take 1/4-1/2 tablet by mouth every 8 hours as needed   PRESERVISION AREDS 2 PO Take by mouth.   vitamin B-12 1000 MCG tablet Commonly known as:  CYANOCOBALAMIN Take 1 tablet (1,000 mcg total) by mouth daily.

## 2018-07-01 ENCOUNTER — Telehealth: Payer: Self-pay | Admitting: Family Medicine

## 2018-07-01 LAB — URINE CULTURE
MICRO NUMBER:: 90922941
SPECIMEN QUALITY:: ADEQUATE

## 2018-07-01 NOTE — Telephone Encounter (Signed)
I would take 2 extra strength tylenol and 2 over the counter ibuprofen at the same time.   She could do this up to 3 times a day is she needs to.

## 2018-07-01 NOTE — Telephone Encounter (Signed)
Ms. Rosenstock notified as instructed by telephone.  She states her knees are already about 90 % better.

## 2018-07-01 NOTE — Telephone Encounter (Signed)
Copied from [place] [phone]. Topic: Quick Communication - See Telephone Encounter >> Jul 01, 2018  8:50 AM Mylinda Latina, NT wrote: CRM for notification. See Telephone encounter for: 07/01/18. Patient called and states that she received 2 prednisone injections in her knees and since then she is experiencing a bad headache. She is call to see what she can take for the headache., Please advise. Thank you CB# [phone]

## 2018-07-01 NOTE — Telephone Encounter (Signed)
Pt last seen 06/30/18.

## 2018-07-07 ENCOUNTER — Ambulatory Visit: Payer: PPO | Admitting: Family Medicine

## 2018-07-09 ENCOUNTER — Other Ambulatory Visit: Payer: Self-pay | Admitting: Family Medicine

## 2018-07-09 NOTE — Telephone Encounter (Signed)
Last office visit 06/30/18/acute Last refill 06/17/18 #60/1 Pharmacy is requesting a 90 day supply, see note

## 2018-07-09 NOTE — Telephone Encounter (Signed)
Sent. Thanks.   

## 2018-08-19 ENCOUNTER — Ambulatory Visit: Payer: PPO

## 2018-08-19 ENCOUNTER — Telehealth: Payer: Self-pay

## 2018-08-19 NOTE — Telephone Encounter (Signed)
Copied from [place] [id number]. Topic: Appointment Scheduling - Scheduling Inquiry for Clinic >> Aug 18, 2018  8:52 AM Conception Chancy, NT wrote: Reason for CRM: patient and spouse had to cancel physical appointment due to a death in the family. They would like to reschedule for back to back appointments. They have a wellness visit on 09/05/18. There is nothing in the time frame for back to back appointments after that visit. Please contact to schedule.  Is it ok to find a spot for CPE for patient and her spouse on a day and book it back to back?-Anastasiya [employer], RMA

## 2018-08-20 NOTE — Telephone Encounter (Signed)
Left message to call back, looking at 09/15/18 if they can come-Kaylina Cahue V Domitila Stetler, RMA

## 2018-08-20 NOTE — Telephone Encounter (Signed)
Please give my condolences about the death in the family.   Okay to try to schedule back to back visits with me on a day other than 09/05/18.   Please get 44min visits for each of them.  Thanks.

## 2018-08-26 ENCOUNTER — Encounter: Payer: PPO | Admitting: Family Medicine

## 2018-08-26 NOTE — Telephone Encounter (Signed)
Left message to call back-Jazir Newey Estell Harpin, RMA

## 2018-08-29 NOTE — Telephone Encounter (Signed)
Added note to appt for 09/05/18 to have pt scheduled for CPE. I have not heard back from [employer], RMA

## 2018-09-05 ENCOUNTER — Ambulatory Visit (INDEPENDENT_AMBULATORY_CARE_PROVIDER_SITE_OTHER): Payer: PPO

## 2018-09-05 VITALS — BP 124/90 | HR 74 | Temp 97.7°F | Ht 63.5 in | Wt 205.0 lb

## 2018-09-05 DIAGNOSIS — E559 Vitamin D deficiency, unspecified: Secondary | ICD-10-CM

## 2018-09-05 DIAGNOSIS — R350 Frequency of micturition: Secondary | ICD-10-CM

## 2018-09-05 DIAGNOSIS — Z23 Encounter for immunization: Secondary | ICD-10-CM | POA: Diagnosis not present

## 2018-09-05 DIAGNOSIS — E785 Hyperlipidemia, unspecified: Secondary | ICD-10-CM

## 2018-09-05 DIAGNOSIS — R300 Dysuria: Secondary | ICD-10-CM

## 2018-09-05 DIAGNOSIS — Z0000 Encounter for general adult medical examination without abnormal findings: Secondary | ICD-10-CM

## 2018-09-05 DIAGNOSIS — Z87440 Personal history of urinary (tract) infections: Secondary | ICD-10-CM | POA: Diagnosis not present

## 2018-09-05 DIAGNOSIS — R739 Hyperglycemia, unspecified: Secondary | ICD-10-CM

## 2018-09-05 LAB — VITAMIN D 25 HYDROXY (VIT D DEFICIENCY, FRACTURES): VITD: 16.1 ng/mL — ABNORMAL LOW (ref 30.00–100.00)

## 2018-09-05 LAB — LIPID PANEL
Cholesterol: 205 mg/dL — ABNORMAL HIGH (ref 0–200)
HDL: 49 mg/dL (ref >39.00)
LDL Cholesterol: 135 mg/dL — ABNORMAL HIGH (ref 0–99)
NonHDL: 155.69
Total CHOL/HDL Ratio: 4
Triglycerides: 105 mg/dL (ref 0.0–149.0)
VLDL: 21 mg/dL (ref 0.0–40.0)

## 2018-09-05 LAB — COMPREHENSIVE METABOLIC PANEL
ALT: 10 U/L (ref 0–35)
AST: 12 U/L (ref 0–37)
Albumin: 4.2 g/dL (ref 3.5–5.2)
Alkaline Phosphatase: 87 U/L (ref 39–117)
BUN: 19 mg/dL (ref 6–23)
CO2: 29 mEq/L (ref 19–32)
Calcium: 9.4 mg/dL (ref 8.4–10.5)
Chloride: 107 mEq/L (ref 96–112)
Creatinine, Ser: 0.87 mg/dL (ref 0.40–1.20)
GFR: 66.76 mL/min (ref >60.00)
Glucose, Bld: 105 mg/dL — ABNORMAL HIGH (ref 70–99)
Potassium: 4.7 mEq/L (ref 3.5–5.1)
Sodium: 142 mEq/L (ref 135–145)
Total Bilirubin: 0.9 mg/dL (ref 0.2–1.2)
Total Protein: 6.9 g/dL (ref 6.0–8.3)

## 2018-09-05 LAB — POC URINALSYSI DIPSTICK (AUTOMATED)
Bilirubin, UA: NEGATIVE
Blood, UA: NEGATIVE
Glucose, UA: NEGATIVE
Ketones, UA: NEGATIVE
Leukocytes, UA: NEGATIVE
Nitrite, UA: NEGATIVE
Protein, UA: NEGATIVE
Spec Grav, UA: 1.025 (ref 1.010–1.025)
Urobilinogen, UA: 0.2 E.U./dL
pH, UA: 5.5 (ref 5.0–8.0)

## 2018-09-05 LAB — HEMOGLOBIN A1C: Hgb A1c MFr Bld: 5.5 % (ref 4.6–6.5)

## 2018-09-05 NOTE — Patient Instructions (Signed)
Ms. Rathod , Thank you for taking time to come for your Medicare Wellness Visit. I appreciate your ongoing commitment to your health goals. Please review the following plan we discussed and let me know if I can assist you in the future.   These are the goals we discussed: Goals    . Increase water intake     Starting 09/05/2018, I will attempt to drink at least 6-8 glasses daily.        This is a list of the screening recommended for you and due dates:  Health Maintenance  Topic Date Due  . Flu Shot  06/26/2018  . Colon Cancer Screening  11/26/2019*  . Tetanus Vaccine  03/14/2021  . DEXA scan (bone density measurement)  Completed  . Pneumonia vaccines  Completed  *Topic was postponed. The date shown is not the original due date.   Preventive Care for Adults  A healthy lifestyle and preventive care can promote health and wellness. Preventive health guidelines for adults include the following key practices.  . A routine yearly physical is a good way to check with your health care provider about your health and preventive screening. It is a chance to share any concerns and updates on your health and to receive a thorough exam.  . Visit your dentist for a routine exam and preventive care every 6 months. Brush your teeth twice a day and floss once a day. Good oral hygiene prevents tooth decay and gum disease.  . The frequency of eye exams is based on your age, health, family medical history, use  of contact lenses, and other factors. Follow your health care provider's recommendations for frequency of eye exams.  . Eat a healthy diet. Foods like vegetables, fruits, whole grains, low-fat dairy products, and lean protein foods contain the nutrients you need without too many calories. Decrease your intake of foods high in solid fats, added sugars, and salt. Eat the right amount of calories for you. Get information about a proper diet from your health care provider, if necessary.  . Regular  physical exercise is one of the most important things you can do for your health. Most adults should get at least 150 minutes of moderate-intensity exercise (any activity that increases your heart rate and causes you to sweat) each week. In addition, most adults need muscle-strengthening exercises on 2 or more days a week.  Silver Sneakers may be a benefit available to you. To determine eligibility, you may visit the website: www.silversneakers.com or contact program at 928-291-2220 Mon-Fri between 8AM-8PM.   . Maintain a healthy weight. The body mass index (BMI) is a screening tool to identify possible weight problems. It provides an estimate of body fat based on height and weight. Your health care provider can find your BMI and can help you achieve or maintain a healthy weight.   For adults 20 years and older: ? A BMI below 18.5 is considered underweight. ? A BMI of 18.5 to 24.9 is normal. ? A BMI of 25 to 29.9 is considered overweight. ? A BMI of 30 and above is considered obese.   . Maintain normal blood lipids and cholesterol levels by exercising and minimizing your intake of saturated fat. Eat a balanced diet with plenty of fruit and vegetables. Blood tests for lipids and cholesterol should begin at age 52 and be repeated every 5 years. If your lipid or cholesterol levels are high, you are over 50, or you are at high risk for heart disease, you  may need your cholesterol levels checked more frequently. Ongoing high lipid and cholesterol levels should be treated with medicines if diet and exercise are not working.  . If you smoke, find out from your health care provider how to quit. If you do not use tobacco, please do not start.  . If you choose to drink alcohol, please do not consume more than 2 drinks per day. One drink is considered to be 12 ounces (355 mL) of beer, 5 ounces (148 mL) of wine, or 1.5 ounces (44 mL) of liquor.  . If you are 43-79 years old, ask your health care provider if  you should take aspirin to prevent strokes.  . Use sunscreen. Apply sunscreen liberally and repeatedly throughout the day. You should seek shade when your shadow is shorter than you. Protect yourself by wearing long sleeves, pants, a wide-brimmed hat, and sunglasses year round, whenever you are outdoors.  . Once a month, do a whole body skin exam, using a mirror to look at the skin on your back. Tell your health care provider of new moles, moles that have irregular borders, moles that are larger than a pencil eraser, or moles that have changed in shape or color.

## 2018-09-05 NOTE — Progress Notes (Signed)
Subjective:   [name] is a 77 y.o. female who presents for Medicare Annual (Subsequent) preventive examination.  Review of Systems:  N/A Cardiac Risk Factors include: advanced age (>75men, >62 women);obesity (BMI >30kg/m2);hypertension     Objective:     Vitals: BP 124/90 (BP Location: Left Arm, Patient Position: Sitting, Cuff Size: Large)   Pulse 74   Temp 97.7 F (36.5 C) (Oral)   Ht 5' 3.5" (1.613 m) Comment: no shoes  Wt 205 lb (93 kg)   SpO2 97%   BMI 35.74 kg/m   Body mass index is 35.74 kg/m.  Advanced Directives 09/05/2018 04/26/2017  Does Patient Have a Medical Advance Directive? No No  Would patient like information on creating a medical advance directive? No - Patient declined -    Tobacco Social History   Tobacco Use  Smoking Status Never Smoker  Smokeless Tobacco Never Used     Counseling given: No   Clinical Intake:  Pre-visit preparation completed: Yes  Pain Score: 7 (bilateral knees and legs, lower back)     Nutritional Status: BMI > 30  Obese Nutritional Risks: None Diabetes: No  How often do you need to have someone help you when you read instructions, pamphlets, or other written materials from your doctor or pharmacy?: 1 - Never What is the last grade level you completed in school?: 12th grade + some college courses  Interpreter Needed?: No  Comments: pt lives with spouse Information entered by :: LPinson, LPN  Past Medical History:  Diagnosis Date  . Anxiety   . Arthritis   . Breast cancer ([place]) 2001   s/p rady and lumpectomy  . Carcinoma in situ of breast 2001  . Cataract 1988, [place]  . Esophageal reflux   . Fatty liver   . Fibromyalgia   . Hx of adenomatous colonic polyps   . Labyrinthitis, unspecified   . Pure hypercholesterolemia   . Unspecified essential hypertension   . Urinary tract infection, site not specified    Past Surgical History:  Procedure Laterality Date  . BREAST BIOPSY   07/2000   positive right side, [facility]   . BREAST LUMPECTOMY  08/20/2000   right breast, malignant stage I, [facility]  . CATARACT EXTRACTION  1988, 1990  . FOOT SURGERY  1980   minor    Family History  Problem Relation Age of Onset  . Prostate cancer Brother   . Heart disease Brother   . Pancreatitis Brother        x 2  . Prostate cancer Brother   . Alzheimer's disease Father   . Dementia Father   . Parkinsonism Mother   . Heart disease Brother   . Heart disease Brother   . Breast cancer Paternal Aunt   . Colon cancer Neg Hx    Social History   Socioeconomic History  . Marital status: Married    Spouse name: Not on file  . Number of children: Not on file  . Years of education: Not on file  . Highest education level: Not on file  Occupational History  . Occupation: Housewife  Social Needs  . Financial resource strain: Not on file  . Food insecurity:    Worry: Not on file    Inability: Not on file  . Transportation needs:    Medical: Not on file    Non-medical: Not on file  Tobacco Use  . Smoking status: Never Smoker  . Smokeless tobacco: Never Used  Substance  and Sexual Activity  . Alcohol use: No    Alcohol/week: 0.0 standard drinks  . Drug use: No  . Sexual activity: Not Currently  Lifestyle  . Physical activity:    Days per week: Not on file    Minutes per session: Not on file  . Stress: Not on file  Relationships  . Social connections:    Talks on phone: Not on file    Gets together: Not on file    Attends religious service: Not on file    Active member of club or organization: Not on file    Attends meetings of clubs or organizations: Not on file    Relationship status: Not on file  Other Topics Concern  . Not on file  Social History Narrative   Patient does not get regular exercise   Daily caffeine-one per day   Married 1958   Retired from [employer] Encounter Medications as of 09/05/2018  Medication Sig  . acetaminophen (TYLENOL) 325 MG  tablet Take 2 tablets (650 mg total) by mouth 3 (three) times daily as needed.  . cholecalciferol (VITAMIN D) 1000 units tablet Take 1 tablet (1,000 Units total) by mouth daily.  Marland Kitchen gabapentin (NEURONTIN) 100 MG capsule TAKE 1-2 CAPSULES (100-200 MG TOTAL) BY MOUTH AT BEDTIME.  . LORazepam (ATIVAN) 1 MG tablet Take 1/4-1/2 tablet by mouth every 8 hours as needed  . Multiple Vitamins-Minerals (PRESERVISION AREDS 2 PO) Take by mouth.  . vitamin B-12 (CYANOCOBALAMIN) 1000 MCG tablet Take 1 tablet (1,000 mcg total) by mouth daily.   No facility-administered encounter medications on file as of 09/05/2018.     Activities of Daily Living In your present state of health, do you have any difficulty performing the following activities: 09/05/2018  Hearing? N  Vision? N  Difficulty concentrating or making decisions? N  Walking or climbing stairs? Y  Dressing or bathing? N  Doing errands, shopping? N  Preparing Food and eating ? N  Using the Toilet? N  In the past six months, have you accidently leaked urine? Y  Do you have problems with loss of bowel control? N  Managing your Medications? N  Managing your Finances? N  Housekeeping or managing your Housekeeping? N  Some recent data might be hidden    Patient Care Team: Tonia Ghent, MD as PCP - General (Family Medicine)    Assessment:   This is a routine wellness examination for [place].   Hearing Screening   125Hz  250Hz  500Hz  1000Hz  2000Hz  3000Hz  4000Hz  6000Hz  8000Hz   Right ear:   40 40 40  40    Left ear:   40 40 40  40      Visual Acuity Screening   Right eye Left eye Both eyes  Without correction:     With correction: 20/30-1 20/30-1 20/30-1     Exercise Activities and Dietary recommendations Current Exercise Habits: The patient does not participate in regular exercise at present, Exercise limited by: orthopedic condition(s)  Goals    . Increase water intake     Starting 09/05/2018, I will attempt to drink at least 6-8  glasses daily.        Fall Risk Fall Risk  09/05/2018 04/26/2017 02/24/2016 10/28/2014 09/17/2013  Falls in the past year? Yes Yes No No No  Comment fell as result of foot getting tangled in a rope; bruising to both knees after falling on rocks pt fell on slippery deck; no injury or medical treatment - - -  Number falls in past yr: 1 1 - - -  Injury with Fall? Yes No - - -  Risk for fall due to : Impaired balance/gait;Impaired mobility - - - -   Depression Screen PHQ 2/9 Scores 09/05/2018 04/26/2017 02/24/2016 10/28/2014  PHQ - 2 Score 1 0 0 0  PHQ- 9 Score 1 - - -     Cognitive Function MMSE - Mini Mental State Exam 09/05/2018 04/26/2017  Orientation to time 5 5  Orientation to Place 5 5  Registration 3 3  Attention/ Calculation 0 0  Recall 3 3  Language- name 2 objects 0 0  Language- repeat 1 1  Language- follow 3 step command 3 3  Language- read & follow direction 0 0  Write a sentence 0 0  Copy design 0 0  Total score 20 20     PLEASE NOTE: A Mini-Cog screen was completed. Maximum score is 20. A value of 0 denotes this part of Folstein MMSE was not completed or the patient failed this part of the Mini-Cog screening.   Mini-Cog Screening Orientation to Time - Max 5 pts Orientation to Place - Max 5 pts Registration - Max 3 pts Recall - Max 3 pts Language Repeat - Max 1 pts Language Follow 3 Step Command - Max 3 pts     Immunization History  Administered Date(s) Administered  . Influenza Whole 10/07/2006, 10/22/2007, 09/20/2009, 09/20/2010  . Influenza,inj,Quad PF,6+ Mos 08/27/2013, 10/28/2014, 11/07/2016, 12/11/2017, 09/05/2018  . Pneumococcal Conjugate-13 02/24/2016  . Pneumococcal Polysaccharide-23 08/23/1999, 05/26/2012  . Td 07/16/2006, 03/15/2011  . Zoster 03/10/2009    Screening Tests Health Maintenance  Topic Date Due  . COLONOSCOPY  11/26/2019 (Originally 05/30/2017)  . TETANUS/TDAP  03/14/2021  . INFLUENZA VACCINE  Completed  . DEXA SCAN  Completed  . PNA  vac Low Risk Adult  Completed      Plan:     I have personally reviewed, addressed, and noted the following in the patient's chart:  A. Medical and social history B. Use of alcohol, tobacco or illicit drugs  C. Current medications and supplements D. Functional ability and status E.  Nutritional status F.  Physical activity G. Advance directives H. List of other physicians I.  Hospitalizations, surgeries, and ER visits in previous 12 months J.  [place] to include hearing, vision, cognitive, depression L. Referrals and appointments - none  In addition, I have reviewed and discussed with patient certain preventive protocols, quality metrics, and best practice recommendations. A written personalized care plan for preventive services as well as general preventive health recommendations were provided to patient.  See attached scanned questionnaire for additional information.   Signed,   Lindell Noe, MHA, BS, LPN Health Coach

## 2018-09-05 NOTE — Progress Notes (Signed)
PCP notes:   Health maintenance:  Colonoscopy - addressed Flu vaccine - administered  Abnormal screenings:   Fall risk - hx of single fall Fall Risk  09/05/2018 04/26/2017 02/24/2016 10/28/2014 09/17/2013  Falls in the past year? Yes Yes No No No  Comment fell as result of foot getting tangled in a rope; bruising to both knees after falling on rocks pt fell on slippery deck; no injury or medical treatment - - -  Number falls in past yr: 1 1 - - -  Injury with Fall? Yes No - - -  Risk for fall due to : Impaired balance/gait;Impaired mobility - - - -   Depression score: 1 Depression screen [facility] 2/9 09/05/2018 04/26/2017 02/24/2016 10/28/2014 09/17/2013  Decreased Interest 0 0 0 0 0  Down, Depressed, Hopeless 1 0 0 0 0  PHQ - 2 Score 1 0 0 0 0  Altered sleeping 0 - - - -  Tired, decreased energy 0 - - - -  Change in appetite 0 - - - -  Feeling bad or failure about yourself  0 - - - -  Trouble concentrating 0 - - - -  Moving slowly or fidgety/restless 0 - - - -  Suicidal thoughts 0 - - - -  PHQ-9 Score 1 - - - -  Difficult doing work/chores Not difficult at all - - - -   Patient concerns:   Patient reported concerns with burning with urination and urinary frequency. PCP notified. UA and CS ordered.   Patient has concerns with bilateral knee, bilateral leg, and lower back pain.   Patient reports concerns with clicking noises in left ear. States it has become more frequent over previous 2 weeks.   Nurse concerns:  None  Next PCP appt:   09/09/18 @ 1130  I reviewed health advisor's note, was available for consultation on the day of service listed in this note, and agree with documentation and plan. [name], MD.

## 2018-09-06 LAB — URINE CULTURE
MICRO NUMBER:: 91225749
SPECIMEN QUALITY:: ADEQUATE

## 2018-09-09 ENCOUNTER — Ambulatory Visit (INDEPENDENT_AMBULATORY_CARE_PROVIDER_SITE_OTHER): Payer: PPO | Admitting: Family Medicine

## 2018-09-09 ENCOUNTER — Encounter: Payer: Self-pay | Admitting: Family Medicine

## 2018-09-09 VITALS — BP 124/90 | HR 74 | Temp 97.7°F | Ht 63.5 in | Wt 205.0 lb

## 2018-09-09 DIAGNOSIS — M1990 Unspecified osteoarthritis, unspecified site: Secondary | ICD-10-CM | POA: Diagnosis not present

## 2018-09-09 DIAGNOSIS — F411 Generalized anxiety disorder: Secondary | ICD-10-CM | POA: Diagnosis not present

## 2018-09-09 DIAGNOSIS — Z7189 Other specified counseling: Secondary | ICD-10-CM

## 2018-09-09 DIAGNOSIS — Z0000 Encounter for general adult medical examination without abnormal findings: Secondary | ICD-10-CM

## 2018-09-09 DIAGNOSIS — R609 Edema, unspecified: Secondary | ICD-10-CM

## 2018-09-09 DIAGNOSIS — H9319 Tinnitus, unspecified ear: Secondary | ICD-10-CM | POA: Diagnosis not present

## 2018-09-09 DIAGNOSIS — M797 Fibromyalgia: Secondary | ICD-10-CM | POA: Diagnosis not present

## 2018-09-09 DIAGNOSIS — E559 Vitamin D deficiency, unspecified: Secondary | ICD-10-CM

## 2018-09-09 DIAGNOSIS — R300 Dysuria: Secondary | ICD-10-CM

## 2018-09-09 MED ORDER — VITAMIN D (ERGOCALCIFEROL) 1.25 MG (50000 UNIT) PO CAPS: 50000.0000 [IU] | ORAL_CAPSULE | ORAL | 0 refills | Status: DC

## 2018-09-09 NOTE — Progress Notes (Signed)
Patient reported concerns with burning with urination and urinary frequency.  Some better in the meantime.  She used cystex in the meantime.  Urine culture was negative.  Discussed with patient.  Patient has concerns with bilateral knee, bilateral leg, and lower back pain.  Her knee pain and leg swelling got better after prev injection per Dr. Lorelei Pont, then she has return of knee pain with weight bearing but not at rest. Using cane as needed.  She continues to have B lower back and B leg pain.    Prev imaging with mild L3-4, mild to moderate L4-5 and moderate to marked L5-S1 disc space narrowing. Facet degenerative changes L2-3 through L5-S1.  She is not yet on gabapentin at night.    Patient reports clicking noises in left ear. Episodic. Noted in the AM.  Rapid clicking intermittently.  Not on R side. Goes on for a few seconds. No pain.  No vertigo.    Flu 2019 Shingles 2010 PNA UTD Tetanus 2012 Colonoscopy 2013 Breast cancer screening due, d/w pt.   DXA defer for now, given her other concerns.  She agrees.  Advance directive- husband then daughter designated.     See after visit summary.  Rare use of BZD. No ADE on med.    Her brother recently died and I offered my condolences.    Vit D def d/w pt.    PMH and SH reviewed ROS: Per HPI unless specifically indicated in ROS section   Meds, vitals, and allergies reviewed.   GEN: nad, alert and oriented HEENT: mucous membranes moist, TM wnl B NECK: supple w/o LA CV: rrr PULM: ctab, no inc wob ABD: soft, +bs EXT: 1+ BLE edema SKIN: no acute rash Lower midline back ttp w/o rash.   SLR neg but pain at the B knees with full extension.

## 2018-09-09 NOTE — Patient Instructions (Addendum)
You can call for a mammogram at [facility] at [facility].  [place] may have a muscle spasm/twitch causing the ear trouble.  It you can't put up with it, then we can send you to ENT.    Try taking gabapentin and let me know about your leg pain.  We can get you back to see Dr. Lorelei Pont if needed.    Take vitamin D once a week and recheck a lab test in about 3 months.   Try compression stockings for the leg swelling.    Take care.  Glad to see you.

## 2018-09-11 DIAGNOSIS — Z0000 Encounter for general adult medical examination without abnormal findings: Secondary | ICD-10-CM | POA: Insufficient documentation

## 2018-09-11 DIAGNOSIS — H9319 Tinnitus, unspecified ear: Secondary | ICD-10-CM | POA: Insufficient documentation

## 2018-09-11 NOTE — Assessment & Plan Note (Signed)
Rare use of BZD. No ADE on med.   Condolences offered regarding the death of her brother.

## 2018-09-11 NOTE — Assessment & Plan Note (Signed)
Unremarkable exam.  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  Discussed with patient.  Would observe for now.  We can refer to ENT if needed.

## 2018-09-11 NOTE — Assessment & Plan Note (Signed)
Discussed trying compression stockings for the extremities. Update me as needed

## 2018-09-11 NOTE — Assessment & Plan Note (Signed)
Culture negative.  She improved some with Cystex.  Continue to drink plenty of fluid.  Update me as needed.

## 2018-09-11 NOTE — Assessment & Plan Note (Signed)
Flu 2019 Shingles 2010 PNA UTD Tetanus 2012 Colonoscopy 2013 Breast cancer screening due, d/w pt.   DXA defer for now, given her other concerns.  She agrees.  Advance directive- husband then daughter designated if patient were incapacitated.

## 2018-09-11 NOTE — Assessment & Plan Note (Signed)
We talked about a potential fibromyalgia diagnosis.  She is having lower back pain and leg pain.  If this were from fibromyalgia then it may be reasonable to try gabapentin.  If it were not from fibromyalgia it still may be reasonable to try gabapentin.  It makes sense to try that first before trying advanced imaging.  Discussed with patient.  All questions answered.  Routine cautions given. >25 minutes spent in face to face time with patient, >50% spent in counselling or coordination of care.

## 2018-09-11 NOTE — Assessment & Plan Note (Signed)
Advance directive- husband then daughter designated if patient were incapacitated. 

## 2018-09-11 NOTE — Assessment & Plan Note (Signed)
Start higher dose replacement and recheck vitamin D later on.  See orders.  She agrees.

## 2018-09-11 NOTE — Assessment & Plan Note (Signed)
We can get her back to see the [facility] medicine clinic if needed.

## 2018-10-16 ENCOUNTER — Encounter: Payer: PPO | Admitting: Family Medicine

## 2018-11-12 ENCOUNTER — Telehealth: Payer: Self-pay | Admitting: Family Medicine

## 2018-11-12 ENCOUNTER — Encounter: Payer: Self-pay | Admitting: Family Medicine

## 2018-11-12 NOTE — Telephone Encounter (Signed)
Tried calling pt voicemail full If pt calls back please let her know her appointment on 1/6 with dr copland time has changed to 11

## 2018-11-30 NOTE — Progress Notes (Signed)
Dr. Karleen Hampshire T. Berry Godsey, MD, CAQ Sports Medicine Primary Care and Sports Medicine [address] [place], [address] Phone: [phone] Fax: [phone]  12/01/2018  Patient: [name], MRN: [id number], DOB: 14-Oct-1941, 78 y.o.  Primary Physician:  Joaquim Nam, MD   Chief Complaint  Patient presents with  . Knee Pain    Bilateral  . Joint Swelling   Subjective:   [name] is a 78 y.o. very pleasant female patient who presents with the following:  Patient with fibromyalgia and B knee OA here for B knee pain.  5 months since last OV.  She is reasonably upset today and recently had a brother passed away, and this is a second brother that she has lost in the last 15 months.  She is having increased bilateral knee pain, bilateral swelling of both knees and increased crepitus over time.  She has known osteoarthritis of the knees, Body mass index is 35.44 kg/m.  She also has fibromyalgia.  At this point she is not considering any type of operative intervention.  She has had good relief of symptoms from corticosteroid injection in the past.  She is also reasonably upset today, and she is intermittently crying in the office.  She also asked me a number of different questions that do not really relate to her knees at all.  We also review her prior lumbar spine films and basic back pain.  She also has some pain in her legs as well as some lower extremity edema.  She asked me a number of questions about this as well.  Asp R knee and inj only L knee  Past Medical History, Surgical History, Social History, Family History, Problem List, Medications, and Allergies have been reviewed and updated if relevant.  Patient Active Problem List   Diagnosis Date Noted  . Health care maintenance 09/11/2018  . Ear clicks, unspecified laterality 09/11/2018  . Edema 06/11/2018  . Paresthesia 06/11/2018  . Vitamin D deficiency 05/03/2017  . Bunion 05/03/2017  . Rash and  nonspecific skin eruption 06/05/2016  . History of nonmelanoma skin cancer 03/11/2015  . Dysuria 12/07/2014  . Anxiety state 10/31/2014  . Advance care planning 10/31/2014  . Iliotibial band syndrome 02/11/2014  . Hyperglycemia 09/17/2013  . OA (osteoarthritis) 12/04/2012  . 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 History of bone density study 06/12/2012  . Medicare annual wellness visit, subsequent 05/26/2012  . GERD 04/18/2009  . ABDOMINAL PAIN-EPIGASTRIC 04/18/2009  . URINARY TRACT INFECTION, RECURRENT 03/26/2007  . Fibromyalgia syndrome 03/26/2007    Past Medical History:  Diagnosis Date  . Anxiety   . Arthritis   . Breast cancer (HCC) 2001   s/p rady and lumpectomy  . Carcinoma in situ of breast 2001  . Cataract 1988, 1990   [facility]  . Esophageal reflux   . Fatty liver   . Fibromyalgia   . Hx of adenomatous colonic polyps   . Labyrinthitis, unspecified   . Pure hypercholesterolemia   . Unspecified essential hypertension   . Urinary tract infection, site not specified     Past Surgical History:  Procedure Laterality Date  . BREAST BIOPSY  07/2000   positive right side, [facility]   . BREAST LUMPECTOMY  08/20/2000   right breast, malignant stage I, [facility]  . CATARACT EXTRACTION  1988, 1990  . FOOT SURGERY  1980   minor     Social History   Socioeconomic History  . Marital status: Married  Spouse name: Not on file  . Number of children: Not on file  . Years of education: Not on file  . Highest education level: Not on file  Occupational History  . Occupation: Housewife  Social Needs  . Financial resource strain: Not on file  . Food insecurity:    Worry: Not on file    Inability: Not on file  . Transportation needs:    Medical: Not on file    Non-medical: Not on file  Tobacco Use  . Smoking status: Never Smoker  . Smokeless tobacco: Never Used  Substance and Sexual Activity  . Alcohol use: No    Alcohol/week: 0.0 standard drinks  . Drug use: No    . Sexual activity: Not Currently  Lifestyle  . Physical activity:    Days per week: Not on file    Minutes per session: Not on file  . Stress: Not on file  Relationships  . Social connections:    Talks on phone: Not on file    Gets together: Not on file    Attends religious service: Not on file    Active member of club or organization: Not on file    Attends meetings of clubs or organizations: Not on file    Relationship status: Not on file  . Intimate partner violence:    Fear of current or ex partner: Not on file    Emotionally abused: Not on file    Physically abused: Not on file    Forced sexual activity: Not on file  Other Topics Concern  . Not on file  Social History Narrative   Patient does not get regular exercise   Daily caffeine-one per day   Married 1958   Retired from [employer]    Family History  Problem Relation Age of Onset  . Prostate cancer Brother   . Heart disease Brother   . Pancreatitis Brother        x 2  . Prostate cancer Brother   . Alzheimer's disease Father   . Dementia Father   . Parkinsonism Mother   . Heart disease Brother   . Heart disease Brother   . Breast cancer Paternal Aunt   . Colon cancer Neg Hx     Allergies  Allergen Reactions  . Lipitor [Atorvastatin]     intolerant  . Lisinopril Other (See Comments)    Intolerant, abd pain  . Metoprolol Succinate     REACTION: Exhaustion, increased dizziness  . Nitrofurantoin     REACTION: Rash  . Sulfadiazine     REACTION: Rash  . Yellow Jacket Venom [Bee Venom]     Short of breath    Medication list reviewed and updated in full in [facility].  GEN: No fevers, chills. Nontoxic. Primarily MSK c/o today. MSK: Detailed in the HPI GI: tolerating PO intake without difficulty Neuro: No numbness, parasthesias, or tingling associated. Otherwise the pertinent positives of the ROS are noted above.   Objective:   BP (!) 144/84   Pulse 71   Temp (!) 97.4 F (36.3 C) (Oral)    Ht 5' 3.5" (1.613 m)   Wt 203 lb 4 oz (92.2 kg)   BMI 35.44 kg/m    GEN: WDWN, NAD, Non-toxic, Alert & Oriented x 3 HEENT: Atraumatic, Normocephalic.  Ears and Nose: No external deformity. EXTR: No clubbing/cyanosis/edema NEURO: Normal gait.  PSYCH: Normally interactive. Conversant. Not depressed or anxious appearing.  Calm demeanor.   Knee:  b Gait: Normal heel  toe pattern ROM: Both sides lack approximately 3 degrees of extension and on the right she is able to flex to approximately 90.  On the left she is able to flex to approximately 97 degrees. Effusion: mild on R and minimal on L Echymosis or edema: none Patellar tendon NT Painful PLICA: neg Patellar grind: negative Medial and lateral patellar facet loading: negative medial and lateral joint lines: B R and L Flexion-pinch pos Varus and valgus stress: stable Lachman: neg Ant and Post drawer: neg Hip abduction, IR, ER: WNL Hip flexion str: 5/5 Hip abd: 5/5 Quad: 5/5 VMO atrophy:No Hamstring concentric and eccentric: 5/5   Radiology: No results found.  Assessment and Plan:   Arthritis of both knees - Plan: DG Knee 4 Views W/Patella Left, DG Knee 4 Views W/Patella Right, methylPREDNISolone acetate (DEPO-MEDROL) injection 80 mg, methylPREDNISolone acetate (DEPO-MEDROL) injection 80 mg  Fibromyalgia syndrome  Grief reaction  Leg swelling  >25 minutes spent in face to face time with patient, >50% spent in counselling or coordination of care: Additional time spent in counseling, grief counseling.  She also had multiple other medical questions and required redirection.  I did my best to answer her questions, but ultimately I think that this needs to be handled by her primary care doctor.  I reviewed venous insufficiency with her again.  She essentially has end-stage osteoarthritis of both knees.  She is not interested in joint replacement.  I saw her last 5 months ago, and she has had good responses to prior aspirations  and injections with steroids.  Today for symptomatic management ongoing aspirate her right knee and inject her left knee without aspiration.  Knee Aspiration and Injection, R Date of procedure: 12/01/2018 Patient verbally consented; risks, benefits, and alternatives explained including possible infection. Patient prepped with Chloraprep. Ethyl chloride for anesthesia. 10 cc of 1% Lidocaine used in wheal then injected Subcutaneous fashion with 22 gauge needle on lateral approach. Under sterilne conditions, 18 gauge needle used via lateral approach to aspirate 15 cc of serosanguinous fluid. Then 8 cc of Lidocaine 1% and 2 mL of Depo-Medrol 40 mg injected. Tolerated well, decreased pain, no complications. Medication: 2 mL of Depo-Medrol 40 mg, equaling Depo-Medrol 80 mg total   Knee Injection, L Date of procedure: 12/01/2018 Patient verbally consented to procedure. Risks (including potential rare risk of infection), benefits, and alternatives explained. Sterilely prepped with Chloraprep. Ethyl cholride used for anesthesia. 8 cc Lidocaine 1% mixed with 2 mL Depo-Medrol 40 mg injected using the anteromedial approach without difficulty. No complications with procedure and tolerated well. Patient had decreased pain post-injection. Medication: 2 mL of Depo-Medrol 40 mg, equaling Depo-Medrol 80 mg total   Follow-up: prn only  Meds ordered this encounter  Medications  . methylPREDNISolone acetate (DEPO-MEDROL) injection 80 mg  . methylPREDNISolone acetate (DEPO-MEDROL) injection 80 mg   Orders Placed This Encounter  Procedures  . DG Knee 4 Views W/Patella Left  . DG Knee 4 Views W/Patella Right    Signed,  Karleen Hampshire T. Eduarda Scrivens, MD   Outpatient Encounter Medications as of 12/01/2018  Medication Sig  . acetaminophen (TYLENOL) 325 MG tablet Take 2 tablets (650 mg total) by mouth 3 (three) times daily as needed.  . cholecalciferol (VITAMIN D) 1000 units tablet Take 1 tablet (1,000 Units total) by  mouth daily.  Marland Kitchen LORazepam (ATIVAN) 1 MG tablet Take 1/4-1/2 tablet by mouth every 8 hours as needed  . Multiple Vitamins-Minerals (PRESERVISION AREDS 2 PO) Take by mouth.  . vitamin B-12 (  CYANOCOBALAMIN) 1000 MCG tablet Take 1 tablet (1,000 mcg total) by mouth daily.  . Vitamin D, Ergocalciferol, (DRISDOL) 50000 units CAPS capsule Take 1 capsule (50,000 Units total) by mouth every 7 (seven) days.  Marland Kitchen gabapentin (NEURONTIN) 100 MG capsule TAKE 1-2 CAPSULES (100-200 MG TOTAL) BY MOUTH AT BEDTIME. (Patient not taking: Reported on 09/09/2018)  . [EXPIRED] methylPREDNISolone acetate (DEPO-MEDROL) injection 80 mg   . [EXPIRED] methylPREDNISolone acetate (DEPO-MEDROL) injection 80 mg    No facility-administered encounter medications on file as of 12/01/2018.

## 2018-12-01 ENCOUNTER — Encounter: Payer: Self-pay | Admitting: Family Medicine

## 2018-12-01 ENCOUNTER — Ambulatory Visit (INDEPENDENT_AMBULATORY_CARE_PROVIDER_SITE_OTHER): Payer: PPO | Admitting: Family Medicine

## 2018-12-01 ENCOUNTER — Ambulatory Visit (INDEPENDENT_AMBULATORY_CARE_PROVIDER_SITE_OTHER)
Admission: RE | Admit: 2018-12-01 | Discharge: 2018-12-01 | Disposition: A | Discharge status: Home / Self Care | Payer: PPO | Source: Ambulatory Visit | Attending: Family Medicine | Admitting: Family Medicine

## 2018-12-01 ENCOUNTER — Ambulatory Visit: Payer: PPO | Admitting: Family Medicine

## 2018-12-01 VITALS — BP 144/84 | HR 71 | Temp 97.4°F | Ht 63.5 in | Wt 203.2 lb

## 2018-12-01 DIAGNOSIS — M7989 Other specified soft tissue disorders: Secondary | ICD-10-CM

## 2018-12-01 DIAGNOSIS — M797 Fibromyalgia: Secondary | ICD-10-CM | POA: Diagnosis not present

## 2018-12-01 DIAGNOSIS — M170 Bilateral primary osteoarthritis of knee: Secondary | ICD-10-CM

## 2018-12-01 DIAGNOSIS — F4321 Adjustment disorder with depressed mood: Secondary | ICD-10-CM | POA: Diagnosis not present

## 2018-12-01 MED ORDER — METHYLPREDNISOLONE ACETATE 40 MG/ML IJ SUSP
80.0000 mg | Freq: Once | INTRAMUSCULAR | Status: AC
  Administered 2018-12-01: 80 mg via INTRA_ARTICULAR

## 2018-12-28 ENCOUNTER — Other Ambulatory Visit: Payer: Self-pay | Admitting: Family Medicine

## 2018-12-29 NOTE — Telephone Encounter (Signed)
Electronic refill request. Vitamin D Last office visit:   12/01/2018 [facility] Last Filled:    12 capsule 0 09/09/2018  Please advise.

## 2018-12-30 NOTE — Telephone Encounter (Signed)
Denied.  Needs follow-up vitamin D level first.  Order is in.  Needs lab visit.  Thanks.

## 2018-12-30 NOTE — Telephone Encounter (Signed)
Patient advised. Appointment scheduled.  

## 2019-01-06 ENCOUNTER — Other Ambulatory Visit: Payer: PPO

## 2019-01-06 ENCOUNTER — Telehealth: Payer: Self-pay | Admitting: Family Medicine

## 2019-01-06 NOTE — Telephone Encounter (Signed)
Pt is requesting a copy of 12/01/18 knee x-rays and 09/13/17 hip and spine x-rays on a disc for appt with [facility]. She will pick up Thurs 2/13 at 9am.

## 2019-01-08 DIAGNOSIS — M170 Bilateral primary osteoarthritis of knee: Secondary | ICD-10-CM | POA: Diagnosis not present

## 2019-01-08 DIAGNOSIS — M25561 Pain in right knee: Secondary | ICD-10-CM | POA: Diagnosis not present

## 2019-01-08 DIAGNOSIS — M25562 Pain in left knee: Secondary | ICD-10-CM | POA: Diagnosis not present

## 2019-01-08 DIAGNOSIS — M1711 Unilateral primary osteoarthritis, right knee: Secondary | ICD-10-CM | POA: Diagnosis not present

## 2019-01-08 NOTE — Telephone Encounter (Signed)
Disc is up front in box.

## 2019-01-14 DIAGNOSIS — M25562 Pain in left knee: Secondary | ICD-10-CM | POA: Diagnosis not present

## 2019-01-14 DIAGNOSIS — M25462 Effusion, left knee: Secondary | ICD-10-CM | POA: Diagnosis not present

## 2019-01-14 DIAGNOSIS — M25561 Pain in right knee: Secondary | ICD-10-CM | POA: Diagnosis not present

## 2019-05-11 ENCOUNTER — Telehealth: Payer: Self-pay

## 2019-05-11 ENCOUNTER — Ambulatory Visit (INDEPENDENT_AMBULATORY_CARE_PROVIDER_SITE_OTHER): Payer: PPO | Admitting: Family Medicine

## 2019-05-11 ENCOUNTER — Other Ambulatory Visit: Payer: Self-pay

## 2019-05-11 VITALS — Temp 97.0°F | Ht 63.5 in | Wt 203.5 lb

## 2019-05-11 DIAGNOSIS — M170 Bilateral primary osteoarthritis of knee: Secondary | ICD-10-CM | POA: Diagnosis not present

## 2019-05-11 DIAGNOSIS — M797 Fibromyalgia: Secondary | ICD-10-CM

## 2019-05-11 MED ORDER — METHYLPREDNISOLONE ACETATE 40 MG/ML IJ SUSP
80.0000 mg | Freq: Once | INTRAMUSCULAR | Status: AC
  Administered 2019-05-11: 80 mg via INTRA_ARTICULAR

## 2019-05-11 NOTE — Telephone Encounter (Signed)
Pt is in to see Dr Lorelei Pont today and asked for a refill of Vit D 50,000. Was not sure if she takes this all the time.

## 2019-05-11 NOTE — Progress Notes (Signed)
[name] Allsbrook T. Coy Rochford, MD Primary Care and Sports Medicine [facility] [address] [place], [address] Phone: [phone]  FAX: [phone]  [name] - 78 y.o. female  MRN [id number]  Date of Birth: [birth year]  Visit Date: 05/11/2019  PCP: Joaquim Nam, MD  Referred by: Joaquim Nam, MD  Chief Complaint  Patient presents with  . Knee Pain   Subjective:   [name] is a 78 y.o. very pleasant female patient who presents with the following:  B end-stage OA knees with prior good response to steroid injection, last 6 months prior: She had questions about knee arthritis, and I pulled up her plain films again.  We reviewed these together in the office, and she has end-stage osteoarthritis bilaterally with bone-on-bone pathology.  She continues to have symptoms, some physical disability and limitation.  She had been doing fairly well until couple of weeks ago.  Right now she is reasonably limited, but she is taking care of her husband with end-stage lung disease.  She also has some questions about fibromyalgia.  B knee inj  Past Medical History, Surgical History, Social History, Family History, Problem List, Medications, and Allergies have been reviewed and updated if relevant.  Patient Active Problem List   Diagnosis Date Noted  . Health care maintenance 09/11/2018  . Ear clicks, unspecified laterality 09/11/2018  . Edema 06/11/2018  . Paresthesia 06/11/2018  . Vitamin D deficiency 05/03/2017  . Bunion 05/03/2017  . Rash and nonspecific skin eruption 06/05/2016  . History of nonmelanoma skin cancer 03/11/2015  . Dysuria 12/07/2014  . Anxiety state 10/31/2014  . Advance care planning 10/31/2014  . Iliotibial band syndrome 02/11/2014  . Hyperglycemia 09/17/2013  . OA (osteoarthritis) 12/04/2012  . 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 History of bone density study 06/12/2012  . Medicare annual wellness  visit, subsequent 05/26/2012  . GERD 04/18/2009  . ABDOMINAL PAIN-EPIGASTRIC 04/18/2009  . URINARY TRACT INFECTION, RECURRENT 03/26/2007  . Fibromyalgia syndrome 03/26/2007    Past Medical History:  Diagnosis Date  . Anxiety   . Arthritis   . Breast cancer (HCC) 2001   s/p rady and lumpectomy  . Carcinoma in situ of breast 2001  . Cataract 1988, 1990   [facility]  . Esophageal reflux   . Fatty liver   . Fibromyalgia   . Hx of adenomatous colonic polyps   . Labyrinthitis, unspecified   . Pure hypercholesterolemia   . Unspecified essential hypertension   . Urinary tract infection, site not specified     Past Surgical History:  Procedure Laterality Date  . BREAST BIOPSY  07/2000   positive right side, [facility]   . BREAST LUMPECTOMY  08/20/2000   right breast, malignant stage I, [facility]  . CATARACT EXTRACTION  1988, 1990  . FOOT SURGERY  1980   minor     Social History   Socioeconomic History  . Marital status: Married    Spouse name: Not on file  . Number of children: Not on file  . Years of education: Not on file  . Highest education level: Not on file  Occupational History  . Occupation: Housewife  Social Needs  . Financial resource strain: Not on file  . Food insecurity    Worry: Not on file    Inability: Not on file  . Transportation needs    Medical: Not on file    Non-medical: Not on file  Tobacco Use  .  Smoking status: Never Smoker  . Smokeless tobacco: Never Used  Substance and Sexual Activity  . Alcohol use: No    Alcohol/week: 0.0 standard drinks  . Drug use: No  . Sexual activity: Not Currently  Lifestyle  . Physical activity    Days per week: Not on file    Minutes per session: Not on file  . Stress: Not on file  Relationships  . Social Musician on phone: Not on file    Gets together: Not on file    Attends religious service: Not on file    Active member of club or organization: Not on file    Attends meetings of clubs or  organizations: Not on file    Relationship status: Not on file  . Intimate partner violence    Fear of current or ex partner: Not on file    Emotionally abused: Not on file    Physically abused: Not on file    Forced sexual activity: Not on file  Other Topics Concern  . Not on file  Social History Narrative   Patient does not get regular exercise   Daily caffeine-one per day   Married 1958   Retired from [employer]    Family History  Problem Relation Age of Onset  . Prostate cancer Brother   . Heart disease Brother   . Pancreatitis Brother        x 2  . Prostate cancer Brother   . Alzheimer's disease Father   . Dementia Father   . Parkinsonism Mother   . Heart disease Brother   . Heart disease Brother   . Breast cancer Paternal Aunt   . Colon cancer Neg Hx     Allergies  Allergen Reactions  . Lipitor [Atorvastatin]     intolerant  . Lisinopril Other (See Comments)    Intolerant, abd pain  . Metoprolol Succinate     REACTION: Exhaustion, increased dizziness  . Nitrofurantoin     REACTION: Rash  . Sulfadiazine     REACTION: Rash  . Yellow Jacket Venom [Bee Venom]     Short of breath    Medication list reviewed and updated in full in [facility].  GEN: No fevers, chills. Nontoxic. Primarily MSK c/o today. MSK: Detailed in the HPI GI: tolerating PO intake without difficulty Neuro: No numbness, parasthesias, or tingling associated. Otherwise the pertinent positives of the ROS are noted above.   Objective:   Temp (!) 96.3 F (35.7 C) (Other (Comment))   Ht 5' 3.5" (1.613 m)   Wt 203 lb 8 oz (92.3 kg)   SpO2 99%   BMI 35.48 kg/m    GEN: WDWN, NAD, Non-toxic, Alert & Oriented x 3 HEENT: Atraumatic, Normocephalic.  Ears and Nose: No external deformity. EXTR: No clubbing/cyanosis/edema NEURO: Normal gait.  PSYCH: Normally interactive. Conversant. Not depressed or anxious appearing.  Calm demeanor.    Bilateral knee exam.  Extension lacks approximately  2 degrees.  Flexion to 110 degrees.  Minimal effusion.  Stable to varus and valgus stress.  ACL and PCL are intact.  Bilateral knees with notable medial and lateral joint line tenderness.  Minimal pain with manipulation of the patella.  Bilateral significant crepitus.  Any flexion of the knee causes pain.  Radiology: No results found.  Assessment and Plan:     ICD-10-CM   1. Arthritis of both knees  M17.0 methylPREDNISolone acetate (DEPO-MEDROL) injection 80 mg    methylPREDNISolone acetate (DEPO-MEDROL) injection 80  mg  2. Fibromyalgia syndrome  M79.7    >15 minutes spent in face to face time with patient, >50% spent in counselling or coordination of care: Discussed knee management, arthritis, weight loss.  Physical activity.  Additional time spent in discussing other health conditions including fibromyalgia and edema.  Aspiration/Injection Procedure Note DELOROS TARDIFF 1941-06-04 Date of procedure: 05/11/2019  Procedure: Large Joint Joint Aspiration / Injection of the Right Knee Indications: Pain  Procedure Details Patient verbally consented to procedure. Risks (including potential rare risk of infection), benefits, and alternatives explained. Sterilely prepped with Chloraprep. Ethyl cholride used for anesthesia. 8 cc Lidocaine 1% mixed with 2 mL Depo-Medrol 40 mg injected using the anteromedial approach without difficulty. No complications with procedure and tolerated well. Patient had decreased pain post-injection.  Medication: 2 mL of Depo-Medrol 40 mg, equaling Depo-Medrol 80 mg total  Aspiration/Injection Procedure Note QUINTASHA SCHOENDORF May 31, 1941 Date of procedure: 05/11/2019  Procedure: Large Joint Aspiration / Injection of the Left Knee Indications: Pain  Procedure Details Patient verbally consented to procedure. Risks (including potential rare risk of infection), benefits, and alternatives explained. Sterilely prepped with Chloraprep. Ethyl cholride used for  anesthesia. 8 cc Lidocaine 1% mixed with 2 mL Depo-Medrol 40 mg injected using the anteromedial approach without difficulty. No complications with procedure and tolerated well. Patient had decreased pain post-injection.  Medication: 2 mL of Depo-Medrol 40 mg, equaling Depo-Medrol 80 mg total   Follow-up: prn only  Meds ordered this encounter  Medications  . methylPREDNISolone acetate (DEPO-MEDROL) injection 80 mg  . methylPREDNISolone acetate (DEPO-MEDROL) injection 80 mg   No orders of the defined types were placed in this encounter.   Signed,  Elpidio Galea. Nikolus Marczak, MD   Outpatient Encounter Medications as of 05/11/2019  Medication Sig  . acetaminophen (TYLENOL) 325 MG tablet Take 2 tablets (650 mg total) by mouth 3 (three) times daily as needed.  . cholecalciferol (VITAMIN D) 1000 units tablet Take 1 tablet (1,000 Units total) by mouth daily.  Marland Kitchen LORazepam (ATIVAN) 1 MG tablet Take 1/4-1/2 tablet by mouth every 8 hours as needed  . Multiple Vitamins-Minerals (PRESERVISION AREDS 2 PO) Take by mouth.  . Vitamin D, Ergocalciferol, (DRISDOL) 50000 units CAPS capsule Take 1 capsule (50,000 Units total) by mouth every 7 (seven) days.  Marland Kitchen gabapentin (NEURONTIN) 100 MG capsule TAKE 1-2 CAPSULES (100-200 MG TOTAL) BY MOUTH AT BEDTIME. (Patient not taking: Reported on 09/09/2018)  . vitamin B-12 (CYANOCOBALAMIN) 1000 MCG tablet Take 1 tablet (1,000 mcg total) by mouth daily. (Patient not taking: Reported on 05/11/2019)  . [EXPIRED] methylPREDNISolone acetate (DEPO-MEDROL) injection 80 mg   . [EXPIRED] methylPREDNISolone acetate (DEPO-MEDROL) injection 80 mg    No facility-administered encounter medications on file as of 05/11/2019.

## 2019-05-12 ENCOUNTER — Encounter: Payer: Self-pay | Admitting: Family Medicine

## 2019-05-12 ENCOUNTER — Other Ambulatory Visit: Payer: Self-pay | Admitting: *Deleted

## 2019-05-12 NOTE — Telephone Encounter (Signed)
Refill request sent to Dr. Duncan. 

## 2019-05-12 NOTE — Telephone Encounter (Signed)
Patient was seen in the office by Dr. Lorelei Pont and approached La[place] that she needed a refill of Vitamin D.  Patient wasn't sure if she takes the medication regularly. Last office visit:    (Other than 2 visits with Dr. Lorelei Pont)   09/09/18 with labs Last Filled:    12 capsule 0 09/09/2018  Please advise.

## 2019-05-13 NOTE — Telephone Encounter (Signed)
She was prev advised and given written instructions to take vitamin D once a week and recheck a lab test in about 3 months.  She should come in for lab appointment for Vit D level.  I cancelled the rx.  Thanks.  Order in EMR.

## 2019-05-14 NOTE — Telephone Encounter (Signed)
Patient advised and says she was just in the office a few days ago to see Dr. Lorelei Pont and does not wish to come back in for labs at this time.  Patient was advised that a method has been devised to do labs differently but patient wishes to wait and call back later for an appointment to get Vit D level done.  Patient advised that a refill would not be sent in until this level was obtained.

## 2019-05-16 NOTE — Telephone Encounter (Signed)
Noted. Thanks.

## 2019-07-13 ENCOUNTER — Ambulatory Visit (INDEPENDENT_AMBULATORY_CARE_PROVIDER_SITE_OTHER): Payer: PPO | Admitting: Family Medicine

## 2019-07-13 ENCOUNTER — Other Ambulatory Visit: Payer: Self-pay

## 2019-07-13 ENCOUNTER — Encounter: Payer: Self-pay | Admitting: Family Medicine

## 2019-07-13 VITALS — BP 150/86 | HR 68 | Temp 98.1°F | Ht 63.5 in | Wt 209.5 lb

## 2019-07-13 DIAGNOSIS — R600 Localized edema: Secondary | ICD-10-CM | POA: Diagnosis not present

## 2019-07-13 DIAGNOSIS — M170 Bilateral primary osteoarthritis of knee: Secondary | ICD-10-CM

## 2019-07-13 MED ORDER — METHYLPREDNISOLONE ACETATE 40 MG/ML IJ SUSP
80.0000 mg | Freq: Once | INTRAMUSCULAR | Status: AC
  Administered 2019-07-13: 80 mg via INTRA_ARTICULAR

## 2019-07-13 NOTE — Progress Notes (Signed)
Lorenzo Arscott T. Tinley Rought, MD Primary Care and [place] at [facility] [place], [address] Phone: [phone]  FAX: [phone]  [name] - 78 y.o. female  MRN [id number]  Date of Birth: Dec 14, 1940  Visit Date: 07/13/2019  PCP: Tonia Ghent, MD  Referred by: Tonia Ghent, MD  Chief Complaint  Patient presents with  . Knee Pain    Bilateral  . Foot Swelling   Subjective:   [name] is a 78 y.o. very pleasant female patient with Body mass index is 36.53 kg/m. who presents with the following:  B knee pain: and foot swelling. Bone on bone OA her knees and they feel worse.  She does have known end-stage tricompartmental osteoarthritis of both knees.  At this point she is not ready to get a total knee arthroplasty secondary to COVID-19 and her husband having poor health with advanced COPD.  I have been done some occasional injections with her with corticosteroid with some decent response.  She also is worried about some bilateral lower extremity edema.  This is not a new finding.  B knee injections  Past Medical History, Surgical History, Social History, Family History, Problem List, Medications, and Allergies have been reviewed and updated if relevant.  Patient Active Problem List   Diagnosis Date Noted  . Health care maintenance 09/11/2018  . Ear clicks, unspecified laterality 09/11/2018  . Edema 06/11/2018  . Paresthesia 06/11/2018  . Vitamin D deficiency 05/03/2017  . Bunion 05/03/2017  . Rash and nonspecific skin eruption 06/05/2016  . History of nonmelanoma skin cancer 03/11/2015  . Dysuria 12/07/2014  . Anxiety state 10/31/2014  . Advance care planning 10/31/2014  . Iliotibial band syndrome 02/11/2014  . Hyperglycemia 09/17/2013  . OA (osteoarthritis) 12/04/2012  . 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 History of bone density study 06/12/2012  . Medicare annual wellness visit,  subsequent 05/26/2012  . GERD 04/18/2009  . ABDOMINAL PAIN-EPIGASTRIC 04/18/2009  . URINARY TRACT INFECTION, RECURRENT 03/26/2007  . Fibromyalgia syndrome 03/26/2007    Past Medical History:  Diagnosis Date  . Anxiety   . Arthritis   . Breast cancer ([place]) 2001   s/p rady and lumpectomy  . Carcinoma in situ of breast 2001  . Cataract 1988, [place]  . Esophageal reflux   . Fatty liver   . Fibromyalgia   . Hx of adenomatous colonic polyps   . Labyrinthitis, unspecified   . Pure hypercholesterolemia   . Unspecified essential hypertension   . Urinary tract infection, site not specified     Past Surgical History:  Procedure Laterality Date  . BREAST BIOPSY  07/2000   positive right side, [facility]   . BREAST LUMPECTOMY  08/20/2000   right breast, malignant stage I, [facility]  . CATARACT EXTRACTION  1988, 1990  . FOOT SURGERY  1980   minor     Social History   Socioeconomic History  . Marital status: Married    Spouse name: Not on file  . Number of children: Not on file  . Years of education: Not on file  . Highest education level: Not on file  Occupational History  . Occupation: Housewife  Social Needs  . Financial resource strain: Not on file  . Food insecurity    Worry: Not on file    Inability: Not on file  . Transportation needs    Medical: Not on file    Non-medical: Not  End-stage degenerative joint disease bilateral knees.  I do not think anything short of a total knee arthroplasty will help the patient.  We could also consider Visco supplementation.  Edema, not clear out of potentially help this lady with this.  I will defer to primary care.  Most likely is venous insufficiency in my opinion.  Aspiration/Injection Procedure Note [name] 1941/06/14 Date of procedure: 07/13/2019  Procedure: Large Joint Joint Aspiration / Injection of the Right Knee Indications: Pain  Procedure Details Patient verbally consented to procedure. Risks (including potential rare risk of infection), benefits, and alternatives explained. Sterilely prepped with Chloraprep. Ethyl cholride used for anesthesia. 8 cc Lidocaine 1% mixed with 2 mL Depo-Medrol 40 mg injected using the anteromedial approach without difficulty. No complications with procedure and tolerated well. Patient had decreased pain post-injection.  Medication: 2 mL of Depo-Medrol 40 mg, equaling Depo-Medrol 80 mg total  Aspiration/Injection Procedure Note [name] 09-Mar-1941 Date of procedure: 07/13/2019  Procedure: Large Joint Aspiration / Injection of the Left Knee Indications: Pain  Procedure Details Patient verbally consented to procedure. Risks (including potential rare risk of infection), benefits, and alternatives explained. Sterilely prepped with Chloraprep. Ethyl cholride used for anesthesia. 8 cc Lidocaine 1% mixed with 2 mL Depo-Medrol 40 mg injected using the  anteromedial approach without difficulty. No complications with procedure and tolerated well. Patient had decreased pain post-injection.  Medication: 2 mL of Depo-Medrol 40 mg, equaling Depo-Medrol 80 mg total   Follow-up: No follow-ups on file.  Meds ordered this encounter  Medications  . methylPREDNISolone acetate (DEPO-MEDROL) injection 80 mg  . methylPREDNISolone acetate (DEPO-MEDROL) injection 80 mg   No orders of the defined types were placed in this encounter.   Signed,  Maud Deed. Tinleigh Whitmire, MD   Outpatient Encounter Medications as of 07/13/2019  Medication Sig  . acetaminophen (TYLENOL) 325 MG tablet Take 2 tablets (650 mg total) by mouth 3 (three) times daily as needed.  Marland Kitchen LORazepam (ATIVAN) 1 MG tablet Take 1/4-1/2 tablet by mouth every 8 hours as needed  . Multiple Vitamins-Minerals (PRESERVISION AREDS 2 PO) Take by mouth.  . Vitamin D, Ergocalciferol, (DRISDOL) 50000 units CAPS capsule Take 1 capsule (50,000 Units total) by mouth every 7 (seven) days.  . [DISCONTINUED] cholecalciferol (VITAMIN D) 1000 units tablet Take 1 tablet (1,000 Units total) by mouth daily.  . [DISCONTINUED] gabapentin (NEURONTIN) 100 MG capsule TAKE 1-2 CAPSULES (100-200 MG TOTAL) BY MOUTH AT BEDTIME. (Patient not taking: Reported on 09/09/2018)  . [DISCONTINUED] vitamin B-12 (CYANOCOBALAMIN) 1000 MCG tablet Take 1 tablet (1,000 mcg total) by mouth daily. (Patient not taking: Reported on 05/11/2019)  . [EXPIRED] methylPREDNISolone acetate (DEPO-MEDROL) injection 80 mg   . [EXPIRED] methylPREDNISolone acetate (DEPO-MEDROL) injection 80 mg    No facility-administered encounter medications on file as of 07/13/2019.  Lorenzo Arscott T. Tinley Rought, MD Primary Care and [place] at [facility] [place], [address] Phone: [phone]  FAX: [phone]  [name] - 78 y.o. female  MRN [id number]  Date of Birth: Dec 14, 1940  Visit Date: 07/13/2019  PCP: Tonia Ghent, MD  Referred by: Tonia Ghent, MD  Chief Complaint  Patient presents with  . Knee Pain    Bilateral  . Foot Swelling   Subjective:   [name] is a 78 y.o. very pleasant female patient with Body mass index is 36.53 kg/m. who presents with the following:  B knee pain: and foot swelling. Bone on bone OA her knees and they feel worse.  She does have known end-stage tricompartmental osteoarthritis of both knees.  At this point she is not ready to get a total knee arthroplasty secondary to COVID-19 and her husband having poor health with advanced COPD.  I have been done some occasional injections with her with corticosteroid with some decent response.  She also is worried about some bilateral lower extremity edema.  This is not a new finding.  B knee injections  Past Medical History, Surgical History, Social History, Family History, Problem List, Medications, and Allergies have been reviewed and updated if relevant.  Patient Active Problem List   Diagnosis Date Noted  . Health care maintenance 09/11/2018  . Ear clicks, unspecified laterality 09/11/2018  . Edema 06/11/2018  . Paresthesia 06/11/2018  . Vitamin D deficiency 05/03/2017  . Bunion 05/03/2017  . Rash and nonspecific skin eruption 06/05/2016  . History of nonmelanoma skin cancer 03/11/2015  . Dysuria 12/07/2014  . Anxiety state 10/31/2014  . Advance care planning 10/31/2014  . Iliotibial band syndrome 02/11/2014  . Hyperglycemia 09/17/2013  . OA (osteoarthritis) 12/04/2012  . 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 History of bone density study 06/12/2012  . Medicare annual wellness visit,  subsequent 05/26/2012  . GERD 04/18/2009  . ABDOMINAL PAIN-EPIGASTRIC 04/18/2009  . URINARY TRACT INFECTION, RECURRENT 03/26/2007  . Fibromyalgia syndrome 03/26/2007    Past Medical History:  Diagnosis Date  . Anxiety   . Arthritis   . Breast cancer ([place]) 2001   s/p rady and lumpectomy  . Carcinoma in situ of breast 2001  . Cataract 1988, [place]  . Esophageal reflux   . Fatty liver   . Fibromyalgia   . Hx of adenomatous colonic polyps   . Labyrinthitis, unspecified   . Pure hypercholesterolemia   . Unspecified essential hypertension   . Urinary tract infection, site not specified     Past Surgical History:  Procedure Laterality Date  . BREAST BIOPSY  07/2000   positive right side, [facility]   . BREAST LUMPECTOMY  08/20/2000   right breast, malignant stage I, [facility]  . CATARACT EXTRACTION  1988, 1990  . FOOT SURGERY  1980   minor     Social History   Socioeconomic History  . Marital status: Married    Spouse name: Not on file  . Number of children: Not on file  . Years of education: Not on file  . Highest education level: Not on file  Occupational History  . Occupation: Housewife  Social Needs  . Financial resource strain: Not on file  . Food insecurity    Worry: Not on file    Inability: Not on file  . Transportation needs    Medical: Not on file    Non-medical: Not

## 2019-08-22 ENCOUNTER — Telehealth (INDEPENDENT_AMBULATORY_CARE_PROVIDER_SITE_OTHER): Payer: PPO | Admitting: Family Medicine

## 2019-08-22 DIAGNOSIS — N3000 Acute cystitis without hematuria: Secondary | ICD-10-CM | POA: Diagnosis not present

## 2019-08-22 MED ORDER — CEPHALEXIN 500 MG PO CAPS: 500.0000 mg | ORAL_CAPSULE | Freq: Two times a day (BID) | ORAL | 0 refills | Status: DC

## 2019-08-22 NOTE — Assessment & Plan Note (Signed)
Symptoms concerning for UTI.  We will start her on Keflex.  Advised if she does not start to improve with this she would need to be evaluated in person.  I gave reasons to seek medical attention over the weekend.

## 2019-08-22 NOTE — Telephone Encounter (Signed)
Virtual Visit via telephone Note  This visit type was conducted due to national recommendations for restrictions regarding the COVID-19 pandemic (e.g. social distancing).  This format is felt to be most appropriate for this patient at this time.  All issues noted in this document were discussed and addressed.  No physical exam was performed (except for noted visual exam findings with Video Visits).   I connected with [name] today at by telephone and verified that I am speaking with the correct person using two identifiers. Location patient: home Location provider: work  Persons participating in the virtual visit: patient, provider  I discussed the limitations, risks, security and privacy concerns of performing an evaluation and management service by telephone and the availability of in person appointments. I also discussed with the patient that there may be a patient responsible charge related to this service. The patient expressed understanding and agreed to proceed.  Interactive audio and video telecommunications were attempted between this provider and patient, however failed, due to patient having technical difficulties OR patient did not have access to video capability.  We continued and completed visit with audio only.  Reason for visit: uti  HPI: Call received from on-call service noting that the patient was complaining of UTI symptoms.  She did not want to go be evaluated in person and was requesting to speak to the on-call provider.  I advised that we could complete a virtual visit and the nurse connected me to the patient.  The patient reports a long history of recurrent UTIs in the past.  She notes over the last several days she has had onset of dysuria, urgency, frequency, and some suprapubic discomfort.  No hematuria, vaginal discharge, or fevers.  She notes she has tolerated Keflex in the past for treatment of UTI.   ROS: See pertinent positives and negatives per  HPI.  Past Medical History:  Diagnosis Date  . Anxiety   . Arthritis   . Breast cancer ([place]) 2001   s/p rady and lumpectomy  . Carcinoma in situ of breast 2001  . Cataract 1988, [place]  . Esophageal reflux   . Fatty liver   . Fibromyalgia   . Hx of adenomatous colonic polyps   . Labyrinthitis, unspecified   . Pure hypercholesterolemia   . Unspecified essential hypertension   . Urinary tract infection, site not specified     Past Surgical History:  Procedure Laterality Date  . BREAST BIOPSY  07/2000   positive right side, [facility]   . BREAST LUMPECTOMY  08/20/2000   right breast, malignant stage I, [facility]  . CATARACT EXTRACTION  1988, 1990  . FOOT SURGERY  1980   minor     Family History  Problem Relation Age of Onset  . Prostate cancer Brother   . Heart disease Brother   . Pancreatitis Brother        x 2  . Prostate cancer Brother   . Alzheimer's disease Father   . Dementia Father   . Parkinsonism Mother   . Heart disease Brother   . Heart disease Brother   . Breast cancer Paternal Aunt   . Colon cancer Neg Hx      Current Outpatient Medications:  .  acetaminophen (TYLENOL) 325 MG tablet, Take 2 tablets (650 mg total) by mouth 3 (three) times daily as needed., Disp: , Rfl:  .  cephALEXin (KEFLEX) 500 MG capsule, Take 1 capsule (500 mg total) by mouth 2 (two) times daily., Disp:  14 capsule, Rfl: 0 .  LORazepam (ATIVAN) 1 MG tablet, Take 1/4-1/2 tablet by mouth every 8 hours as needed, Disp: 30 tablet, Rfl: 0 .  Multiple Vitamins-Minerals (PRESERVISION AREDS 2 PO), Take by mouth., Disp: , Rfl:  .  Vitamin D, Ergocalciferol, (DRISDOL) 50000 units CAPS capsule, Take 1 capsule (50,000 Units total) by mouth every 7 (seven) days., Disp: 12 capsule, Rfl: 0  EXAM: This was a telehealth telephone visit and thus no physical exam was completed.  ASSESSMENT AND PLAN:  Discussed the following assessment and plan:  URINARY TRACT INFECTION, RECURRENT Symptoms  concerning for UTI.  We will start her on Keflex.  Advised if she does not start to improve with this she would need to be evaluated in person.  I gave reasons to seek medical attention over the weekend.    I discussed the assessment and treatment plan with the patient. The patient was provided an opportunity to ask questions and all were answered. The patient agreed with the plan and demonstrated an understanding of the instructions.   The patient was advised to call back or seek an in-person evaluation if the symptoms worsen or if the condition fails to improve as anticipated.  I provided 9 minutes of non-face-to-face time during this encounter.   Tommi Rumps, MD

## 2019-08-24 ENCOUNTER — Telehealth: Payer: Self-pay

## 2019-08-24 NOTE — Telephone Encounter (Signed)
Please see 08/22/19 phone note; Dr Caryl Bis did phone visit with pt.

## 2019-08-24 NOTE — Telephone Encounter (Signed)
[facility] Night - Client TELEPHONE ADVICE RECORD [facility] Patient Name: [name] Gender: Female DOB: [birth year] Age: 78 Y 10 M 28 D Return Phone Number: [phone] (Primary), [phone] (Secondary) Address: City/State/Zip: [place] [address] Client [facility] Night - Client Client Site [place] Physician Renford Dills - MD Contact Type Call Who Is Calling Patient / Member / Family / Caregiver Call Type Triage / Clinical Relationship To Patient Self Return Phone Number [phone] (Primary) Chief Complaint Urination Frequency Reason for Call Symptomatic / Request for [place] states she has been experiencing UTI symptoms and would like a medication called in. Translation No Nurse Assessment Nurse: Matthew Folks, RN, [place] Date/Time (Eastern Time): 08/22/2019 12:20:37 PM Confirm and document reason for call. If symptomatic, describe symptoms. ---Caller states she has a bad bladder infection and needs an antibiotic. Pain and burning when she finishes peeing. Frequency. Has the patient had close contact with a person known or suspected to have the novel coronavirus illness OR traveled / lives in area with major community spread (including international travel) in the last 14 days from the onset of symptoms? * If Asymptomatic, screen for exposure and travel within the last 14 days. ---No Does the patient have any new or worsening symptoms? ---Yes Will a triage be completed? ---Yes Related visit to physician within the last 2 weeks? ---No Does the PT have any chronic conditions? (i.e. diabetes, asthma, this includes High risk factors for pregnancy, etc.) ---Yes List chronic conditions. ---narrow bladder neck. Is this a behavioral health or substance abuse call? ---No Guidelines Guideline Title Affirmed Question Affirmed Notes Nurse Date/Time  Eilene Ghazi Time) Urination Pain - Female Age > 32 years Shook-Minyard, RN, [facility] 08/22/2019 12:22:59 PM Disp. Time Eilene Ghazi Time) Disposition Final User 08/22/2019 12:38:28 PM Send To RN Personal Shook-Minyard, RN, [facility] 08/22/2019 1:12:50 PM Called On-Call Provider Shook-Minyard, RN, [place] 08/22/2019 12:37:01 PM See PCP within 24 Hours Yes Shook-Minyard, RN, [place] PLEASE NOTE: All timestamps contained within this report are represented as [place] Standard Time. CONFIDENTIALTY NOTICE: This fax transmission is intended only for the addressee. It contains information that is legally privileged, confidential or otherwise protected from use or disclosure. If you are not the intended recipient, you are strictly prohibited from reviewing, disclosing, copying using or disseminating any of this information or taking any action in reliance on or regarding this information. If you have received this fax in error, please notify us immediately by telephone so that we can arrange for its return to [place]. Phone: [phone], Toll-Free: [phone], Fax: [phone] Page: 2 of 2 Call Id: [id number] [place] Disagree/Comply Comply Caller Understands Yes PreDisposition InappropriateToAsk Care Advice Given Per Guideline SEE PCP WITHIN 24 HOURS: REASSURANCE AND EDUCATION: This could be an urinary tract infection. You should see your PCP to be examined and tested. CARE ADVICE given per Urination Pain - Female (Adult) guideline. * You become worse. CALL BACK IF: Comments User: Janece Canterbury, RN Date/Time (Eastern Time): 08/22/2019 12:24:04 PM she thinks her back pain comes from the way she walks. User: Janece Canterbury, RN Date/Time Eilene Ghazi Time): 08/22/2019 12:27:35 PM Husband has copd and diminished lung capacity. She is worried about this and going anywhere. User: Janece Canterbury, RN Date/Time Eilene Ghazi Time): 08/22/2019 12:29:19 PM [facility] [place] No  antibiotic allergy. User: Janece Canterbury, RN Date/Time Eilene Ghazi Time): 08/22/2019 12:37:51 PM pt understands the outcome of triage but insists on me calling the doc because of her  husbands copd and lung issues. Can't do this for a while as there is no oncall till 1p. User: Janece Canterbury, RN Date/Time Eilene Ghazi Time): 08/22/2019 1:14:44 PM Conferenced pt with Dr. Caryl Bis so he could do a phone visit and decide from there to keep triage outcome or change it. Referrals REFERRED TO PCP OFFICE Paging DoctorName Phone DateTime Result/Outcome Message Type Notes Tommi Rumps - MD [id number] 08/22/2019 1:12:50 PM Called On Call Provider - Reached Doctor Paged Tommi Rumps - MD 08/22/2019 1:14:10 PM Spoke with On Call - General Message Result Explained to Dr. Caryl Bis that patient has husband with lung issues and doesn't want to go anywhere, my outcome was to be seen in 24h. She has UTI sx and has been treated before, this is not a new condition. He wants to do a phone visit with her. Conferenced him together with patient and he took it from there.

## 2019-08-24 NOTE — Telephone Encounter (Signed)
Noted. Thanks.

## 2019-09-07 ENCOUNTER — Telehealth: Payer: Self-pay | Admitting: Family Medicine

## 2019-09-07 NOTE — Telephone Encounter (Signed)
Called pt and she she wanted to schedule for Spring she will have AWV + Labs 03/01/2020 and annual with Dr. Damita Dunnings on 03/08/2020.  Tonia Ghent, MD  [place] annual exam back to spring 2021 if she prefers but please document in chart since we are offering virtual visits.    Thanks.   Brigitte Pulse   Previous Messages  ----- Message -----  From: Tonia Ghent, MD  Sent: 09/04/2019  4:34 PM EDT  To: Tonia Ghent, MD  Subject: how far to push out annual exam? talk to Po*

## 2019-09-11 ENCOUNTER — Ambulatory Visit: Payer: PPO

## 2019-09-18 ENCOUNTER — Ambulatory Visit: Payer: PPO | Admitting: Family Medicine

## 2019-10-12 ENCOUNTER — Telehealth: Payer: Self-pay | Admitting: Family Medicine

## 2019-10-12 NOTE — Telephone Encounter (Signed)
She needs a visit of some variety and a documented u/a.  She could do u/a at a lab visit and then have virtual vs phone f/u.  I need to know if she has already been taking antibiotics.  Please triage patient.  Thanks.

## 2019-10-12 NOTE — Telephone Encounter (Signed)
I spoke with pt and she has burning and pain when urinates and frequency of urine. cystex not helping. Pt scheduled for virtual appt and bring u/a in for testing prior to appt.on 10/13/19 By Loma Sousa

## 2019-10-12 NOTE — Telephone Encounter (Signed)
Patient stated that she has a bladder infection because she has had so many she stated she knows that is what it is. Patient does not want to come into the office for appointment but would like to see if you could call in a prescription for her?   Please advise  Patient is requesting a call back

## 2019-10-13 ENCOUNTER — Other Ambulatory Visit: Payer: PPO

## 2019-10-13 ENCOUNTER — Ambulatory Visit (INDEPENDENT_AMBULATORY_CARE_PROVIDER_SITE_OTHER): Payer: PPO | Admitting: Family Medicine

## 2019-10-13 ENCOUNTER — Encounter: Payer: Self-pay | Admitting: Family Medicine

## 2019-10-13 VITALS — HR 71 | Temp 96.1°F | Resp 99

## 2019-10-13 DIAGNOSIS — R3915 Urgency of urination: Secondary | ICD-10-CM

## 2019-10-13 LAB — POCT URINALYSIS DIPSTICK
Bilirubin, UA: NEGATIVE
Blood, UA: NEGATIVE
Glucose, UA: NEGATIVE
Ketones, UA: NEGATIVE
Leukocytes, UA: NEGATIVE
Nitrite, UA: NEGATIVE
Protein, UA: NEGATIVE
Spec Grav, UA: 1.025 (ref 1.010–1.025)
Urobilinogen, UA: 0.2 E.U./dL
pH, UA: 6 (ref 5.0–8.0)

## 2019-10-13 NOTE — Telephone Encounter (Signed)
See note from today. UA negative will f/u UCx

## 2019-10-13 NOTE — Telephone Encounter (Signed)
Patient advised and verbalized understanding 

## 2019-10-13 NOTE — Progress Notes (Signed)
Virtual Visit via Telephone Note  I connected with [name] on 10/13/19 at 12:20 PM EST by telephone and verified that I am speaking with the correct person using two identifiers.   I discussed the limitations, risks, security and privacy concerns of performing an evaluation and management service by telephone and the availability of in person appointments. I also discussed with the patient that there may be a patient responsible charge related to this service. The patient expressed understanding and agreed to proceed.  Patient location: Home Provider Location: [facility] Participants: Lesleigh Noe and Clyda Hurdle   History of Present Illness: Chief Complaint  Patient presents with  . Urinary Urgency    frequency, burning with urination. Sx x 3 weeks. 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 Negative for vomiting.  Genitourinary: Positive for dysuria, frequency and urgency. Negative for flank pain and hematuria.  Musculoskeletal: Positive for back pain.   09/09/2018:     Observations/Objective: Pulse 71 Comment: per patient  Temp (!) 96.1 F (35.6 C) Comment: per patient  Resp (!) 99 Comment: per patient  Phone visit:  Patient speaking in complete sentences No distress Alert and oriented Insistent that she has a UTI   UA: normal - neg LE, neg nitrites  Prior labs:  09/05/2018: negative UA and UCx Prior urine cultures that were positive were <50000  units  Assessment and Plan: Problem List Items Addressed This Visit    None    Visit Diagnoses    Urinary urgency    -  Primary   Relevant Orders   POCT Urinalysis Dipstick (Completed)   Urine Culture     Lengthy discussion with patient that her UA did not show signs of infection and previously when this was the case she was not prescribed Abx. Will send for culture for definitive answer and treat if positive.   Offered Urology referral - given recurrent symptoms w/o evidence of UTI but pt declined at this time.   Also reviewed if the medication she is taking could cause false negatives and could not find evidence as such. Advised in the future that she come to the office earlier and not take medication until after a UA is obtained.   Follow Up Instructions:  Return if symptoms worsen or fail to improve.   I discussed the assessment and treatment plan with the patient. The patient was provided an opportunity to ask questions and all were answered. The patient agreed with the plan and demonstrated an understanding of the instructions.   The patient was advised to call back or seek an in-person evaluation if the symptoms worsen or if the condition fails to improve as anticipated.  I provided 21 minutes of non-face-to-face time during this encounter.   Lesleigh Noe, MD

## 2019-10-15 LAB — URINE CULTURE
MICRO NUMBER:: 1109612
SPECIMEN QUALITY:: ADEQUATE

## 2019-10-30 ENCOUNTER — Ambulatory Visit (INDEPENDENT_AMBULATORY_CARE_PROVIDER_SITE_OTHER): Payer: PPO | Admitting: Family Medicine

## 2019-10-30 ENCOUNTER — Other Ambulatory Visit: Payer: Self-pay

## 2019-10-30 ENCOUNTER — Encounter: Payer: Self-pay | Admitting: Family Medicine

## 2019-10-30 VITALS — BP 140/82 | HR 76 | Temp 98.0°F | Ht 63.5 in | Wt 209.8 lb

## 2019-10-30 DIAGNOSIS — M170 Bilateral primary osteoarthritis of knee: Secondary | ICD-10-CM

## 2019-10-30 MED ORDER — METHYLPREDNISOLONE ACETATE 40 MG/ML IJ SUSP
80.0000 mg | Freq: Once | INTRAMUSCULAR | Status: AC
  Administered 2019-10-30: 80 mg via INTRA_ARTICULAR

## 2019-10-30 NOTE — Progress Notes (Signed)
Jameya Pontiff T. Mishti Swanton, MD Primary Care and Sports Medicine [facility] at [facility] [address] [place], [address] Phone: [phone]  FAX: [phone]  [name] - 78 y.o. female  MRN [id number]  Date of Birth: [birth year]  Visit Date: 10/30/2019  PCP: Joaquim Nam, MD  Referred by: Joaquim Nam, MD  Chief Complaint  Patient presents with  . Knee Pain    Bilateral injections    This visit occurred during the SARS-CoV-2 public health emergency.  Safety protocols were in place, including screening questions prior to the visit, additional usage of staff PPE, and extensive cleaning of exam room while observing appropriate contact time as indicated for disinfecting solutions.    Procedure visit only. Patient also had some basic questions regarding lower extremity edema, recommended sports compression socks.  She also question whether or not she should be using a diuretic, and ultimately I defer this to her primary care doctor.  Tylenol nsaids Particularly Tylenol is certainly reasonable.  Aspiration/Injection Procedure Note BABBIE PHANN Jul 06, 1941 Date of procedure: 10/30/2019  Procedure: Large Joint Joint Aspiration / Injection of the Right Knee Indications: Pain  Procedure Details Patient verbally consented to procedure. Risks (including potential rare risk of infection), benefits, and alternatives explained. Sterilely prepped with Chloraprep. Ethyl cholride used for anesthesia. 8 cc Lidocaine 1% mixed with 2 mL Depo-Medrol 40 mg injected using the anteromedial approach without difficulty. No complications with procedure and tolerated well. Patient had decreased pain post-injection.  Medication: 2 mL of Depo-Medrol 40 mg, equaling Depo-Medrol 80 mg total  Aspiration/Injection Procedure Note ZLATA NGO 06-06-1941 Date of procedure: 10/30/2019  Procedure: Large Joint Aspiration / Injection of the Left Knee  Indications: Pain  Procedure Details Patient verbally consented to procedure. Risks (including potential rare risk of infection), benefits, and alternatives explained. Sterilely prepped with Chloraprep. Ethyl cholride used for anesthesia. 8 cc Lidocaine 1% mixed with 2 mL Depo-Medrol 40 mg injected using the anteromedial approach without difficulty. No complications with procedure and tolerated well. Patient had decreased pain post-injection.  Medication: 2 mL of Depo-Medrol 40 mg, equaling Depo-Medrol 80 mg total  Signed,  Ignatz Deis T. Tarnisha Kachmar, MD

## 2020-01-27 ENCOUNTER — Other Ambulatory Visit: Payer: Self-pay

## 2020-01-27 ENCOUNTER — Encounter: Payer: Self-pay | Admitting: Family Medicine

## 2020-01-27 ENCOUNTER — Ambulatory Visit (INDEPENDENT_AMBULATORY_CARE_PROVIDER_SITE_OTHER): Payer: PPO | Admitting: Family Medicine

## 2020-01-27 VITALS — BP 170/80 | HR 66 | Temp 98.2°F | Ht 63.5 in | Wt 208.5 lb

## 2020-01-27 DIAGNOSIS — M797 Fibromyalgia: Secondary | ICD-10-CM | POA: Diagnosis not present

## 2020-01-27 DIAGNOSIS — G8929 Other chronic pain: Secondary | ICD-10-CM

## 2020-01-27 DIAGNOSIS — R600 Localized edema: Secondary | ICD-10-CM

## 2020-01-27 DIAGNOSIS — R300 Dysuria: Secondary | ICD-10-CM | POA: Diagnosis not present

## 2020-01-27 DIAGNOSIS — M549 Dorsalgia, unspecified: Secondary | ICD-10-CM | POA: Diagnosis not present

## 2020-01-27 DIAGNOSIS — M170 Bilateral primary osteoarthritis of knee: Secondary | ICD-10-CM | POA: Diagnosis not present

## 2020-01-27 LAB — POC URINALSYSI DIPSTICK (AUTOMATED)
Bilirubin, UA: NEGATIVE
Blood, UA: NEGATIVE
Glucose, UA: NEGATIVE
Ketones, UA: NEGATIVE
Leukocytes, UA: NEGATIVE
Nitrite, UA: NEGATIVE
Protein, UA: NEGATIVE
Spec Grav, UA: >=1.03 — AB (ref 1.010–1.025)
Urobilinogen, UA: 0.2 E.U./dL
pH, UA: 6 (ref 5.0–8.0)

## 2020-01-27 MED ORDER — METHYLPREDNISOLONE ACETATE 40 MG/ML IJ SUSP
80.0000 mg | Freq: Once | INTRAMUSCULAR | Status: AC
  Administered 2020-01-27: 80 mg via INTRA_ARTICULAR

## 2020-01-27 NOTE — Progress Notes (Signed)
[name] Masso T. Jennesis Ramaswamy, MD Primary Care and Sports Medicine [facility] at [facility] [address] [place], [address] Phone: [phone]  FAX: [phone]  [name] - 79 y.o. female  MRN [id number]  Date of Birth: [birth year]  Visit Date: 01/27/2020  PCP: Joaquim Nam, MD  Referred by: Joaquim Nam, MD  Chief Complaint  Patient presents with  . Knee Pain    Bilateral  . Back Pain  . Dysuria    This visit occurred during the SARS-CoV-2 public health emergency.  Safety protocols were in place, including screening questions prior to the visit, additional usage of staff PPE, and extensive cleaning of exam room while observing appropriate contact time as indicated for disinfecting solutions.   Subjective:   [name] is a 79 y.o. very pleasant female patient who presents with the following:  B knee OA.  The patient comes in with initial evaluation and ongoing management of her bilateral knee osteoarthritis.  She continues to be plagued with multijoint symptoms, back pain knee pain, and generally having pain in most of her body.  She does carry a diagnosis of fibromyalgia.  Bilateral knee pain.  She is having difficulty rising from a seated position and she has pain medially, lateral and adjacent to the patella patellofemoral joint.  She also occasionally will have some mild to moderate effusion.  She is not having any giving way.  She is not having any known mechanical symptoms aside from crepitus.  She also complains of multiyear back pain.  She is having pain some in her legs, but this is diffuse, and it does not describe any particular numbness or tingling tingling sensation.  She also has some dysuria and concerned that she could possibly have a urinary tract infection.  She also has been taking some Cystex as well.  Fibromyalgia. Back pain.  UTI? Has used some Cystex.  Legs have really been hurting. Discussion about  fibro.  B Knee injection  DIURETIC?  B KNEE INJECTIONS   Review of Systems is noted in the HPI, as appropriate  Objective:   BP (!) 170/80   Pulse 66   Temp 98.2 F (36.8 C) (Temporal)   Ht 5' 3.5" (1.613 m)   Wt 208 lb 8 oz (94.6 kg)   SpO2 98%   BMI 36.35 kg/m   GEN: No acute distress; alert,appropriate. PULM: Breathing comfortably in no respiratory distress PSYCH: Normally interactive.   Bilateral knee exam: Mild effusion.  Lacks 3 degrees of extension bilaterally.  Flexion to 100 degrees.  Notable crepitus.  Pain along the medial joint line, lateral joint line and with patellar compression.  Pain with full extension as well as flexion.  Essentially all movements at the knee cause pain.  She does have some mild quadricep wasting.  She has tenderness with palpation in the lower extremity throughout the thigh as well as the lower legs.  She also has some pain with palpating the back.  She does have 1+ lower extremity edema.  She does not have any suprapubic tenderness or CVA tenderness  Laboratory and Imaging Data: Results for orders placed or performed in visit on 01/27/20  POCT Urinalysis Dipstick (Automated)  Result Value Ref Range   Color, UA Yellow    Clarity, UA Clear    Glucose, UA Negative Negative   Bilirubin, UA Negative    Ketones, UA Negative    Spec Grav, UA >=1.030 (A) 1.010 - 1.025  Blood, UA Negative    pH, UA 6.0 5.0 - 8.0   Protein, UA Negative Negative   Urobilinogen, UA 0.2 0.2 or 1.0 E.U./dL   Nitrite, UA Negative    Leukocytes, UA Negative Negative     Assessment and Plan:     ICD-10-CM   1. Primary osteoarthritis of knees, bilateral  M17.0 methylPREDNISolone acetate (DEPO-MEDROL) injection 80 mg    methylPREDNISolone acetate (DEPO-MEDROL) injection 80 mg  2. Dysuria  R30.0 POCT Urinalysis Dipstick (Automated)    Urine Culture  3. Fibromyalgia syndrome  M79.7   4. Bilateral lower extremity edema  R60.0   5. Chronic back pain greater  than 3 months duration  M54.9    G89.29    Total encounter time: 40 minutes. On the day of the patient encounter, this can include review of prior records, labs, and imaging.  Additional time can include counselling, consultation with peer MD in person or by telephone.  This also includes independent review of Radiology.  Primary concern and reason for evaluation today was ongoing management of her bilateral osteoarthritis.  We will do some corticosteroid injections today.  I discussed with her viscosupplementation, and I think that this was a reasonable idea, but she has never had this before.  For now she will think about this.  We had extensive conversations about fibromyalgia and chronic pain syndrome as well as myofascial pain.  I did my best to explain multiple avenues that she could try for management and pain management including physical activity, activity in a nonweightbearing location such as in a pool.  Weight loss.  I think that her fibromyalgia is quite significant for her multiple pain complaints including her back pain without radicular symptoms.  She also notes that she has had bilateral lower edema, and she is noted this to me as well as her primary care doctor multiple times in the past.  She asked me for a diuretic.  I do not feel comfortable doing this, so I will pass this question along to her primary care doctor.  She also has some dysuria with a normal UA, but she is on Cystex.  We will get a urine culture.  Follow-up on this.  Patient Instructions  OSTEOARTHRITIS:  For symptomatic relief:  Tylenol: 2 tablets up to 3-4 times a day  Topical Capzaicin Cream, as needed (wear glove to put on) - THIS IS EXCEPTIONALLY HOT  Topical Diclofenac gel up to 4 times a day.  Over the Counter  For flares, corticosteroid injections help. Hyaluronic Acid injections have good success, average relief is 6 months  Ice joints on bad days, 20 min, 2-3 x / day REGULAR EXERCISE:  swimming, Yoga, Tai Chi, bicycle (NON-IMPACT activity)   Weight loss will always take stress off of the joints and back     Aspiration/Injection Procedure Note TISHAWNA KIMBRELL [birth year] Date of procedure: 01/27/2020  Procedure: Large Joint Joint Aspiration / Injection of the Right Knee Indications: Pain  Procedure Details Patient verbally consented to procedure. Risks (including potential rare risk of infection), benefits, and alternatives explained. Sterilely prepped with Chloraprep. Ethyl cholride used for anesthesia. 8 cc Lidocaine 1% mixed with 2 mL Depo-Medrol 40 mg injected using the anteromedial approach without difficulty. No complications with procedure and tolerated well. Patient had decreased pain post-injection.  Medication: 2 mL of Depo-Medrol 40 mg, equaling Depo-Medrol 80 mg total  Aspiration/Injection Procedure Note DAIZIE JOURNIGAN Jan 18, 1941 Date of procedure: 01/27/2020  Procedure: Large Joint Aspiration / Injection of  the Left Knee Indications: Pain  Procedure Details Patient verbally consented to procedure. Risks (including potential rare risk of infection), benefits, and alternatives explained. Sterilely prepped with Chloraprep. Ethyl cholride used for anesthesia. 8 cc Lidocaine 1% mixed with 2 mL Depo-Medrol 40 mg injected using the anteromedial approach without difficulty. No complications with procedure and tolerated well. Patient had decreased pain post-injection.  Medication: 2 mL of Depo-Medrol 40 mg, equaling Depo-Medrol 80 mg total  Follow-up: No follow-ups on file.  Meds ordered this encounter  Medications  . methylPREDNISolone acetate (DEPO-MEDROL) injection 80 mg  . methylPREDNISolone acetate (DEPO-MEDROL) injection 80 mg   There are no discontinued medications. Orders Placed This Encounter  Procedures  . Urine Culture  . POCT Urinalysis Dipstick (Automated)    Signed,  Jeanmarc Viernes T. Jacqualyn Sedgwick, MD   Outpatient Encounter Medications as of  01/27/2020  Medication Sig  . acetaminophen (TYLENOL) 325 MG tablet Take 2 tablets (650 mg total) by mouth 3 (three) times daily as needed.  Marland Kitchen LORazepam (ATIVAN) 1 MG tablet Take 1/4-1/2 tablet by mouth every 8 hours as needed  . Multiple Vitamins-Minerals (PRESERVISION AREDS 2 PO) Take by mouth.  . [EXPIRED] methylPREDNISolone acetate (DEPO-MEDROL) injection 80 mg   . [EXPIRED] methylPREDNISolone acetate (DEPO-MEDROL) injection 80 mg    No facility-administered encounter medications on file as of 01/27/2020.

## 2020-01-27 NOTE — Patient Instructions (Signed)
OSTEOARTHRITIS:  For symptomatic relief:  Tylenol: 2 tablets up to 3-4 times a day  Topical Capzaicin Cream, as needed (wear glove to put on) - THIS IS EXCEPTIONALLY HOT  Topical Diclofenac gel up to 4 times a day.  Over the Counter  For flares, corticosteroid injections help. Hyaluronic Acid injections have good success, average relief is 6 months  Ice joints on bad days, 20 min, 2-3 x / day REGULAR EXERCISE: swimming, Yoga, Tai Chi, bicycle (NON-IMPACT activity)   Weight loss will always take stress off of the joints and back

## 2020-01-28 LAB — URINE CULTURE
MICRO NUMBER:: 10209912
SPECIMEN QUALITY:: ADEQUATE

## 2020-01-29 ENCOUNTER — Telehealth: Payer: Self-pay | Admitting: Family Medicine

## 2020-01-29 NOTE — Telephone Encounter (Signed)
Patient returned Donna's call about her lab results.

## 2020-01-29 NOTE — Telephone Encounter (Signed)
Urine culture results discussed with patient.  See Result note.

## 2020-01-31 ENCOUNTER — Telehealth: Payer: Self-pay | Admitting: Family Medicine

## 2020-01-31 NOTE — Telephone Encounter (Signed)
I saw the note from Dr. Edilia Bo.  I appreciated his input.  Would recommend leg elevation and bilateral compression stockings for lower extremity edema.  I would avoid diuretic use.  Thanks.

## 2020-02-01 NOTE — Telephone Encounter (Signed)
Patient advised. Patient is just wondering why some people get diuretic medication and some do not, she was just wondering and advised patient I would check with Dr Damita Dunnings.  Also, she wanted to mention that when Dr Lorelei Pont gives her knee injections the swelling in her lower legs and feet almost resolves. She is wondering if this is related to each other and that is why it helps? She received 2 injections in 1 knee and 1 injection in the other knee last week she thinks and swelling improved and she went to he bathroom a lot. Did the injection medication help the lower extremity swelling? She is just trying to figure out why the swelling come back. Is it because she may need knee replacement?

## 2020-02-02 NOTE — Telephone Encounter (Signed)
The swelling is likely due to multiple overlapping causes in her case.  If she did have some improvement with the injection then that would argue against putting her on a diuretic.  If she had an indication for diuretic like heart failure then that would be different.  That can be a difference between patients.  Thanks.

## 2020-02-02 NOTE — Telephone Encounter (Signed)
Patient advised.

## 2020-03-01 ENCOUNTER — Other Ambulatory Visit: Payer: PPO

## 2020-03-01 ENCOUNTER — Other Ambulatory Visit: Payer: Self-pay | Admitting: Family Medicine

## 2020-03-01 ENCOUNTER — Ambulatory Visit: Payer: PPO

## 2020-03-01 DIAGNOSIS — E559 Vitamin D deficiency, unspecified: Secondary | ICD-10-CM

## 2020-03-01 DIAGNOSIS — E785 Hyperlipidemia, unspecified: Secondary | ICD-10-CM

## 2020-03-08 ENCOUNTER — Encounter: Payer: PPO | Admitting: Family Medicine

## 2020-03-31 ENCOUNTER — Ambulatory Visit: Payer: PPO | Admitting: Family Medicine

## 2020-04-04 ENCOUNTER — Other Ambulatory Visit: Payer: PPO

## 2020-04-04 ENCOUNTER — Ambulatory Visit: Payer: PPO | Admitting: Family Medicine

## 2020-04-06 ENCOUNTER — Other Ambulatory Visit (INDEPENDENT_AMBULATORY_CARE_PROVIDER_SITE_OTHER): Payer: PPO

## 2020-04-06 ENCOUNTER — Other Ambulatory Visit: Payer: Self-pay

## 2020-04-06 ENCOUNTER — Ambulatory Visit (INDEPENDENT_AMBULATORY_CARE_PROVIDER_SITE_OTHER): Payer: PPO | Admitting: Family Medicine

## 2020-04-06 ENCOUNTER — Encounter: Payer: Self-pay | Admitting: Family Medicine

## 2020-04-06 VITALS — BP 140/90 | HR 69 | Temp 98.0°F | Ht 63.5 in | Wt 214.8 lb

## 2020-04-06 DIAGNOSIS — M170 Bilateral primary osteoarthritis of knee: Secondary | ICD-10-CM | POA: Diagnosis not present

## 2020-04-06 DIAGNOSIS — M797 Fibromyalgia: Secondary | ICD-10-CM | POA: Diagnosis not present

## 2020-04-06 DIAGNOSIS — E785 Hyperlipidemia, unspecified: Secondary | ICD-10-CM | POA: Diagnosis not present

## 2020-04-06 DIAGNOSIS — E559 Vitamin D deficiency, unspecified: Secondary | ICD-10-CM | POA: Diagnosis not present

## 2020-04-06 DIAGNOSIS — M79604 Pain in right leg: Secondary | ICD-10-CM

## 2020-04-06 DIAGNOSIS — R600 Localized edema: Secondary | ICD-10-CM

## 2020-04-06 LAB — LIPID PANEL
Cholesterol: 214 mg/dL — ABNORMAL HIGH (ref 0–200)
HDL: 45.4 mg/dL (ref >39.00)
LDL Cholesterol: 136 mg/dL — ABNORMAL HIGH (ref 0–99)
NonHDL: 168.19
Total CHOL/HDL Ratio: 5
Triglycerides: 162 mg/dL — ABNORMAL HIGH (ref 0.0–149.0)
VLDL: 32.4 mg/dL (ref 0.0–40.0)

## 2020-04-06 LAB — COMPREHENSIVE METABOLIC PANEL
ALT: 13 U/L (ref 0–35)
AST: 15 U/L (ref 0–37)
Albumin: 4.1 g/dL (ref 3.5–5.2)
Alkaline Phosphatase: 119 U/L — ABNORMAL HIGH (ref 39–117)
BUN: 11 mg/dL (ref 6–23)
CO2: 29 mEq/L (ref 19–32)
Calcium: 9.2 mg/dL (ref 8.4–10.5)
Chloride: 106 mEq/L (ref 96–112)
Creatinine, Ser: 0.76 mg/dL (ref 0.40–1.20)
GFR: 73.13 mL/min (ref >60.00)
Glucose, Bld: 114 mg/dL — ABNORMAL HIGH (ref 70–99)
Potassium: 4.8 mEq/L (ref 3.5–5.1)
Sodium: 142 mEq/L (ref 135–145)
Total Bilirubin: 0.7 mg/dL (ref 0.2–1.2)
Total Protein: 6.4 g/dL (ref 6.0–8.3)

## 2020-04-06 LAB — VITAMIN D 25 HYDROXY (VIT D DEFICIENCY, FRACTURES): VITD: 14.12 ng/mL — ABNORMAL LOW (ref 30.00–100.00)

## 2020-04-06 MED ORDER — METHYLPREDNISOLONE ACETATE 40 MG/ML IJ SUSP
80.0000 mg | Freq: Once | INTRAMUSCULAR | Status: AC
  Administered 2020-04-06: 80 mg via INTRA_ARTICULAR

## 2020-04-06 NOTE — Progress Notes (Signed)
Audi Wettstein T. Neziah Vogelgesang, MD, CAQ Sports Medicine  Primary Care and Sports Medicine [facility] at [facility] [address] [place], [address]  Phone: [phone]  FAX: [phone]  [name] - 79 y.o. female  MRN [id number]  Date of Birth: [birth year]  Date: 04/06/2020  PCP: Joaquim Nam, MD  Referral: Joaquim Nam, MD  Chief Complaint  Patient presents with  . Knee Pain    Bilateral    This visit occurred during the SARS-CoV-2 public health emergency.  Safety protocols were in place, including screening questions prior to the visit, additional usage of staff PPE, and extensive cleaning of exam room while observing appropriate contact time as indicated for disinfecting solutions.   Subjective:   [name] is a 79 y.o. very pleasant female patient with Body mass index is 37.44 kg/m. who presents with the following:  B knee pain / OA.  She comes in with a complaint of bilateral knee arthritis, but she also has quite a number of other complaints as well.  She has known bilateral knee osteoarthritis which is severe in character.  She is harboring a hard time managing this, and she also has multiple medical comorbidities including a BMI of 37, fibromyalgia and some chronic pain secondary to this.  She has multiple other pains throughout her body, and she asked me about these and other suggestions for how to manage these symptoms.  She also complains of bilateral leg swelling in the lower extremities, more on the right.  Knees B injection  Visco? B  Declines [place]  Review of Systems is noted in the HPI, as appropriate   Objective:   BP 140/90   Pulse 69   Temp 98 F (36.7 C) (Temporal)   Ht 5' 3.5" (1.613 m)   Wt 214 lb 12 oz (97.4 kg)   SpO2 98%   BMI 37.44 kg/m   GEN: No acute distress; alert,appropriate. PULM: Breathing comfortably in no respiratory distress PSYCH: Normally interactive.    She moves with  difficulty out of the chair and onto the examination table she lacks 5 degrees of extension and she can flex her knees to approximately 95 degrees.  She has tenderness along the medial and lateral joint lines.  She is stable Lachman as well as posterior drawer test.  MCL and LCL are intact.  Any deep flexion causes significant pain.  She does have some 1+ edema in the lower extremities right greater than left.  He also has pain with squeezing of the calf region bilaterally more on the right.  Radiology: No results found.  Assessment and Plan:     ICD-10-CM   1. Primary osteoarthritis of knees, bilateral  M17.0 methylPREDNISolone acetate (DEPO-MEDROL) injection 80 mg    methylPREDNISolone acetate (DEPO-MEDROL) injection 80 mg  2. Right leg pain  M79.604 CANCELED: US Venous Img Lower Unilateral Left (DVT)  3. Edema of right lower leg  R60.0 CANCELED: US Venous Img Lower Unilateral Left (DVT)  4. Fibromyalgia syndrome  M79.7    Total encounter time: 30 minutes. On the day of the patient encounter, this can include review of prior records, labs, and imaging.  Additional time can include counselling, consultation with peer MD in person or by telephone.  This also includes independent review of Radiology.  Fairly large chart reviewed, radiograph review, and discussion with the patient about multiple joint complaints, myofascial pain complaints and back complaints.  Multiple muscular complaints.  I think that  him a large majority of this is secondary to fibromyalgia as well as multiple joint osteoarthritis.  This is a challenge, the patient has brought these things up to me multiple times in the past.  Work on weight loss and basic activity if at all possible.  Nonweightbearing if possible.  She is concerned about possibly having a DVT in the right lower extremity.  She does have some swelling and tenderness.  I suggested that the patient get an ultrasound of her right lower extremity to evaluate for  DVT, but she declined this recommendation.  For now, we will inject her knees today with some steroids hopefully give her some symptomatic relief.  Viscosupplementation is a reasonable idea prior to consideration of surgery.  Aspiration/Injection Procedure Note [name] 07-Apr-1941 Date of procedure: 04/06/2020  Procedure: Large Joint Joint Aspiration / Injection of the Right Knee Indications: Pain  Procedure Details Patient verbally consented to procedure. Risks (including potential rare risk of infection), benefits, and alternatives explained. Sterilely prepped with Chloraprep. Ethyl cholride used for anesthesia. 8 cc Lidocaine 1% mixed with 2 mL Depo-Medrol 40 mg injected using the anteromedial approach without difficulty. No complications with procedure and tolerated well. Patient had decreased pain post-injection.  Medication: 2 mL of Depo-Medrol 40 mg, equaling Depo-Medrol 80 mg total  Aspiration/Injection Procedure Note [name] 1941-07-28 Date of procedure: 04/06/2020  Procedure: Large Joint Aspiration / Injection of the Left Knee Indications: Pain  Procedure Details Patient verbally consented to procedure. Risks (including potential rare risk of infection), benefits, and alternatives explained. Sterilely prepped with Chloraprep. Ethyl cholride used for anesthesia. 8 cc Lidocaine 1% mixed with 2 mL Depo-Medrol 40 mg injected using the anteromedial approach without difficulty. No complications with procedure and tolerated well. Patient had decreased pain post-injection.  Medication: 2 mL of Depo-Medrol 40 mg, equaling Depo-Medrol 80 mg total  Follow-up: No follow-ups on file.  Meds ordered this encounter  Medications  . methylPREDNISolone acetate (DEPO-MEDROL) injection 80 mg  . methylPREDNISolone acetate (DEPO-MEDROL) injection 80 mg   There are no discontinued medications. No orders of the defined types were placed in this  encounter.   Signed,  Elpidio Galea. Ersel Enslin, MD   Outpatient Encounter Medications as of 04/06/2020  Medication Sig  . acetaminophen (TYLENOL) 325 MG tablet Take 2 tablets (650 mg total) by mouth 3 (three) times daily as needed.  Marland Kitchen LORazepam (ATIVAN) 1 MG tablet Take 1/4-1/2 tablet by mouth every 8 hours as needed  . Multiple Vitamins-Minerals (PRESERVISION AREDS 2 PO) Take by mouth.  . [EXPIRED] methylPREDNISolone acetate (DEPO-MEDROL) injection 80 mg   . [EXPIRED] methylPREDNISolone acetate (DEPO-MEDROL) injection 80 mg    No facility-administered encounter medications on file as of 04/06/2020.

## 2020-04-08 ENCOUNTER — Ambulatory Visit: Payer: PPO

## 2020-04-08 ENCOUNTER — Other Ambulatory Visit: Payer: PPO

## 2020-04-08 ENCOUNTER — Telehealth: Payer: Self-pay

## 2020-04-08 NOTE — Telephone Encounter (Signed)
Noted. Thanks.

## 2020-04-08 NOTE — Telephone Encounter (Signed)
FYI- Called patient 4 times trying to complete his medicare visit. Patient never answered the phone. Left HIPAA complaint voicemail notifying patient to call office to reschedule or will be completed at upcoming physical with provider.

## 2020-04-13 ENCOUNTER — Ambulatory Visit (INDEPENDENT_AMBULATORY_CARE_PROVIDER_SITE_OTHER): Payer: PPO

## 2020-04-13 VITALS — Ht 63.5 in | Wt 214.8 lb

## 2020-04-13 DIAGNOSIS — Z0000 Encounter for general adult medical examination without abnormal findings: Secondary | ICD-10-CM | POA: Diagnosis not present

## 2020-04-13 NOTE — Progress Notes (Signed)
PCP notes:  Health Maintenance: No gaps noted   Abnormal Screenings: none   Patient concerns: Bilateral knee pain Spot on right leg- sometimes painful   Nurse concerns: none   Next PCP appt.: 04/15/2020 @ 10:30 am

## 2020-04-13 NOTE — Patient Instructions (Addendum)
[name] , Thank you for taking time to come for your Medicare Wellness Visit. I appreciate your ongoing commitment to your health goals. Please review the following plan we discussed and let me know if I can assist you in the future.   Screening recommendations/referrals: Colonoscopy: declined Mammogram: no longer required Bone Density: completed 06/10/2012 Recommended yearly ophthalmology/optometry visit for glaucoma screening and checkup Recommended yearly dental visit for hygiene and checkup  Vaccinations: Influenza vaccine: Fall 2021 Pneumococcal vaccine: Completed series Tdap vaccine: Up to date, completed 03/15/2011 Shingles vaccine: discussed    Advanced directives: Advance directive discussed with you today. Even though you declined this today please call our office should you change your mind and we can give you the proper paperwork for you to fill out.  Conditions/risks identified: none  Next appointment: 04/15/2020 @ 10:30 am    Preventive Care 65 Years and Older, Female Preventive care refers to lifestyle choices and visits with your health care provider that can promote health and wellness. What does preventive care include?  A yearly physical exam. This is also called an annual well check.  Dental exams once or twice a year.  Routine eye exams. Ask your health care provider how often you should have your eyes checked.  Personal lifestyle choices, including:  Daily care of your teeth and gums.  Regular physical activity.  Eating a healthy diet.  Avoiding tobacco and drug use.  Limiting alcohol use.  Practicing safe sex.  Taking low-dose aspirin every day.  Taking vitamin and mineral supplements as recommended by your health care provider. What happens during an annual well check? The services and screenings done by your health care provider during your annual well check will depend on your age, overall health, lifestyle risk factors, and family history  of disease. Counseling  Your health care provider may ask you questions about your:  Alcohol use.  Tobacco use.  Drug use.  Emotional well-being.  Home and relationship well-being.  Sexual activity.  Eating habits.  History of falls.  Memory and ability to understand (cognition).  Work and work Statistician.  Reproductive health. Screening  You may have the following tests or measurements:  Height, weight, and BMI.  Blood pressure.  Lipid and cholesterol levels. These may be checked every 5 years, or more frequently if you are over 30 years old.  Skin check.  Lung cancer screening. You may have this screening every year starting at age 30 if you have a 30-pack-year history of smoking and currently smoke or have quit within the past 15 years.  Fecal occult blood test (FOBT) of the stool. You may have this test every year starting at age 13.  Flexible sigmoidoscopy or colonoscopy. You may have a sigmoidoscopy every 5 years or a colonoscopy every 10 years starting at age 42.  Hepatitis C blood test.  Hepatitis B blood test.  Sexually transmitted disease (STD) testing.  Diabetes screening. This is done by checking your blood sugar (glucose) after you have not eaten for a while (fasting). You may have this done every 1-3 years.  Bone density scan. This is done to screen for osteoporosis. You may have this done starting at age 43.  Mammogram. This may be done every 1-2 years. Talk to your health care provider about how often you should have regular mammograms. Talk with your health care provider about your test results, treatment options, and if necessary, the need for more tests. Vaccines  Your health care provider may recommend certain  vaccines, such as:  Influenza vaccine. This is recommended every year.  Tetanus, diphtheria, and acellular pertussis (Tdap, Td) vaccine. You may need a Td booster every 10 years.  Zoster vaccine. You may need this after age  59.  Pneumococcal 13-valent conjugate (PCV13) vaccine. One dose is recommended after age 59.  Pneumococcal polysaccharide (PPSV23) vaccine. One dose is recommended after age 21. Talk to your health care provider about which screenings and vaccines you need and how often you need them. This information is not intended to replace advice given to you by your health care provider. Make sure you discuss any questions you have with your health care provider. Document Released: 12/09/2015 Document Revised: 08/01/2016 Document Reviewed: 09/13/2015 Elsevier Interactive Patient Education  2017 [place] Prevention in the Home Falls can cause injuries. They can happen to people of all ages. There are many things you can do to make your home safe and to help prevent falls. What can I do on the outside of my home?  Regularly fix the edges of walkways and driveways and fix any cracks.  Remove anything that might make you trip as you walk through a door, such as a raised step or threshold.  Trim any bushes or trees on the path to your home.  Use bright outdoor lighting.  Clear any walking paths of anything that might make someone trip, such as rocks or tools.  Regularly check to see if handrails are loose or broken. Make sure that both sides of any steps have handrails.  Any raised decks and porches should have guardrails on the edges.  Have any leaves, snow, or ice cleared regularly.  Use sand or salt on walking paths during winter.  Clean up any spills in your garage right away. This includes oil or grease spills. What can I do in the bathroom?  Use night lights.  Install grab bars by the toilet and in the tub and shower. Do not use towel bars as grab bars.  Use non-skid mats or decals in the tub or shower.  If you need to sit down in the shower, use a plastic, non-slip stool.  Keep the floor dry. Clean up any water that spills on the floor as soon as it happens.  Remove  soap buildup in the tub or shower regularly.  Attach bath mats securely with double-sided non-slip rug tape.  Do not have throw rugs and other things on the floor that can make you trip. What can I do in the bedroom?  Use night lights.  Make sure that you have a light by your bed that is easy to reach.  Do not use any sheets or blankets that are too big for your bed. They should not hang down onto the floor.  Have a firm chair that has side arms. You can use this for support while you get dressed.  Do not have throw rugs and other things on the floor that can make you trip. What can I do in the kitchen?  Clean up any spills right away.  Avoid walking on wet floors.  Keep items that you use a lot in easy-to-reach places.  If you need to reach something above you, use a strong step stool that has a grab bar.  Keep electrical cords out of the way.  Do not use floor polish or wax that makes floors slippery. If you must use wax, use non-skid floor wax.  Do not have throw rugs and other things  on the floor that can make you trip. What can I do with my stairs?  Do not leave any items on the stairs.  Make sure that there are handrails on both sides of the stairs and use them. Fix handrails that are broken or loose. Make sure that handrails are as long as the stairways.  Check any carpeting to make sure that it is firmly attached to the stairs. Fix any carpet that is loose or worn.  Avoid having throw rugs at the top or bottom of the stairs. If you do have throw rugs, attach them to the floor with carpet tape.  Make sure that you have a light switch at the top of the stairs and the bottom of the stairs. If you do not have them, ask someone to add them for you. What else can I do to help prevent falls?  Wear shoes that:  Do not have high heels.  Have rubber bottoms.  Are comfortable and fit you well.  Are closed at the toe. Do not wear sandals.  If you use a  stepladder:  Make sure that it is fully opened. Do not climb a closed stepladder.  Make sure that both sides of the stepladder are locked into place.  Ask someone to hold it for you, if possible.  Clearly mark and make sure that you can see:  Any grab bars or handrails.  First and last steps.  Where the edge of each step is.  Use tools that help you move around (mobility aids) if they are needed. These include:  Canes.  Walkers.  Scooters.  Crutches.  Turn on the lights when you go into a dark area. Replace any light bulbs as soon as they burn out.  Set up your furniture so you have a clear path. Avoid moving your furniture around.  If any of your floors are uneven, fix them.  If there are any pets around you, be aware of where they are.  Review your medicines with your doctor. Some medicines can make you feel dizzy. This can increase your chance of falling. Ask your doctor what other things that you can do to help prevent falls. This information is not intended to replace advice given to you by your health care provider. Make sure you discuss any questions you have with your health care provider. Document Released: 09/08/2009 Document Revised: 04/19/2016 Document Reviewed: 12/17/2014 Elsevier Interactive Patient Education  2017 [employer].

## 2020-04-13 NOTE — Progress Notes (Signed)
Subjective:   [name] is a 79 y.o. female who presents for Medicare Annual (Subsequent) preventive examination.  Review of Systems: N/A   I connected with the patient today by telephone and verified that I am speaking with the correct person using two identifiers. Location patient: home Location nurse: work Persons participating in the virtual visit: patient, Marine scientist.   I discussed the limitations, risks, security and privacy concerns of performing an evaluation and management service by telephone and the availability of in person appointments. I also discussed with the patient that there may be a patient responsible charge related to this service. The patient expressed understanding and verbally consented to this telephonic visit.    Interactive audio and video telecommunications were attempted between this nurse and patient, however failed, due to patient having technical difficulties OR patient did not have access to video capability.  We continued and completed visit with audio only.     Cardiac Risk Factors include: advanced age (>19men, >13 women)     Objective:     Vitals: Ht 5' 3.5" (1.613 m)   Wt 214 lb 12 oz (97.4 kg)   BMI 37.44 kg/m   Body mass index is 37.44 kg/m.  Advanced Directives 04/13/2020 09/05/2018 04/26/2017  Does Patient Have a Medical Advance Directive? No No No  Would patient like information on creating a medical advance directive? No - Patient declined No - Patient declined -    Tobacco Social History   Tobacco Use  Smoking Status Never Smoker  Smokeless Tobacco Never Used     Counseling given: Not Answered   Clinical Intake:  Pre-visit preparation completed: Yes  Pain : 0-10 Pain Score: 5  Pain Type: Chronic pain Pain Location: Knee Pain Orientation: Left, Right Pain Descriptors / Indicators: Aching Pain Onset: More than a month ago Pain Frequency: Intermittent     Nutritional Risks: None Diabetes: No  How often do you  need to have someone help you when you read instructions, pamphlets, or other written materials from your doctor or pharmacy?: 1 - Never What is the last grade level you completed in school?: 12th, some college  Interpreter Needed?: No  Information entered by :: CJohnson, LPN  Past Medical History:  Diagnosis Date  . Anxiety   . Arthritis   . Breast cancer ([place]) 2001   s/p rady and lumpectomy  . Carcinoma in situ of breast 2001  . Cataract 1988, [place]  . Esophageal reflux   . Fatty liver   . Fibromyalgia   . Hx of adenomatous colonic polyps   . Labyrinthitis, unspecified   . Pure hypercholesterolemia   . Unspecified essential hypertension   . Urinary tract infection, site not specified    Past Surgical History:  Procedure Laterality Date  . BREAST BIOPSY  07/2000   positive right side, [facility]   . BREAST LUMPECTOMY  08/20/2000   right breast, malignant stage I, [facility]  . CATARACT EXTRACTION  1988, 1990  . FOOT SURGERY  1980   minor    Family History  Problem Relation Age of Onset  . Prostate cancer Brother   . Heart disease Brother   . Pancreatitis Brother        x 2  . Prostate cancer Brother   . Alzheimer's disease Father   . Dementia Father   . Parkinsonism Mother   . Heart disease Brother   . Heart disease Brother   . Breast cancer Paternal Aunt   .  Colon cancer Neg Hx    Social History   Socioeconomic History  . Marital status: Married    Spouse name: Not on file  . Number of children: Not on file  . Years of education: Not on file  . Highest education level: Not on file  Occupational History  . Occupation: Housewife  Tobacco Use  . Smoking status: Never Smoker  . Smokeless tobacco: Never Used  Substance and Sexual Activity  . Alcohol use: No    Alcohol/week: 0.0 standard drinks  . Drug use: No  . Sexual activity: Not Currently  Other Topics Concern  . Not on file  Social History Narrative   Patient does not get regular exercise    Daily caffeine-one per day   Married 1958   Retired from [employer]: [place]   . Difficulty of Paying Living Expenses: Not hard at all  Food Insecurity: No Food Insecurity  . Worried About Charity fundraiser in the Last Year: Never true  . Ran Out of Food in the Last Year: Never true  Transportation Needs: No Transportation Needs  . Lack of Transportation (Medical): No  . Lack of Transportation (Non-Medical): No  Physical Activity: Inactive  . Days of Exercise per Week: 0 days  . Minutes of Exercise per Session: 0 min  Stress: No Stress Concern Present  . Feeling of Stress : Not at all  Social Connections:   . Frequency of Communication with Friends and Family:   . Frequency of Social Gatherings with Friends and Family:   . Attends Religious Services:   . Active Member of Clubs or Organizations:   . Attends Archivist Meetings:   Marland Kitchen Marital Status:     Outpatient Encounter Medications as of 04/13/2020  Medication Sig  . acetaminophen (TYLENOL) 325 MG tablet Take 2 tablets (650 mg total) by mouth 3 (three) times daily as needed.  Marland Kitchen LORazepam (ATIVAN) 1 MG tablet Take 1/4-1/2 tablet by mouth every 8 hours as needed  . Multiple Vitamins-Minerals (PRESERVISION AREDS 2 PO) Take by mouth.   No facility-administered encounter medications on file as of 04/13/2020.    Activities of Daily Living In your present state of health, do you have any difficulty performing the following activities: 04/13/2020  Hearing? N  Vision? N  Difficulty concentrating or making decisions? N  Walking or climbing stairs? N  Dressing or bathing? N  Doing errands, shopping? N  Preparing Food and eating ? N  Using the Toilet? N  In the past six months, have you accidently leaked urine? Y  Comment wears a pad  Do you have problems with loss of bowel control? N  Managing your Medications? N  Managing your Finances? N  Housekeeping or  managing your Housekeeping? N  Some recent data might be hidden    Patient Care Team: Tonia Ghent, MD as PCP - General (Family Medicine)    Assessment:   This is a routine wellness examination for [place].  Exercise Activities and Dietary recommendations Current Exercise Habits: The patient does not participate in regular exercise at present, Exercise limited by: None identified  Goals    . Increase water intake     Starting 09/05/2018, I will attempt to drink at least 6-8 glasses daily.     . Patient Stated     04/13/2020,  I will maintain and continue medications as prescribed.  Fall Risk Fall Risk  04/13/2020 09/05/2018 04/26/2017 02/24/2016 10/28/2014  Falls in the past year? 1 Yes Yes No No  Comment - fell as result of foot getting tangled in a rope; bruising to both knees after falling on rocks pt fell on slippery deck; no injury or medical treatment - -  Number falls in past yr: 0 1 1 - -  Injury with Fall? 0 Yes No - -  Risk for fall due to : Medication side effect;Impaired balance/gait Impaired balance/gait;Impaired mobility - - -  Follow up Falls evaluation completed;Falls prevention discussed - - - -   Is the patient's home free of loose throw rugs in walkways, pet beds, electrical cords, etc?   yes      Grab bars in the bathroom? no      Handrails on the stairs?   yes      Adequate lighting?   yes  Timed Get Up and Go performed: N/A  Depression Screen PHQ 2/9 Scores 04/13/2020 09/05/2018 04/26/2017 02/24/2016  PHQ - 2 Score 0 1 0 0  PHQ- 9 Score 0 1 - -     Cognitive Function MMSE - Mini Mental State Exam 04/13/2020 09/05/2018 04/26/2017  Orientation to time 5 5 5   Orientation to Place 5 5 5   Registration 3 3 3   Attention/ Calculation 5 0 0  Recall 3 3 3   Language- name 2 objects - 0 0  Language- repeat 1 1 1   Language- follow 3 step command - 3 3  Language- read & follow direction - 0 0  Write a sentence - 0 0  Copy design - 0 0  Total score - 20  20  Mini Cog  Mini-Cog screen was completed. Maximum score is 22. A value of 0 denotes this part of the MMSE was not completed or the patient failed this part of the Mini-Cog screening.       Immunization History  Administered Date(s) Administered  . Influenza Whole 10/07/2006, 10/22/2007, 09/20/2009, 09/20/2010  . Influenza,inj,Quad PF,6+ Mos 08/27/2013, 10/28/2014, 11/07/2016, 12/11/2017, 09/05/2018  . Pneumococcal Conjugate-13 02/24/2016  . Pneumococcal Polysaccharide-23 08/23/1999, 05/26/2012  . Td 07/16/2006, 03/15/2011  . Zoster 03/10/2009    Qualifies for Shingles Vaccine: Yes  Screening Tests Health Maintenance  Topic Date Due  . COVID-19 Vaccine (1) Never done  . URINE MICROALBUMIN  03/14/2011  . COLONOSCOPY  04/13/2024 (Originally 05/30/2017)  . INFLUENZA VACCINE  06/26/2020  . TETANUS/TDAP  03/14/2021  . DEXA SCAN  Completed  . PNA vac Low Risk Adult  Completed    Cancer Screenings: Lung: Low Dose CT Chest recommended if Age 55-80 years, 30 pack-year currently smoking OR have quit w/in 15 years. Patient does not qualify. Breast:  Mammogram: no longer required   Bone Density/Dexa: completed 06/10/2012 Colorectal: no longer required  Additional Screenings:  Hepatitis C Screening: N/A     Plan:   Patient will maintain and continue medications as prescribed.   I have personally reviewed and noted the following in the patient's chart:   . Medical and social history . Use of alcohol, tobacco or illicit drugs  . Current medications and supplements . Functional ability and status . Nutritional status . Physical activity . Advanced directives . List of other physicians . Hospitalizations, surgeries, and ER visits in previous 12 months . Vitals . Screenings to include cognitive, depression, and falls . Referrals and appointments  In addition, I have reviewed and discussed with patient certain preventive protocols, quality metrics, and best  practice  recommendations. A written personalized care plan for preventive services as well as general preventive health recommendations were provided to patient.     Andrez Grime, LPN  [id number]

## 2020-04-15 ENCOUNTER — Other Ambulatory Visit: Payer: Self-pay

## 2020-04-15 ENCOUNTER — Telehealth: Payer: Self-pay

## 2020-04-15 ENCOUNTER — Ambulatory Visit (INDEPENDENT_AMBULATORY_CARE_PROVIDER_SITE_OTHER): Payer: PPO | Admitting: Family Medicine

## 2020-04-15 ENCOUNTER — Encounter: Payer: Self-pay | Admitting: Family Medicine

## 2020-04-15 VITALS — BP 140/90 | HR 67 | Temp 97.2°F | Ht 63.5 in | Wt 207.2 lb

## 2020-04-15 DIAGNOSIS — R748 Abnormal levels of other serum enzymes: Secondary | ICD-10-CM

## 2020-04-15 DIAGNOSIS — Z7189 Other specified counseling: Secondary | ICD-10-CM

## 2020-04-15 DIAGNOSIS — E559 Vitamin D deficiency, unspecified: Secondary | ICD-10-CM

## 2020-04-15 DIAGNOSIS — M1990 Unspecified osteoarthritis, unspecified site: Secondary | ICD-10-CM | POA: Diagnosis not present

## 2020-04-15 DIAGNOSIS — H9319 Tinnitus, unspecified ear: Secondary | ICD-10-CM | POA: Diagnosis not present

## 2020-04-15 DIAGNOSIS — Z0000 Encounter for general adult medical examination without abnormal findings: Secondary | ICD-10-CM

## 2020-04-15 MED ORDER — VITAMIN D (ERGOCALCIFEROL) 1.25 MG (50000 UNIT) PO CAPS: 50000.0000 [IU] | ORAL_CAPSULE | ORAL | 0 refills | Status: DC

## 2020-04-15 NOTE — Progress Notes (Signed)
This visit occurred during the SARS-CoV-2 public health emergency.  Safety protocols were in place, including screening questions prior to the visit, additional usage of staff PPE, and extensive cleaning of exam room while observing appropriate contact time as indicated for disinfecting solutions.  She is having more knee pain episodically.  She isn't sleeping well when her legs hurt.  Walking with a cane at baseline.  She has been getting knee injected and that clearly helped.  She wants to avoid surgery if possible.    Low vit D.  Labs d/w pt. discussed with patient about restarting vitamin D and rechecking a level after that.  See orders.  Alk phos mildly elevated.  No FCNAVD.  No jaundice, no abd pain.    She still has some clicking in the L ear.  That happens at baseline, episodically.  Discussed.  No new symptoms.  She had covid vaccine 2021.   Tetanus 2012. zostavax 2010 PNA up to date.  Flu shot prev done.  Defer colon cancer screening.   DXA d/w pt.  Defer 2021 given low vit D.   Mammogram d/w pt.   See avs.  She can call.   Advance directive- husband then daughter designated if patient were incapacitated.  Meds, vitals, and allergies reviewed.   ROS: Per HPI unless specifically indicated in ROS section   GEN: nad, alert and oriented HEENT: ncat, TM wnl B NECK: supple w/o LA CV: rrr. PULM: ctab, no inc wob ABD: soft, +bs EXT: no edema SKIN: no acute rash B calf 39 cm  R hamstring slightly ttp.    At least 30 minutes were devoted to patient care in this encounter (this can potentially include time spent reviewing the patient's file/history, interviewing and examining the patient, counseling/reviewing plan with patient, ordering referrals, ordering tests, reviewing relevant laboratory or x-ray data, and documenting the encounter).

## 2020-04-15 NOTE — Telephone Encounter (Signed)
Pt is approved for bilateral Monovisc. No pre-cert required.  Deductible does not apply & once out of pocket has been met, pt will be covered at 100%.    Will forward to Hedy Camara, [place] to order and follow up with patient.   Thanks!

## 2020-04-15 NOTE — Patient Instructions (Addendum)
You can call for a mammogram at [facility].  [place] for 5 minutes at a time on your knees.  That may help some.   If you want to go see ENT about your ear then let me know.   Take vitamin D weekly and recheck in about 3 months.   Take care.  Glad to see you.

## 2020-04-18 DIAGNOSIS — R748 Abnormal levels of other serum enzymes: Secondary | ICD-10-CM | POA: Insufficient documentation

## 2020-04-18 NOTE — Assessment & Plan Note (Signed)
Low vit D.  Labs d/w pt. discussed with patient about restarting vitamin D and rechecking a level after that.  See orders.

## 2020-04-18 NOTE — Assessment & Plan Note (Signed)
Alk phos mildly elevated.  No FCNAVD.  No jaundice, no abd pain.   No intervention for now.  We can recheck on follow-up labs.  Discussed.  She agrees.

## 2020-04-18 NOTE — Assessment & Plan Note (Signed)
I think it makes sense to continue with episodic injections for now, since she wants to put off surgery and the injections help.  She will update me as needed.  We talked about the fact that any pain related to fibromyalgia would likely not be improved with intervention on her knee otherwise.

## 2020-04-18 NOTE — Assessment & Plan Note (Signed)
Advance directive- husband then daughter designated if patient were incapacitated.

## 2020-04-18 NOTE — Assessment & Plan Note (Signed)
She had covid vaccine 2021.   Tetanus 2012. zostavax 2010 PNA up to date.  Flu shot prev done.  Defer colon cancer screening.   DXA d/w pt.  Defer 2021 given low vit D.   Mammogram d/w pt.   See avs.  She can call.   Advance directive- husband then daughter designated if patient were incapacitated.

## 2020-04-18 NOTE — Assessment & Plan Note (Signed)
Tympanic membranes normal appearing bilaterally.  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  Discussed with patient about referral to ENT if she desired.  She wanted to defer for now.  She will update me as needed.

## 2020-04-19 NOTE — Telephone Encounter (Signed)
[name] notified as instructed by telephone.  She ask what her out of pocket was.  I advise her to call the member services number on the back of her insurance card and they can let her know.  She will call her insurance and call back if she decides she want to do the Monovisc injections.

## 2020-04-28 DIAGNOSIS — J301 Allergic rhinitis due to pollen: Secondary | ICD-10-CM | POA: Diagnosis not present

## 2020-04-28 DIAGNOSIS — H6982 Other specified disorders of Eustachian tube, left ear: Secondary | ICD-10-CM | POA: Diagnosis not present

## 2020-05-23 DIAGNOSIS — H9313 Tinnitus, bilateral: Secondary | ICD-10-CM | POA: Diagnosis not present

## 2020-05-23 DIAGNOSIS — H903 Sensorineural hearing loss, bilateral: Secondary | ICD-10-CM | POA: Diagnosis not present

## 2020-06-01 ENCOUNTER — Encounter: Payer: Self-pay | Admitting: Family Medicine

## 2020-06-01 ENCOUNTER — Ambulatory Visit: Payer: PPO

## 2020-06-01 ENCOUNTER — Other Ambulatory Visit: Payer: Self-pay

## 2020-06-01 ENCOUNTER — Ambulatory Visit (INDEPENDENT_AMBULATORY_CARE_PROVIDER_SITE_OTHER): Payer: PPO | Admitting: Family Medicine

## 2020-06-01 ENCOUNTER — Telehealth: Payer: Self-pay | Admitting: *Deleted

## 2020-06-01 VITALS — BP 140/80 | HR 76 | Temp 98.2°F | Ht 63.5 in | Wt 213.5 lb

## 2020-06-01 DIAGNOSIS — M1711 Unilateral primary osteoarthritis, right knee: Secondary | ICD-10-CM

## 2020-06-01 DIAGNOSIS — M7989 Other specified soft tissue disorders: Secondary | ICD-10-CM

## 2020-06-01 DIAGNOSIS — M79604 Pain in right leg: Secondary | ICD-10-CM

## 2020-06-01 NOTE — Telephone Encounter (Signed)
"  Pt is approved for bilateral Monovisc. No pre-cert required.  Deductible does not apply & once out of pocket has been met, pt will be covered at 100%.     Will forward to [name], [place] to order and follow up with patient.    Thanks!"  03/2020   --She is 79 years old, and I am not sure that she actually understands what I was asking her.  We have preapproved Monovisc, and this is covered without any prior authorization.  I do not think that this would have changed in the 6 weeks that we checked for her approval.  I am not sure what else to tell her, and I went over this in some detail today.  Is there any way 1 of you could call her and just say we have already gotten prior approval.  The only question is what her deductible is.

## 2020-06-01 NOTE — Patient Instructions (Addendum)
Monovisc - covered by your insurance but you need to check with your insurance what your deductible.  OSTEOARTHRITIS: Over the counter  Tylenol: 2 tablets up to 3-4 times a day Regular NSAIDS are helpful (avoid in kidney disease and ulcers)  Topical Capzaicin Cream, as needed (wear glove to put on) - THIS IS EXCEPTIONALLY HOT  Supplements: Tart cherry juice and Curcumin (Turmeric extract) have good scientific evidence  - get the concentrated capsules or gelcaps over the counter so you do not get the calories from the juice.  Weight loss will always take stress off of the joints and back  Volaren 1% gel. Over the counter You can apply up to 4 times a day Minimal is absorbed in the bloodstream Cost is about 9 dollars  Ice joints on bad days, 20 min, 2-3 x / day REGULAR EXERCISE: swimming, Yoga, Tai Chi, bicycle (NON-IMPACT activity)

## 2020-06-01 NOTE — Telephone Encounter (Signed)
Patient called stating that she checked with her insurance company and they will not cover [name]. Patient stated she asked them what they would cover and they told her that she would need to contact her doctor back about that.

## 2020-06-01 NOTE — Progress Notes (Signed)
Rehman Levinson T. Nataniel Gasper, MD, CAQ Sports Medicine  Primary Care and Sports Medicine [facility] at [facility] [address] [place], [address]  Phone: [phone]  FAX: [phone]  [name] - 79 y.o. female  MRN [id number]  Date of Birth: 02-01-1941  Date: 06/01/2020  PCP: Joaquim Nam, MD  Referral: Joaquim Nam, MD  Chief Complaint  Patient presents with  . Knee Pain    Right    This visit occurred during the SARS-CoV-2 public health emergency.  Safety protocols were in place, including screening questions prior to the visit, additional usage of staff PPE, and extensive cleaning of exam room while observing appropriate contact time as indicated for disinfecting solutions.   Subjective:   [name] is a 79 y.o. very pleasant female patient with Body mass index is 37.23 kg/m. who presents with the following:  R knee end-stage DJD: She has known end-stage degenerative joint disease in her right knee.  She complains of knee pain medially, laterally, and posteriorly.  In addition she does have some swelling in and about the posterior aspect of her knee as well as some in the calf which is larger than the left calf.  I have done some steroid injections with her knees which is provided short amounts of relief.  I saw her last on Apr 06, 2020.  At that time we got her Monovisc preapproved, but the patient did not know what her deductible was not so she did not follow-up about this.  Review of Systems is noted in the HPI, as appropriate   Objective:   BP 140/80   Pulse 76   Temp 98.2 F (36.8 C) (Temporal)   Ht 5' 3.5" (1.613 m)   Wt 213 lb 8 oz (96.8 kg)   SpO2 97%   BMI 37.23 kg/m   GEN: No acute distress; alert,appropriate. PULM: Breathing comfortably in no respiratory distress PSYCH: Normally interactive.    Right knee: Notable tenderness on medial and lateral joint lines with notable crepitus and to degree loss of  extension.  Flexion to 98 degrees.  Stable to varus and valgus stress and cruciate ligaments are intact.  Any forced flexion is painful.  Homans test is positive.  Any squeezing of the lower extremity and in the calf causes pain on the right greater compared to the left.  There is some swelling on the right around the entirety of the lower extremity compared to the left.  Radiology: CLINICAL DATA:  Chronic bilateral knee pain.  EXAM: LEFT KNEE - COMPLETE 4+ VIEW; RIGHT KNEE - COMPLETE 4+ VIEW  COMPARISON:  Right knee x-rays dated May 08, 2017.  FINDINGS: Left knee: No acute fracture or dislocation. Small joint effusion. New severe medial compartment joint space narrowing with bone-on-bone apposition. Unchanged lateral and patellofemoral compartment marginal osteophytes with relative preservation of the joint spaces. Soft tissues are unremarkable. Vascular calcifications.  Right knee: No acute fracture or dislocation. No significant joint effusion. Progressive lateral compartment joint space narrowing, now with bone-on-bone apposition. New mild medial and patellofemoral compartment joint space narrowing with prominent marginal patellar osteophytes. Soft tissues are unremarkable. Vascular calcifications.  IMPRESSION: 1. Left knee tricompartmental degenerative changes, now severe in the medial compartment. 2. Right knee tricompartmental degenerative changes, end-stage in the lateral compartment.   Electronically Signed   By: Obie Dredge M.D.   On: 12/01/2018 15:10  Assessment and Plan:     ICD-10-CM   1. Pain of right  lower extremity  M79.604 US Venous Img Lower Unilateral Right (DVT)  2. Swelling of right lower extremity  M79.89 US Venous Img Lower Unilateral Right (DVT)  3. Primary osteoarthritis of right knee  M17.11    Total encounter time: 30 minutes. On the day of the patient encounter, this can include review of prior records, labs, and imaging.  Additional  time can include counselling, consultation with peer MD in person or by telephone.  This also includes independent review of Radiology.  Long conversation about potential treatment options, anatomy, possible medications and supplements that could be used.  Obtain a ultrasound of the right lower extremity to evaluate for potential DVT.  She certainly has end-stage degenerative joint disease on the right.  I do not think anything short of a total knee arthroplasty would provide her with some significant longstanding relief.  Encouraged her also to inquire about her financial responsibility for hyaluronic acid, and Monovisc is her preferred agent and has already been preapproved.  Patient Instructions  Monovisc - covered by your insurance but you need to check with your insurance what your deductible.  OSTEOARTHRITIS: Over the counter  Tylenol: 2 tablets up to 3-4 times a day Regular NSAIDS are helpful (avoid in kidney disease and ulcers)  Topical Capzaicin Cream, as needed (wear glove to put on) - THIS IS EXCEPTIONALLY HOT  Supplements: Tart cherry juice and Curcumin (Turmeric extract) have good scientific evidence  - get the concentrated capsules or gelcaps over the counter so you do not get the calories from the juice.  Weight loss will always take stress off of the joints and back  Volaren 1% gel. Over the counter You can apply up to 4 times a day Minimal is absorbed in the bloodstream Cost is about 9 dollars  Ice joints on bad days, 20 min, 2-3 x / day REGULAR EXERCISE: swimming, Yoga, Tai Chi, bicycle (NON-IMPACT activity)      Follow-up: No follow-ups on file.  No orders of the defined types were placed in this encounter.  There are no discontinued medications. Orders Placed This Encounter  Procedures  . US Venous Img Lower Unilateral Right (DVT)    Signed,  Amad Mau T. Lynsie Mcwatters, MD   Outpatient Encounter Medications as of 06/01/2020  Medication Sig  . acetaminophen  (TYLENOL) 325 MG tablet Take 2 tablets (650 mg total) by mouth 3 (three) times daily as needed.  Marland Kitchen LORazepam (ATIVAN) 1 MG tablet Take 1/4-1/2 tablet by mouth every 8 hours as needed  . Multiple Vitamins-Minerals (PRESERVISION AREDS 2 PO) Take by mouth.  . Vitamin D, Ergocalciferol, (DRISDOL) 1.25 MG (50000 UNIT) CAPS capsule Take 1 capsule (50,000 Units total) by mouth every 7 (seven) days.   No facility-administered encounter medications on file as of 06/01/2020.

## 2020-06-02 ENCOUNTER — Ambulatory Visit
Admission: RE | Admit: 2020-06-02 | Discharge: 2020-06-02 | Disposition: A | Discharge status: Home / Self Care | Payer: PPO | Source: Ambulatory Visit | Attending: Family Medicine | Admitting: Family Medicine

## 2020-06-02 ENCOUNTER — Telehealth: Payer: Self-pay

## 2020-06-02 DIAGNOSIS — R600 Localized edema: Secondary | ICD-10-CM | POA: Diagnosis not present

## 2020-06-02 DIAGNOSIS — M79604 Pain in right leg: Secondary | ICD-10-CM | POA: Diagnosis not present

## 2020-06-02 DIAGNOSIS — M7989 Other specified soft tissue disorders: Secondary | ICD-10-CM | POA: Diagnosis not present

## 2020-06-02 NOTE — Telephone Encounter (Signed)
Would you call her and tell her it is totally normal. No dvt  [name] is out of town.  You can leave a message if no answer.  Thanks!

## 2020-06-02 NOTE — Telephone Encounter (Signed)
I received a call report from [facility] with [facility] about patient's recent US of her right leg.   IMPRESSION: No evidence of deep venous thrombosis in the right lower extremity. Note that right peroneal vein not well seen. Left common femoral vein patent. There is soft tissue edema on the right.  Will route to Dr. Lorelei Pont & Dr. Damita Dunnings as Juluis Rainier

## 2020-06-02 NOTE — Telephone Encounter (Signed)
Patient has been notified of these results and she verbalized understanding.

## 2020-06-02 NOTE — Telephone Encounter (Addendum)
Noted. I thank all involved.  ?

## 2020-06-03 NOTE — Telephone Encounter (Signed)
Contacted our billing department and they did not know exactly what the insurances allowed amount was for the injections.  Contacted the insurance and they advised without a claim they would "charge 100% of the allowable amount by the [employer] plan allowed". They would not give a figure and said they could not unless a claim was filed.    Benefit was run through [email or web address] again under pharmacy benefits to see what it would cost with specialty pharmacy. This will need to be signed by provider on Monday. Paperwork is on this nurses desk.   Benefits run through [employer] again under pharmacy benefits to see if cost can be determined that way.    Contacted pt and advised of new course of action and that this nurse would contact her next week with an update. Pt appreciative and verbalized understanding.

## 2020-06-03 NOTE — Telephone Encounter (Signed)
Contacted pt and advised of out of pocket maximum ($3400) and amount met ($49.60) and product copay (20%) and admin copay ($30). Pt is unsure if she can afford this and does not know how much this will cost her. She heard the injs will cost about $2000 each. Advised this nurse will make some calls and see if she can find out for her. Pt appreciative.   Did a chat with a person on [employer] portal and it is copied below; Thanks. It looks like the patient will be responsible for 20% of the allowed amount up to their out of pocket maximum. If you need to know what the allowed amount is, that would be with either your office's billing department or the patient's insurance claims department. They do not provide that information to [place]. Vida Roller  So the pt would need to call her insurance. This is an 79 year old pt and I am not sure she is able to do this Me  No, the allowed amount is something that your office and the insurance determined. The patient would not have that information either. Vida Roller  So could I contact our billing department to get that infomation Me  Yes, or the claims department with the patient's insurance. Vida Roller

## 2020-06-08 NOTE — Telephone Encounter (Signed)
Benefit paperwork to run through pharmacy signed by Dr. Lorelei Pont. Faxed form to [phone]

## 2020-06-13 NOTE — Telephone Encounter (Signed)
Pharmacy benefits do not cover injections.   Contacted pt and advised unable to find out cost the 20% she would owe and that pharmacy benefit does not cover. Pt had a lot of questions about injections and comparing to surgery. Advised best if she had conversation with Dr. Lorelei Pont. Advised she could have virtual visit. Pt does not have capability of video. Pt agreed to phone visit. Scheduled pt for phone visit on 7/26. Pt very appreciative.  Advised if anything is needed before apt to contact office. Pt verbalized understanding.

## 2020-06-13 NOTE — Telephone Encounter (Signed)
I am happy to go over with her again

## 2020-06-19 NOTE — Progress Notes (Signed)
Emmalie Haigh T. Amare Bail, MD Primary Care and Sports Medicine [facility] at [facility] [address] [place], [address] Phone: [phone]  FAX: [phone]  [name] - 79 y.o. female  MRN [id number]  Date of Birth: 12-02-1940  Visit Date: 06/20/2020  PCP: Joaquim Nam, MD  Referred by: Joaquim Nam, MD  Virtual Visit via Telephone Note:  I connected with  [name] on 06/20/2020 10:40 AM EDT by telephone and verified that I am speaking with the correct person using two identifiers.   Location patient: home phone or cell phone Location provider: work or home office Consent: Verbal consent directly obtained from [employer] and that there may be a patient responsible charge related to this service. Persons participating in the virtual visit: patient, provider  I discussed the limitations of evaluation and management by telemedicine and the availability of in person appointments.  The patient expressed understanding and agreed to proceed.     History of Present Illness:  See below  CLINICAL DATA:  Chronic bilateral knee pain.   EXAM: LEFT KNEE - COMPLETE 4+ VIEW; RIGHT KNEE - COMPLETE 4+ VIEW   COMPARISON:  Right knee x-rays dated May 08, 2017.   FINDINGS: Left knee: No acute fracture or dislocation. Small joint effusion. New severe medial compartment joint space narrowing with bone-on-bone apposition. Unchanged lateral and patellofemoral compartment marginal osteophytes with relative preservation of the joint spaces. Soft tissues are unremarkable. Vascular calcifications.   Right knee: No acute fracture or dislocation. No significant joint effusion. Progressive lateral compartment joint space narrowing, now with bone-on-bone apposition. New mild medial and patellofemoral compartment joint space narrowing with prominent marginal patellar osteophytes. Soft tissues are unremarkable. Vascular calcifications.     IMPRESSION: 1. Left knee tricompartmental degenerative changes, now severe in the medial compartment. 2. Right knee tricompartmental degenerative changes, end-stage in the lateral compartment.     Electronically Signed   By: Obie Dredge M.D.   On: 12/01/2018 15:10 CLINICAL DATA:  Chronic bilateral knee pain.   EXAM: LEFT KNEE - COMPLETE 4+ VIEW; RIGHT KNEE - COMPLETE 4+ VIEW   COMPARISON:  Right knee x-rays dated May 08, 2017.   FINDINGS: Left knee: No acute fracture or dislocation. Small joint effusion. New severe medial compartment joint space narrowing with bone-on-bone apposition. Unchanged lateral and patellofemoral compartment marginal osteophytes with relative preservation of the joint spaces. Soft tissues are unremarkable. Vascular calcifications.   Right knee: No acute fracture or dislocation. No significant joint effusion. Progressive lateral compartment joint space narrowing, now with bone-on-bone apposition. New mild medial and patellofemoral compartment joint space narrowing with prominent marginal patellar osteophytes. Soft tissues are unremarkable. Vascular calcifications.   IMPRESSION: 1. Left knee tricompartmental degenerative changes, now severe in the medial compartment. 2. Right knee tricompartmental degenerative changes, end-stage in the lateral compartment.     Electronically Signed   By: Obie Dredge M.D.   On: 12/01/2018 15:10   Review of Systems: pertinent positives and pertinent negatives as per HPI No acute distress verbally   Observations/Objective/Exam:  An attempt was made to discern vital signs over the phone and per patient if applicable and possible.   Neurological:     Mental Status: pleasant and appropriate   Psychiatric:        Thought Content: Thought content normal.      Assessment and Plan:    ICD-10-CM   1. Bilateral End-stage Knee OA  M17.0    Total  encounter time: 30 minutes. This includes total time  spent on the day of encounter.  I had a very long conversation with the patient, and I also had a reasonably extensive review of her records including office notes as well as prior x-rays and independently looked at them myself.  She had many questions, most of these had to do with her own Ambulance person.  I explained to her that in no way am I able to understand or know her deductibles or financial implications of services as well as procedures with her insurance product.  We have better preapproved for Monovisc twice, and there is a 0 copayment for this viscosupplementation, with the exception of her deductible requirement.  I went over this with her approximately 5 times, and she still had some questions.  Recommended to her a number of herself call her insurance.  At this point she would like to speak with a total joint surgeon, Dr. Ernest Pine, for his opinion in terms of potential surgical options.  She is hesitant, but I do think that a total knee replacement is likely the only thing that is going to give her longstanding relief.  We talked about extensively some of the other conservative measures that we have talked about for her knees, and she has failed all manners of conservative measurements.  She is going to follow-up with me in approximately 3 weeks for bilateral steroid injections in her knee.  I discussed the assessment and treatment plan with the patient. The patient was provided an opportunity to ask questions and all were answered. The patient agreed with the plan and demonstrated an understanding of the instructions.   The patient was advised to call back or seek an in-person evaluation if the symptoms worsen or if the condition fails to improve as anticipated.  Follow-up: prn unless noted otherwise below No follow-ups on file.  No orders of the defined types were placed in this encounter.  No orders of the defined types were placed in this encounter.   Signed,  Elpidio Galea.  Jastin Fore, MD

## 2020-06-20 ENCOUNTER — Telehealth: Payer: Self-pay

## 2020-06-20 ENCOUNTER — Telehealth (INDEPENDENT_AMBULATORY_CARE_PROVIDER_SITE_OTHER): Payer: PPO | Admitting: Family Medicine

## 2020-06-20 ENCOUNTER — Ambulatory Visit: Payer: PPO | Admitting: Family Medicine

## 2020-06-20 ENCOUNTER — Encounter: Payer: Self-pay | Admitting: Family Medicine

## 2020-06-20 VITALS — Ht 63.5 in

## 2020-06-20 DIAGNOSIS — M170 Bilateral primary osteoarthritis of knee: Secondary | ICD-10-CM

## 2020-06-20 NOTE — Telephone Encounter (Signed)
[place] [facility] - Client Nonclinical Telephone Record AccessNurse Client [place] [facility] - Client Client Site [place] Physician Owens Loffler - MD Contact Type Call Who Is Calling Patient / Member / Family / Caregiver Caller Name [place] Phone Number [phone] Patient Name [name] Patient DOB [birth year] Call Type Message Only Information Provided Reason for Call Request for General Office Information Initial Comment Caller states she has an appt today about her knee. Her appt is 1040am. Does she need to come in the office or is the appt in the office Disp. Time Disposition Final User 06/20/2020 7:44:42 AM General Information Provided Yes Artis Flock Call Closed By: Artis Flock Transaction Date/Time: 06/20/2020 7:42:26 AM (ET)

## 2020-06-20 NOTE — Telephone Encounter (Signed)
Patient is scheduled for a 10:40 am phone visit with Dr. Lorelei Pont.

## 2020-07-08 ENCOUNTER — Other Ambulatory Visit: Payer: Self-pay | Admitting: Family Medicine

## 2020-07-08 NOTE — Telephone Encounter (Signed)
Electronic refill request. Drisdol Last office visit:   06/01/2020 Last Filled:    13 capsule 0 04/15/2020  Does she need Vit D level first?

## 2020-07-11 NOTE — Telephone Encounter (Signed)
Yes, she needs labs first.  Already ordered.  Please schedule.  Thanks.

## 2020-07-13 ENCOUNTER — Other Ambulatory Visit: Payer: Self-pay | Admitting: Family Medicine

## 2020-07-13 ENCOUNTER — Telehealth: Payer: Self-pay

## 2020-07-13 ENCOUNTER — Ambulatory Visit (INDEPENDENT_AMBULATORY_CARE_PROVIDER_SITE_OTHER): Payer: PPO | Admitting: Family Medicine

## 2020-07-13 ENCOUNTER — Encounter: Payer: Self-pay | Admitting: Family Medicine

## 2020-07-13 ENCOUNTER — Other Ambulatory Visit: Payer: Self-pay

## 2020-07-13 ENCOUNTER — Other Ambulatory Visit: Payer: PPO

## 2020-07-13 VITALS — BP 160/90 | HR 74 | Temp 98.6°F | Ht 63.5 in | Wt 212.2 lb

## 2020-07-13 DIAGNOSIS — M170 Bilateral primary osteoarthritis of knee: Secondary | ICD-10-CM

## 2020-07-13 DIAGNOSIS — E559 Vitamin D deficiency, unspecified: Secondary | ICD-10-CM

## 2020-07-13 DIAGNOSIS — R748 Abnormal levels of other serum enzymes: Secondary | ICD-10-CM | POA: Diagnosis not present

## 2020-07-13 DIAGNOSIS — R4589 Other symptoms and signs involving emotional state: Secondary | ICD-10-CM

## 2020-07-13 LAB — VITAMIN D 25 HYDROXY (VIT D DEFICIENCY, FRACTURES): VITD: 22.24 ng/mL — ABNORMAL LOW (ref 30.00–100.00)

## 2020-07-13 MED ORDER — METHYLPREDNISOLONE ACETATE 40 MG/ML IJ SUSP
80.0000 mg | Freq: Once | INTRAMUSCULAR | Status: AC
  Administered 2020-07-13: 80 mg via INTRA_ARTICULAR

## 2020-07-13 MED ORDER — VITAMIN D (ERGOCALCIFEROL) 1.25 MG (50000 UNIT) PO CAPS: 50000.0000 [IU] | ORAL_CAPSULE | ORAL | 0 refills | Status: DC

## 2020-07-13 NOTE — Telephone Encounter (Signed)
Spoke w husband.  Pt needs to schedule lab appt if as they did not come to lab today after her visit w Dr Lorelei Pont. ( She had a lab appt also for Dr Damita Dunnings orders)

## 2020-07-13 NOTE — Progress Notes (Signed)
Evangelynn Lochridge T. Brynlei Klausner, MD, CAQ Sports Medicine  Primary Care and Sports Medicine [facility] [address] [place], [address]  Phone: [phone]  FAX: [phone]  [name] - 79 y.o. female  MRN [id number]  Date of Birth: 1941-09-19  Date: 07/13/2020  PCP: Joaquim Nam, MD  Referral: Joaquim Nam, MD  Chief Complaint  Patient presents with  . Knee Pain    Bilateral    This visit occurred during the SARS-CoV-2 public health emergency.  Safety protocols were in place, including screening questions prior to the visit, additional usage of staff PPE, and extensive cleaning of exam room while observing appropriate contact time as indicated for disinfecting solutions.   Subjective:   [name] is a 79 y.o. very pleasant female patient with Body mass index is 37.01 kg/m. who presents with the following:  She is here for follow-up on bilateral knee pain.  We have preapproved her for Monovisc twice, and she never followed up on this.  Last time she was in the office reviewed this with her approximately 5 different times.  I did my best to explain management of total knee replacement as well as end-stage arthritis.  At that point, she and I both felt it would be reasonable for her to see a total joint surgeon such as Dr. Ernest Pine.  For right now, she did want a follow-up with me for some bilateral knee steroid injections for osteoarthritis. I have gotten her preapproved for viscosupplementation twice, but she is hesitant.  03/2020, last injections  She is frustrated and she is sad.  Her husband is 52 and requires a lot of help from her.  Aspiration/Injection Procedure Note [name] [birth year] Date of procedure: 07/13/2020  Procedure: Large Joint Joint Aspiration / Injection of the Right Knee Indications: Pain  Procedure Details Patient verbally consented to procedure. Risks (including potential rare risk  of infection), benefits, and alternatives explained. Sterilely prepped with Chloraprep. Ethyl cholride used for anesthesia. 8 cc Lidocaine 1% mixed with 2 mL Depo-Medrol 40 mg injected using the anteromedial approach without difficulty. No complications with procedure and tolerated well. Patient had decreased pain post-injection.  Medication: 2 mL of Depo-Medrol 40 mg, equaling Depo-Medrol 80 mg total  Aspiration/Injection Procedure Note [name] 04/26/1941 Date of procedure: 07/13/2020  Procedure: Large Joint Aspiration / Injection of the Left Knee Indications: Pain  Procedure Details Patient verbally consented to procedure. Risks (including potential rare risk of infection), benefits, and alternatives explained. Sterilely prepped with Chloraprep. Ethyl cholride used for anesthesia. 8 cc Lidocaine 1% mixed with 2 mL Depo-Medrol 40 mg injected using the anteromedial approach without difficulty. No complications with procedure and tolerated well. Patient had decreased pain post-injection.  Medication: 2 mL of Depo-Medrol 40 mg, equaling Depo-Medrol 80 mg total    ICD-10-CM   1. Primary osteoarthritis of knees, bilateral  M17.0 methylPREDNISolone acetate (DEPO-MEDROL) injection 80 mg    methylPREDNISolone acetate (DEPO-MEDROL) injection 80 mg  2. Tearfulness  R45.89   3. Vitamin D deficiency  E55.9 VITAMIN D 25 Hydroxy (Vit-D Deficiency, Fractures)  4. Alkaline phosphatase elevation  R74.8 Alkaline Phosphatase, Isoenzymes    No follow-ups on file.  Meds ordered this encounter  Medications  . methylPREDNISolone acetate (DEPO-MEDROL) injection 80 mg  . methylPREDNISolone acetate (DEPO-MEDROL) injection 80 mg   Signed,  Autumm Hattery T. Starlena Beil, MD   Outpatient Encounter Medications as of 07/13/2020  Medication Sig  . acetaminophen (TYLENOL) 325  MG tablet Take 2 tablets (650 mg total) by mouth 3 (three) times daily as needed.  . fluticasone (FLONASE) 50 MCG/ACT nasal spray Place 2  sprays into both nostrils daily.  Marland Kitchen loratadine (CLARITIN) 10 MG tablet Take 10 mg by mouth daily.  Marland Kitchen LORazepam (ATIVAN) 1 MG tablet Take 1/4-1/2 tablet by mouth every 8 hours as needed  . Multiple Vitamins-Minerals (PRESERVISION AREDS 2 PO) Take by mouth.  . Vitamin D, Ergocalciferol, (DRISDOL) 1.25 MG (50000 UNIT) CAPS capsule Take 1 capsule (50,000 Units total) by mouth every 7 (seven) days.  . [EXPIRED] methylPREDNISolone acetate (DEPO-MEDROL) injection 80 mg   . [EXPIRED] methylPREDNISolone acetate (DEPO-MEDROL) injection 80 mg    No facility-administered encounter medications on file as of 07/13/2020.

## 2020-07-15 LAB — ALKALINE PHOSPHATASE, ISOENZYMES
Alkaline Phosphatase: 122 IU/L — ABNORMAL HIGH (ref 48–121)
BONE FRACTION: 38 % (ref 14–68)
INTESTINAL FRAC.: 0 % (ref 0–18)
LIVER FRACTION: 62 % (ref 18–85)

## 2020-07-17 ENCOUNTER — Other Ambulatory Visit: Payer: Self-pay | Admitting: Family Medicine

## 2020-07-17 DIAGNOSIS — R748 Abnormal levels of other serum enzymes: Secondary | ICD-10-CM

## 2020-07-19 ENCOUNTER — Other Ambulatory Visit: Payer: Self-pay | Admitting: Family Medicine

## 2020-07-19 DIAGNOSIS — E559 Vitamin D deficiency, unspecified: Secondary | ICD-10-CM

## 2020-09-12 ENCOUNTER — Telehealth: Payer: Self-pay | Admitting: Family Medicine

## 2020-09-12 NOTE — Telephone Encounter (Signed)
Spoke to pt. She was asking what else she could try for pain as she did not like taking a lot of pills. I suggested Voltaren Gel. I told her there should be a dosage card with it show her how much to use up to 4 times a day. I told her to make an appt with Dr Lorelei Pont to discuss other options if she wanted to. She will try the Voltaren and go from there.

## 2020-09-12 NOTE — Telephone Encounter (Signed)
No sooner than the end of november

## 2020-09-12 NOTE — Telephone Encounter (Signed)
Pt called wanting to know when last time she had knee injection and when can she make an appointment for another injection

## 2020-09-12 NOTE — Telephone Encounter (Signed)
Looks like it was 07-13-20. Will forward to Dr Lorelei Pont to inform when she can get next injections or what the next plan would be.

## 2020-09-13 NOTE — Telephone Encounter (Signed)
She has end-stage knee OA.  We have discussed treatment options at all office visits.

## 2020-09-29 DIAGNOSIS — M170 Bilateral primary osteoarthritis of knee: Secondary | ICD-10-CM | POA: Diagnosis not present

## 2020-09-29 DIAGNOSIS — M25562 Pain in left knee: Secondary | ICD-10-CM | POA: Diagnosis not present

## 2020-09-29 DIAGNOSIS — M1711 Unilateral primary osteoarthritis, right knee: Secondary | ICD-10-CM | POA: Diagnosis not present

## 2020-09-29 DIAGNOSIS — M25561 Pain in right knee: Secondary | ICD-10-CM | POA: Diagnosis not present

## 2020-10-06 DIAGNOSIS — M25562 Pain in left knee: Secondary | ICD-10-CM | POA: Diagnosis not present

## 2020-10-06 DIAGNOSIS — M1712 Unilateral primary osteoarthritis, left knee: Secondary | ICD-10-CM | POA: Diagnosis not present

## 2020-10-13 DIAGNOSIS — M25561 Pain in right knee: Secondary | ICD-10-CM | POA: Diagnosis not present

## 2020-10-13 DIAGNOSIS — M1711 Unilateral primary osteoarthritis, right knee: Secondary | ICD-10-CM | POA: Diagnosis not present

## 2020-10-17 ENCOUNTER — Other Ambulatory Visit: Payer: PPO

## 2020-10-18 DIAGNOSIS — M25562 Pain in left knee: Secondary | ICD-10-CM | POA: Diagnosis not present

## 2020-10-18 DIAGNOSIS — M1712 Unilateral primary osteoarthritis, left knee: Secondary | ICD-10-CM | POA: Diagnosis not present

## 2020-11-01 ENCOUNTER — Encounter: Payer: Self-pay | Admitting: Family Medicine

## 2020-11-01 ENCOUNTER — Ambulatory Visit (INDEPENDENT_AMBULATORY_CARE_PROVIDER_SITE_OTHER): Payer: PPO | Admitting: Family Medicine

## 2020-11-01 ENCOUNTER — Other Ambulatory Visit: Payer: Self-pay

## 2020-11-01 VITALS — BP 140/80 | HR 79 | Temp 96.6°F | Ht 66.0 in | Wt 201.6 lb

## 2020-11-01 DIAGNOSIS — M25569 Pain in unspecified knee: Secondary | ICD-10-CM | POA: Diagnosis not present

## 2020-11-01 DIAGNOSIS — E559 Vitamin D deficiency, unspecified: Secondary | ICD-10-CM

## 2020-11-01 DIAGNOSIS — M1990 Unspecified osteoarthritis, unspecified site: Secondary | ICD-10-CM | POA: Diagnosis not present

## 2020-11-01 MED ORDER — VITAMIN D (ERGOCALCIFEROL) 1.25 MG (50000 UNIT) PO CAPS: 50000.0000 [IU] | ORAL_CAPSULE | ORAL | 0 refills | Status: DC

## 2020-11-01 MED ORDER — IBUPROFEN 200 MG PO TABS: 200.0000 mg | ORAL_TABLET | Freq: Three times a day (TID) | ORAL | Status: AC | PRN

## 2020-11-01 NOTE — Patient Instructions (Addendum)
Try ibuprofen with food.  Ice your knee for 5 minutes at a time.   Try to elevate your legs and we'll call about seeing ortho.  Let me know how you are doing next week.   Take care.  Glad to see you.

## 2020-11-01 NOTE — Progress Notes (Signed)
This visit occurred during the SARS-CoV-2 public health emergency.  Safety protocols were in place, including screening questions prior to the visit, additional usage of staff PPE, and extensive cleaning of exam room while observing appropriate contact time as indicated for disinfecting solutions.  Knee pain.  She had prev injections, then tried flexogenics.  Now using a walker. Discussed options.    Prev imaging with  1. Left knee tricompartmental degenerative changes, now severe in the medial compartment. 2. Right knee tricompartmental degenerative changes, end-stage in the lateral compartment.  Imaging reviewed with patient at office visit.  D/w pt about getting back on vitamin D.  Defer recheck today.    Taking ibuprofen 400mg  QD for pain..    Meds, vitals, and allergies reviewed.   ROS: Per HPI unless specifically indicated in ROS section   nad ncat Neck supple, no LA rrr ctab  1cm scrape on the R shin.  This does not appear infected 1+ BLE edema.   R knee ttp on joint line and pain on ROM.   Walking slowly with rolling walker.

## 2020-11-02 NOTE — Assessment & Plan Note (Signed)
Detailed conversation with patient.  See above. Reasonable to try ibuprofen with food.  She can take ibuprofen twice a day to see if that helps. Ice the knee for 5 minutes at a time.   Try to elevate legs and we'll call about seeing ortho.  I want her to update me about her situation next week. I think it makes sense for her to talk to orthopedics about potential surgery.  If she does not get any relief with ibuprofen then we can see about other pain medicine options in the meantime, as a temporary measure.  I did talk with her about taking vitamin D to make sure her level was normal in case she ended up needing surgery. At least 30 minutes were devoted to patient care in this encounter (this can include time spent reviewing the patient's file/history, interviewing and examining the patient, counseling/reviewing plan with patient, ordering referrals, ordering tests, reviewing relevant laboratory or x-ray data, and documenting the encounter).

## 2020-12-15 ENCOUNTER — Encounter: Payer: Self-pay | Admitting: Internal Medicine

## 2020-12-15 ENCOUNTER — Telehealth: Payer: Self-pay

## 2020-12-15 ENCOUNTER — Other Ambulatory Visit: Payer: Self-pay

## 2020-12-15 ENCOUNTER — Ambulatory Visit (INDEPENDENT_AMBULATORY_CARE_PROVIDER_SITE_OTHER): Payer: PPO | Admitting: Internal Medicine

## 2020-12-15 VITALS — BP 140/88 | HR 76 | Temp 96.9°F | Ht 63.5 in | Wt 199.0 lb

## 2020-12-15 DIAGNOSIS — L97911 Non-pressure chronic ulcer of unspecified part of right lower leg limited to breakdown of skin: Secondary | ICD-10-CM

## 2020-12-15 DIAGNOSIS — L280 Lichen simplex chronicus: Secondary | ICD-10-CM | POA: Diagnosis not present

## 2020-12-15 DIAGNOSIS — L97909 Non-pressure chronic ulcer of unspecified part of unspecified lower leg with unspecified severity: Secondary | ICD-10-CM | POA: Insufficient documentation

## 2020-12-15 DIAGNOSIS — M7989 Other specified soft tissue disorders: Secondary | ICD-10-CM | POA: Insufficient documentation

## 2020-12-15 MED ORDER — TRIAMCINOLONE ACETONIDE 0.1 % EX CREA: 1.0000 "application " | TOPICAL_CREAM | Freq: Two times a day (BID) | CUTANEOUS | 1 refills | Status: DC | PRN

## 2020-12-15 MED ORDER — FUROSEMIDE 40 MG PO TABS: 40.0000 mg | ORAL_TABLET | Freq: Every day | ORAL | 0 refills | Status: DC | PRN

## 2020-12-15 NOTE — Telephone Encounter (Signed)
[facility] [place] Day - Client TELEPHONE ADVICE RECORD [facility] Patient Name: [name] Gender: Female DOB: 11-13-1941 Age: 80 Y 2 M 22 D Return Phone Number: [phone] (Primary) Address: City/State/Zip: [place] [address] Client [place] Day - Client Client Site [facility] Physician Renford Dills - MD Contact Type Call Who Is Calling Patient / Member / Family / Caregiver Call Type Triage / Clinical Caller Name Mirka Barbone Relationship To Patient Self Return Phone Number [phone] (Primary) Chief Complaint Itching Reason for Call Symptomatic / Request for Health Information Initial Comment Caller states she is itching on lower part of both legs to her feet but no rash . Translation No Nurse Assessment Nurse: Radford Pax, RN, Eugene Garnet Date/Time Eilene Ghazi Time): 12/15/2020 9:05:35 AM Confirm and document reason for call. If symptomatic, describe symptoms. ---Caller states that she has broke out in a itchy rash. Worse on lower right leg, ankle and foot, but is on both. Had a slow healing injury on the same leg. Does the patient have any new or worsening symptoms? ---Yes Will a triage be completed? ---Yes Related visit to physician within the last 2 weeks? ---No Does the PT have any chronic conditions? (i.e. diabetes, asthma, this includes High risk factors for pregnancy, etc.) ---No Is this a behavioral health or substance abuse call? ---No Guidelines Guideline Title Affirmed Question Affirmed Notes Nurse Date/Time (Eastern Time) Rash or Redness - Localized Looks like a boil, infected sore, deep ulcer or other infected rash Radford Pax, RN, Rebekah 12/15/2020 9:09:20 AM Disp. Time Eilene Ghazi Time) Disposition Final User 12/15/2020 9:15:29 AM See PCP within 24 Hours Yes Turner, RN, Eugene Garnet Caller Disagree/Comply Comply Caller Understands Yes PreDisposition Call Doctor PLEASE NOTE: All timestamps  contained within this report are represented as [place] Standard Time. CONFIDENTIALTY NOTICE: This fax transmission is intended only for the addressee. It contains information that is legally privileged, confidential or otherwise protected from use or disclosure. If you are not the intended recipient, you are strictly prohibited from reviewing, disclosing, copying using or disseminating any of this information or taking any action in reliance on or regarding this information. If you have received this fax in error, please notify us immediately by telephone so that we can arrange for its return to [place]. Phone: [phone], Toll-Free: [phone], Fax: [phone] Page: 2 of 2 Call Id: [id number] Care Advice Given Per Guideline SEE PCP WITHIN 24 HOURS: * IF OFFICE WILL BE OPEN: You need to be examined within the next 24 hours. Call your doctor (or NP/PA) when the office opens and make an appointment. CLEANING AN INFECTED WOUND: * Wash the infected area with soap and water 3 times a day. ANTIBIOTIC OINTMENT - INFECTED AREA: * Put a small amount of antibiotic ointment on the infected area 3 times per day. * You can get this over-the-counter (OTC) at a drugstore. CALL BACK IF: * Fever occurs * You become worse CARE ADVICE given per Rash - Localized and Cause Unknown (Adult) guideline. Referrals REFERRED TO PCP OFFICE

## 2020-12-15 NOTE — Assessment & Plan Note (Signed)
Rash on foot looks like this Not infectious Will try triamcinolone cream

## 2020-12-15 NOTE — Telephone Encounter (Signed)
Patient has an appointment with Dr. Silvio Pate today at 1:30.

## 2020-12-15 NOTE — Assessment & Plan Note (Addendum)
This is chronic and may be worse since the trauma 2 weeks ago Is tender and some in anterior calf Did have negative duplex scan in July--but I think we should repeat this Will try furosemide for now---follow up 2 weeks for labs and recheck If DVT--will start with xarelto 15mg  bid

## 2020-12-15 NOTE — Progress Notes (Signed)
Subjective:    Patient ID: [name], female    DOB: [birth year], 80 y.o.   MRN: [id number]  HPI Here due to rash and swelling of her right foot This visit occurred during the SARS-CoV-2 public health emergency.  Safety protocols were in place, including screening questions prior to the visit, additional usage of staff PPE, and extensive cleaning of exam room while observing appropriate contact time as indicated for disinfecting solutions.   Needs TKR --"but that is not why I am here" Started with rash on right foot several days ago Bad itching Did hit her right calf on a heavy battery in the garage about 2-3 weeks ago Swelling in both legs---goes back a year More in right leg--but no acute change Has open area from the trauma and slight discharge  Current Outpatient Medications on File Prior to Visit  Medication Sig Dispense Refill  . acetaminophen (TYLENOL) 325 MG tablet Take 2 tablets (650 mg total) by mouth 3 (three) times daily as needed.    . fluticasone (FLONASE) 50 MCG/ACT nasal spray Place 2 sprays into both nostrils daily.    Marland Kitchen ibuprofen (ADVIL) 200 MG tablet Take 1-2 tablets (200-400 mg total) by mouth every 8 (eight) hours as needed (with food, for knee pain).    Marland Kitchen loratadine (CLARITIN) 10 MG tablet Take 10 mg by mouth daily.    Marland Kitchen LORazepam (ATIVAN) 1 MG tablet Take 1/4-1/2 tablet by mouth every 8 hours as needed 30 tablet 0  . Multiple Vitamins-Minerals (PRESERVISION AREDS 2 PO) Take by mouth.    . Vitamin D, Ergocalciferol, (DRISDOL) 1.25 MG (50000 UNIT) CAPS capsule Take 1 capsule (50,000 Units total) by mouth every 7 (seven) days. 13 capsule 0   No current facility-administered medications on file prior to visit.    Allergies  Allergen Reactions  . Lipitor [Atorvastatin]     intolerant  . Lisinopril Other (See Comments)    Intolerant, abd pain  . Metoprolol Succinate     REACTION: Exhaustion, increased dizziness  . Nitrofurantoin     REACTION: Rash  .  Sulfadiazine     REACTION: Rash  . Yellow Jacket Venom [Bee Venom]     Short of breath    Past Medical History:  Diagnosis Date  . Anxiety   . Arthritis   . Breast cancer ([place]) 2001   s/p rady and lumpectomy  . Carcinoma in situ of breast 2001  . Cataract 1988, [place]  . Esophageal reflux   . Fatty liver   . Fibromyalgia   . Hx of adenomatous colonic polyps   . Labyrinthitis, unspecified   . Pure hypercholesterolemia   . Unspecified essential hypertension   . Urinary tract infection, site not specified     Past Surgical History:  Procedure Laterality Date  . BREAST BIOPSY  07/2000   positive right side, [facility]   . BREAST LUMPECTOMY  08/20/2000   right breast, malignant stage I, [facility]  . CATARACT EXTRACTION  1988, 1990  . FOOT SURGERY  1980   minor     Family History  Problem Relation Age of Onset  . Prostate cancer Brother   . Heart disease Brother   . Pancreatitis Brother        x 2  . Prostate cancer Brother   . Alzheimer's disease Father   . Dementia Father   . Parkinsonism Mother   . Heart disease Brother   . Heart disease Brother   . Breast cancer  Paternal Aunt   . Colon cancer Neg Hx     Social History   Socioeconomic History  . Marital status: Married    Spouse name: Not on file  . Number of children: Not on file  . Years of education: Not on file  . Highest education level: Not on file  Occupational History  . Occupation: Housewife  Tobacco Use  . Smoking status: Never Smoker  . Smokeless tobacco: Never Used  Vaping Use  . Vaping Use: Never used  Substance and Sexual Activity  . Alcohol use: No    Alcohol/week: 0.0 standard drinks  . Drug use: No  . Sexual activity: Not Currently  Other Topics Concern  . Not on file  Social History Narrative   Patient does not get regular exercise   Daily caffeine-one per day   Married 1958   Retired from [employer]: [place]    . Difficulty of Paying Living Expenses: Not hard at all  Food Insecurity: No Food Insecurity  . Worried About Charity fundraiser in the Last Year: Never true  . Ran Out of Food in the Last Year: Never true  Transportation Needs: No Transportation Needs  . Lack of Transportation (Medical): No  . Lack of Transportation (Non-Medical): No  Physical Activity: Inactive  . Days of Exercise per Week: 0 days  . Minutes of Exercise per Session: 0 min  Stress: No Stress Concern Present  . Feeling of Stress : Not at all  Social Connections: Not on file  Intimate Partner Violence: Not At Risk  . Fear of Current or Ex-Partner: No  . Emotionally Abused: No  . Physically Abused: No  . Sexually Abused: No   Review of Systems No fever No SOB No chest pain    Objective:   Physical Exam Musculoskeletal:     Comments: 3+ edema in right calf--1+ in left Tenderness posteriorly on right and in medial thigh  Skin:    Comments: Partially granulated ulcer lateral lower right calf--not infected Scaly excoriated rash on anterior foot and ankle --right            Assessment & Plan:

## 2020-12-15 NOTE — Assessment & Plan Note (Signed)
Seems to be granulating and no infection Observation only

## 2020-12-16 NOTE — Telephone Encounter (Signed)
Noted. Thanks.

## 2020-12-20 ENCOUNTER — Ambulatory Visit: Payer: PPO

## 2020-12-20 ENCOUNTER — Telehealth: Payer: Self-pay | Admitting: Internal Medicine

## 2020-12-20 NOTE — Telephone Encounter (Signed)
Called and spoke with patient regarding this matter. Patient stated that currently she is looking after her husband who suffers from COPD and has had frequent falls. Patient stated that currently her husband was on the floor. This RN instructed patient that she needed to get her husband some help to get out of the floor by either having family/friends come help or call 911 to help. Patient stated that she was fearful of having anyone come into the house around her husband D/T his history. Educated patient that by leaving him on the floor for an extended period of time, this can cause issues for him. Patient stated, "Well he's not laying on the floor, he is sitting on a stool that is low to the ground." Instructed patient that she should still seek help to help him get up. Patient verbalized understanding. Patient then stated, "I'm sorry I cancelled my appointments, I just need to take care of my husband right now." This RN explained to patient that she should reschedule the ultrasound to be done as soon as possible to rule out a DVT because this could be life threatening if left untreated. Patient verbalized understanding, but repeated that she needed to care for her husband. This RN informed patient that if she was not healthy and taking care of herself, it is hard to care for her husband. Patient verbalized understanding and stated that she would call and re-schedule the ultrasound.

## 2020-12-20 NOTE — Telephone Encounter (Signed)
FYI I am leaving this in your able hands. Not sure what to do next

## 2020-12-20 NOTE — Telephone Encounter (Signed)
Please reach out to her---she canceled the stat ultrasound of her leg to rule out DVT--stating she needed to care for her husband

## 2020-12-20 NOTE — Telephone Encounter (Signed)
[name] gave specific instructions to help patient and husband.  I think that was completely appropriate.  We'll have to defer to patient to schedule the scan.  Please check with patient tomorrow and encourage her to schedule.  Thanks.

## 2020-12-23 ENCOUNTER — Ambulatory Visit: Payer: PPO | Admitting: Family Medicine

## 2020-12-23 DIAGNOSIS — Z0289 Encounter for other administrative examinations: Secondary | ICD-10-CM

## 2021-01-07 ENCOUNTER — Other Ambulatory Visit: Payer: Self-pay | Admitting: Internal Medicine

## 2021-01-23 ENCOUNTER — Other Ambulatory Visit: Payer: Self-pay | Admitting: Family Medicine

## 2021-01-23 NOTE — Telephone Encounter (Signed)
She needed f/u from before.  I sent a 2 week supply to carry through to the OV with Dr. Lorelei Pont.  I am not comfortable with 90 day supply w/o f/u in the meantime.

## 2021-01-23 NOTE — Telephone Encounter (Signed)
Refill request change to a 90 day supply from pharmacy Last refill 01/09/21 Last office visit Dr. Silvio Pate 12/15/20 Dr. Damita Dunnings appointment 12/23/20 no show Upcoming appointment Dr. Lorelei Pont 01/25/21 Okay to change to a 90 day supply

## 2021-01-25 ENCOUNTER — Encounter: Payer: Self-pay | Admitting: Family Medicine

## 2021-01-25 ENCOUNTER — Ambulatory Visit (INDEPENDENT_AMBULATORY_CARE_PROVIDER_SITE_OTHER): Payer: PPO | Admitting: Family Medicine

## 2021-01-25 ENCOUNTER — Other Ambulatory Visit: Payer: Self-pay

## 2021-01-25 VITALS — BP 132/84 | HR 74 | Temp 98.3°F | Ht 63.5 in | Wt 198.5 lb

## 2021-01-25 DIAGNOSIS — M170 Bilateral primary osteoarthritis of knee: Secondary | ICD-10-CM

## 2021-01-25 MED ORDER — TRIAMCINOLONE ACETONIDE 40 MG/ML IJ SUSP
40.0000 mg | Freq: Once | INTRAMUSCULAR | Status: AC
  Administered 2021-01-25: 40 mg via INTRA_ARTICULAR

## 2021-01-25 NOTE — Progress Notes (Signed)
[name] Dome T. Klinton Candelas, MD, CAQ Sports Medicine  Primary Care and Sports Medicine [facility] [address] [place], [address]  Phone: [phone]  FAX: [phone]  MECHEL PATRAS - 80 y.o. female  MRN [id number]  Date of Birth: [birth year]  Date: 01/25/2021  PCP: Joaquim Nam, MD  Referral: Joaquim Nam, MD  Chief Complaint  Patient presents with  . Knee Pain    Bilateral knee injections    This visit occurred during the SARS-CoV-2 public health emergency.  Safety protocols were in place, including screening questions prior to the visit, additional usage of staff PPE, and extensive cleaning of exam room while observing appropriate contact time as indicated for disinfecting solutions.   Subjective:   MARLEINA METTER is a 80 y.o. very pleasant female patient with Body mass index is 34.61 kg/m. who presents with the following:  End-stage B OA knees:  F/u with request for steroid injections.  She has known end-stage osteoarthritis of both knees.  For pain management, work and I do bilateral corticosteroid injections today.  Also had a conversation today with the patient and her wife about appropriate needs for total knee replacement.  The patient is somewhat reluctant and she does have a known history of fibromyalgia.  This is a person dependent decision, but she clearly does have end-stage osteoarthritis.  Aspiration/Injection Procedure Note MIYAH HEINERT Jul 18, 1941 Date of procedure: 01/25/2021  Procedure: Large Joint Joint Aspiration / Injection of the Right Knee Indications: Pain  Procedure Details Patient verbally consented to procedure. Risks, benefits, and alternatives explained. Sterilely prepped with Chloraprep. Ethyl cholride used for anesthesia. 9 cc Lidocaine 1% mixed with 1 mL Kenalog 40 mg injected using the anteromedial approach without difficulty. No complications with procedure and tolerated well.  Patient had decreased pain post-injection.  Medication: 1 mL of Kenalog 40 mg  Aspiration/Injection Procedure Note FALICIA IMRIE 1941-06-03 Date of procedure: 01/25/2021  Procedure: Large Joint Aspiration / Injection of the Left Knee Indications: Pain  Procedure Details Patient verbally consented to procedure. Risks, benefits, and alternatives explained. Sterilely prepped with Chloraprep. Ethyl cholride used for anesthesia. 9 cc Lidocaine 1% mixed with 1 mL Kenalog 40 mg injected using the anteromedial approach without difficulty. No complications with procedure and tolerated well. Patient had decreased pain post-injection.  Medication: 1 mL of Kenalog 40 mg    ICD-10-CM   1. Primary osteoarthritis of knees, bilateral  M17.0 triamcinolone acetonide (KENALOG-40) injection 40 mg    triamcinolone acetonide (KENALOG-40) injection 40 mg    Meds ordered this encounter  Medications  . triamcinolone acetonide (KENALOG-40) injection 40 mg  . triamcinolone acetonide (KENALOG-40) injection 40 mg    Signed,  Shimon Trowbridge T. Rasheema Truluck, MD

## 2021-03-07 ENCOUNTER — Other Ambulatory Visit: Payer: Self-pay

## 2021-03-07 ENCOUNTER — Ambulatory Visit (INDEPENDENT_AMBULATORY_CARE_PROVIDER_SITE_OTHER): Payer: PPO | Admitting: Primary Care

## 2021-03-07 ENCOUNTER — Encounter: Payer: Self-pay | Admitting: Primary Care

## 2021-03-07 VITALS — BP 144/88 | HR 79 | Temp 96.2°F | Ht 63.5 in | Wt 194.5 lb

## 2021-03-07 DIAGNOSIS — R300 Dysuria: Secondary | ICD-10-CM

## 2021-03-07 LAB — POC URINALSYSI DIPSTICK (AUTOMATED)
Bilirubin, UA: NEGATIVE
Glucose, UA: NEGATIVE
Ketones, UA: POSITIVE
Nitrite, UA: POSITIVE
Protein, UA: POSITIVE — AB
Spec Grav, UA: 1.02 (ref 1.010–1.025)
Urobilinogen, UA: 1 E.U./dL
pH, UA: 7 (ref 5.0–8.0)

## 2021-03-07 MED ORDER — CEFDINIR 300 MG PO CAPS: 300.0000 mg | ORAL_CAPSULE | Freq: Two times a day (BID) | ORAL | 0 refills | Status: DC

## 2021-03-07 NOTE — Assessment & Plan Note (Signed)
Acute for the last several weeks. UA today with 3+ leuks, positive nitrites, 3+ blood. Culture sent.  UA and symptoms combined are suspicious for cystitis. Rx for Cefdinir 300 mg provided for BID x 7 days.   Allergy to sulfa and Macrobid. Discussed water intake.

## 2021-03-07 NOTE — Progress Notes (Signed)
Subjective:    Patient ID: [name], female    DOB: 1940-12-25, 80 y.o.   MRN: [id number]  HPI  [name] is a very pleasant 80 y.o. female patient of Dr. Damita Dunnings with a history of fibromyalgia, osteoarthritis, anxiety, paresthesia who presents today with a chief complaint of dysuria.  She also reports urinary urgency. She denies hematuria, vaginal discharge, nausea, abdominal pain. Symptoms began a few weeks ago, intermittently. She's been taking Cystex OTC with temporary improvement.    Review of Systems  Constitutional: Negative for fever.  Gastrointestinal: Negative for nausea.  Genitourinary: Positive for dysuria, frequency and urgency.         Past Medical History:  Diagnosis Date  . Anxiety   . Arthritis   . Breast cancer ([place]) 2001   s/p rady and lumpectomy  . Carcinoma in situ of breast 2001  . Cataract 1988, [place]  . Esophageal reflux   . Fatty liver   . Fibromyalgia   . Hx of adenomatous colonic polyps   . Labyrinthitis, unspecified   . Pure hypercholesterolemia   . Unspecified essential hypertension   . Urinary tract infection, site not specified     Social History   Socioeconomic History  . Marital status: Married    Spouse name: Not on file  . Number of children: Not on file  . Years of education: Not on file  . Highest education level: Not on file  Occupational History  . Occupation: Housewife  Tobacco Use  . Smoking status: Never Smoker  . Smokeless tobacco: Never Used  Vaping Use  . Vaping Use: Never used  Substance and Sexual Activity  . Alcohol use: No    Alcohol/week: 0.0 standard drinks  . Drug use: No  . Sexual activity: Not Currently  Other Topics Concern  . Not on file  Social History Narrative   Patient does not get regular exercise   Daily caffeine-one per day   Married 1958   Retired from [employer]: [place]   . Difficulty of Paying  Living Expenses: Not hard at all  Food Insecurity: No Food Insecurity  . Worried About Charity fundraiser in the Last Year: Never true  . Ran Out of Food in the Last Year: Never true  Transportation Needs: No Transportation Needs  . Lack of Transportation (Medical): No  . Lack of Transportation (Non-Medical): No  Physical Activity: Inactive  . Days of Exercise per Week: 0 days  . Minutes of Exercise per Session: 0 min  Stress: No Stress Concern Present  . Feeling of Stress : Not at all  Social Connections: Not on file  Intimate Partner Violence: Not At Risk  . Fear of Current or Ex-Partner: No  . Emotionally Abused: No  . Physically Abused: No  . Sexually Abused: No    Past Surgical History:  Procedure Laterality Date  . BREAST BIOPSY  07/2000   positive right side, [facility]   . BREAST LUMPECTOMY  08/20/2000   right breast, malignant stage I, [facility]  . CATARACT EXTRACTION  1988, 1990  . FOOT SURGERY  1980   minor     Family History  Problem Relation Age of Onset  . Prostate cancer Brother   . Heart disease Brother   . Pancreatitis Brother        x 2  . Prostate cancer Brother   . Alzheimer's disease Father   .  Dementia Father   . Parkinsonism Mother   . Heart disease Brother   . Heart disease Brother   . Breast cancer Paternal Aunt   . Colon cancer Neg Hx     Allergies  Allergen Reactions  . Lipitor [Atorvastatin]     intolerant  . Lisinopril Other (See Comments)    Intolerant, abd pain  . Metoprolol Succinate     REACTION: Exhaustion, increased dizziness  . Nitrofurantoin     REACTION: Rash  . Sulfadiazine     REACTION: Rash  . Yellow Jacket Venom [Bee Venom]     Short of breath    Current Outpatient Medications on File Prior to Visit  Medication Sig Dispense Refill  . acetaminophen (TYLENOL) 325 MG tablet Take 2 tablets (650 mg total) by mouth 3 (three) times daily as needed.    Marland Kitchen ibuprofen (ADVIL) 200 MG tablet Take 1-2 tablets (200-400 mg total) by  mouth every 8 (eight) hours as needed (with food, for knee pain).    . LORazepam (ATIVAN) 1 MG tablet Take 1/4-1/2 tablet by mouth every 8 hours as needed 30 tablet 0  . Multiple Vitamins-Minerals (PRESERVISION AREDS 2 PO) Take by mouth.    . triamcinolone (KENALOG) 0.1 % Apply 1 application topically 2 (two) times daily as needed. 45 g 1  . Vitamin D, Ergocalciferol, (DRISDOL) 1.25 MG (50000 UNIT) CAPS capsule Take 1 capsule (50,000 Units total) by mouth every 7 (seven) days. 13 capsule 0  . furosemide (LASIX) 40 MG tablet TAKE 1 TABLET BY MOUTH EVERY DAY AS NEEDED (Patient not taking: No sig reported) 14 tablet 0   No current facility-administered medications on file prior to visit.    BP (!) 144/88   Pulse 79   Temp (!) 96.2 F (35.7 C) (Temporal)   Ht 5' 3.5" (1.613 m)   Wt 194 lb 8 oz (88.2 kg)   SpO2 99%   BMI 33.91 kg/m  Objective:   Physical Exam Constitutional:      General: She is not in acute distress.    Appearance: She is not ill-appearing.  Cardiovascular:     Rate and Rhythm: Normal rate and regular rhythm.  Pulmonary:     Effort: Pulmonary effort is normal.     Breath sounds: Normal breath sounds.  Musculoskeletal:     Cervical back: Neck supple.  Skin:    General: Skin is warm and dry.           Assessment & Plan:      This visit occurred during the SARS-CoV-2 public health emergency.  Safety protocols were in place, including screening questions prior to the visit, additional usage of staff PPE, and extensive cleaning of exam room while observing appropriate contact time as indicated for disinfecting solutions.

## 2021-03-07 NOTE — Patient Instructions (Signed)
Start cefdinir 300 mg antibiotics for presumed urinary tract infection. Take 1 capsule by mouth twice daily for 7 days.  Ensure you are consuming 64 ounces of water daily.  It was a pleasure to see you today!

## 2021-03-08 LAB — URINE CULTURE
MICRO NUMBER:: 11760220
SPECIMEN QUALITY:: ADEQUATE

## 2021-03-20 ENCOUNTER — Encounter: Payer: Self-pay | Admitting: Family Medicine

## 2021-03-20 ENCOUNTER — Other Ambulatory Visit: Payer: Self-pay

## 2021-03-20 ENCOUNTER — Ambulatory Visit (INDEPENDENT_AMBULATORY_CARE_PROVIDER_SITE_OTHER): Payer: PPO | Admitting: Family Medicine

## 2021-03-20 VITALS — BP 144/88 | HR 80 | Temp 97.6°F | Ht 64.0 in | Wt 198.0 lb

## 2021-03-20 DIAGNOSIS — E559 Vitamin D deficiency, unspecified: Secondary | ICD-10-CM | POA: Diagnosis not present

## 2021-03-20 DIAGNOSIS — H9319 Tinnitus, unspecified ear: Secondary | ICD-10-CM

## 2021-03-20 DIAGNOSIS — R300 Dysuria: Secondary | ICD-10-CM

## 2021-03-20 DIAGNOSIS — M1990 Unspecified osteoarthritis, unspecified site: Secondary | ICD-10-CM | POA: Diagnosis not present

## 2021-03-20 LAB — POC URINALSYSI DIPSTICK (AUTOMATED)
Bilirubin, UA: NEGATIVE
Blood, UA: NEGATIVE
Glucose, UA: NEGATIVE
Ketones, UA: NEGATIVE
Leukocytes, UA: NEGATIVE
Nitrite, UA: NEGATIVE
Protein, UA: NEGATIVE
Spec Grav, UA: 1.025 (ref 1.010–1.025)
Urobilinogen, UA: 0.2 E.U./dL
pH, UA: 6 (ref 5.0–8.0)

## 2021-03-20 NOTE — Patient Instructions (Addendum)
I would try an OTC probiotic.   Let's check a urine culture today.  Your initial test was fine.  Take care.  Glad to see you.

## 2021-03-20 NOTE — Progress Notes (Signed)
This visit occurred during the SARS-CoV-2 public health emergency.  Safety protocols were in place, including screening questions prior to the visit, additional usage of staff PPE, and extensive cleaning of exam room while observing appropriate contact time as indicated for disinfecting solutions.  Dysuria: no burning now.   abdominal pain: none currently.   Fevers: no back pain: she has lower back pain in the AM at baseline.  She got a new mattress and that helped.   Vomiting:no Prev OV note d/w pt.    She has noted clicking in L ear.  Has seen ENT.  Better in the meantime.  She wasn't in need of a hearing aid.     She is off abx, still with diarrhea.  No blood in stool.  No black stools.    She had h/o intentional weight loss.    She had prev knee injections, those helped with knee pain.  No recent lasix use.  Swelling was better after prev knee injection.  She is considering follow up injection later in this summer.   H/o low vit D.  D/w pt about recheck vit D.  She wanted to defer at this point since she had been missing doses.  We talked about getting on a schedule to take vitamin D.   Meds, vitals, and allergies reviewed.  Per HPI unless specifically indicated in ROS section   GEN: nad, alert and oriented HEENT: NCAT, tympanic membranes and canals normal bilaterally. NECK: supple CV: rrr.  PULM: ctab, no inc wob ABD: soft, +bs, suprapubic area not tender EXT: no edema SKIN: no acute rash BACK: no CVA pain

## 2021-03-21 LAB — URINE CULTURE
MICRO NUMBER:: 11809621
SPECIMEN QUALITY:: ADEQUATE

## 2021-03-22 NOTE — Assessment & Plan Note (Signed)
She had ENT follow-up.  I do not see any intervention at this point that would improve her situation.  She is some better in the meantime and I think it makes sense to observe for now.

## 2021-03-22 NOTE — Assessment & Plan Note (Signed)
She had previous knee injections and she is considering follow-up injection later this summer.  Discussed.

## 2021-03-22 NOTE — Assessment & Plan Note (Signed)
History of, reasonable to recheck urine culture today.  I presume that she has diarrhea related to previous antibiotic use and it would be reasonable to take a probiotic.  Her abdomen is benign.  Okay for outpatient follow-up.  She will update me as needed.

## 2021-03-22 NOTE — Assessment & Plan Note (Signed)
See above.  She declined recheck level today.  Discussed adherence.  She will update me as needed.

## 2021-05-02 NOTE — Progress Notes (Signed)
Selwyn Reason T. Harmoney Sienkiewicz, MD, CAQ Sports Medicine [facility] at [facility] [address] [place], [address]  Phone: [phone]  FAX: [phone]  [name] - 80 y.o. female  MRN [id number]  Date of Birth: 27-Aug-1941  Date: 05/03/2021  PCP: Joaquim Nam, MD  Referral: Joaquim Nam, MD  Chief Complaint  Patient presents with   Knee Pain    Bilateral Knee Injections    This visit occurred during the SARS-CoV-2 public health emergency.  Safety protocols were in place, including screening questions prior to the visit, additional usage of staff PPE, and extensive cleaning of exam room while observing appropriate contact time as indicated for disinfecting solutions.   Subjective:   [name] is a 80 y.o. very pleasant female patient with Body mass index is 34.24 kg/m. who presents with the following:    ICD-10-CM   1. Primary osteoarthritis of knees, bilateral  M17.0 triamcinolone acetonide (KENALOG-40) injection 40 mg    triamcinolone acetonide (KENALOG-40) injection 40 mg   She has known bilateral end-stage degenerative joint disease of each knee.  She has been trying to manage things from a nonoperative standpoint.  She is 59 and also has some fibromyalgia.  At this point, she has no interest in pursuing any form of joint replacement.  Aspiration/Injection Procedure Note [name] [birth year] Date of procedure: 05/03/2021  Procedure: Large Joint Joint Aspiration / Injection of the Right Knee Indications: Pain  Procedure Details Patient verbally consented to procedure. Risks, benefits, and alternatives explained. Sterilely prepped with Chloraprep. Ethyl cholride used for anesthesia. 9 cc Lidocaine 1% mixed with 1 mL Kenalog 40 mg injected using the anteromedial approach without difficulty. No complications with procedure and tolerated well. Patient had decreased pain post-injection.  Medication: 1 mL of Kenalog 40  mg  Aspiration/Injection Procedure Note [name] 10/07/1941 Date of procedure: 05/03/2021  Procedure: Large Joint Aspiration / Injection of the Left Knee Indications: Pain  Procedure Details Patient verbally consented to procedure. Risks, benefits, and alternatives explained. Sterilely prepped with Chloraprep. Ethyl cholride used for anesthesia. 9 cc Lidocaine 1% mixed with 1 mL Kenalog 40 mg injected using the anteromedial approach without difficulty. No complications with procedure and tolerated well. Patient had decreased pain post-injection.  Medication: 1 mL of Kenalog 40 mg  Meds ordered this encounter  Medications   triamcinolone acetonide (KENALOG-40) injection 40 mg   triamcinolone acetonide (KENALOG-40) injection 40 mg    Signed,  Addisson Frate T. Mera Gunkel, MD

## 2021-05-03 ENCOUNTER — Encounter: Payer: Self-pay | Admitting: Family Medicine

## 2021-05-03 ENCOUNTER — Other Ambulatory Visit: Payer: Self-pay

## 2021-05-03 ENCOUNTER — Ambulatory Visit (INDEPENDENT_AMBULATORY_CARE_PROVIDER_SITE_OTHER): Payer: PPO | Admitting: Family Medicine

## 2021-05-03 VITALS — BP 122/84 | HR 68 | Temp 97.9°F | Ht 64.0 in | Wt 199.5 lb

## 2021-05-03 DIAGNOSIS — M170 Bilateral primary osteoarthritis of knee: Secondary | ICD-10-CM | POA: Diagnosis not present

## 2021-05-03 MED ORDER — TRIAMCINOLONE ACETONIDE 40 MG/ML IJ SUSP
40.0000 mg | Freq: Once | INTRAMUSCULAR | Status: AC
  Administered 2021-05-03: 12:00:00 40 mg via INTRA_ARTICULAR

## 2021-05-03 MED ORDER — TRIAMCINOLONE ACETONIDE 40 MG/ML IJ SUSP
40.0000 mg | Freq: Once | INTRAMUSCULAR | Status: AC
  Administered 2021-05-03: 40 mg via INTRA_ARTICULAR

## 2021-05-04 ENCOUNTER — Encounter: Payer: Self-pay | Admitting: Family Medicine

## 2021-07-10 ENCOUNTER — Telehealth: Payer: Self-pay | Admitting: Family Medicine

## 2021-07-10 NOTE — Telephone Encounter (Signed)
Pt called to cancel her appt, she stated that her knee is much better

## 2021-07-10 NOTE — Telephone Encounter (Signed)
[place] Day - Client TELEPHONE ADVICE RECORD AccessNurse Patient Name: [name] Gender: Female DOB: October 21, 1941 Age: 80 Y 9 M 17 D Return Phone Number: [phone] (Primary), [phone] (Secondary) Address: City/ State/ Zip: [place] [address] Client [place] [facility] - Client Client Site [place] - Day Physician Renford Dills - MD Contact Type Call Who Is Calling Patient / Member / Family / Caregiver Call Type Triage / Clinical Relationship To Patient Self Return Phone Number [phone] (Primary) Chief Complaint Leg Swelling And Edema Reason for Call Symptomatic / Request for Health Information Initial Comment Call sent from office due to sx. Caller requesting an appt. Caller states halfway down from her right knee is red and swollen. States she is not sure if it is a bug bite but she felt something bit her or it is a blood clot. Translation No Nurse Assessment Nurse: Gloriann Loan, RN, Sharyn Lull Date/Time (Eastern Time): 07/10/2021 8:19:50 AM Confirm and document reason for call. If symptomatic, describe symptoms. ---Call sent from office due to sx. Caller requesting an appt. Caller states halfway down from her right knee is red and swollen. States she is not sure if it is a bug bite but she felt something bit her or it is a blood clot. Symptoms were noticed 4 days ago. Something bit her, wasn't a sting but it hurt. Does the patient have any new or worsening symptoms? ---Yes Will a triage be completed? ---Yes Related visit to physician within the last 2 weeks? ---No Does the PT have any chronic conditions? (i.e. diabetes, asthma, this includes High risk factors for pregnancy, etc.) ---No Is this a behavioral health or substance abuse call? ---No Guidelines Guideline Title Affirmed Question Affirmed Notes Nurse Date/Time (Eastern Time) Insect Bite [1] Red or very tender (to touch)  area AND [2] started over 24 hours after the bite Gloriann Loan, RN, Sharyn Lull 07/10/2021 8:22:17 AM Disp. Time Eilene Ghazi Time) Disposition Final User PLEASE NOTE: All timestamps contained within this report are represented as [place] Standard Time. CONFIDENTIALTY NOTICE: This fax transmission is intended only for the addressee. It contains information that is legally privileged, confidential or otherwise protected from use or disclosure. If you are not the intended recipient, you are strictly prohibited from reviewing, disclosing, copying using or disseminating any of this information or taking any action in reliance on or regarding this information. If you have received this fax in error, please notify us immediately by telephone so that we can arrange for its return to [place]. Phone: [phone], Toll-Free: [phone], Fax: [phone] Page: 2 of 2 Call Id: [id number] 07/10/2021 8:26:32 AM See PCP within 24 Hours Yes Gloriann Loan, RN, Julio Sicks Disagree/Comply Comply Caller Understands Yes PreDisposition Call Doctor Care Advice Given Per Guideline SEE PCP WITHIN 24 HOURS: * IF OFFICE WILL BE OPEN: You need to be examined within the next 24 hours. Call your doctor (or NP/PA) when the office opens and make an appointment. CALL BACK IF: * Fever occurs * You become worse CARE ADVICE given per Insect Bite (Adult) guideline. Comments User: Leodis Sias, RN Date/Time Eilene Ghazi Time): 07/10/2021 8:27:07 AM Transferred caller back to main line for an apt as directive states. Referrals REFERRED TO PCP OFFICE

## 2021-07-10 NOTE — Telephone Encounter (Signed)
[name] called in thorugh access nurse with swelling of the ankle and knee and its red, wanted to know what to do the nurse didn't give her any thing to do until tomorrow '@11'$ 

## 2021-07-10 NOTE — Telephone Encounter (Signed)
I spoke with pt; starting on 07/06/21 pt noticed something bit her on lower rt leg; pt did not see anything bite her but she thinks something bit her on her leg;pt said lower rt leg is swollen and red and warm and today pain level is 3 - 4. The redness is as large as a softball in back of lower leg. ED precautions given and pt voiced understanding but wants to wait a "little while" and see how she does. I explained to pt that the symptoms that she is having could be related to a blood clot in her leg and pt said after talking with the first nurse that she may have a little SOB. No CP. Again advised pt should go to ED for eval and possible imaging. Pt voiced understanding and will wait to see how she feels. Sending note to DR Damita Dunnings and Janett Billow CMA. Also sending Teams to [employer] CMA to make sure Dr Damita Dunnings sees this note. Pt has already scheduled appt with Romilda Garret NP on 07/11/21.

## 2021-07-10 NOTE — Telephone Encounter (Signed)
I'll defer to patient but this sounds likely reasonable advice (ie ER dispo).  Thanks.

## 2021-07-11 ENCOUNTER — Ambulatory Visit: Payer: PPO | Admitting: Nurse Practitioner

## 2021-07-30 ENCOUNTER — Emergency Department
Admission: EM | Admit: 2021-07-30 | Discharge: 2021-07-30 | Disposition: A | Discharge status: Home / Self Care | Payer: PPO | Attending: Student in an Organized Health Care Education/Training Program | Admitting: Student in an Organized Health Care Education/Training Program

## 2021-07-30 ENCOUNTER — Other Ambulatory Visit: Payer: Self-pay

## 2021-07-30 ENCOUNTER — Emergency Department: Payer: PPO

## 2021-07-30 DIAGNOSIS — S0990XA Unspecified injury of head, initial encounter: Secondary | ICD-10-CM

## 2021-07-30 DIAGNOSIS — L03116 Cellulitis of left lower limb: Secondary | ICD-10-CM | POA: Diagnosis not present

## 2021-07-30 DIAGNOSIS — Y92009 Unspecified place in unspecified non-institutional (private) residence as the place of occurrence of the external cause: Secondary | ICD-10-CM | POA: Diagnosis not present

## 2021-07-30 DIAGNOSIS — W19XXXA Unspecified fall, initial encounter: Secondary | ICD-10-CM

## 2021-07-30 DIAGNOSIS — Z853 Personal history of malignant neoplasm of breast: Secondary | ICD-10-CM | POA: Diagnosis not present

## 2021-07-30 DIAGNOSIS — W1839XA Other fall on same level, initial encounter: Secondary | ICD-10-CM | POA: Diagnosis not present

## 2021-07-30 DIAGNOSIS — S0101XA Laceration without foreign body of scalp, initial encounter: Secondary | ICD-10-CM | POA: Insufficient documentation

## 2021-07-30 DIAGNOSIS — S0003XA Contusion of scalp, initial encounter: Secondary | ICD-10-CM | POA: Diagnosis not present

## 2021-07-30 DIAGNOSIS — Z23 Encounter for immunization: Secondary | ICD-10-CM | POA: Diagnosis not present

## 2021-07-30 DIAGNOSIS — N3000 Acute cystitis without hematuria: Secondary | ICD-10-CM | POA: Insufficient documentation

## 2021-07-30 LAB — COMPREHENSIVE METABOLIC PANEL
ALT: 14 U/L (ref 0–44)
AST: 15 U/L (ref 15–41)
Albumin: 4.4 g/dL (ref 3.5–5.0)
Alkaline Phosphatase: 103 U/L (ref 38–126)
Anion gap: 8 (ref 5–15)
BUN: 11 mg/dL (ref 8–23)
CO2: 27 mmol/L (ref 22–32)
Calcium: 9.3 mg/dL (ref 8.9–10.3)
Chloride: 107 mmol/L (ref 98–111)
Creatinine, Ser: 0.76 mg/dL (ref 0.44–1.00)
GFR, Estimated: >60 mL/min (ref >60)
Glucose, Bld: 117 mg/dL — ABNORMAL HIGH (ref 70–99)
Potassium: 4.1 mmol/L (ref 3.5–5.1)
Sodium: 142 mmol/L (ref 135–145)
Total Bilirubin: 1.5 mg/dL — ABNORMAL HIGH (ref 0.3–1.2)
Total Protein: 7.1 g/dL (ref 6.5–8.1)

## 2021-07-30 LAB — CBC
HCT: 41.8 % (ref 36.0–46.0)
Hemoglobin: 14.1 g/dL (ref 12.0–15.0)
MCH: 30.9 pg (ref 26.0–34.0)
MCHC: 33.7 g/dL (ref 30.0–36.0)
MCV: 91.5 fL (ref 80.0–100.0)
Platelets: 237 10*3/uL (ref 150–400)
RBC: 4.57 MIL/uL (ref 3.87–5.11)
RDW: 12.2 % (ref 11.5–15.5)
WBC: 8.9 10*3/uL (ref 4.0–10.5)
nRBC: 0 % (ref 0.0–0.2)

## 2021-07-30 LAB — URINALYSIS, COMPLETE (UACMP) WITH MICROSCOPIC
Bilirubin Urine: NEGATIVE
Glucose, UA: NEGATIVE mg/dL
Hgb urine dipstick: NEGATIVE
Nitrite: NEGATIVE
Protein, ur: NEGATIVE mg/dL
Specific Gravity, Urine: 1.015 (ref 1.005–1.030)
Squamous Epithelial / HPF: NONE SEEN (ref 0–5)
pH: 6 (ref 5.0–8.0)

## 2021-07-30 MED ORDER — CEPHALEXIN 500 MG PO CAPS
500.0000 mg | ORAL_CAPSULE | Freq: Once | ORAL | Status: AC
  Administered 2021-07-30: 500 mg via ORAL
  Filled 2021-07-30: qty 1

## 2021-07-30 MED ORDER — CEPHALEXIN 500 MG PO CAPS: 500.0000 mg | ORAL_CAPSULE | Freq: Three times a day (TID) | ORAL | 0 refills | Status: DC

## 2021-07-30 MED ORDER — TETANUS-DIPHTH-ACELL PERTUSSIS 5-2.5-18.5 LF-MCG/0.5 IM SUSY
0.5000 mL | PREFILLED_SYRINGE | Freq: Once | INTRAMUSCULAR | Status: AC
  Administered 2021-07-30: 0.5 mL via INTRAMUSCULAR
  Filled 2021-07-30: qty 0.5

## 2021-07-30 MED ORDER — LIDOCAINE HCL (PF) 1 % IJ SOLN
5.0000 mL | Freq: Once | INTRAMUSCULAR | Status: AC
  Administered 2021-07-30: 5 mL via INTRADERMAL
  Filled 2021-07-30: qty 5

## 2021-07-30 NOTE — ED Notes (Addendum)
See triage note  Presents s/p fall

## 2021-07-30 NOTE — ED Triage Notes (Signed)
Pt arrives to ER after a fall at home. Fell in [employer], [employer]. Hit head. Denies LOC. Denies blood thinners. Pt is A&O x 4. Has ice pack to head. Pt c/o of knot on head. No blood noted in hair. States her legs got tangled up. Usually uses cane/walker but wasn't when she fell.

## 2021-07-30 NOTE — ED Provider Notes (Signed)
[facility] Emergency Department Provider Note    Event Date/Time   First MD Initiated Contact with Patient 07/30/21 1216     (approximate)  I have reviewed the triage vital signs and the nursing notes.   HISTORY  Chief Complaint Fall    HPI [name] is a 80 y.o. female presents to the ER with the below listed past medical history after fall while she was out working in her yard, has lost her balance fell back and hit the back of her head on [place].  No LOC.  Denies any palpitation or chest pain.  No shortness of breath.  No generalized weakness.  Denies any neck pain.  Has chronic knee pain denies any injury to her legs or extremity.  Denies any belly pain.  No new medications.  Past Medical History:  Diagnosis Date   Anxiety    Arthritis    Breast cancer ([place]) 2001   s/p rady and lumpectomy   Carcinoma in situ of breast 2001   [place]   Esophageal reflux    Fatty liver    Fibromyalgia    Hx of adenomatous colonic polyps    Labyrinthitis, unspecified    Pure hypercholesterolemia    Unspecified essential hypertension    Urinary tract infection, site not specified    Family History  Problem Relation Age of Onset   Prostate cancer Brother    Heart disease Brother    Pancreatitis Brother        x 2   Prostate cancer Brother    Alzheimer's disease Father    Dementia Father    Parkinsonism Mother    Heart disease Brother    Heart disease Brother    Breast cancer Paternal Aunt    Colon cancer Neg Hx    Past Surgical History:  Procedure Laterality Date   BREAST BIOPSY  07/2000   positive right side, [facility]    BREAST LUMPECTOMY  08/20/2000   right breast, malignant stage I, [facility]   CATARACT EXTRACTION  1988, [name]   minor    Patient Active Problem List   Diagnosis Date Noted   Swelling of calf 12/15/2020   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/2020    Alkaline phosphatase elevation 04/18/2020   Health care maintenance [id number]   Ear clicks, unspecified laterality 09/11/2018   Edema 06/11/2018   Paresthesia 06/11/2018   Vitamin D deficiency 05/03/2017   Bunion 05/03/2017   Rash and nonspecific skin eruption 06/05/2016   History of nonmelanoma skin cancer 03/11/2015   Dysuria 12/07/2014   Anxiety state 10/31/2014   Advance care planning 10/31/2014   Iliotibial band syndrome 02/11/2014   Hyperglycemia 09/17/2013   OA (osteoarthritis) 12/04/2012   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 Authorizing Provider  cephALEXin (KEFLEX) 500 MG capsule Take 1 capsule (500 mg total) by mouth 3 (three) times daily for 7 days. 07/30/21 08/06/21 Yes Merlyn Lot, MD  acetaminophen (TYLENOL) 325 MG tablet Take 2 tablets (650 mg total) by mouth 3 (three) times daily as needed. 02/24/16   Tonia Ghent, MD  furosemide (  LASIX) 40 MG tablet TAKE 1 TABLET BY MOUTH EVERY DAY AS NEEDED 01/23/21   Tonia Ghent, MD  ibuprofen (ADVIL) 200 MG tablet Take 1-2 tablets (200-400 mg total) by mouth every 8 (eight) hours as needed (with food, for knee pain). 11/01/20   Tonia Ghent, MD  LORazepam (ATIVAN) 1 MG tablet Take 1/4-1/2 tablet by mouth every 8 hours as needed 05/02/17   Tonia Ghent, MD  Multiple Vitamins-Minerals (PRESERVISION AREDS 2 PO) Take by mouth.    [provider]  triamcinolone (KENALOG) 0.1 % Apply 1 application topically 2 (two) times daily as needed. 12/15/20   Venia Carbon, MD  Vitamin D, Ergocalciferol, (DRISDOL) 1.25 MG (50000 UNIT) CAPS capsule Take 1 capsule (50,000 Units total) by mouth every 7  (seven) days. 11/01/20   Tonia Ghent, MD    Allergies Lipitor [atorvastatin], Lisinopril, Metoprolol succinate, Nitrofurantoin, Sulfadiazine, and Yellow jacket venom [bee venom]    Social History Social History   Tobacco Use   Smoking status: Never   Smokeless tobacco: Never  Vaping Use   Vaping Use: Never used  Substance Use Topics   Alcohol use: No    Alcohol/week: 0.0 standard drinks   Drug use: No    Review of Systems Patient denies headaches, rhinorrhea, blurry vision, numbness, shortness of breath, chest pain, edema, cough, abdominal pain, nausea, vomiting, diarrhea, dysuria, fevers, rashes or hallucinations unless otherwise stated above in HPI. ____________________________________________   PHYSICAL EXAM:  VITAL SIGNS: Vitals:   07/30/21 1132 07/30/21 1447  BP: (!) 175/91 (!) 167/89  Pulse: 79 82  Resp: 16 17  Temp: 98.1 F (36.7 C)   SpO2: 98% 99%    Constitutional: Alert and oriented.  Eyes: Conjunctivae are normal.  Head: Large hematoma of the right parietal scalp with 1 cm laceration currently hemostatic. Nose: No congestion/rhinnorhea. Mouth/Throat: Mucous membranes are moist.   Neck: No stridor. Painless ROM.  Cardiovascular: Normal rate, regular rhythm. Grossly normal heart sounds.  Good peripheral circulation. Respiratory: Normal respiratory effort.  No retractions. Lungs CTAB. Gastrointestinal: Soft and nontender. No distention. No abdominal bruits. No CVA tenderness. Genitourinary:  Musculoskeletal: No lower extremity tenderness nor edema.  No joint effusions. Neurologic:  Normal speech and language. No gross focal neurologic deficits are appreciated. No facial droop Skin:  Skin is warm, dry and intact. No rash noted. Psychiatric: Mood and affect are normal. Speech and behavior are normal.  ____________________________________________   LABS (all labs ordered are listed, but only abnormal results are displayed)  Results for orders  placed or performed during the hospital encounter of 07/30/21 (from the past 24 hour(s))  CBC     Status: None   Collection Time: 07/30/21  1:02 PM  Result Value Ref Range   WBC 8.9 4.0 - 10.5 K/uL   RBC 4.57 3.87 - 5.11 MIL/uL   Hemoglobin 14.1 12.0 - 15.0 g/dL   HCT 41.8 36.0 - 46.0 %   MCV 91.5 80.0 - 100.0 fL   MCH 30.9 26.0 - 34.0 pg   MCHC 33.7 30.0 - 36.0 g/dL   RDW 12.2 11.5 - 15.5 %   Platelets 237 150 - 400 K/uL   nRBC 0.0 0.0 - 0.2 %  Comprehensive metabolic panel     Status: Abnormal   Collection Time: 07/30/21  1:02 PM  Result Value Ref Range   Sodium 142 135 - 145 mmol/L   Potassium 4.1 3.5 - 5.1 mmol/L   Chloride 107 98 - 111 mmol/L  CO2 27 22 - 32 mmol/L   Glucose, Bld 117 (H) 70 - 99 mg/dL   BUN 11 8 - 23 mg/dL   Creatinine, Ser 0.76 0.44 - 1.00 mg/dL   Calcium 9.3 8.9 - 10.3 mg/dL   Total Protein 7.1 6.5 - 8.1 g/dL   Albumin 4.4 3.5 - 5.0 g/dL   AST 15 15 - 41 U/L   ALT 14 0 - 44 U/L   Alkaline Phosphatase 103 38 - 126 U/L   Total Bilirubin 1.5 (H) 0.3 - 1.2 mg/dL   GFR, Estimated >60 >60 mL/min   Anion gap 8 5 - 15  Urinalysis, Complete w Microscopic     Status: Abnormal   Collection Time: 07/30/21  1:02 PM  Result Value Ref Range   Color, Urine YELLOW YELLOW   APPearance CLEAR CLEAR   Specific Gravity, Urine 1.015 1.005 - 1.030   pH 6.0 5.0 - 8.0   Glucose, UA NEGATIVE NEGATIVE mg/dL   Hgb urine dipstick NEGATIVE NEGATIVE   Bilirubin Urine NEGATIVE NEGATIVE   Ketones, ur TRACE (A) NEGATIVE mg/dL   Protein, ur NEGATIVE NEGATIVE mg/dL   Nitrite NEGATIVE NEGATIVE   Leukocytes,Ua SMALL (A) NEGATIVE   RBC / HPF 0-5 0 - 5 RBC/hpf   WBC, UA 11-20 0 - 5 WBC/hpf   Bacteria, UA MANY (A) NONE SEEN   Squamous Epithelial / LPF NONE SEEN 0 - 5   Mucus PRESENT    ____________________________________________  EKG My review and personal interpretation at Time: 13:12   Indication: fall  Rate: 90  Rhythm: sinus Axis: left Other: poor r wave progression, no  stemi ____________________________________________  RADIOLOGY  I personally reviewed all radiographic images ordered to evaluate for the above acute complaints and reviewed radiology reports and findings.  These findings were personally discussed with the patient.  Please see medical record for radiology report.  ____________________________________________   PROCEDURES  Procedure(s) performed:  Marland KitchenMarland KitchenLaceration Repair  Date/Time: 07/30/2021 1:44 PM Performed by: Merlyn Lot, MD Authorized by: Merlyn Lot, MD   Consent:    Consent obtained:  Verbal   Consent given by:  Patient   Risks discussed:  Infection, pain, retained foreign body, poor cosmetic result and poor wound healing Anesthesia:    Anesthesia method:  Local infiltration   Local anesthetic:  Lidocaine 1% w/o epi Laceration details:    Location:  Scalp   Scalp location:  R parietal   Length (cm):  1   Depth (mm):  1 Exploration:    Hemostasis achieved with:  Direct pressure   Contaminated: no   Treatment:    Area cleansed with:  Saline   Amount of cleaning:  Standard   Irrigation solution:  Sterile saline   Visualized foreign bodies/material removed: no   Skin repair:    Repair method:  Staples   Number of staples:  2 Approximation:    Approximation:  Close Repair type:    Repair type:  Simple Post-procedure details:    Dressing:  Sterile dressing   Procedure completion:  Tolerated well, no immediate complications    Critical Care performed: no ____________________________________________   INITIAL IMPRESSION / ASSESSMENT AND PLAN / ED COURSE  Pertinent labs & imaging results that were available during my care of the patient were reviewed by me and considered in my medical decision making (see chart for details).   DDX: fracutre, sdh,iph, contusion, cellultis, electrolyte abn, uti  [name] is a 80 y.o. who presents to the ED fall as  described above.  Does have small lacerations  repaired as described above.  Imaging ordered for above differential.  Will check blood work.  Denies any chest pain or pressure.  Does not sound syncopal in nature history more consistent with mechanical fall.  Clinical Course as of 07/30/21 1506  Sun Jul 30, 2021  1346 Patient with pain suggestive of UTI also noted to have area of erythema related to wound of her left lower extremity and states that she dropped a metal grate on it several days ago does have some surrounding erythema and warmth will cover with antibiotics for both UTI and mild cellulitis. [PR]    Clinical Course User Index [PR] Merlyn Lot, MD    The patient was evaluated in Emergency Department today for the symptoms described in the history of present illness. He/she was evaluated in the context of the global COVID-19 pandemic, which necessitated consideration that the patient might be at risk for infection with the SARS-CoV-2 virus that causes COVID-19. Institutional protocols and algorithms that pertain to the evaluation of patients at risk for COVID-19 are in a state of rapid change based on information released by regulatory bodies including the CDC and federal and state organizations. These policies and algorithms were followed during the patient's care in the ED.  As part of my medical decision making, I reviewed the following data within the [facility] notes reviewed and incorporated, Labs reviewed, notes from prior ED visits and [place] Controlled Substance Database   ____________________________________________   FINAL CLINICAL IMPRESSION(S) / ED DIAGNOSES  Final diagnoses:  Fall, initial encounter  Minor head injury, initial encounter  Laceration of scalp, initial encounter  Acute cystitis without hematuria  Cellulitis of left lower extremity      NEW MEDICATIONS STARTED DURING THIS VISIT:  Discharge Medication List as of 07/30/2021  2:26 PM     START taking these medications    Details  cephALEXin (KEFLEX) 500 MG capsule Take 1 capsule (500 mg total) by mouth 3 (three) times daily for 7 days., Starting Sun 07/30/2021, Until Sun 08/06/2021, Normal         Note:  This document was prepared using Dragon voice recognition software and may include unintentional dictation errors.    Merlyn Lot, MD 07/30/21 [phone]

## 2021-07-30 NOTE — ED Notes (Addendum)
See triage note  Presents s/p fall  States she fell landed on both knees and hit her head    No LOC

## 2021-08-03 ENCOUNTER — Encounter: Payer: Self-pay | Admitting: Family Medicine

## 2021-08-03 ENCOUNTER — Ambulatory Visit (INDEPENDENT_AMBULATORY_CARE_PROVIDER_SITE_OTHER): Payer: PPO | Admitting: Family Medicine

## 2021-08-03 ENCOUNTER — Other Ambulatory Visit: Payer: Self-pay

## 2021-08-03 VITALS — BP 150/86 | HR 69 | Temp 98.1°F | Ht 64.0 in | Wt 199.5 lb

## 2021-08-03 DIAGNOSIS — L03116 Cellulitis of left lower limb: Secondary | ICD-10-CM

## 2021-08-03 DIAGNOSIS — L309 Dermatitis, unspecified: Secondary | ICD-10-CM

## 2021-08-03 DIAGNOSIS — M170 Bilateral primary osteoarthritis of knee: Secondary | ICD-10-CM

## 2021-08-03 DIAGNOSIS — S81812D Laceration without foreign body, left lower leg, subsequent encounter: Secondary | ICD-10-CM

## 2021-08-03 MED ORDER — TRIAMCINOLONE ACETONIDE 0.1 % EX CREA: 1.0000 "application " | TOPICAL_CREAM | Freq: Two times a day (BID) | CUTANEOUS | 0 refills | Status: DC

## 2021-08-03 MED ORDER — AMOXICILLIN-POT CLAVULANATE 875-125 MG PO TABS: 1.0000 | ORAL_TABLET | Freq: Two times a day (BID) | ORAL | 0 refills | Status: DC

## 2021-08-03 NOTE — Patient Instructions (Signed)
Come back when your leg feels totally better before we can inject them.

## 2021-08-03 NOTE — Progress Notes (Signed)
Vista Sawatzky T. Esmerelda Finnigan, MD, CAQ Sports Medicine [facility] [address] [place], [address]  Phone: [phone]  FAX: [phone]  [name] - 80 y.o. female  MRN [id number]  Date of Birth: 15-Jul-1941  Date: 08/03/2021  PCP: Joaquim Nam, MD  Referral: Joaquim Nam, MD  Chief Complaint  Patient presents with   Knee Pain    Wants Bilateral  Knee Injections    This visit occurred during the SARS-CoV-2 public health emergency.  Safety protocols were in place, including screening questions prior to the visit, additional usage of staff PPE, and extensive cleaning of exam room while observing appropriate contact time as indicated for disinfecting solutions.   Subjective:   [name] is a 80 y.o. very pleasant female patient with Body mass index is 34.24 kg/m. who presents with the following:  ER fall -she had a fall and went to the emergency room on July 30, 2021.  At that point she was placed on some Keflex secondary to concern for possible cellulitis.  She did have a left leg wound with a flap it was been healing by secondary intention, but there was some redness and warmth.  She also has had some swelling and itching on the right distal lower extremity for months.  She has been putting some Neosporin on this.  She also brings up lower extremity edema, and some other medical concerns.  She actually is here for evaluation of ongoing management of osteoarthritis with a question of intra-articular injection.  Cellulitis Dermatitis  edema  Review of Systems is noted in the HPI, as appropriate  Objective:   BP (!) 150/86   Pulse 69   Temp 98.1 F (36.7 C) (Temporal)   Ht 5\' 4"  (1.626 m)   Wt 199 lb 8 oz (90.5 kg)   SpO2 99%   BMI 34.24 kg/m   GEN: No acute distress; alert,appropriate. PULM: Breathing comfortably in no respiratory distress PSYCH: Normally interactive.       She also has diffuse  knee pain with notable tenderness along both joint lines.  Laboratory and Imaging Data: Results for orders placed or performed during the hospital encounter of 07/30/21  CBC  Result Value Ref Range   WBC 8.9 4.0 - 10.5 K/uL   RBC 4.57 3.87 - 5.11 MIL/uL   Hemoglobin 14.1 12.0 - 15.0 g/dL   HCT 36.6 44.0 - 34.7 %   MCV 91.5 80.0 - 100.0 fL   MCH 30.9 26.0 - 34.0 pg   MCHC 33.7 30.0 - 36.0 g/dL   RDW 42.5 95.6 - 38.7 %   Platelets 237 150 - 400 K/uL   nRBC 0.0 0.0 - 0.2 %  Comprehensive metabolic panel  Result Value Ref Range   Sodium 142 135 - 145 mmol/L   Potassium 4.1 3.5 - 5.1 mmol/L   Chloride 107 98 - 111 mmol/L   CO2 27 22 - 32 mmol/L   Glucose, Bld 117 (H) 70 - 99 mg/dL   BUN 11 8 - 23 mg/dL   Creatinine, Ser 5.64 0.44 - 1.00 mg/dL   Calcium 9.3 8.9 - 33.2 mg/dL   Total Protein 7.1 6.5 - 8.1 g/dL   Albumin 4.4 3.5 - 5.0 g/dL   AST 15 15 - 41 U/L   ALT 14 0 - 44 U/L   Alkaline Phosphatase 103 38 - 126 U/L   Total Bilirubin 1.5 (H) 0.3 - 1.2 mg/dL   GFR, Estimated >  60 >60 mL/min   Anion gap 8 5 - 15  Urinalysis, Complete w Microscopic  Result Value Ref Range   Color, Urine YELLOW YELLOW   APPearance CLEAR CLEAR   Specific Gravity, Urine 1.015 1.005 - 1.030   pH 6.0 5.0 - 8.0   Glucose, UA NEGATIVE NEGATIVE mg/dL   Hgb urine dipstick NEGATIVE NEGATIVE   Bilirubin Urine NEGATIVE NEGATIVE   Ketones, ur TRACE (A) NEGATIVE mg/dL   Protein, ur NEGATIVE NEGATIVE mg/dL   Nitrite NEGATIVE NEGATIVE   Leukocytes,Ua SMALL (A) NEGATIVE   RBC / HPF 0-5 0 - 5 RBC/hpf   WBC, UA 11-20 0 - 5 WBC/hpf   Bacteria, UA MANY (A) NONE SEEN   Squamous Epithelial / LPF NONE SEEN 0 - 5   Mucus PRESENT      Assessment and Plan:     ICD-10-CM   1. Left leg cellulitis  L03.116     2. Laceration of left lower extremity, subsequent encounter  S81.812D     3. Dermatitis  L30.9     4. Primary osteoarthritis of knees, bilateral  M17.0      To me it looks like the patient has some  left lower extremity cellulitis.  There is warmth and some pinkish coloration.  I am going to broaden antibiotic coverage to Augmentin. -It would not be prudent to do knee injections as until the patient has improved from cellulitis and I would like for her we did be better healed.  For dermatitis, Kenalog.  Also did my best to answer some of her questions regarding other medical concerns such as edema.  Meds ordered this encounter  Medications   triamcinolone cream (KENALOG) 0.1 %    Sig: Apply 1 application topically 2 (two) times daily. On the right leg    Dispense:  454 g    Refill:  0   amoxicillin-clavulanate (AUGMENTIN) 875-125 MG tablet    Sig: Take 1 tablet by mouth 2 (two) times daily.    Dispense:  20 tablet    Refill:  0   Medications Discontinued During This Encounter  Medication Reason   triamcinolone (KENALOG) 0.1 %    cephALEXin (KEFLEX) 500 MG capsule    No orders of the defined types were placed in this encounter.   Follow-up: No follow-ups on file.  Dragon [employer] One speech-to-text software was used for transcription in this dictation.  Possible transcriptional errors can occur using Animal nutritionist.   Signed,  Elpidio Galea. Magally Vahle, MD   Outpatient Encounter Medications as of 08/03/2021  Medication Sig   acetaminophen (TYLENOL) 325 MG tablet Take 2 tablets (650 mg total) by mouth 3 (three) times daily as needed.   amoxicillin-clavulanate (AUGMENTIN) 875-125 MG tablet Take 1 tablet by mouth 2 (two) times daily.   furosemide (LASIX) 40 MG tablet TAKE 1 TABLET BY MOUTH EVERY DAY AS NEEDED   ibuprofen (ADVIL) 200 MG tablet Take 1-2 tablets (200-400 mg total) by mouth every 8 (eight) hours as needed (with food, for knee pain).   LORazepam (ATIVAN) 1 MG tablet Take 1/4-1/2 tablet by mouth every 8 hours as needed   Multiple Vitamins-Minerals (PRESERVISION AREDS 2 PO) Take by mouth.   triamcinolone cream (KENALOG) 0.1 % Apply 1 application topically 2 (two) times  daily. On the right leg   Vitamin D, Ergocalciferol, (DRISDOL) 1.25 MG (50000 UNIT) CAPS capsule Take 1 capsule (50,000 Units total) by mouth every 7 (seven) days.   [DISCONTINUED] cephALEXin (KEFLEX) 500 MG capsule Take  1 capsule (500 mg total) by mouth 3 (three) times daily for 7 days.   [DISCONTINUED] triamcinolone (KENALOG) 0.1 % Apply 1 application topically 2 (two) times daily as needed.   No facility-administered encounter medications on file as of 08/03/2021.

## 2021-08-07 ENCOUNTER — Ambulatory Visit: Payer: PPO | Admitting: Family Medicine

## 2021-08-08 DIAGNOSIS — Z4802 Encounter for removal of sutures: Secondary | ICD-10-CM | POA: Diagnosis not present

## 2021-08-14 DIAGNOSIS — B353 Tinea pedis: Secondary | ICD-10-CM | POA: Diagnosis not present

## 2021-08-14 DIAGNOSIS — R600 Localized edema: Secondary | ICD-10-CM | POA: Diagnosis not present

## 2021-08-14 DIAGNOSIS — L309 Dermatitis, unspecified: Secondary | ICD-10-CM | POA: Diagnosis not present

## 2021-08-28 ENCOUNTER — Telehealth: Payer: Self-pay | Admitting: Family Medicine

## 2021-08-28 NOTE — Telephone Encounter (Signed)
[name] of [facility] called stating that pt has bad circulation in her legs, and was asking if she would need to make an appointment. Please advise.

## 2021-08-29 NOTE — Telephone Encounter (Signed)
Tried to call [name] from [facility] back but no answer and VM is not set up.

## 2021-08-30 NOTE — Telephone Encounter (Signed)
Patient has appt tomorrow with Dr. Einar Pheasant at 2:40 pm

## 2021-08-31 ENCOUNTER — Encounter: Payer: PPO | Attending: Physician Assistant | Admitting: Physician Assistant

## 2021-08-31 ENCOUNTER — Other Ambulatory Visit: Payer: Self-pay

## 2021-08-31 ENCOUNTER — Telehealth: Payer: Self-pay

## 2021-08-31 ENCOUNTER — Ambulatory Visit: Payer: PPO | Admitting: Family Medicine

## 2021-08-31 DIAGNOSIS — B354 Tinea corporis: Secondary | ICD-10-CM | POA: Diagnosis not present

## 2021-08-31 DIAGNOSIS — R21 Rash and other nonspecific skin eruption: Secondary | ICD-10-CM | POA: Diagnosis not present

## 2021-08-31 DIAGNOSIS — M170 Bilateral primary osteoarthritis of knee: Secondary | ICD-10-CM | POA: Insufficient documentation

## 2021-08-31 DIAGNOSIS — M797 Fibromyalgia: Secondary | ICD-10-CM | POA: Insufficient documentation

## 2021-08-31 DIAGNOSIS — I872 Venous insufficiency (chronic) (peripheral): Secondary | ICD-10-CM | POA: Insufficient documentation

## 2021-08-31 DIAGNOSIS — L97822 Non-pressure chronic ulcer of other part of left lower leg with fat layer exposed: Secondary | ICD-10-CM | POA: Insufficient documentation

## 2021-08-31 MED ORDER — FUROSEMIDE 40 MG PO TABS: 40.0000 mg | ORAL_TABLET | Freq: Every day | ORAL | 0 refills | Status: DC | PRN

## 2021-08-31 NOTE — Progress Notes (Addendum)
[name] ([id number]) Visit Report for 08/31/2021 Allergy List Details Patient Name: Dado, Marieann L. Date of Service: 08/31/2021 12:45 PM Medical Record Number: [id number] Patient Account Number: [id number] Date of Birth/Sex: 29-Jun-1941 (80 y.o. F) Treating RN: Dolan Amen Primary Care Lillyanne Bradburn: [name] Other Clinician: Referring Casper Pagliuca: Referral, Self Treating Maida Widger/Extender: Jeri Cos Weeks in Treatment: 0 Allergies Active Allergies sulfur Macrodantin Allergy Notes Electronic Signature(s) Signed: 08/31/2021 1:42:44 PM By: Dolan Amen RN Entered By: Dolan Amen on 08/31/2021 13:12:45 [name] ([id number]) -------------------------------------------------------------------------------- Arrival Information Details Patient Name: Janczak, Philomene L. Date of Service: 08/31/2021 12:45 PM Medical Record Number: [id number] Patient Account Number: [id number] Date of Birth/Sex: 1941-03-31 (80 y.o. F) Treating RN: Dolan Amen Primary Care Suzana Sohail: [name] Other Clinician: Referring Rayen Dafoe: Referral, Self Treating Kweku Stankey/Extender: Skipper Cliche in Treatment: 0 Visit Information Patient Arrived: Walker Arrival Time: 13:03 Accompanied By: daughter Transfer Assistance: None Patient Identification Verified: Yes Secondary Verification Process Completed: Yes Electronic Signature(s) Signed: 08/31/2021 1:42:44 PM By: Dolan Amen RN Entered By: Dolan Amen on 08/31/2021 13:03:45 [name] ([id number]) -------------------------------------------------------------------------------- Clinic Level of Care Assessment Details Patient Name: Khatoon, Lether L. Date of Service: 08/31/2021 12:45 PM Medical Record Number: [id number] Patient Account Number: [id number] Date of Birth/Sex: 11-29-1940 (80 y.o. F) Treating RN: Dolan Amen Primary Care Tekila Caillouet: [name] Other Clinician: Referring Shekera Beavers: Referral,  Self Treating Andru Genter/Extender: Skipper Cliche in Treatment: 0 Clinic Level of Care Assessment Items TOOL 1 Quantity Score X - Use when EandM and Procedure is performed on INITIAL visit 1 0 ASSESSMENTS - Nursing Assessment / Reassessment X - General Physical Exam (combine w/ comprehensive assessment (listed just below) when performed on new 1 20 pt. evals) X- 1 25 Comprehensive Assessment (HX, ROS, Risk Assessments, Wounds Hx, etc.) ASSESSMENTS - Wound and Skin Assessment / Reassessment X - Dermatologic / Skin Assessment (not related to wound area) 1 10 ASSESSMENTS - Ostomy and/or Continence Assessment and Care []  - Incontinence Assessment and Management 0 []  - 0 Ostomy Care Assessment and Management (repouching, etc.) PROCESS - Coordination of Care X - Simple Patient / Family Education for ongoing care 1 15 []  - 0 Complex (extensive) Patient / Family Education for ongoing care X- 1 10 Staff obtains Programmer, systems, Records, Test Results / Process Orders []  - 0 Staff telephones HHA, Nursing Homes / Clarify orders / etc []  - 0 Routine Transfer to another Facility (non-emergent condition) []  - 0 Routine Hospital Admission (non-emergent condition) X- 1 15 New Admissions / Biomedical engineer / Ordering NPWT, Apligraf, etc. []  - 0 Emergency Hospital Admission (emergent condition) PROCESS - Special Needs []  - Pediatric / Minor Patient Management 0 []  - 0 Isolation Patient Management []  - 0 Hearing / Language / Visual special needs []  - 0 Assessment of Community assistance (transportation, D/C planning, etc.) []  - 0 Additional assistance / Altered mentation []  - 0 Support Surface(s) Assessment (bed, cushion, seat, etc.) INTERVENTIONS - Miscellaneous []  - External ear exam 0 []  - 0 Patient Transfer (multiple staff / Civil Service fast streamer / Similar devices) []  - 0 Simple Staple / Suture removal (25 or less) []  - 0 Complex Staple / Suture removal (26 or more) []  -  0 Hypo/Hyperglycemic Management (do not check if billed separately) X- 1 15 Ankle / Brachial Index (ABI) - do not check if billed separately Has the patient been seen at the hospital within the last three years: Yes Total Score: 110 Level Of Care: New/Established - Level 3 Badon, Lunell L. ([id number]) Electronic  of reason and process for debridement of necrotic tissue Date Initiated: 08/31/2021 Target Resolution Date: 08/31/2021 Goal Status: Active Interventions: Assess patient pain level pre-, during and post procedure and prior to discharge Provide education on necrotic tissue and debridement process Treatment Activities: Apply topical anesthetic as ordered : 08/31/2021 Biologic debridement : 08/31/2021 Enzymatic debridement : 08/31/2021 Excisional debridement : 08/31/2021 Notes: Orientation to the Wound Care Program Nursing Diagnoses: Knowledge deficit related to the wound healing center program Goals: Patient/caregiver will verbalize understanding of the [place] Program Date Initiated: 08/31/2021 Target Resolution Date: 08/31/2021 Goal Status: Active Interventions: Provide education on  orientation to the wound center Notes: Wound/Skin Impairment Nursing Diagnoses: Impaired tissue integrity Goals: Patient/caregiver will verbalize understanding of skin care regimen Date Initiated: 08/31/2021 Target Resolution Date: 08/31/2021 Goal Status: Active Ulcer/skin breakdown will have a volume reduction of 30% by week 4 Date Initiated: 08/31/2021 Target Resolution Date: 10/01/2021 Goal Status: Active Ulcer/skin breakdown will have a volume reduction of 50% by week 8 Date Initiated: 08/31/2021 Target Resolution Date: 10/31/2021 [name] ([id number]) Goal Status: Active Ulcer/skin breakdown will have a volume reduction of 80% by week 12 Date Initiated: 08/31/2021 Target Resolution Date: 12/01/2021 Goal Status: Active Ulcer/skin breakdown will heal within 14 weeks Date Initiated: 08/31/2021 Target Resolution Date: 01/01/2022 Goal Status: Active Interventions: Assess patient/caregiver ability to obtain necessary supplies Assess patient/caregiver ability to perform ulcer/skin care regimen upon admission and as needed Assess ulceration(s) every visit Provide education on ulcer and skin care Treatment Activities: Referred to DME Kavion Mancinas for dressing supplies : 08/31/2021 Skin care regimen initiated : 08/31/2021 Notes: Electronic Signature(s) Signed: 08/31/2021 5:01:39 PM By: Dolan Amen RN Entered By: Dolan Amen on 08/31/2021 13:53:51 [place], Lillette [name] ([id number]) -------------------------------------------------------------------------------- Pain Assessment Details Patient Name: Weekley, Arynn L. Date of Service: 08/31/2021 12:45 PM Medical Record Number: [id number] Patient Account Number: [id number] Date of Birth/Sex: 05-14-1941 (80 y.o. F) Treating RN: Dolan Amen Primary Care Harrol Novello: [name] Other Clinician: Referring Namine Beahm: Referral, Self Treating Malekai Markwood/Extender: Skipper Cliche in Treatment: 0 Active Problems Location of Pain  Severity and Description of Pain Patient Has Paino No Site Locations Rate the pain. Current Pain Level: 0 Pain Management and Medication Current Pain Management: Electronic Signature(s) Signed: 08/31/2021 1:42:44 PM By: Dolan Amen RN Entered By: Dolan Amen on 08/31/2021 13:04:22 [place], Lillette [name] ([id number]) -------------------------------------------------------------------------------- Patient/Caregiver Education Details Patient Name: Castelli, Florestine L. Date of Service: 08/31/2021 12:45 PM Medical Record Number: [id number] Patient Account Number: [id number] Date of Birth/Gender: Jul 01, 1941 (80 y.o. F) Treating RN: Dolan Amen Primary Care Physician: [name] Other Clinician: Referring Physician: Referral, Self Treating Physician/Extender: Skipper Cliche in Treatment: 0 Education Assessment Education Provided To: Patient Education Topics Provided Welcome To [facility]: Methods: Explain/Verbal Responses: State content correctly Wound Debridement: Methods: Explain/Verbal Responses: State content correctly Wound/Skin Impairment: Methods: Explain/Verbal Responses: State content correctly Electronic Signature(s) Signed: 08/31/2021 5:01:39 PM By: Dolan Amen RN Entered By: Dolan Amen on 08/31/2021 14:24:14 [place], Lillette [name] ([id number]) -------------------------------------------------------------------------------- Wound Assessment Details Patient Name: Sidell, Erlean L. Date of Service: 08/31/2021 12:45 PM Medical Record Number: [id number] Patient Account Number: [id number] Date of Birth/Sex: May 07, 1941 (80 y.o. F) Treating RN: Dolan Amen Primary Care Javaun Dimperio: [name] Other Clinician: Referring Maxim Bedel: Referral, Self Treating Safiya Girdler/Extender: Skipper Cliche in Treatment: 0 Wound Status Wound Number: 1 Primary Etiology: Infection - not elsewhere classified Wound Location: Left, Anterior Lower Leg Wound Status:  Open Wounding Event: Skin Tear/Laceration Comorbid History: Cataracts, Osteoarthritis, Received Radiation Date Acquired: 07/30/2021  of reason and process for debridement of necrotic tissue Date Initiated: 08/31/2021 Target Resolution Date: 08/31/2021 Goal Status: Active Interventions: Assess patient pain level pre-, during and post procedure and prior to discharge Provide education on necrotic tissue and debridement process Treatment Activities: Apply topical anesthetic as ordered : 08/31/2021 Biologic debridement : 08/31/2021 Enzymatic debridement : 08/31/2021 Excisional debridement : 08/31/2021 Notes: Orientation to the Wound Care Program Nursing Diagnoses: Knowledge deficit related to the wound healing center program Goals: Patient/caregiver will verbalize understanding of the [place] Program Date Initiated: 08/31/2021 Target Resolution Date: 08/31/2021 Goal Status: Active Interventions: Provide education on  orientation to the wound center Notes: Wound/Skin Impairment Nursing Diagnoses: Impaired tissue integrity Goals: Patient/caregiver will verbalize understanding of skin care regimen Date Initiated: 08/31/2021 Target Resolution Date: 08/31/2021 Goal Status: Active Ulcer/skin breakdown will have a volume reduction of 30% by week 4 Date Initiated: 08/31/2021 Target Resolution Date: 10/01/2021 Goal Status: Active Ulcer/skin breakdown will have a volume reduction of 50% by week 8 Date Initiated: 08/31/2021 Target Resolution Date: 10/31/2021 [name] ([id number]) Goal Status: Active Ulcer/skin breakdown will have a volume reduction of 80% by week 12 Date Initiated: 08/31/2021 Target Resolution Date: 12/01/2021 Goal Status: Active Ulcer/skin breakdown will heal within 14 weeks Date Initiated: 08/31/2021 Target Resolution Date: 01/01/2022 Goal Status: Active Interventions: Assess patient/caregiver ability to obtain necessary supplies Assess patient/caregiver ability to perform ulcer/skin care regimen upon admission and as needed Assess ulceration(s) every visit Provide education on ulcer and skin care Treatment Activities: Referred to DME Kavion Mancinas for dressing supplies : 08/31/2021 Skin care regimen initiated : 08/31/2021 Notes: Electronic Signature(s) Signed: 08/31/2021 5:01:39 PM By: Dolan Amen RN Entered By: Dolan Amen on 08/31/2021 13:53:51 [place], Lillette [name] ([id number]) -------------------------------------------------------------------------------- Pain Assessment Details Patient Name: Weekley, Arynn L. Date of Service: 08/31/2021 12:45 PM Medical Record Number: [id number] Patient Account Number: [id number] Date of Birth/Sex: 05-14-1941 (80 y.o. F) Treating RN: Dolan Amen Primary Care Harrol Novello: [name] Other Clinician: Referring Namine Beahm: Referral, Self Treating Malekai Markwood/Extender: Skipper Cliche in Treatment: 0 Active Problems Location of Pain  Severity and Description of Pain Patient Has Paino No Site Locations Rate the pain. Current Pain Level: 0 Pain Management and Medication Current Pain Management: Electronic Signature(s) Signed: 08/31/2021 1:42:44 PM By: Dolan Amen RN Entered By: Dolan Amen on 08/31/2021 13:04:22 [place], Lillette [name] ([id number]) -------------------------------------------------------------------------------- Patient/Caregiver Education Details Patient Name: Castelli, Florestine L. Date of Service: 08/31/2021 12:45 PM Medical Record Number: [id number] Patient Account Number: [id number] Date of Birth/Gender: Jul 01, 1941 (80 y.o. F) Treating RN: Dolan Amen Primary Care Physician: [name] Other Clinician: Referring Physician: Referral, Self Treating Physician/Extender: Skipper Cliche in Treatment: 0 Education Assessment Education Provided To: Patient Education Topics Provided Welcome To [facility] [place]: Methods: Explain/Verbal Responses: State content correctly Wound Debridement: Methods: Explain/Verbal Responses: State content correctly Wound/Skin Impairment: Methods: Explain/Verbal Responses: State content correctly Electronic Signature(s) Signed: 08/31/2021 5:01:39 PM By: Dolan Amen RN Entered By: Dolan Amen on 08/31/2021 14:24:14 [place], Lillette [name] ([id number]) -------------------------------------------------------------------------------- Wound Assessment Details Patient Name: Sidell, Erlean L. Date of Service: 08/31/2021 12:45 PM Medical Record Number: [id number] Patient Account Number: [id number] Date of Birth/Sex: May 07, 1941 (80 y.o. F) Treating RN: Dolan Amen Primary Care Javaun Dimperio: [name] Other Clinician: Referring Maxim Bedel: Referral, Self Treating Safiya Girdler/Extender: Skipper Cliche in Treatment: 0 Wound Status Wound Number: 1 Primary Etiology: Infection - not elsewhere classified Wound Location: Left, Anterior Lower Leg Wound Status:  Open Wounding Event: Skin Tear/Laceration Comorbid History: Cataracts, Osteoarthritis, Received Radiation Date Acquired: 07/30/2021  Weeks Of Treatment: 0 Clustered Wound: No Photos Wound Measurements Length: (cm) 3.2 Width: (cm) 0.5 Depth: (cm) 0.5 Area: (cm) 1.257 Volume: (cm) 0.628 % Reduction in Area: % Reduction in Volume: Epithelialization: Small (1-33%) Tunneling: No Undermining: No Wound Description Classification: Full Thickness Without Exposed Support Structu Wound Margin: Flat and Intact Exudate Amount: Medium Exudate Type: Serosanguineous Exudate Color: red, brown res Foul Odor After Cleansing: No Slough/Fibrino Yes Wound Bed Granulation Amount: Small (1-33%) Exposed Structure Granulation Quality: Red Fascia Exposed: No Necrotic Amount: Large (67-100%) Fat Layer (Subcutaneous Tissue) Exposed: Yes Necrotic Quality: Adherent Slough Tendon Exposed: No Muscle Exposed: No Joint Exposed: No Bone Exposed: No Electronic Signature(s) Signed: 08/31/2021 1:42:44 PM By: Dolan Amen RN Entered By: Dolan Amen on 08/31/2021 13:24:48 Perezgarcia, Lillette [name] ([id number]) -------------------------------------------------------------------------------- Vitals Details Patient Name: Srinivasan, Emmelyn L. Date of Service: 08/31/2021 12:45 PM Medical Record Number: [id number] Patient Account Number: [id number] Date of Birth/Sex: [birth year] (80 y.o. F) Treating RN: Dolan Amen Primary Care Salwa Bai: [name] Other Clinician: Referring Kailon Treese: Referral, Self Treating Zabdi Mis/Extender: Skipper Cliche in Treatment: 0 Vital Signs Time Taken: 13:05 Temperature (F): 98.1 Height (in): 63 Pulse (bpm): 79 Source: Stated Respiratory Rate (breaths/min): 18 Weight (lbs): 198 Blood Pressure (mmHg): 184/92 Source: Measured Reference Range: 80 - 120 mg / dl Body Mass Index (BMI): 35.1 Electronic Signature(s) Signed: 08/31/2021 1:42:44 PM By: Dolan Amen  RN Entered By: Dolan Amen on 08/31/2021 13:10:42

## 2021-08-31 NOTE — Telephone Encounter (Signed)
Rx sent.  Use daily if needed.  If no improvement in edema after a few days, then stop med and let me know.  Thanks.

## 2021-08-31 NOTE — Addendum Note (Signed)
Addended by: Tonia Ghent on: 08/31/2021 03:19 PM   Modules accepted: Orders
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
## 2021-08-31 NOTE — Telephone Encounter (Signed)
Patient aware rx was sent. Patient has f/u appt with wound clinic 09/04/21. Advised patient to let us know if gets no better in a few days and to stop the med. Patient verbalized understanding.

## 2021-09-01 NOTE — Progress Notes (Signed)
MAELYNN, MORONEY ([id number]) Visit Report for 08/31/2021 Chief Complaint Document Details Patient Name: [name], [name] Date of Service: 08/31/2021 12:45 PM Medical Record Number: [id number] Patient Account Number: [id number] Date of Birth/Sex: 06-24-1941 (80 y.o. F) Treating RN: Dolan Amen Primary Care Provider: [name] Other Clinician: Referring Provider: Referral, Self Treating Provider/Extender: Skipper Cliche in Treatment: 0 Information Obtained from: Patient Chief Complaint Left LE Ulcer Electronic Signature(s) Signed: 08/31/2021 1:50:42 PM By: Worthy Keeler PA-C Entered By: Worthy Keeler on 08/31/2021 13:50:42 [name], [name] ([id number]) -------------------------------------------------------------------------------- HPI Details Patient Name: Beissel, Cortni L. Date of Service: 08/31/2021 12:45 PM Medical Record Number: [id number] Patient Account Number: [id number] Date of Birth/Sex: 08-04-1941 (80 y.o. F) Treating RN: Dolan Amen Primary Care Provider: [name] Other Clinician: Referring Provider: Referral, Self Treating Provider/Extender: Skipper Cliche in Treatment: 0 History of Present Illness HPI Description: 08/31/2021 upon evaluation today patient presents for initial inspection here in our clinic concerning issues that she is having with a wound on her left anterior lower extremity. This is actually an area that she unfortunately tells me she was picking up a cage and dropped hitting the anterior portion of her shin causing a skin tear. She did not go to the hospital due to the fact that she did not realize that she would need to. This happened on July 30, 2021. With that being said the patient has been on Augmentin she has about 1 and half days of this left. Nonetheless she tells me that she does have a longstanding history of leg swelling. She does have chronic venous insufficiency as well it appears to me. She also has some  evidence of may be early stages of lymphedema. She has a history of fibromyalgia, bilateral knee osteoarthritis, and appears to have a fungal infection of the right ankle region. In regard to the arthritis she tells me her legs tend to swell less after she gets her knee injections allowing her to be able to walk and get around much more effectively. Following the injections she does well for a while and then when that seems to be wearing off she tends to start having issues with more swelling because she is more mobile that makes sense from a venous standpoint. Electronic Signature(s) Signed: 09/01/2021 9:10:08 AM By: Worthy Keeler PA-C Entered By: Worthy Keeler on 09/01/2021 09:10:08 [name], [name] ([id number]) -------------------------------------------------------------------------------- Physical Exam Details Patient Name: Schifano, Antionette L. Date of Service: 08/31/2021 12:45 PM Medical Record Number: [id number] Patient Account Number: [id number] Date of Birth/Sex: 1940-11-29 (80 y.o. F) Treating RN: Dolan Amen Primary Care Provider: [name] Other Clinician: Referring Provider: Referral, Self Treating Provider/Extender: Skipper Cliche in Treatment: 0 Constitutional patient is hypertensive.. pulse regular and within target range for patient.Marland Kitchen respirations regular, non-labored and within target range for patient.Marland Kitchen temperature within target range for patient.. Well-nourished and well-hydrated in no acute distress. Eyes conjunctiva clear no eyelid edema noted. pupils equal round and reactive to light and accommodation. Ears, Nose, Mouth, and Throat no gross abnormality of ear auricles or external auditory canals. normal hearing noted during conversation. mucus membranes moist. Respiratory normal breathing without difficulty. Cardiovascular 2+ dorsalis pedis/posterior tibialis pulses. 2+ pitting edema of the bilateral lower extremities. Musculoskeletal Patient unable  to walk without assistance. no significant deformity or arthritic changes, no loss or range of motion, no clubbing. Psychiatric this patient is able to make decisions and demonstrates good insight into disease process. Alert and Oriented x 3. pleasant and cooperative.  Notes Upon inspection patient's wound bed actually showed signs of having some necrotic tissue on the surface of the wound. Fortunately there does not appear to be any evidence in my opinion of significant infection which is good news. With that being said I do think that she would benefit from compression therapy. The biggest issue that I see right now is that her arterial flow is noncompressible and again there is no confirmation especially in light of the fact that she does have capillary refill that is greater than 3 seconds that she has sufficient flow to support compression wrapping with a full 3 or 4 layer compression wrap. I think we could do an Unna boot wrap and this could be somewhat beneficial for the patient. Electronic Signature(s) Signed: 09/01/2021 9:12:43 AM By: Worthy Keeler PA-C Entered By: Worthy Keeler on 09/01/2021 09:12:43 [name], [name] ([id number]) -------------------------------------------------------------------------------- Physician Orders Details Patient Name: [name], [name] Date of Service: 08/31/2021 12:45 PM Medical Record Number: [id number] Patient Account Number: [id number] Date of Birth/Sex: 07-21-1941 (80 y.o. F) Treating RN: Dolan Amen Primary Care Provider: [name] Other Clinician: Referring Provider: Referral, Self Treating Provider/Extender: Skipper Cliche in Treatment: 0 Verbal / Phone Orders: No Diagnosis Coding ICD-10 Coding Code Description I87.2 Venous insufficiency (chronic) (peripheral) L97.822 Non-pressure chronic ulcer of other part of left lower leg with fat layer exposed M79.7 Fibromyalgia M17.0 Bilateral primary osteoarthritis of knee Follow-up  Appointments o Return Appointment in 2 weeks. o Nurse Visit as needed - Twice a week Bathing/ Shower/ Hygiene o May shower with wound dressing protected with water repellent cover or cast protector. o No tub bath. Edema Control - Lymphedema / Segmental Compressive Device / Other o Elevate, Exercise Daily and Avoid Standing for Long Periods of Time. o Elevate legs to the level of the heart and pump ankles as often as possible o Elevate leg(s) parallel to the floor when sitting. Wound Treatment Wound #1 - Lower Leg Wound Laterality: Left, Anterior Cleanser: Soap and Water 3 x Per Week/30 Days Discharge Instructions: Gently cleanse wound with antibacterial soap, rinse and pat dry prior to dressing wounds Primary Dressing: Iodosorb 40 (g) 3 x Per Week/30 Days Discharge Instructions: Apply IodoSorb to wound bed only as directed. Secondary Dressing: ABD Pad 5x9 (in/in) 3 x Per Week/30 Days Discharge Instructions: Cover with ABD pad Compression Wrap: Unna Boot 4x10 (in/yd) 3 x Per Week/30 Days Discharge Instructions: Unna Paste, Kerlix and Coban from base of toes to three finger widths below bend in knee. Patient Medications Allergies: sulfur, Macrodantin Notifications Medication Indication Start End Lotrisone 08/31/2021 DOSE topical 1 %-0.05 % cream - cream topical applied to the right ankle region over the rash 2 times per day for 2 weeks Electronic Signature(s) Signed: 08/31/2021 2:27:12 PM By: Worthy Keeler PA-C Entered By: Worthy Keeler on 08/31/2021 14:27:12 Terpstra, [name] ([id number]) -------------------------------------------------------------------------------- Problem List Details Patient Name: [name], [name] Date of Service: 08/31/2021 12:45 PM Medical Record Number: [id number] Patient Account Number: [id number] Date of Birth/Sex: 08/17/1941 (80 y.o. F) Treating RN: Dolan Amen Primary Care Provider: [name] Other Clinician: Referring  Provider: Referral, Self Treating Provider/Extender: Skipper Cliche in Treatment: 0 Active Problems ICD-10 Encounter Code Description Active Date MDM Diagnosis I87.2 Venous insufficiency (chronic) (peripheral) 08/31/2021 No Yes L97.822 Non-pressure chronic ulcer of other part of left lower leg with fat layer 08/31/2021 No Yes exposed M79.7 Fibromyalgia 08/31/2021 No Yes M17.0 Bilateral primary osteoarthritis of knee 08/31/2021 No Yes B35.4 Tinea corporis 08/31/2021  No Yes Inactive Problems Resolved Problems Electronic Signature(s) Signed: 08/31/2021 2:27:56 PM By: Worthy Keeler PA-C Previous Signature: 08/31/2021 1:50:31 PM Version By: Worthy Keeler PA-C Entered By: Worthy Keeler on 08/31/2021 14:27:55 Swager, [name] ([id number]) -------------------------------------------------------------------------------- Progress Note Details Patient Name: [name], [name] Date of Service: 08/31/2021 12:45 PM Medical Record Number: [id number] Patient Account Number: [id number] Date of Birth/Sex: [birth year] (80 y.o. F) Treating RN: Dolan Amen Primary Care Provider: [name] Other Clinician: Referring Provider: Referral, Self Treating Provider/Extender: Skipper Cliche in Treatment: 0 Subjective Chief Complaint Information obtained from Patient Left LE Ulcer History of Present Illness (HPI) 08/31/2021 upon evaluation today patient presents for initial inspection here in our clinic concerning issues that she is having with a wound on her left anterior lower extremity. This is actually an area that she unfortunately tells me she was picking up a cage and dropped hitting the anterior portion of her shin causing a skin tear. She did not go to the hospital due to the fact that she did not realize that she would need to. This happened on July 30, 2021. With that being said the patient has been on Augmentin she has about 1 and half days of this left. Nonetheless she tells me  that she does have a longstanding history of leg swelling. She does have chronic venous insufficiency as well it appears to me. She also has some evidence of may be early stages of lymphedema. She has a history of fibromyalgia, bilateral knee osteoarthritis, and appears to have a fungal infection of the right ankle region. In regard to the arthritis she tells me her legs tend to swell less after she gets her knee injections allowing her to be able to walk and get around much more effectively. Following the injections she does well for a while and then when that seems to be wearing off she tends to start having issues with more swelling because she is more mobile that makes sense from a venous standpoint. Patient History Information obtained from Patient. Allergies sulfur, Macrodantin Social History Never smoker. Medical History Eyes Patient has history of Cataracts Musculoskeletal Patient has history of Osteoarthritis Oncologic Patient has history of Received Radiation Medical And Surgical History Notes Eyes macular degeneration Oncologic Lumpectomy-about 20 years ago Review of Systems (ROS) Constitutional Symptoms ([place]) Denies complaints or symptoms of Fatigue, Fever, Chills, Marked Weight Change. Eyes Denies complaints or symptoms of Dry Eyes, Vision Changes, Glasses / Contacts. Ear/Nose/Mouth/Throat Denies complaints or symptoms of Difficult clearing ears, Sinusitis, tinnitis Hematologic/Lymphatic Denies complaints or symptoms of Bleeding / Clotting Disorders, Human Immunodeficiency Virus. Respiratory Denies complaints or symptoms of Chronic or frequent coughs, Shortness of Breath. Cardiovascular Complains or has symptoms of LE edema. Denies complaints or symptoms of Chest pain. Gastrointestinal Denies complaints or symptoms of Frequent diarrhea, Nausea, Vomiting. Endocrine Denies complaints or symptoms of Hepatitis, Thyroid disease, Polydypsia (Excessive  Thirst). Genitourinary Denies complaints or symptoms of Kidney failure/ Dialysis, Incontinence/dribbling. Immunological Denies complaints or symptoms of Hives, Itching. Integumentary (Skin) Complains or has symptoms of Swelling. Denies complaints or symptoms of Wounds, Bleeding or bruising tendency, Breakdown. [name], Alpha ([id number]) Musculoskeletal Denies complaints or symptoms of Muscle Pain, Muscle Weakness. Neurologic Denies complaints or symptoms of Numbness/parasthesias, Focal/Weakness. Psychiatric Denies complaints or symptoms of Anxiety, Claustrophobia. Objective Constitutional patient is hypertensive.. pulse regular and within target range for patient.Marland Kitchen respirations regular, non-labored and within target range for patient.Marland Kitchen temperature within target range for patient.. Well-nourished and well-hydrated in no acute distress.  Vitals Time Taken: 1:05 PM, Height: 63 in, Source: Stated, Weight: 198 lbs, Source: Measured, BMI: 35.1, Temperature: 98.1 F, Pulse: 79 bpm, Respiratory Rate: 18 breaths/min, Blood Pressure: 184/92 mmHg. Eyes conjunctiva clear no eyelid edema noted. pupils equal round and reactive to light and accommodation. Ears, Nose, Mouth, and Throat no gross abnormality of ear auricles or external auditory canals. normal hearing noted during conversation. mucus membranes moist. Respiratory normal breathing without difficulty. Cardiovascular 2+ dorsalis pedis/posterior tibialis pulses. 2+ pitting edema of the bilateral lower extremities. Musculoskeletal Patient unable to walk without assistance. no significant deformity or arthritic changes, no loss or range of motion, no clubbing. Psychiatric this patient is able to make decisions and demonstrates good insight into disease process. Alert and Oriented x 3. pleasant and cooperative. General Notes: Upon inspection patient's wound bed actually showed signs of having some necrotic tissue on the surface of the  wound. Fortunately there does not appear to be any evidence in my opinion of significant infection which is good news. With that being said I do think that she would benefit from compression therapy. The biggest issue that I see right now is that her arterial flow is noncompressible and again there is no confirmation especially in light of the fact that she does have capillary refill that is greater than 3 seconds that she has sufficient flow to support compression wrapping with a full 3 or 4 layer compression wrap. I think we could do an Unna boot wrap and this could be somewhat beneficial for the patient. Integumentary (Hair, Skin) Wound #1 status is Open. Original cause of wound was Skin Tear/Laceration. The date acquired was: 07/30/2021. The wound is located on the Left,Anterior Lower Leg. The wound measures 3.2cm length x 0.5cm width x 0.5cm depth; 1.257cm^2 area and 0.628cm^3 volume. There is Fat Layer (Subcutaneous Tissue) exposed. There is no tunneling or undermining noted. There is a medium amount of serosanguineous drainage noted. The wound margin is flat and intact. There is small (1-33%) red granulation within the wound bed. There is a large (67-100%) amount of necrotic tissue within the wound bed including Adherent Slough. Assessment Active Problems ICD-10 Venous insufficiency (chronic) (peripheral) Non-pressure chronic ulcer of other part of left lower leg with fat layer exposed Fibromyalgia Bilateral primary osteoarthritis of knee Tinea corporis [name], [name] ([id number]) Procedures Wound #1 Pre-procedure diagnosis of Wound #1 is a Venous Leg Ulcer located on the Left,Anterior Lower Leg . There was a Haematologist Compression Therapy Procedure by Dolan Amen, RN. Post procedure Diagnosis Wound #1: Same as Pre-Procedure Plan Follow-up Appointments: Return Appointment in 2 weeks. Nurse Visit as needed - Twice a week Bathing/ Shower/ Hygiene: May shower with wound  dressing protected with water repellent cover or cast protector. No tub bath. Edema Control - Lymphedema / Segmental Compressive Device / Other: Elevate, Exercise Daily and Avoid Standing for Long Periods of Time. Elevate legs to the level of the heart and pump ankles as often as possible Elevate leg(s) parallel to the floor when sitting. The following medication(s) was prescribed: Lotrisone topical 1 %-0.05 % cream cream topical applied to the right ankle region over the rash 2 times per day for 2 weeks starting 08/31/2021 WOUND #1: - Lower Leg Wound Laterality: Left, Anterior Cleanser: Soap and Water 3 x Per Week/30 Days Discharge Instructions: Gently cleanse wound with antibacterial soap, rinse and pat dry prior to dressing wounds Primary Dressing: Iodosorb 40 (g) 3 x Per Week/30 Days Discharge Instructions: Apply IodoSorb to wound bed  only as directed. Secondary Dressing: ABD Pad 5x9 (in/in) 3 x Per Week/30 Days Discharge Instructions: Cover with ABD pad Compression Wrap: Unna Boot 4x10 (in/yd) 3 x Per Week/30 Days Discharge Instructions: Unna Paste, Kerlix and Coban from base of toes to three finger widths below bend in knee. 1. I am also going to suggest based on what I am seeing currently that we use Iodosorb as the treatment of choice to try to help clean up the surface of the wound. 2. I am also can recommend that we initiate treatment with an Unna boot this will not be too tight on her but will help with some of the swelling and edema. Obviously be better to do a different wrap but nonetheless we do what we can for now. I do believe that an arterial study would be good for her but she does not want to proceed with that yet. Her daughter was a little concerned about all that as well and was questioning whether or not she could just have a fluid pill, elevate her legs, and try to get this healed without going that route. 3. Based on the discussion above I did actually contact Dr.  Tonia Ghent office to inquire as to whether or not they felt like a fluid pill would be beneficial for the patient. Subsequently I had to leave a message though I did speak with Janett Billow who is his medical assistant I believe or nurse I am not sure which. Nonetheless she was given get a message to him as Dr. Damita Dunnings was out of the office and should be back tomorrow. Nonetheless I think that a fluid pill such as Lasix could help her. We will see patient back for reevaluation in 1 week here in the clinic. If anything worsens or changes patient will contact our office for additional recommendations. Electronic Signature(s) Signed: 09/01/2021 9:14:33 AM By: Worthy Keeler PA-C Entered By: Worthy Keeler on 09/01/2021 09:14:32 [name], [name] ([id number]) -------------------------------------------------------------------------------- ROS/PFSH Details Patient Name: [name], [name] Date of Service: 08/31/2021 12:45 PM Medical Record Number: [id number] Patient Account Number: [id number] Date of Birth/Sex: [birth year] (80 y.o. F) Treating RN: Dolan Amen Primary Care Provider: [name] Other Clinician: Referring Provider: Referral, Self Treating Provider/Extender: Skipper Cliche in Treatment: 0 Information Obtained From Patient Constitutional Symptoms (General Health) Complaints and Symptoms: Negative for: Fatigue; Fever; Chills; Marked Weight Change Eyes Complaints and Symptoms: Negative for: Dry Eyes; Vision Changes; Glasses / Contacts Medical History: Positive for: Cataracts Past Medical History Notes: macular degeneration Ear/Nose/Mouth/Throat Complaints and Symptoms: Negative for: Difficult clearing ears; Sinusitis Review of System Notes: tinnitis Hematologic/Lymphatic Complaints and Symptoms: Negative for: Bleeding / Clotting Disorders; Human Immunodeficiency Virus Respiratory Complaints and Symptoms: Negative for: Chronic or frequent coughs; Shortness of  Breath Cardiovascular Complaints and Symptoms: Positive for: LE edema Negative for: Chest pain Gastrointestinal Complaints and Symptoms: Negative for: Frequent diarrhea; Nausea; Vomiting Endocrine Complaints and Symptoms: Negative for: Hepatitis; Thyroid disease; Polydypsia (Excessive Thirst) Genitourinary Complaints and Symptoms: Negative for: Kidney failure/ Dialysis; Incontinence/dribbling Immunological Lanius, [name] ([id number]) Complaints and Symptoms: Negative for: Hives; Itching Integumentary (Skin) Complaints and Symptoms: Positive for: Swelling Negative for: Wounds; Bleeding or bruising tendency; Breakdown Musculoskeletal Complaints and Symptoms: Negative for: Muscle Pain; Muscle Weakness Medical History: Positive for: Osteoarthritis Neurologic Complaints and Symptoms: Negative for: Numbness/parasthesias; Focal/Weakness Psychiatric Complaints and Symptoms: Negative for: Anxiety; Claustrophobia Oncologic Medical History: Positive for: Received Radiation Past Medical History Notes: Lumpectomy-about 20 years ago HBO Extended History Items Eyes: Cataracts Immunizations  Pneumococcal Vaccine: Received Pneumococcal Vaccination: Yes Received Pneumococcal Vaccination On or After 60th Birthday: Yes Implantable Devices None Family and Social History 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 Date of Service: 08/31/2021 Medical Record Number: [id number] Patient Account Number: [id number] Date of Birth/Sex: Apr 18, 1941 (80 y.o. F) Treating RN: Dolan Amen Primary Care Provider: [name] Other Clinician: Referring Provider: Referral,  Self Treating Provider/Extender: Skipper Cliche in Treatment: 0 Diagnosis Coding ICD-10 Codes Code Description I87.2 Venous insufficiency (chronic) (peripheral) L97.822 Non-pressure chronic ulcer of other part of left lower leg with fat layer exposed M79.7 Fibromyalgia M17.0 Bilateral primary osteoarthritis of knee Facility Procedures CPT4 Code: 91638466 Description: 59935 - WOUND CARE VISIT-LEV 3 EST PT Modifier: Quantity: 1 CPT4 Code: 70177939 Description: (Facility Use Only) 29581LT - [place] LWR LT LEG Modifier: Quantity: 1 Physician Procedures CPT4 Code: 0300923 Description: 30076 - WC PHYS LEVEL 4 - NEW PT Modifier: Quantity: 1 CPT4 Code: Description: ICD-10 Diagnosis Description I87.2 Venous insufficiency (chronic) (peripheral) L97.822 Non-pressure chronic ulcer of other part of left lower leg with fat laye M79.7 Fibromyalgia M17.0 Bilateral primary osteoarthritis of knee Modifier: r exposed Quantity: Electronic Signature(s) Signed: 09/01/2021 9:19:11 AM By: Worthy Keeler PA-C Previous Signature: 08/31/2021 5:01:39 PM Version By: Dolan Amen RN Entered By: Worthy Keeler on 09/01/2021 09:19:11

## 2021-09-04 ENCOUNTER — Encounter: Payer: PPO | Admitting: Internal Medicine

## 2021-09-04 ENCOUNTER — Other Ambulatory Visit: Payer: Self-pay

## 2021-09-04 DIAGNOSIS — I872 Venous insufficiency (chronic) (peripheral): Secondary | ICD-10-CM | POA: Diagnosis not present

## 2021-09-04 DIAGNOSIS — L97822 Non-pressure chronic ulcer of other part of left lower leg with fat layer exposed: Secondary | ICD-10-CM | POA: Diagnosis not present

## 2021-09-05 ENCOUNTER — Telehealth: Payer: Self-pay | Admitting: Family Medicine

## 2021-09-05 NOTE — Telephone Encounter (Signed)
Spoke to patient's daughter Neoma Laming and was advised that she started with a rash shortly after starting the Lasix. Neoma Laming stated that her mom continued to take the Lasix even with the rash and itching. Neoma Laming stated that the rash is almost on her whole body. Neoma Laming stated that her mom has been going to the  and was advised by the doctor there to take Benadryl 1/2 daily but her mom has been taking 1/2 twice a day. Patient's daughter stated that her mom has not had any SOB or difficulty breathing. Basilio [place] to have her mom stop the lasix and drink lots of water to try and flush it out of her symptom. Neoma Laming stated the doctor at the wound center stated that the Lasix has a little Sulfa in it and some people are allergic to that. Patient's daughter was given ER precautions and she verbalized understanding.  Pharmacy [facility]

## 2021-09-05 NOTE — Telephone Encounter (Signed)
Pt daughter called stating that pt had an allergic reaction to medication  furosemide (LASIX) 40 MG tablet.Pt daughter wanting Dr Damita Dunnings to call in a different fluid pill ,something without sulfur.

## 2021-09-05 NOTE — Telephone Encounter (Signed)
Please triage patient and set up OV.

## 2021-09-06 ENCOUNTER — Other Ambulatory Visit: Payer: Self-pay | Admitting: Family Medicine

## 2021-09-06 NOTE — Telephone Encounter (Signed)
Recommend stopping lasix and getting OV to get rechecked prior to any med change.  Thanks.

## 2021-09-06 NOTE — Telephone Encounter (Signed)
Contacted pt and advised of need for apt. Pt agreed. Scheduled for Fri @ 3:00. Advised if any changes to contact office and advised of ER precautions. Pt verbalized understanding.

## 2021-09-07 NOTE — Progress Notes (Signed)
AARVI, STOTTS ([id number]) Visit Report for 09/04/2021 HPI Details Patient Name: [name], [name] Date of Service: 09/04/2021 3:45 PM Medical Record Number: [id number] Patient Account Number: [id number] Date of Birth/Sex: Jul 15, 1941 (80 y.o. F) Treating RN: Carlene Coria Primary Care Provider: [name] Other Clinician: Referring Provider: [name] Treating Provider/Extender: Tito Dine in Treatment: 0 History of Present Illness HPI Description: 08/31/2021 upon evaluation today patient presents for initial inspection here in our clinic concerning issues that she is having with a wound on her left anterior lower extremity. This is actually an area that she unfortunately tells me she was picking up a cage and dropped hitting the anterior portion of her shin causing a skin tear. She did not go to the hospital due to the fact that she did not realize that she would need to. This happened on July 30, 2021. With that being said the patient has been on Augmentin she has about 1 and half days of this left. Nonetheless she tells me that she does have a longstanding history of leg swelling. She does have chronic venous insufficiency as well it appears to me. She also has some evidence of may be early stages of lymphedema. She has a history of fibromyalgia, bilateral knee osteoarthritis, and appears to have a fungal infection of the right ankle region. In regard to the arthritis she tells me her legs tend to swell less after she gets her knee injections allowing her to be able to walk and get around much more effectively. Following the injections she does well for a while and then when that seems to be wearing off she tends to start having issues with more swelling because she is more mobile that makes sense from a venous standpoint. 10/10; I was asked to see this patient today who came in for a nurse visit. Firstly she has developed a widespread very pruritic rash  involving her abdomen and back. Lesser extent on her feet. The second problem is that she is complaining about the tightness of the wound and wrap on the left leg where the wound is. Electronic Signature(s) Signed: 09/04/2021 5:14:01 PM By: Linton Ham MD Entered By: Linton Ham on 09/04/2021 17:03:37 [name], [name] ([id number]) -------------------------------------------------------------------------------- Physical Exam Details Patient Name: [name], [name] Date of Service: 09/04/2021 3:45 PM Medical Record Number: [id number] Patient Account Number: [id number] Date of Birth/Sex: Jul 31, 1941 (80 y.o. F) Treating RN: Carlene Coria Primary Care Provider: [name] Other Clinician: Referring Provider: [name] Treating Provider/Extender: Tito Dine in Treatment: 0 Constitutional Patient is hypertensive.But very very anxious. Pulse regular and within target range for patient.Marland Kitchen Respirations regular, non-labored and within target range.. Temperature is normal and within the target range for the patient.Marland Kitchen appears in no distress. Cardiovascular Fairly widespread rash involving her back and abdomen. Nonpitting edema in the right lower leg. The edema in her left leg is much better. Integumentary (Hair, Skin) . Notes Wound exam; the areas on the left anterior mid tibia. Punched out, shaped wound the surface of this does not look too bad. 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 Electronic Signature(s) Signed: 09/04/2021 5:14:01 PM By: Linton Ham MD Entered By: Linton Ham on 09/04/2021 17:05:10 [name], [name] ([id number]) -------------------------------------------------------------------------------- Physician Orders Details Patient Name: [name], [name] Date of Service:  09/04/2021 3:45 PM Medical Record Number:  [id number] Patient Account Number: [id number] Date of Birth/Sex: 1941/10/12 (80 y.o. F) Treating RN: Carlene Coria Primary Care Provider: [name] Other Clinician: Referring Provider: [name] Treating Provider/Extender: Tito Dine in Treatment: 0 Verbal / Phone Orders: No Diagnosis Coding Follow-up Appointments o Return Appointment in 2 weeks. o Nurse Visit as needed - Twice a week Bathing/ Shower/ Hygiene o May shower with wound dressing protected with water repellent cover or cast protector. o No tub bath. Edema Control - Lymphedema / Segmental Compressive Device / Other o Elevate, Exercise Daily and Avoid Standing for Long Periods of Time. o Elevate legs to the level of the heart and pump ankles as often as possible o Elevate leg(s) parallel to the floor when sitting. Wound Treatment Wound #1 - Lower Leg Wound Laterality: Left, Anterior Cleanser: Soap and Water 2 x Per Week/30 Days Discharge Instructions: Gently cleanse wound with antibacterial soap, rinse and pat dry prior to dressing wounds Primary Dressing: Prisma 4.34 (in) 2 x Per Week/30 Days Discharge Instructions: Moisten w/normal saline or sterile water; Cover wound as directed. Do not remove from wound bed. Secondary Dressing: Gauze 2 x Per Week/30 Days Discharge Instructions: As directed: dry, moistened with saline or moistened with Dakins Solution Secondary Dressing: Kerlix 4.5 x 4.1 (in/yd) 2 x Per Week/30 Days Discharge Instructions: Apply Kerlix 4.5 x 4.1 (in/yd) as instructed Secondary Dressing: coban 2 x Per Week/30 Days Electronic Signature(s) Signed: 09/04/2021 5:14:01 PM By: Linton Ham MD Signed: 09/07/2021 1:01:44 PM By: Carlene Coria RN Entered By: Carlene Coria on 09/04/2021 16:38:07 [name], [name] ([id number]) -------------------------------------------------------------------------------- Problem List Details Patient  Name: [name], [name] Date of Service: 09/04/2021 3:45 PM Medical Record Number: [id number] Patient Account Number: [id number] Date of Birth/Sex: 05-Feb-1941 (80 y.o. 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 Date of Service: 09/04/2021 3:45 PM Medical Record Number: [id number] Patient Account Number: [id number] Date of Birth/Sex: 1941-02-13 (80 y.o. F) Treating RN: Carlene Coria Primary Care Provider: [name] Other Clinician: Referring Provider: [name] Treating Provider/Extender: Tito Dine in Treatment: 0 Subjective History of Present Illness (HPI) 08/31/2021 upon evaluation today patient presents for initial inspection here in our clinic concerning issues that she is having with a wound on her left anterior lower extremity. This is actually an area that she unfortunately tells me she was picking up a cage and dropped hitting the anterior portion of her shin causing a skin tear. She did not go to the hospital due to the fact that she  did not realize that she would need to. This happened on July 30, 2021. With that being said the patient has been on Augmentin she has about 1 and half days of this left. Nonetheless she tells me that she does have a longstanding history of leg swelling. She does have  chronic venous insufficiency as well it appears to me. She also has some evidence of may be early stages of lymphedema. She has a history of fibromyalgia, bilateral knee osteoarthritis, and appears to have a fungal infection of the right ankle region. In regard to the arthritis she tells me her legs tend to swell less after she gets her knee injections allowing her to be able to walk and get around much more effectively. Following the injections she does well for a while and then when that seems to be wearing off she tends to start having issues with more swelling because she is more mobile that makes sense from a venous standpoint. 10/10; I was asked to see this patient today who came in for a nurse visit. Firstly she has developed a widespread very pruritic rash involving her abdomen and back. Lesser extent on her feet. The second problem is that she is complaining about the tightness of the wound and wrap on the left leg where the wound is. 1. This patient complained bitterly of the tightness of the wrap on her left leg. Against my better judgment I went ahead and reduced her to kerlix Coban. I also change the dressing to the wound to silver collagen moistened. I am hopeful that this will maintain edema control sufficiently for this wound to heal. It did not look bad today.I did warn the patient that if there are leg swelling reoccurs she will need tighter compression there is no way around this 2. The rash she has seems to correlate with the starting of Lasix last Thursday. Lasix has a sulfur moiety as do most loop diuretics. I suspect that is the cause of the more systemic rash rather than anything we gave her topically. In any  case I recommended stopping the Lasix Benadryl which she will get over the counter tonight. 3. We use silver collagen in the wound on her left leg Objective Constitutional Patient is hypertensive.But very very anxious. Pulse regular and within target range for patient.Marland Kitchen Respirations regular, non-labored and within target range.. Temperature is normal and within the target range for the patient.Marland Kitchen appears in no distress. Vitals Time Taken: 3:57 PM, Height: 63 in, Weight: 198 lbs, BMI: 35.1, Temperature: 98 F, Pulse: 80 bpm, Respiratory Rate: 18 breaths/min, Blood Pressure: 197/112 mmHg. General Notes: notified Dr Dellia Nims , patient denies blurred vision, ringing of ears , bloody nose , decreased LOC Cardiovascular Fairly widespread rash involving her back and abdomen. Nonpitting edema in the right lower leg. The edema in her left leg is much better. General Notes: Wound exam; the areas on the left anterior mid tibia. Punched out, shaped wound the surface of this does not look too bad. 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 Integumentary (Hair, Skin) Wound #1 status is Open. Original cause of wound was Skin Tear/Laceration. The date acquired was: 07/30/2021. The wound is located on the Left,Anterior Lower Leg. The wound measures 3.2cm length x 0.5cm width x 0.5cm depth; 1.257cm^2 area and 0.628cm^3 volume. There is Fat Layer (Subcutaneous Tissue) exposed. There is no tunneling or undermining noted. There is a medium amount of serosanguineous drainage noted. The wound margin is flat and intact. There is small (1-33%) red granulation within the wound bed. There is a large (67-100%) amount of necrotic tissue within the wound bed including Adherent Slough. [name], [name]  L. ([id number]) Assessment Active Problems ICD-10 Venous  insufficiency (chronic) (peripheral) Non-pressure chronic ulcer of other part of left lower leg with fat layer exposed Fibromyalgia Bilateral primary osteoarthritis of knee Tinea corporis Rash and other nonspecific skin eruption Procedures Wound #1 Pre-procedure diagnosis of Wound #1 is a Venous Leg Ulcer located on the Left,Anterior Lower Leg . There was a Double Layer Compression Therapy Procedure by Carlene Coria, RN. Post procedure Diagnosis Wound #1: Same as Pre-Procedure Plan Follow-up Appointments: Return Appointment in 2 weeks. Nurse Visit as needed - Twice a week Bathing/ Shower/ Hygiene: May shower with wound dressing protected with water repellent cover or cast protector. No tub bath. Edema Control - Lymphedema / Segmental Compressive Device / Other: Elevate, Exercise Daily and Avoid Standing for Long Periods of Time. Elevate legs to the level of the heart and pump ankles as often as possible Elevate leg(s) parallel to the floor when sitting. WOUND #1: - Lower Leg Wound Laterality: Left, Anterior Cleanser: Soap and Water 2 x Per Week/30 Days Discharge Instructions: Gently cleanse wound with antibacterial soap, rinse and pat dry prior to dressing wounds Primary Dressing: Prisma 4.34 (in) 2 x Per Week/30 Days Discharge Instructions: Moisten w/normal saline or sterile water; Cover wound as directed. Do not remove from wound bed. Secondary Dressing: Gauze 2 x Per Week/30 Days Discharge Instructions: As directed: dry, moistened with saline or moistened with Dakins Solution Secondary Dressing: Kerlix 4.5 x 4.1 (in/yd) 2 x Per Week/30 Days Discharge Instructions: Apply Kerlix 4.5 x 4.1 (in/yd) as instructed Secondary Dressing: coban 2 x Per Week/30 Days Electronic Signature(s) Signed: 09/04/2021 5:14:01 PM By: Linton Ham MD Entered By: Linton Ham on 09/04/2021 17:10:44 [name], [name]  ([id number]) -------------------------------------------------------------------------------- SuperBill Details Patient Name: Grams, [name] Date of Service: 09/04/2021 Medical Record Number: [id number] Patient Account Number: [id number] Date of Birth/Sex: 05-12-1941 (80 y.o. F) Treating RN: Carlene Coria Primary Care Provider: [name] Other Clinician: Referring Provider: [name] Treating Provider/Extender: Tito Dine in Treatment: 0 Diagnosis Coding ICD-10 Codes Code Description I87.2 Venous insufficiency (chronic) (peripheral) L97.822 Non-pressure chronic ulcer of other part of left lower leg with fat layer exposed M79.7 Fibromyalgia M17.0 Bilateral primary osteoarthritis of knee Facility Procedures CPT4 Code: [id number] Description: (Facility Use Only) [phone] - [place] [facility] LWR LT LEG Modifier: Quantity: 1 Physician Procedures CPT4 Code: [id number] Description: 73220 - WC PHYS LEVEL 4 - EST PT Modifier: Quantity: 1 CPT4 Code: Description: ICD-10 Diagnosis Description L97.822 Non-pressure chronic ulcer of other part of left lower leg with fat lay I87.2 Venous insufficiency (chronic) (peripheral) Modifier: er exposed Quantity: Electronic Signature(s) Signed: 09/04/2021 5:14:01 PM By: Linton Ham MD Previous Signature: 09/04/2021 4:38:31 PM Version By: Carlene Coria RN Entered By: Linton Ham on 09/04/2021 17:11:15

## 2021-09-07 NOTE — Telephone Encounter (Signed)
Per appt notes pt has appt with Dr Damita Dunnings on 10/14//22 at 3:00. Sending note to Dr Damita Dunnings and Janett Billow CMA.

## 2021-09-07 NOTE — Telephone Encounter (Signed)
[name] - Client TELEPHONE ADVICE RECORD [facility] Patient Name: [name] Gender: Female DOB: 1941-03-06 Age: 80 Y 11 M 13 D Return Phone Number: [phone] (Primary), [phone] (Secondary) Address: City/ State/ Zip: [place]  [address] Client [name] - Client Client Site [facility] Physician Renford Dills - MD Contact Type Call Who Is Calling Patient / Member / Family / Caregiver Call Type Triage / Clinical Caller Name Charlynne Cousins Relationship To Patient Daughter Return Phone Number [phone] (Primary) Chief Complaint Rash - Widespread Reason for Call Symptomatic / Request for [place] states her mother has developed a rash that they thought was from her taking Lasix because of the little bit of Sulfa. Caller states the rash is very itchy and is located on both her arms,worse on the back of her legs, slightly on the top her feet, and stomach. Caller states they discontinued the medication last Monday, but the rash is getting worse, and was advised by the Wound Care to take a 1/2 Benadryl four times daily but it is not helping. Translation No Disp. Time Eilene Ghazi Time) Disposition Final User 09/06/2021 4:01:03 PM Attempt made - message left Lynett Fish 09/06/2021 4:13:53 PM Clinical Call Yes Eugenio Hoes, RN, Jenny Reichmann Comments User: Baird Cancer, RN Date/Time Eilene Ghazi Time): 09/06/2021 4:13:44 PM Caller states that her mother has already spoken to the office about her symptoms and just made an appontment with the office. traige is no longer neede

## 2021-09-07 NOTE — Progress Notes (Signed)
the last three years: Yes Total Score: 0 Level Of Care: ____ [name] ([id number]) Electronic Signature(s) Signed: 09/07/2021 1:01:44 PM By: Carlene Coria RN Entered By: Carlene Coria on 09/04/2021 16:38:16 [name], [name] ([id number]) -------------------------------------------------------------------------------- Compression Therapy Details Patient Name: [name], [name] Date of Service: 09/04/2021 3:45 PM Medical Record Number: [id number] Patient Account Number: [id number] Date of Birth/Sex: 1941/04/25 (80 y.o. F) Treating RN: Carlene Coria Primary Care Khoa Opdahl: [name] Other Clinician: Referring Armstead Heiland: [name] Treating Lafonda Patron/Extender: Tito Dine in Treatment: 0 Compression Therapy Performed for Wound Assessment: Wound #1 Left,Anterior Lower Leg Performed By: Clinician Carlene Coria, RN Compression Type: Double Layer Post Procedure Diagnosis Same as Pre-procedure Electronic Signature(s) Signed: 09/04/2021 4:36:09 PM By: Carlene Coria RN Entered By: Carlene Coria on 09/04/2021 [name], [name] ([id number]) -------------------------------------------------------------------------------- Encounter Discharge Information Details Patient Name: [name], [name] Date of Service: 09/04/2021 3:45 PM Medical Record Number: [id number] Patient Account Number: [id number] Date of Birth/Sex: [birth year] (80 y.o. F) Treating RN: Carlene Coria Primary Care Lili Harts: [name] Other Clinician: Referring Elberta Lachapelle: [name] Treating Brytni Dray/Extender: Tito Dine in Treatment: 0 Encounter Discharge Information Items Discharge Condition: Stable Ambulatory Status: Walker Discharge Destination: Home Transportation: Private Auto Accompanied By: self Schedule Follow-up Appointment:  Yes Clinical Summary of Care: Patient Declined Electronic Signature(s) Signed: 09/04/2021 4:39:51 PM By: Carlene Coria RN Entered By: Carlene Coria on 09/04/2021 16:39:51 [name], [name] ([id number]) -------------------------------------------------------------------------------- Lower Extremity Assessment Details Patient Name: Devargas, Sybilla L. Date of Service: 09/04/2021 3:45 PM Medical Record Number: [id number] Patient Account Number: [id number] Date of Birth/Sex: Mar 29, 1941 (80 y.o. F) Treating RN: Carlene Coria Primary Care Kimberle Stanfill: [name] Other Clinician: Referring Suhey Radford: [name] Treating Landry Kamath/Extender: Tito Dine in Treatment: 0 Edema Assessment Assessed: [Left: No] [Right: No] Edema: [Left: Ye] [Right: s] Calf Left: Right: Point of Measurement: 31 cm From Medial Instep 40 cm Ankle Left: Right: Point of Measurement: 11 cm From Medial Instep 28 cm Vascular Assessment Pulses: Dorsalis Pedis Palpable: [Left:Yes] Electronic Signature(s) Signed: 09/07/2021 1:01:44 PM By: Carlene Coria RN Entered By: Carlene Coria on 09/04/2021 16:10:39 [name], [name] ([id number]) -------------------------------------------------------------------------------- Multi Wound Chart Details Patient Name: [name], [name] Date of Service: 09/04/2021 3:45 PM Medical Record Number: [id number] Patient Account Number: [id number] Date of Birth/Sex: 03-Apr-1941 (80 y.o. F) Treating RN: Carlene Coria Primary Care Franki Stemen: [name] Other Clinician: Referring Amely Voorheis: [name] Treating Hiroko Tregre/Extender: Tito Dine in Treatment: 0 Vital Signs Height(in): 63 Pulse(bpm): 82 Weight(lbs): 198 Blood Pressure(mmHg): 197/112 Body Mass Index(BMI): 35 Temperature(F): 98 Respiratory Rate(breaths/min): 18 Photos: [N/A:N/A] Wound Location: Left, Anterior Lower Leg N/A N/A Wounding Event: Skin Tear/Laceration N/A N/A Primary Etiology:  Venous Leg Ulcer N/A N/A Comorbid History: Cataracts, Osteoarthritis, Received N/A N/A Radiation Date Acquired: 07/30/2021 N/A N/A Weeks of Treatment: 0 N/A N/A Wound Status: Open N/A N/A Measurements L x W x D (cm) 3.2x0.5x0.5 N/A N/A Area (cm) : 1.257 N/A N/A Volume (cm) : 0.628 N/A N/A % Reduction in Area: 0.00% N/A N/A % Reduction in Volume: 0.00% N/A N/A Classification: Full Thickness Without Exposed N/A N/A Support Structures Exudate Amount: Medium N/A N/A Exudate Type: Serosanguineous N/A N/A Exudate Color: red, brown N/A N/A Wound Margin: Flat and Intact N/A N/A Granulation Amount: Small (1-33%) N/A N/A Granulation Quality: Red N/A N/A Necrotic Amount: Large (67-100%) N/A N/A Exposed Structures: Fat Layer (Subcutaneous Tissue): N/A N/A Yes Fascia: No Tendon: No Muscle: No Joint: No Bone: No Epithelialization: Small (1-33%) N/A N/A Procedures Performed:  Entered By: Carlene Coria on 09/04/2021 [place], [name] ([id number]) -------------------------------------------------------------------------------- Patient/Caregiver Education Details Patient Name: [name], [name] Date of  Service: 09/04/2021 3:45 PM Medical Record Number: [id number] Patient Account Number: [id number] Date of Birth/Gender: June 19, 1941 (80 y.o. F) Treating RN: Carlene Coria Primary Care Physician: [name] Other Clinician: Referring Physician: [name] Treating Physician/Extender: Tito Dine in Treatment: 0 Education Assessment Education Provided To: Patient Education Topics Provided Wound/Skin Impairment: Methods: Explain/Verbal Responses: State content correctly Electronic Signature(s) Signed: 09/07/2021 1:01:44 PM By: Carlene Coria RN Entered By: Carlene Coria on 09/04/2021 16:38:42 [name] ([id number]) -------------------------------------------------------------------------------- Wound Assessment Details Patient Name: [name], [name] Date of Service: 09/04/2021 3:45 PM Medical Record Number: [id number] Patient Account Number: [id number] Date of Birth/Sex: 1941-01-10 (80 y.o. F) Treating RN: Carlene Coria Primary Care Wali Reinheimer: [name] Other Clinician: Referring Tommi Crepeau: [name] Treating Travious Vanover/Extender: Tito Dine in Treatment: 0 Wound Status Wound Number: 1 Primary Etiology: Venous Leg Ulcer Wound Location: Left, Anterior Lower Leg Wound Status: Open Wounding Event: Skin Tear/Laceration Comorbid History: Cataracts, Osteoarthritis, Received Radiation Date Acquired: 07/30/2021 Weeks Of Treatment: 0 Clustered Wound: No Photos Wound Measurements Length: (cm) 3.2 Width: (cm) 0.5 Depth: (cm) 0.5 Area: (cm) 1.257 Volume: (cm) 0.628 % Reduction in Area: 0% % Reduction in Volume: 0% Epithelialization: Small (1-33%) Tunneling: No Undermining: No Wound Description Classification: Full Thickness Without Exposed Support Structures Wound Margin: Flat and Intact Exudate Amount: Medium Exudate Type: Serosanguineous Exudate Color: red, brown Foul Odor After Cleansing: No Slough/Fibrino Yes Wound  Bed Granulation Amount: Small (1-33%) Exposed Structure Granulation Quality: Red Fascia Exposed: No Necrotic Amount: Large (67-100%) Fat Layer (Subcutaneous Tissue) Exposed: Yes Necrotic Quality: Adherent Slough Tendon Exposed: No Muscle Exposed: No Joint Exposed: No Bone Exposed: No Treatment Notes Wound #1 (Lower Leg) Wound Laterality: Left, Anterior Cleanser Soap and Water Discharge Instruction: Gently cleanse wound with antibacterial soap, rinse and pat dry prior to dressing wounds Peri-Wound Care Hollis, Shanley L. ([id number]) Topical Primary Dressing Prisma 4.34 (in) Discharge Instruction: Moisten w/normal saline or sterile water; Cover wound as directed. Do not remove from wound bed. Secondary Dressing Gauze Discharge Instruction: As directed: dry, moistened with saline or moistened with Dakins Solution Kerlix 4.5 x 4.1 (in/yd) Discharge Instruction: Apply Kerlix 4.5 x 4.1 (in/yd) as instructed coban Secured With Compression Wrap Compression Stockings Add-Ons Electronic Signature(s) Signed: 09/07/2021 1:01:44 PM By: Carlene Coria RN Entered By: Carlene Coria on 09/04/2021 [place], [name] ([id number]) -------------------------------------------------------------------------------- [place] Details Patient Name: [name], [name] Date of Service: 09/04/2021 3:45 PM Medical Record Number: [id number] Patient Account Number: [id number] Date of Birth/Sex: [birth year] (80 y.o. F) Treating RN: Carlene Coria Primary Care Montia Haslip: [name] Other Clinician: Referring Voncille Simm: [name] Treating Smriti Barkow/Extender: Tito Dine in Treatment: 0 Vital Signs Time Taken: 15:57 Temperature (F): 98 Height (in): 63 Pulse (bpm): 80 Weight (lbs): 198 Respiratory Rate (breaths/min): 18 Body Mass Index (BMI): 35.1 Blood Pressure (mmHg): 197/112 Reference Range: 80 - 120 mg / dl Notes notified Dr Dellia Nims , patient denies blurred vision, ringing  of ears , bloody nose , decreased LOC Electronic Signature(s) Signed: 09/07/2021 1:01:44 PM By: Carlene Coria RN Entered By: Carlene Coria on 09/04/2021 16:01:09  [name], [name] ([id number]) Visit Report for 09/04/2021 Arrival Information Details Patient Name: CHERIL, SLATTERY. Date of Service: 09/04/2021 3:45 PM Medical Record Number: [id number] Patient Account Number: [id number] Date of Birth/Sex: 04/03/1941 (80 y.o. F) Treating RN: Carlene Coria Primary Care Cris Gibby: [name] Other Clinician: Referring Priscilla Finklea: [name] Treating Jabin Tapp/Extender: Tito Dine in Treatment: 0 Visit Information History Since Last Visit All ordered tests and consults were completed: No Patient Arrived: [name] Added or deleted any medications: No Arrival Time: 15:56 Any new allergies or adverse reactions: No Accompanied By: daughter Had a fall or experienced change in No Transfer Assistance: None activities of daily living that may affect Patient Identification Verified: Yes risk of falls: Secondary Verification Process Completed: Yes Signs or symptoms of abuse/neglect since last visito No Patient Requires Transmission-Based Precautions: No Hospitalized since last visit: No Patient Has Alerts: No Implantable device outside of the clinic excluding No cellular tissue based products placed in the center since last visit: Has Dressing in Place as Prescribed: Yes Has Compression in Place as Prescribed: Yes Pain Present Now: No Electronic Signature(s) Signed: 09/07/2021 1:01:44 PM By: Carlene Coria RN Entered By: Carlene Coria on 09/04/2021 15:57:55 [name], [name] ([id number]) -------------------------------------------------------------------------------- Clinic Level of Care Assessment Details Patient Name: [name], [name] Date of Service: 09/04/2021 3:45 PM Medical Record Number: [id number] Patient Account Number: [id number] Date of Birth/Sex: [birth year] (80 y.o. F) Treating RN: Carlene Coria Primary Care Renee Beale: [name] Other Clinician: Referring Aine Strycharz: [name] Treating Shila Kruczek/Extender: Tito Dine in Treatment: 0 Clinic Level of Care Assessment Items TOOL 1 Quantity Score []  - Use when EandM and Procedure is performed on INITIAL visit 0 ASSESSMENTS - Nursing Assessment / Reassessment []  - General Physical Exam (combine w/ comprehensive assessment (listed just below) when performed on new 0 pt. evals) []  - 0 Comprehensive Assessment (HX, ROS, Risk Assessments, Wounds Hx, etc.) ASSESSMENTS - Wound and Skin Assessment / Reassessment []  - Dermatologic / Skin Assessment (not related to wound area) 0 ASSESSMENTS - Ostomy and/or Continence Assessment and Care []  - Incontinence Assessment and Management 0 []  - 0 Ostomy Care Assessment and Management (repouching, etc.) PROCESS - Coordination of Care []  - Simple Patient / Family Education for ongoing care 0 []  - 0 Complex (extensive) Patient / Family Education for ongoing care []  - 0 Staff obtains Programmer, systems, Records, Test Results / Process Orders []  - 0 Staff telephones HHA, Nursing Homes / Clarify orders / etc []  - 0 Routine Transfer to another Facility (non-emergent condition) []  - 0 Routine Hospital Admission (non-emergent condition) []  - 0 New Admissions / Biomedical engineer / Ordering NPWT, Apligraf, etc. []  - 0 Emergency Hospital Admission (emergent condition) PROCESS - Special Needs []  - Pediatric / Minor Patient Management 0 []  - 0 Isolation Patient Management []  - 0 Hearing / Language / Visual special needs []  - 0 Assessment of Community assistance (transportation, D/C planning, etc.) []  - 0 Additional assistance / Altered mentation []  - 0 Support Surface(s) Assessment (bed, cushion, seat, etc.) INTERVENTIONS - Miscellaneous []  - External ear exam 0 []  - 0 Patient Transfer (multiple staff / Civil Service fast streamer / Similar devices) []  - 0 Simple Staple / Suture removal (25 or less) []  - 0 Complex Staple / Suture removal (26 or more) []  - 0 Hypo/Hyperglycemic Management (do not check if billed  separately) []  - 0 Ankle / Brachial Index (ABI) - do not check if billed separately Has the patient been seen at the hospital within  the last three years: Yes Total Score: 0 Level Of Care: ____ [name] ([id number]) Electronic Signature(s) Signed: 09/07/2021 1:01:44 PM By: Carlene Coria RN Entered By: Carlene Coria on 09/04/2021 16:38:16 [name], [name] ([id number]) -------------------------------------------------------------------------------- Compression Therapy Details Patient Name: [name], [name] Date of Service: 09/04/2021 3:45 PM Medical Record Number: [id number] Patient Account Number: [id number] Date of Birth/Sex: 1941/04/25 (80 y.o. F) Treating RN: Carlene Coria Primary Care Khoa Opdahl: [name] Other Clinician: Referring Armstead Heiland: [name] Treating Lafonda Patron/Extender: Tito Dine in Treatment: 0 Compression Therapy Performed for Wound Assessment: Wound #1 Left,Anterior Lower Leg Performed By: Clinician Carlene Coria, RN Compression Type: Double Layer Post Procedure Diagnosis Same as Pre-procedure Electronic Signature(s) Signed: 09/04/2021 4:36:09 PM By: Carlene Coria RN Entered By: Carlene Coria on 09/04/2021 [name], [name] ([id number]) -------------------------------------------------------------------------------- Encounter Discharge Information Details Patient Name: [name], [name] Date of Service: 09/04/2021 3:45 PM Medical Record Number: [id number] Patient Account Number: [id number] Date of Birth/Sex: [birth year] (80 y.o. F) Treating RN: Carlene Coria Primary Care Lili Harts: [name] Other Clinician: Referring Elberta Lachapelle: [name] Treating Brytni Dray/Extender: Tito Dine in Treatment: 0 Encounter Discharge Information Items Discharge Condition: Stable Ambulatory Status: Walker Discharge Destination: Home Transportation: Private Auto Accompanied By: self Schedule Follow-up Appointment:  Yes Clinical Summary of Care: Patient Declined Electronic Signature(s) Signed: 09/04/2021 4:39:51 PM By: Carlene Coria RN Entered By: Carlene Coria on 09/04/2021 16:39:51 [name], [name] ([id number]) -------------------------------------------------------------------------------- Lower Extremity Assessment Details Patient Name: Devargas, Sybilla L. Date of Service: 09/04/2021 3:45 PM Medical Record Number: [id number] Patient Account Number: [id number] Date of Birth/Sex: Mar 29, 1941 (80 y.o. F) Treating RN: Carlene Coria Primary Care Kimberle Stanfill: [name] Other Clinician: Referring Suhey Radford: [name] Treating Landry Kamath/Extender: Tito Dine in Treatment: 0 Edema Assessment Assessed: [Left: No] [Right: No] Edema: [Left: Ye] [Right: s] Calf Left: Right: Point of Measurement: 31 cm From Medial Instep 40 cm Ankle Left: Right: Point of Measurement: 11 cm From Medial Instep 28 cm Vascular Assessment Pulses: Dorsalis Pedis Palpable: [Left:Yes] Electronic Signature(s) Signed: 09/07/2021 1:01:44 PM By: Carlene Coria RN Entered By: Carlene Coria on 09/04/2021 16:10:39 [name], [name] ([id number]) -------------------------------------------------------------------------------- Multi Wound Chart Details Patient Name: [name], [name] Date of Service: 09/04/2021 3:45 PM Medical Record Number: [id number] Patient Account Number: [id number] Date of Birth/Sex: 03-Apr-1941 (80 y.o. F) Treating RN: Carlene Coria Primary Care Franki Stemen: [name] Other Clinician: Referring Amely Voorheis: [name] Treating Hiroko Tregre/Extender: Tito Dine in Treatment: 0 Vital Signs Height(in): 63 Pulse(bpm): 82 Weight(lbs): 198 Blood Pressure(mmHg): 197/112 Body Mass Index(BMI): 35 Temperature(F): 98 Respiratory Rate(breaths/min): 18 Photos: [N/A:N/A] Wound Location: Left, Anterior Lower Leg N/A N/A Wounding Event: Skin Tear/Laceration N/A N/A Primary Etiology:  Venous Leg Ulcer N/A N/A Comorbid History: Cataracts, Osteoarthritis, Received N/A N/A Radiation Date Acquired: 07/30/2021 N/A N/A Weeks of Treatment: 0 N/A N/A Wound Status: Open N/A N/A Measurements L x W x D (cm) 3.2x0.5x0.5 N/A N/A Area (cm) : 1.257 N/A N/A Volume (cm) : 0.628 N/A N/A % Reduction in Area: 0.00% N/A N/A % Reduction in Volume: 0.00% N/A N/A Classification: Full Thickness Without Exposed N/A N/A Support Structures Exudate Amount: Medium N/A N/A Exudate Type: Serosanguineous N/A N/A Exudate Color: red, brown N/A N/A Wound Margin: Flat and Intact N/A N/A Granulation Amount: Small (1-33%) N/A N/A Granulation Quality: Red N/A N/A Necrotic Amount: Large (67-100%) N/A N/A Exposed Structures: Fat Layer (Subcutaneous Tissue): N/A N/A Yes Fascia: No Tendon: No Muscle: No Joint: No Bone: No Epithelialization: Small (1-33%) N/A N/A Procedures Performed:

## 2021-09-07 NOTE — Telephone Encounter (Signed)
Noted. Thanks.

## 2021-09-08 ENCOUNTER — Ambulatory Visit: Payer: PPO | Admitting: Family Medicine

## 2021-09-08 DIAGNOSIS — L309 Dermatitis, unspecified: Secondary | ICD-10-CM | POA: Diagnosis not present

## 2021-09-08 DIAGNOSIS — L97822 Non-pressure chronic ulcer of other part of left lower leg with fat layer exposed: Secondary | ICD-10-CM | POA: Diagnosis not present

## 2021-09-11 NOTE — Progress Notes (Signed)
[name], [name] ([id number]) Visit Report for 09/08/2021 Arrival Information Details Patient Name: KAAMILYA, STCHARLES. Date of Service: 09/08/2021 11:45 AM Medical Record Number: [id number] Patient Account Number: [id number] Date of Birth/Sex: 1941-07-27 (80 y.o. F) Treating RN: Huel Coventry Primary Care Tagen Milby: [name] Givens Other Clinician: Referring Ayushi Pla: [name] Givens Treating Teonna Coonan/Extender: Rowan Blase in Treatment: 1 Visit Information History Since Last Visit Has Dressing in Place as Prescribed: Yes Patient Arrived: Walker Pain Present Now: No Arrival Time: 11:55 Transfer Assistance: None Patient Identification Verified: Yes Secondary Verification Process Completed: Yes Patient Requires Transmission-Based Precautions: No Patient Has Alerts: No Electronic Signature(s) Signed: 09/11/2021 5:13:51 PM By: Elliot Gurney, BSN, RN, CWS, Kim RN, BSN Entered By: Elliot Gurney, BSN, RN, CWS, Kim on 09/08/2021 12:08:56 [name], [name] ([id number]) -------------------------------------------------------------------------------- Clinic Level of Care Assessment Details Patient Name: [name], [name] Date of Service: 09/08/2021 11:45 AM Medical Record Number: [id number] Patient Account Number: [id number] Date of Birth/Sex: 09-21-1941 (80 y.o. F) Treating RN: Huel Coventry Primary Care Tytiana Coles: [name] Givens Other Clinician: Referring Devina Bezold: [name] Givens Treating Ronnesha Mester/Extender: Rowan Blase in Treatment: 1 Clinic Level of Care Assessment Items TOOL 4 Quantity Score []  - Use when only an EandM is performed on FOLLOW-UP visit 0 ASSESSMENTS - Nursing Assessment / Reassessment X - Reassessment of Co-morbidities (includes updates in patient status) 1 10 X- 1 5 Reassessment of Adherence to Treatment Plan ASSESSMENTS - Wound and Skin Assessment / Reassessment X - Simple Wound Assessment / Reassessment - one wound 1 5 []  - 0 Complex Wound Assessment / Reassessment -  multiple wounds []  - 0 Dermatologic / Skin Assessment (not related to wound area) ASSESSMENTS - Focused Assessment []  - Circumferential Edema Measurements - multi extremities 0 []  - 0 Nutritional Assessment / Counseling / Intervention []  - 0 Lower Extremity Assessment (monofilament, tuning fork, pulses) []  - 0 Peripheral Arterial Disease Assessment (using hand held doppler) ASSESSMENTS - Ostomy and/or Continence Assessment and Care []  - Incontinence Assessment and Management 0 []  - 0 Ostomy Care Assessment and Management (repouching, etc.) PROCESS - Coordination of Care X - Simple Patient / Family Education for ongoing care 1 15 []  - 0 Complex (extensive) Patient / Family Education for ongoing care []  - 0 Staff obtains Chiropractor, Records, Test Results / Process Orders []  - 0 Staff telephones HHA, Nursing Homes / Clarify orders / etc []  - 0 Routine Transfer to another Facility (non-emergent condition) []  - 0 Routine Hospital Admission (non-emergent condition) []  - 0 New Admissions / Manufacturing engineer / Ordering NPWT, Apligraf, etc. []  - 0 Emergency Hospital Admission (emergent condition) X- 1 10 Simple Discharge Coordination []  - 0 Complex (extensive) Discharge Coordination PROCESS - Special Needs []  - Pediatric / Minor Patient Management 0 []  - 0 Isolation Patient Management []  - 0 Hearing / Language / Visual special needs []  - 0 Assessment of Community assistance (transportation, D/C planning, etc.) []  - 0 Additional assistance / Altered mentation []  - 0 Support Surface(s) Assessment (bed, cushion, seat, etc.) INTERVENTIONS - Wound Cleansing / Measurement [name], [name] ([id number]) X- 1 5 Simple Wound Cleansing - one wound []  - 0 Complex Wound Cleansing - multiple wounds X- 1 5 Wound Imaging (photographs - any number of wounds) []  - 0 Wound Tracing (instead of photographs) []  - 0 Simple Wound Measurement - one wound []  - 0 Complex Wound  Measurement - multiple wounds INTERVENTIONS - Wound Dressings []  - Small Wound Dressing one or multiple wounds 0 []  - 0 Medium Wound Dressing one  or multiple wounds []  - 0 Large Wound Dressing one or multiple wounds X- 1 5 Application of Medications - topical []  - 0 Application of Medications - injection INTERVENTIONS - Miscellaneous []  - External ear exam 0 []  - 0 Specimen Collection (cultures, biopsies, blood, body fluids, etc.) []  - 0 Specimen(s) / Culture(s) sent or taken to Lab for analysis []  - 0 Patient Transfer (multiple staff / Nurse, adult / Similar devices) []  - 0 Simple Staple / Suture removal (25 or less) []  - 0 Complex Staple / Suture removal (26 or more) []  - 0 Hypo / Hyperglycemic Management (close monitor of Blood Glucose) []  - 0 Ankle / Brachial Index (ABI) - do not check if billed separately []  - 0 Vital Signs Has the patient been seen at the hospital within the last three years: Yes Total Score: 60 Level Of Care: New/Established - Level 2 Electronic Signature(s) Signed: 09/11/2021 5:13:51 PM By: Elliot Gurney, BSN, RN, CWS, Kim RN, BSN Entered By: Elliot Gurney, BSN, RN, CWS, Kim on 09/08/2021 12:11:15 [name], [name] ([id number]) -------------------------------------------------------------------------------- Encounter Discharge Information Details Patient Name: [name], [name] Date of Service: 09/08/2021 11:45 AM Medical Record Number: [id number] Patient Account Number: [id number] Date of Birth/Sex: 1941/07/17 (80 y.o. F) Treating RN: Huel Coventry Primary Care Mineola Duan: [name] Givens Other Clinician: Referring Gianelle Mccaul: [name] Givens Treating Toryn Dewalt/Extender: Rowan Blase in Treatment: 1 Encounter Discharge Information Items Discharge Condition: Stable Ambulatory Status: Ambulatory Discharge Destination: Home Transportation: Private Auto Schedule Follow-up Appointment: Yes Clinical Summary of Care: Electronic Signature(s) Signed: 09/11/2021  5:13:51 PM By: Elliot Gurney, BSN, RN, CWS, Kim RN, BSN Entered By: Elliot Gurney, BSN, RN, CWS, Kim on 09/08/2021 12:10:36 [name], [name] ([id number]) -------------------------------------------------------------------------------- Wound Assessment Details Patient Name: [name], [name] Date of Service: 09/08/2021 11:45 AM Medical Record Number: [id number] Patient Account Number: [id number] Date of Birth/Sex: 06-Feb-1941 (80 y.o. F) Treating RN: Huel Coventry Primary Care Darrion Macaulay: [name] Givens Other Clinician: Referring Oryn Casanova: [name] Givens Treating Alanys Godino/Extender: Allen Derry Weeks in Treatment: 1 Wound Status Wound Number: 1 Primary Etiology: Venous Leg Ulcer Wound Location: Left, Anterior Lower Leg Wound Status: Open Wounding Event: Skin Tear/Laceration Comorbid History: Cataracts, Osteoarthritis, Received Radiation Date Acquired: 07/30/2021 Weeks Of Treatment: 1 Clustered Wound: No Photos Photo Uploaded By: Elliot Gurney, BSN, RN, CWS, Kim on 09/08/2021 13:10:34 Wound Measurements Length: (cm) 2.5 Width: (cm) 0.5 Depth: (cm) 0.2 Area: (cm) 0.982 Volume: (cm) 0.196 % Reduction in Area: 21.9% % Reduction in Volume: 68.8% Epithelialization: Small (1-33%) Tunneling: No Undermining: No Wound Description Classification: Full Thickness Without Exposed Support Structu Wound Margin: Flat and Intact Exudate Amount: Medium Exudate Type: Serosanguineous Exudate Color: red, brown res Foul Odor After Cleansing: No Slough/Fibrino Yes Wound Bed Granulation Amount: Small (1-33%) Exposed Structure Granulation Quality: Red Fascia Exposed: No Necrotic Amount: Large (67-100%) Fat Layer (Subcutaneous Tissue) Exposed: Yes Necrotic Quality: Adherent Slough Tendon Exposed: No Muscle Exposed: No Joint Exposed: No Bone Exposed: No Treatment Notes Wound #1 (Lower Leg) Wound Laterality: Left, Anterior Cleanser Soap and Water Discharge Instruction: Gently cleanse wound with antibacterial soap,  rinse and pat dry prior to dressing wounds [name], [name] Peri-Wound Care Topical Primary Dressing Prisma 4.34 (in) Discharge Instruction: Moisten w/normal saline or sterile water; Cover wound as directed. Do not remove from wound bed. Secondary Dressing Gauze Discharge Instruction: As directed: dry, moistened with saline or moistened with Dakins Solution Kerlix 4.5 x 4.1 (in/yd) Discharge Instruction: Apply Kerlix 4.5 x 4.1 (in/yd) as instructed coban Secured With Compression Wrap Compression Stockings Add-Ons Electronic Signature(s)  Signed: 09/11/2021 5:13:51 PM By: Elliot Gurney, BSN, RN, CWS, Kim RN, BSN Entered By: Elliot Gurney, BSN, RN, CWS, Kim on 09/08/2021 16:10:96

## 2021-09-14 ENCOUNTER — Encounter: Payer: PPO | Admitting: Physician Assistant

## 2021-09-14 ENCOUNTER — Other Ambulatory Visit: Payer: Self-pay

## 2021-09-14 DIAGNOSIS — I87312 Chronic venous hypertension (idiopathic) with ulcer of left lower extremity: Secondary | ICD-10-CM | POA: Diagnosis not present

## 2021-09-14 DIAGNOSIS — L97822 Non-pressure chronic ulcer of other part of left lower leg with fat layer exposed: Secondary | ICD-10-CM | POA: Diagnosis not present

## 2021-09-14 DIAGNOSIS — I872 Venous insufficiency (chronic) (peripheral): Secondary | ICD-10-CM | POA: Diagnosis not present

## 2021-09-14 NOTE — Progress Notes (Signed)
ADRIANAH, VIERTEL ([id number]) Visit Report for 09/14/2021 Arrival Information Details Patient Name: [name], [name]. Date of Service: 09/14/2021 2:45 PM Medical Record Number: [id number] Patient Account Number: [id number] Date of Birth/Sex: [birth year] (80 y.o. F) Treating RN: Rogers Blocker Primary Care Shavonna Corella: [name] Givens Other Clinician: Referring Karliah Kowalchuk: [name] Givens Treating Ronak Duquette/Extender: Rowan Blase in Treatment: 2 Visit Information History Since Last Visit Has Dressing in Place as Prescribed: No Patient Arrived: Walker Pain Present Now: No Arrival Time: 15:01 Accompanied By: self Transfer Assistance: None Patient Identification Verified: Yes Secondary Verification Process Completed: Yes Patient Requires Transmission-Based Precautions: No Patient Has Alerts: No Electronic Signature(s) Signed: 09/14/2021 4:40:38 PM By: Rogers Blocker RN Entered By: Rogers Blocker on 09/14/2021 15:01:59 [name], [name] ([id number]) -------------------------------------------------------------------------------- Clinic Level of Care Assessment Details Patient Name: Eyster, Fynn L. Date of Service: 09/14/2021 2:45 PM Medical Record Number: [id number] Patient Account Number: [id number] Date of Birth/Sex: [birth year] (80 y.o. F) Treating RN: Rogers Blocker Primary Care Saraih Lorton: [name] Givens Other Clinician: Referring Gianne Shugars: [name] Givens Treating Emmanuelle Hibbitts/Extender: Rowan Blase in Treatment: 2 Clinic Level of Care Assessment Items TOOL 1 Quantity Score []  - Use when EandM and Procedure is performed on INITIAL visit 0 ASSESSMENTS - Nursing Assessment / Reassessment []  - General Physical Exam (combine w/ comprehensive assessment (listed just below) when performed on new 0 pt. evals) []  - 0 Comprehensive Assessment (HX, ROS, Risk Assessments, Wounds Hx, etc.) ASSESSMENTS - Wound and Skin Assessment / Reassessment []  - Dermatologic / Skin Assessment  (not related to wound area) 0 ASSESSMENTS - Ostomy and/or Continence Assessment and Care []  - Incontinence Assessment and Management 0 []  - 0 Ostomy Care Assessment and Management (repouching, etc.) PROCESS - Coordination of Care []  - Simple Patient / Family Education for ongoing care 0 []  - 0 Complex (extensive) Patient / Family Education for ongoing care []  - 0 Staff obtains Chiropractor, Records, Test Results / Process Orders []  - 0 Staff telephones HHA, Nursing Homes / Clarify orders / etc []  - 0 Routine Transfer to another Facility (non-emergent condition) []  - 0 Routine Hospital Admission (non-emergent condition) []  - 0 New Admissions / Manufacturing engineer / Ordering NPWT, Apligraf, etc. []  - 0 Emergency Hospital Admission (emergent condition) PROCESS - Special Needs []  - Pediatric / Minor Patient Management 0 []  - 0 Isolation Patient Management []  - 0 Hearing / Language / Visual special needs []  - 0 Assessment of Community assistance (transportation, D/C planning, etc.) []  - 0 Additional assistance / Altered mentation []  - 0 Support Surface(s) Assessment (bed, cushion, seat, etc.) INTERVENTIONS - Miscellaneous []  - External ear exam 0 []  - 0 Patient Transfer (multiple staff / Nurse, adult / Similar devices) []  - 0 Simple Staple / Suture removal (25 or less) []  - 0 Complex Staple / Suture removal (26 or more) []  - 0 Hypo/Hyperglycemic Management (do not check if billed separately) []  - 0 Ankle / Brachial Index (ABI) - do not check if billed separately Has the patient been seen at the hospital within the last three years: Yes Total Score: 0 Level Of Care: ____ [name] ([id number]) Electronic Signature(s) Signed: 09/14/2021 4:40:38 PM By: Rogers Blocker RN Entered By: Rogers Blocker on 09/14/2021 15:43:02 [name], [name] ([id number]) -------------------------------------------------------------------------------- Lower Extremity Assessment  Details Patient Name: Kersting, Breta L. Date of Service: 09/14/2021 2:45 PM Medical Record Number: [id number] Patient Account Number: [id number] Date of Birth/Sex: [birth year] (80 y.o. F) Treating RN: Rogers Blocker Primary Care Melessa Cowell: [name] Givens Other Clinician: Referring Yadira Hada:  [name] Givens Treating Lyell Clugston/Extender: Allen Derry Weeks in Treatment: 2 Edema Assessment Assessed: [Left: Yes] [Right: No] Edema: [Left: Ye] [Right: s] Calf Left: Right: Point of Measurement: 31 cm From Medial Instep 44.5 cm Ankle Left: Right: Point of Measurement: 11 cm From Medial Instep 28 cm Vascular Assessment Pulses: Dorsalis Pedis Palpable: [Left:Yes] Electronic Signature(s) Signed: 09/14/2021 4:40:38 PM By: Rogers Blocker RN Entered By: Rogers Blocker on 09/14/2021 15:09:45 [name], [name] ([id number]) -------------------------------------------------------------------------------- Multi Wound Chart Details Patient Name: Dlugosz, Lenah L. Date of Service: 09/14/2021 2:45 PM Medical Record Number: [id number] Patient Account Number: [id number] Date of Birth/Sex: 1941-09-04 (80 y.o. F) Treating RN: Rogers Blocker Primary Care Naiara Lombardozzi: [name] Givens Other Clinician: Referring Tareva Leske: [name] Givens Treating Marciana Uplinger/Extender: Rowan Blase in Treatment: 2 Vital Signs Height(in): 63 Pulse(bpm): 101 Weight(lbs): 198 Blood Pressure(mmHg): 191/94 Body Mass Index(BMI): 35 Temperature(F): 97.6 Respiratory Rate(breaths/min): 18 Photos: [N/A:N/A] Wound Location: Left, Anterior Lower Leg N/A N/A Wounding Event: Skin Tear/Laceration N/A N/A Primary Etiology: Venous Leg Ulcer N/A N/A Comorbid History: Cataracts, Osteoarthritis, Received N/A N/A Radiation Date Acquired: 07/30/2021 N/A N/A Weeks of Treatment: 2 N/A N/A Wound Status: Open N/A N/A Measurements L x W x D (cm) 2.4x0.7x0.2 N/A N/A Area (cm) : 1.319 N/A N/A Volume (cm) : 0.264 N/A N/A % Reduction in  Area: -4.90% N/A N/A % Reduction in Volume: 58.00% N/A N/A Classification: Full Thickness Without Exposed N/A N/A Support Structures Exudate Amount: Medium N/A N/A Exudate Type: Serosanguineous N/A N/A Exudate Color: red, brown N/A N/A Wound Margin: Flat and Intact N/A N/A Granulation Amount: Small (1-33%) N/A N/A Granulation Quality: Red N/A N/A Necrotic Amount: Large (67-100%) N/A N/A Exposed Structures: Fat Layer (Subcutaneous Tissue): N/A N/A Yes Fascia: No Tendon: No Muscle: No Joint: No Bone: No Epithelialization: Small (1-33%) N/A N/A Treatment Notes Electronic Signature(s) Signed: 09/14/2021 4:40:38 PM By: Rogers Blocker RN Entered By: Rogers Blocker on 09/14/2021 15:21:12 [name], [name] ([id number]) -------------------------------------------------------------------------------- Multi-Disciplinary Care Plan Details Patient Name: [name], [name] Date of Service: 09/14/2021 2:45 PM Medical Record Number: [id number] Patient Account Number: [id number] Date of Birth/Sex: 10/23/1941 (80 y.o. F) Treating RN: Rogers Blocker Primary Care Frederica Chrestman: [name] Givens Other Clinician: Referring Sherman Lipuma: [name] Givens Treating Billie Intriago/Extender: Rowan Blase in Treatment: 2 Active Inactive Necrotic Tissue Nursing Diagnoses: Impaired tissue integrity related to necrotic/devitalized tissue Goals: Necrotic/devitalized tissue will be minimized in the wound bed Date Initiated: 08/31/2021 Target Resolution Date: 08/31/2021 Goal Status: Active Patient/caregiver will verbalize understanding of reason and process for debridement of necrotic tissue Date Initiated: 08/31/2021 Target Resolution Date: 08/31/2021 Goal Status: Active Interventions: Assess patient pain level pre-, during and post procedure and prior to discharge Provide education on necrotic tissue and debridement process Treatment Activities: Apply topical anesthetic as ordered : 08/31/2021 Biologic  debridement : 08/31/2021 Enzymatic debridement : 08/31/2021 Excisional debridement : 08/31/2021 Notes: Wound/Skin Impairment Nursing Diagnoses: Impaired tissue integrity Goals: Patient/caregiver will verbalize understanding of skin care regimen Date Initiated: 08/31/2021 Target Resolution Date: 08/31/2021 Goal Status: Active Ulcer/skin breakdown will have a volume reduction of 30% by week 4 Date Initiated: 08/31/2021 Target Resolution Date: 10/01/2021 Goal Status: Active Ulcer/skin breakdown will have a volume reduction of 50% by week 8 Date Initiated: 08/31/2021 Target Resolution Date: 10/31/2021 Goal Status: Active Ulcer/skin breakdown will have a volume reduction of 80% by week 12 Date Initiated: 08/31/2021 Target Resolution Date: 12/01/2021 Goal Status: Active Ulcer/skin breakdown will heal within 14 weeks Date Initiated: 08/31/2021 Target Resolution Date: 01/01/2022 Goal Status: Active Interventions: Assess patient/caregiver ability to obtain  necessary supplies Assess patient/caregiver ability to perform ulcer/skin care regimen upon admission and as needed Assess ulceration(s) every visit Provide education on ulcer and skin care [name], [name] ([id number]) Treatment Activities: Referred to DME Jourdyn Hasler for dressing supplies : 08/31/2021 Skin care regimen initiated : 08/31/2021 Notes: Electronic Signature(s) Signed: 09/14/2021 4:40:38 PM By: Rogers Blocker RN Entered By: Rogers Blocker on 09/14/2021 15:21:04 [name], [name] ([id number]) -------------------------------------------------------------------------------- Pain Assessment Details Patient Name: Burkle, Eleonor L. Date of Service: 09/14/2021 2:45 PM Medical Record Number: [id number] Patient Account Number: [id number] Date of Birth/Sex: [birth year] (80 y.o. F) Treating RN: Rogers Blocker Primary Care Bartosz Luginbill: [name] Givens Other Clinician: Referring Virginia Curl: [name] Givens Treating Eleen Litz/Extender: Allen Derry Weeks in Treatment: 2 Active Problems Location of Pain Severity and Description of Pain Patient Has Paino No Site Locations Pain Management and Medication Current Pain Management: Electronic Signature(s) Signed: 09/14/2021 4:40:38 PM By: Rogers Blocker RN Entered By: Rogers Blocker on 09/14/2021 15:03:21 [name], [name] ([id number]) -------------------------------------------------------------------------------- Patient/Caregiver Education Details Patient Name: Dallman, Derrica L. Date of Service: 09/14/2021 2:45 PM Medical Record Number: [id number] Patient Account Number: [id number] Date of Birth/Gender: 1941/10/17 (80 y.o. F) Treating RN: Rogers Blocker Primary Care Physician: [name] Givens Other Clinician: Referring Physician: [name] Givens Treating Physician/Extender: Rowan Blase in Treatment: 2 Education Assessment Education Provided To: Patient Education Topics Provided Wound Debridement: Methods: Explain/Verbal Responses: State content correctly Wound/Skin Impairment: Methods: Explain/Verbal Responses: State content correctly Electronic Signature(s) Signed: 09/14/2021 4:40:38 PM By: Rogers Blocker RN Entered By: Rogers Blocker on 09/14/2021 15:43:24 [name], [name] ([id number]) -------------------------------------------------------------------------------- Wound Assessment Details Patient Name: [name], [name] Date of Service: 09/14/2021 2:45 PM Medical Record Number: [id number] Patient Account Number: [id number] Date of Birth/Sex: Oct 18, 1941 (80 y.o. F) Treating RN: Rogers Blocker Primary Care Kerina Simoneau: [name] Givens Other Clinician: Referring Jancarlos Thrun: [name] Givens Treating Majed Pellegrin/Extender: Allen Derry Weeks in Treatment: 2 Wound Status Wound Number: 1 Primary Etiology: Venous Leg Ulcer Wound Location: Left, Anterior Lower Leg Wound Status: Open Wounding Event: Skin Tear/Laceration Comorbid History: Cataracts, Osteoarthritis,  Received Radiation Date Acquired: 07/30/2021 Weeks Of Treatment: 2 Clustered Wound: No Photos Wound Measurements Length: (cm) 2.4 Width: (cm) 0.7 Depth: (cm) 0.2 Area: (cm) 1.319 Volume: (cm) 0.264 % Reduction in Area: -4.9% % Reduction in Volume: 58% Epithelialization: Small (1-33%) Tunneling: No Undermining: No Wound Description Classification: Full Thickness Without Exposed Support Structu Wound Margin: Flat and Intact Exudate Amount: Medium Exudate Type: Serosanguineous Exudate Color: red, brown res Foul Odor After Cleansing: No Slough/Fibrino Yes Wound Bed Granulation Amount: Small (1-33%) Exposed Structure Granulation Quality: Red Fascia Exposed: No Necrotic Amount: Large (67-100%) Fat Layer (Subcutaneous Tissue) Exposed: Yes Necrotic Quality: Adherent Slough Tendon Exposed: No Muscle Exposed: No Joint Exposed: No Bone Exposed: No Electronic Signature(s) Signed: 09/14/2021 4:40:38 PM By: Rogers Blocker RN Entered By: Rogers Blocker on 09/14/2021 15:08:16 Swartzlander, [name] ([id number]) -------------------------------------------------------------------------------- Vitals Details Patient Name: [name], [name] Date of Service: 09/14/2021 2:45 PM Medical Record Number: [id number] Patient Account Number: [id number] Date of Birth/Sex: 1941-08-02 (79 y.o. F) Treating RN: Rogers Blocker Primary Care Prescott Truex: [name] Givens Other Clinician: Referring Ezell Poke: [name] Givens Treating Kayra Crowell/Extender: Rowan Blase in Treatment: 2 Vital Signs Time Taken: 15:02 Temperature (F): 97.6 Height (in): 63 Pulse (bpm): 101 Weight (lbs): 198 Respiratory Rate (breaths/min): 18 Body Mass Index (BMI): 35.1 Blood Pressure (mmHg): 191/94 Reference Range: 80 - 120 mg / dl Electronic Signature(s) Signed: 09/14/2021 4:40:38 PM By: Rogers Blocker RN Entered By: Rogers Blocker on 09/14/2021 15:03:01

## 2021-09-14 NOTE — Progress Notes (Addendum)
[name] ([id number]) Visit Report for 09/14/2021 Chief Complaint Document Details Patient Name: [name], [name] Date of Service: 09/14/2021 2:45 PM Medical Record Number: [id number] Patient Account Number: [id number] Date of Birth/Sex: Nov 16, 1941 (80 y.o. F) Treating RN: Dolan Amen Primary Care Provider: [name] Other Clinician: Referring Provider: [name] Treating Provider/Extender: Skipper Cliche in Treatment: 2 Information Obtained from: Patient Chief Complaint Left LE Ulcer Electronic Signature(s) Signed: 09/14/2021 3:03:40 PM By: Worthy Keeler PA-C Entered By: Worthy Keeler on 09/14/2021 15:03:40 [name], [name] ([id number]) -------------------------------------------------------------------------------- Debridement Details Patient Name: [name], [name] Date of Service: 09/14/2021 2:45 PM Medical Record Number: [id number] Patient Account Number: [id number] Date of Birth/Sex: 07/11/1941 (80 y.o. F) Treating RN: Dolan Amen Primary Care Provider: [name] Other Clinician: Referring Provider: [name] Treating Provider/Extender: Skipper Cliche in Treatment: 2 Debridement Performed for Wound #1 Left,Anterior Lower Leg Assessment: Performed By: Physician Tommie Sams., PA-C Debridement Type: Debridement Severity of Tissue Pre Debridement: Fat layer exposed Level of Consciousness (Pre- Awake and Alert procedure): Pre-procedure Verification/Time Out Yes - 15:22 Taken: Start Time: 15:22 Total Area Debrided (L x W): 2.4 (cm) x 0.7 (cm) = 1.68 (cm) Tissue and other material Viable, Non-Viable, Slough, Subcutaneous, Slough debrided: Level: Skin/Subcutaneous Tissue Debridement Description: Excisional Instrument: Curette Bleeding: Minimum Hemostasis Achieved: Pressure Response to Treatment: Procedure was tolerated well Level of Consciousness (Post- Awake and Alert procedure): Post Debridement Measurements  of Total Wound Length: (cm) 2.4 Width: (cm) 0.7 Depth: (cm) 0.3 Volume: (cm) 0.396 Character of Wound/Ulcer Post Debridement: Stable Severity of Tissue Post Debridement: Fat layer exposed Post Procedure Diagnosis Same as Pre-procedure Electronic Signature(s) Signed: 09/14/2021 4:40:38 PM By: Dolan Amen RN Signed: 09/15/2021 4:28:58 PM By: Worthy Keeler PA-C Entered By: Dolan Amen on 09/14/2021 15:23:21 [name], [name] ([id number]) -------------------------------------------------------------------------------- HPI Details Patient Name: [name], [name] Date of Service: 09/14/2021 2:45 PM Medical Record Number: [id number] Patient Account Number: [id number] Date of Birth/Sex: [birth year] (80 y.o. F) Treating RN: Dolan Amen Primary Care Provider: [name] Other Clinician: Referring Provider: [name] Treating Provider/Extender: Skipper Cliche in Treatment: 2 History of Present Illness HPI Description: 08/31/2021 upon evaluation today patient presents for initial inspection here in our clinic concerning issues that she is having with a wound on her left anterior lower extremity. This is actually an area that she unfortunately tells me she was picking up a cage and dropped hitting the anterior portion of her shin causing a skin tear. She did not go to the hospital due to the fact that she did not realize that she would need to. This happened on July 30, 2021. With that being said the patient has been on Augmentin she has about 1 and half days of this left. Nonetheless she tells me that she does have a longstanding history of leg swelling. She does have chronic venous insufficiency as well it appears to me. She also has some evidence of may be early stages of lymphedema. She has a history of fibromyalgia, bilateral knee osteoarthritis, and appears to have a fungal infection of the right ankle region. In regard to the arthritis she tells me her legs tend  to swell less after she gets her knee injections allowing her to be able to walk and get around much more effectively. Following the injections she does well for a while and then when that seems to be wearing off she tends to start having issues with more swelling because she is more mobile that makes  sense from a venous standpoint. 10/10; I was asked to see this patient today who came in for a nurse visit. Firstly she has developed a widespread very pruritic rash involving her abdomen and back. Lesser extent on her feet. The second problem is that she is complaining about the tightness of the wound and wrap on the left leg where the wound is. 09/14/2021 upon evaluation today patient appears to be doing decently well in regard to her leg ulcer. The biggest issue she is having is a significant issue with a rash currently. I do not see any signs of active infection systemically which is great news although with regard to the rash she did see dermatology they told her she had eczema that does not seem to sit right with me out think that is mainly the issues she has going on. I do think she may benefit from the use of a short course of prednisone which should help with the rash which is absolutely driving her crazy. Electronic Signature(s) Signed: 09/14/2021 5:22:21 PM By: Worthy Keeler PA-C Entered By: Worthy Keeler on 09/14/2021 17:22:21 [name], [name] ([id number]) -------------------------------------------------------------------------------- Physical Exam Details Patient Name: [name], [name] Date of Service: 09/14/2021 2:45 PM Medical Record Number: [id number] Patient Account Number: [id number] Date of Birth/Sex: Aug 09, 1941 (80 y.o. F) Treating RN: Dolan Amen Primary Care Provider: [name] Other Clinician: Referring Provider: [name] Treating Provider/Extender: Jeri Cos Weeks in Treatment: 2 Constitutional Well-nourished and well-hydrated in no acute  distress. Respiratory normal breathing without difficulty. Psychiatric this patient is able to make decisions and demonstrates good insight into disease process. Alert and Oriented x 3. pleasant and cooperative. Notes Upon inspection patient's wound bed actually showed signs of good granulation and epithelization at this point. Fortunately there does not appear to be any evidence of infection I did perform debridement clear away some of the necrotic debris this did well although the wounds a little bit deeper I do think that Hydrofera Blue may do a little better for her ear. With regard to the rash this is quite extensive over her abdomen, back, legs, and arms. With that being said I think she may be having allergic reaction to something although I am not sure what. Electronic Signature(s) Signed: 09/14/2021 5:22:59 PM By: Worthy Keeler PA-C Entered By: Worthy Keeler on 09/14/2021 17:22:59 [name], [name] ([id number]) -------------------------------------------------------------------------------- Physician Orders Details Patient Name: [name], [name] Date of Service: 09/14/2021 2:45 PM Medical Record Number: [id number] Patient Account Number: [id number] Date of Birth/Sex: 03-Sep-1941 (80 y.o. F) Treating RN: Dolan Amen Primary Care Provider: [name] Other Clinician: Referring Provider: [name] Treating Provider/Extender: Skipper Cliche in Treatment: 2 Verbal / Phone Orders: No Diagnosis Coding ICD-10 Coding Code Description I87.2 Venous insufficiency (chronic) (peripheral) L97.822 Non-pressure chronic ulcer of other part of left lower leg with fat layer exposed M79.7 Fibromyalgia M17.0 Bilateral primary osteoarthritis of knee B35.4 Tinea corporis R21 Rash and other nonspecific skin eruption Follow-up Appointments o Return Appointment in 1 week. o Nurse Visit as needed - Twice a week Bathing/ Shower/ Hygiene o May shower with wound dressing  protected with water repellent cover or cast protector. o No tub bath. Edema Control - Lymphedema / Segmental Compressive Device / Other o Elevate, Exercise Daily and Avoid Standing for Long Periods of Time. o Elevate legs to the level of the heart and pump ankles as often as possible o Elevate leg(s) parallel to the floor when sitting. Medications-Please add to medication list.   o Other: - Start prednisone Wound Treatment Wound #1 - Lower Leg Wound Laterality: Left, Anterior Cleanser: Soap and Water 2 x Per Week/30 Days Discharge Instructions: Gently cleanse wound with antibacterial soap, rinse and pat dry prior to dressing wounds Peri-Wound Care: Triamcinolone Acetonide Cream, 0.1%, 15 (g) tube 2 x Per Week/30 Days Discharge Instructions: Apply to legs Primary Dressing: Hydrofera Blue Ready Transfer Foam, 2.5x2.5 (in/in) 2 x Per Week/30 Days Discharge Instructions: Cut Hydrofera Blue Ready and apply to wound bed, secure with left over hydrofera blue Secondary Dressing: ABD Pad 5x9 (in/in) 2 x Per Week/30 Days Discharge Instructions: Cover with ABD pad Secured With: Coban Cohesive Bandage 4x5 (yds) Stretched 2 x Per Week/30 Days Discharge Instructions: Apply Coban over kerlix to base of toes to 3-finger-widths below knee bend Secured With: Kerlix Roll Sterile or Non-Sterile 6-ply 4.5x4 (yd/yd) 2 x Per Week/30 Days Discharge Instructions: Apply Kerlix to base of toes to 3-finger-widths below knee bend Electronic Signature(s) Signed: 09/14/2021 4:40:38 PM By: Dolan Amen RN Signed: 09/15/2021 4:28:58 PM By: Worthy Keeler PA-C Previous Signature: 09/14/2021 3:35:03 PM Version By: Worthy Keeler PA-C Entered By: Dolan Amen on 09/14/2021 15:42:49 [name], [name] ([id number]) [name], [name] ([id number]) -------------------------------------------------------------------------------- Problem List Details Patient Name: [name], [name] Date of Service: 09/14/2021  2:45 PM Medical Record Number: [id number] Patient Account Number: [id number] Date of Birth/Sex: 1941/07/09 (80 y.o. F) Treating RN: Dolan Amen Primary Care Provider: [name] Other Clinician: Referring Provider: [name] Treating Provider/Extender: Jeri Cos Weeks in Treatment: 2 Active Problems ICD-10 Encounter Code Description Active Date MDM Diagnosis I87.2 Venous insufficiency (chronic) (peripheral) 08/31/2021 No Yes L97.822 Non-pressure chronic ulcer of other part of left lower leg with fat layer 08/31/2021 No Yes exposed M79.7 Fibromyalgia 08/31/2021 No Yes M17.0 Bilateral primary osteoarthritis of knee 08/31/2021 No Yes B35.4 Tinea corporis 08/31/2021 No Yes R21 Rash and other nonspecific skin eruption 09/04/2021 No Yes Inactive Problems Resolved Problems Electronic Signature(s) Signed: 09/14/2021 3:03:34 PM By: Worthy Keeler PA-C Entered By: Worthy Keeler on 09/14/2021 15:03:34 [place], [name] ([id number]) -------------------------------------------------------------------------------- Progress Note Details Patient Name: [name], [name] Date of Service: 09/14/2021 2:45 PM Medical Record Number: [id number] Patient Account Number: [id number] Date of Birth/Sex: [birth year] (80 y.o. F) Treating RN: Dolan Amen Primary Care Provider: [name] Other Clinician: Referring Provider: [name] Treating Provider/Extender: Skipper Cliche in Treatment: 2 Subjective Chief Complaint Information obtained from Patient Left LE Ulcer History of Present Illness (HPI) 08/31/2021 upon evaluation today patient presents for initial inspection here in our clinic concerning issues that she is having with a wound on her left anterior lower extremity. This is actually an area that she unfortunately tells me she was picking up a cage and dropped hitting the anterior portion of her shin causing a skin tear. She did not go to the hospital due to the fact that  she did not realize that she would need to. This happened on July 30, 2021. With that being said the patient has been on Augmentin she has about 1 and half days of this left. Nonetheless she tells me that she does have a longstanding history of leg swelling. She does have chronic venous insufficiency as well it appears to me. She also has some evidence of may be early stages of lymphedema. She has a history of fibromyalgia, bilateral knee osteoarthritis, and appears to have a fungal infection of the right ankle region. In regard to the arthritis she tells me her legs tend to  swell less after she gets her knee injections allowing her to be able to walk and get around much more effectively. Following the injections she does well for a while and then when that seems to be wearing off she tends to start having issues with more swelling because she is more mobile that makes sense from a venous standpoint. 10/10; I was asked to see this patient today who came in for a nurse visit. Firstly she has developed a widespread very pruritic rash involving her abdomen and back. Lesser extent on her feet. The second problem is that she is complaining about the tightness of the wound and wrap on the left leg where the wound is. 09/14/2021 upon evaluation today patient appears to be doing decently well in regard to her leg ulcer. The biggest issue she is having is a significant issue with a rash currently. I do not see any signs of active infection systemically which is great news although with regard to the rash she did see dermatology they told her she had eczema that does not seem to sit right with me out think that is mainly the issues she has going on. I do think she may benefit from the use of a short course of prednisone which should help with the rash which is absolutely driving her crazy. Objective Constitutional Well-nourished and well-hydrated in no acute distress. Vitals Time Taken: 3:02 PM, Height:  63 in, Weight: 198 lbs, BMI: 35.1, Temperature: 97.6 F, Pulse: 101 bpm, Respiratory Rate: 18 breaths/min, Blood Pressure: 191/94 mmHg. Respiratory normal breathing without difficulty. Psychiatric this patient is able to make decisions and demonstrates good insight into disease process. Alert and Oriented x 3. pleasant and cooperative. General Notes: Upon inspection patient's wound bed actually showed signs of good granulation and epithelization at this point. Fortunately there does not appear to be any evidence of infection I did perform debridement clear away some of the necrotic debris this did well although the wounds a little bit deeper I do think that Hydrofera Blue may do a little better for her ear. With regard to the rash this is quite extensive over her abdomen, back, legs, and arms. With that being said I think she may be having allergic reaction to something although I am not sure what. Integumentary (Hair, Skin) Wound #1 status is Open. Original cause of wound was Skin Tear/Laceration. The date acquired was: 07/30/2021. The wound has been in treatment 2 weeks. The wound is located on the Left,Anterior Lower Leg. The wound measures 2.4cm length x 0.7cm width x 0.2cm depth; 1.319cm^2 area and 0.264cm^3 volume. There is Fat Layer (Subcutaneous Tissue) exposed. There is no tunneling or undermining noted. There is a medium amount of serosanguineous drainage noted. The wound margin is flat and intact. There is small (1-33%) red granulation within the wound bed. There is a large (67-100%) amount of necrotic tissue within the wound bed including Adherent Slough. [name], [name] ([id number]) Assessment Active Problems ICD-10 Venous insufficiency (chronic) (peripheral) Non-pressure chronic ulcer of other part of left lower leg with fat layer exposed Fibromyalgia Bilateral primary osteoarthritis of knee Tinea corporis Rash and other nonspecific skin eruption Procedures Wound  #1 Pre-procedure diagnosis of Wound #1 is a Venous Leg Ulcer located on the Left,Anterior Lower Leg .Severity of Tissue Pre Debridement is: Fat layer exposed. There was a Excisional Skin/Subcutaneous Tissue Debridement with a total area of 1.68 sq cm performed by Tommie Sams., PA-C. With the following instrument(s): Curette to remove Viable and  Non-Viable tissue/material. Material removed includes Subcutaneous Tissue and Slough and. A time out was conducted at 15:22, prior to the start of the procedure. A Minimum amount of bleeding was controlled with Pressure. The procedure was tolerated well. Post Debridement Measurements: 2.4cm length x 0.7cm width x 0.3cm depth; 0.396cm^3 volume. Character of Wound/Ulcer Post Debridement is stable. Severity of Tissue Post Debridement is: Fat layer exposed. Post procedure Diagnosis Wound #1: Same as Pre-Procedure Plan Follow-up Appointments: Return Appointment in 1 week. Nurse Visit as needed - Twice a week Bathing/ Shower/ Hygiene: May shower with wound dressing protected with water repellent cover or cast protector. No tub bath. Edema Control - Lymphedema / Segmental Compressive Device / Other: Elevate, Exercise Daily and Avoid Standing for Long Periods of Time. Elevate legs to the level of the heart and pump ankles as often as possible Elevate leg(s) parallel to the floor when sitting. Medications-Please add to medication list.: Other: - Start prednisone WOUND #1: - Lower Leg Wound Laterality: Left, Anterior Cleanser: Soap and Water 2 x Per Week/30 Days Discharge Instructions: Gently cleanse wound with antibacterial soap, rinse and pat dry prior to dressing wounds Peri-Wound Care: Triamcinolone Acetonide Cream, 0.1%, 15 (g) tube 2 x Per Week/30 Days Discharge Instructions: Apply to legs Primary Dressing: Hydrofera Blue Ready Transfer Foam, 2.5x2.5 (in/in) 2 x Per Week/30 Days Discharge Instructions: Cut Hydrofera Blue Ready and apply to wound bed,  secure with left over hydrofera blue Secondary Dressing: ABD Pad 5x9 (in/in) 2 x Per Week/30 Days Discharge Instructions: Cover with ABD pad Secured With: Coban Cohesive Bandage 4x5 (yds) Stretched 2 x Per Week/30 Days Discharge Instructions: Apply Coban over kerlix to base of toes to 3-finger-widths below knee bend Secured With: [employer] Sterile or Non-Sterile 6-ply 4.5x4 (yd/yd) 2 x Per Week/30 Days Discharge Instructions: Apply Kerlix to base of toes to 3-finger-widths below knee bend 1. Would recommend currently that we go ahead and continue with the wound care measures as before with regard to [name] that will be the biggest change. 2. I am also can recommend that we continue with the Curlex and Coban wrap which I think is doing well. 3. I would also suggest that we go ahead and send in a prescription for the patient for prednisone to try to help out with this rash. I think that may be extremely helpful for her to try to keep the rash from spreading. She is in agreement with that plan. Hopefully she will be feeling a lot better come next week. We will see patient back for reevaluation in 1 week here in the clinic. If anything worsens or changes patient will contact our office for additional recommendations. ISSABELLE, MCRANEY ([id number]) Electronic Signature(s) Signed: 09/14/2021 5:23:42 PM By: Worthy Keeler PA-C Entered By: Worthy Keeler on 09/14/2021 17:23:41 [place], [name] ([id number]) -------------------------------------------------------------------------------- SuperBill Details Patient Name: Aitken, Rhian L. Date of Service: 09/14/2021 Medical Record Number: [id number] Patient Account Number: [id number] Date of Birth/Sex: Jun 25, 1941 (80 y.o. F) Treating RN: Dolan Amen Primary Care Provider: [name] Other Clinician: Referring Provider: [name] Treating Provider/Extender: Jeri Cos Weeks in Treatment: 2 Diagnosis Coding ICD-10  Codes Code Description I87.2 Venous insufficiency (chronic) (peripheral) L97.822 Non-pressure chronic ulcer of other part of left lower leg with fat layer exposed M79.7 Fibromyalgia M17.0 Bilateral primary osteoarthritis of knee B35.4 Tinea corporis R21 Rash and other nonspecific skin eruption Facility Procedures CPT4 Code: [id number] Description: 89211 - DEB SUBQ TISSUE 20 SQ CM/< Modifier: Quantity: 1 CPT4 Code:  Description: ICD-10 Diagnosis Description L97.822 Non-pressure chronic ulcer of other part of left lower leg with fat layer Modifier: exposed Quantity: Physician Procedures CPT4 Code: 9509326 Description: 71245 - WC PHYS LEVEL 4 - EST PT Modifier: 25 Quantity: 1 CPT4 Code: Description: ICD-10 Diagnosis Description I87.2 Venous insufficiency (chronic) (peripheral) L97.822 Non-pressure chronic ulcer of other part of left lower leg with fat layer M79.7 Fibromyalgia M17.0 Bilateral primary osteoarthritis of knee Modifier: exposed Quantity: CPT4 Code: 8099833 Description: 82505 - WC PHYS SUBQ TISS 20 SQ CM Modifier: Quantity: 1 CPT4 Code: Description: ICD-10 Diagnosis Description L97.822 Non-pressure chronic ulcer of other part of left lower leg with fat layer Modifier: exposed Quantity: Electronic Signature(s) Signed: 09/14/2021 5:23:58 PM By: Worthy Keeler PA-C Previous Signature: 09/14/2021 4:40:38 PM Version By: Dolan Amen RN Entered By: Worthy Keeler on 09/14/2021 17:23:58

## 2021-09-21 ENCOUNTER — Encounter: Payer: PPO | Admitting: Physician Assistant

## 2021-09-21 ENCOUNTER — Other Ambulatory Visit: Payer: Self-pay

## 2021-09-21 DIAGNOSIS — L97822 Non-pressure chronic ulcer of other part of left lower leg with fat layer exposed: Secondary | ICD-10-CM | POA: Diagnosis not present

## 2021-09-21 NOTE — Progress Notes (Addendum)
Exudate Color: red, brown N/A N/A Wound Margin: Flat and Intact N/A N/A Granulation Amount: Small (1-33%) N/A N/A Granulation Quality: Red N/A N/A Necrotic Amount: Large (67-100%) N/A N/A Exposed Structures: Fat Layer (Subcutaneous Tissue): N/A N/A Yes Fascia: No Tendon: No Muscle: No Joint: No Bone: No Epithelialization: Small (1-33%) N/A N/A Treatment Notes Electronic Signature(s) Signed: 09/21/2021 4:22:59 PM By: Donnamarie Poag Entered By: Donnamarie Poag on 09/21/2021 15:22:06 Sheeley, [name] ([id number]) -------------------------------------------------------------------------------- Multi-Disciplinary Care Plan Details Patient Name: Mccubbins, Latise L. Date of Service: 09/21/2021 2:30 PM Medical Record Number: [id number] Patient Account Number: [id number] Date of Birth/Sex: 1941-11-07 (80 y.o. F) Treating RN: Donnamarie Poag Primary Care Seaborn Nakama: [name] Other Clinician: Referring Kiriana Worthington: [name] Treating Kathleena Freeman/Extender: Skipper Cliche in Treatment: 3 Active Inactive Necrotic Tissue Nursing Diagnoses: Impaired tissue integrity related to necrotic/devitalized tissue Goals: Necrotic/devitalized tissue will be minimized in the wound bed Date Initiated: 08/31/2021 Target Resolution Date: 08/31/2021 Goal Status: Active Patient/caregiver will verbalize understanding of  reason and process for debridement of necrotic tissue Date Initiated: 08/31/2021 Date Inactivated: 09/21/2021 Target Resolution Date: 08/31/2021 Goal Status: Met Interventions: Assess patient pain level pre-, during and post procedure and prior to discharge Provide education on necrotic tissue and debridement process Treatment Activities: Apply topical anesthetic as ordered : 08/31/2021 Biologic debridement : 08/31/2021 Enzymatic debridement : 08/31/2021 Excisional debridement : 08/31/2021 Notes: Wound/Skin Impairment Nursing Diagnoses: Impaired tissue integrity Goals: Patient/caregiver will verbalize understanding of skin care regimen Date Initiated: 08/31/2021 Date Inactivated: 09/21/2021 Target Resolution Date: 08/31/2021 Goal Status: Met Ulcer/skin breakdown will have a volume reduction of 30% by week 4 Date Initiated: 08/31/2021 Target Resolution Date: 10/01/2021 Goal Status: Active Ulcer/skin breakdown will have a volume reduction of 50% by week 8 Date Initiated: 08/31/2021 Target Resolution Date: 10/31/2021 Goal Status: Active Ulcer/skin breakdown will have a volume reduction of 80% by week 12 Date Initiated: 08/31/2021 Target Resolution Date: 12/01/2021 Goal Status: Active Ulcer/skin breakdown will heal within 14 weeks Date Initiated: 08/31/2021 Target Resolution Date: 01/01/2022 Goal Status: Active Interventions: Assess patient/caregiver ability to obtain necessary supplies Assess patient/caregiver ability to perform ulcer/skin care regimen upon admission and as needed Assess ulceration(s) every visit Provide education on ulcer and skin care ASSIA, MEANOR ([id number]) Treatment Activities: Referred to DME Rilley Poulter for dressing supplies : 08/31/2021 Skin care regimen initiated : 08/31/2021 Notes: Electronic Signature(s) Signed: 09/21/2021 4:22:59 PM By: Donnamarie Poag Entered By: Donnamarie Poag on 09/21/2021 15:21:55 Eckman, Odis L.  ([id number]) -------------------------------------------------------------------------------- Pain Assessment Details Patient Name: Lueth, Masey L. Date of Service: 09/21/2021 2:30 PM Medical Record Number: [id number] Patient Account Number: [id number] Date of Birth/Sex: 11-Aug-1941 (80 y.o. F) Treating RN: Donnamarie Poag Primary Care Athina Fahey: [name] Other Clinician: Referring London Tarnowski: [name] Treating Japji Kok/Extender: Skipper Cliche in Treatment: 3 Active Problems Location of Pain Severity and Description of Pain Patient Has Paino Yes Site Locations Pain Location: Pain in Ulcers Rate the pain. Current Pain Level: 6 Pain Management and Medication Current Pain Management: Electronic Signature(s) Signed: 09/21/2021 3:14:11 PM By: Donnamarie Poag Entered By: Donnamarie Poag on 09/21/2021 14:57:12 [place], [name] ([id number]) -------------------------------------------------------------------------------- Patient/Caregiver Education Details Patient Name: Frankland, Jalah L. Date of Service: 09/21/2021 2:30 PM Medical Record Number: [id number] Patient Account Number: [id number] Date of Birth/Gender: [birth year] (80 y.o. F) Treating RN: Donnamarie Poag Primary Care Physician: [name] Other Clinician: Referring Physician: [name] Treating Physician/Extender: Skipper Cliche in Treatment: 3 Education Assessment Education Provided To: Patient Education Topics Provided Basic Hygiene: Nutrition: Wound Debridement: Wound/Skin Impairment: Electronic Signature(s)  Exudate Color: red, brown N/A N/A Wound Margin: Flat and Intact N/A N/A Granulation Amount: Small (1-33%) N/A N/A Granulation Quality: Red N/A N/A Necrotic Amount: Large (67-100%) N/A N/A Exposed Structures: Fat Layer (Subcutaneous Tissue): N/A N/A Yes Fascia: No Tendon: No Muscle: No Joint: No Bone: No Epithelialization: Small (1-33%) N/A N/A Treatment Notes Electronic Signature(s) Signed: 09/21/2021 4:22:59 PM By: Donnamarie Poag Entered By: Donnamarie Poag on 09/21/2021 15:22:06 Sheeley, [name] ([id number]) -------------------------------------------------------------------------------- Multi-Disciplinary Care Plan Details Patient Name: Mccubbins, Latise L. Date of Service: 09/21/2021 2:30 PM Medical Record Number: [id number] Patient Account Number: [id number] Date of Birth/Sex: 1941-11-07 (80 y.o. F) Treating RN: Donnamarie Poag Primary Care Seaborn Nakama: [name] Other Clinician: Referring Kiriana Worthington: [name] Treating Kathleena Freeman/Extender: Skipper Cliche in Treatment: 3 Active Inactive Necrotic Tissue Nursing Diagnoses: Impaired tissue integrity related to necrotic/devitalized tissue Goals: Necrotic/devitalized tissue will be minimized in the wound bed Date Initiated: 08/31/2021 Target Resolution Date: 08/31/2021 Goal Status: Active Patient/caregiver will verbalize understanding of  reason and process for debridement of necrotic tissue Date Initiated: 08/31/2021 Date Inactivated: 09/21/2021 Target Resolution Date: 08/31/2021 Goal Status: Met Interventions: Assess patient pain level pre-, during and post procedure and prior to discharge Provide education on necrotic tissue and debridement process Treatment Activities: Apply topical anesthetic as ordered : 08/31/2021 Biologic debridement : 08/31/2021 Enzymatic debridement : 08/31/2021 Excisional debridement : 08/31/2021 Notes: Wound/Skin Impairment Nursing Diagnoses: Impaired tissue integrity Goals: Patient/caregiver will verbalize understanding of skin care regimen Date Initiated: 08/31/2021 Date Inactivated: 09/21/2021 Target Resolution Date: 08/31/2021 Goal Status: Met Ulcer/skin breakdown will have a volume reduction of 30% by week 4 Date Initiated: 08/31/2021 Target Resolution Date: 10/01/2021 Goal Status: Active Ulcer/skin breakdown will have a volume reduction of 50% by week 8 Date Initiated: 08/31/2021 Target Resolution Date: 10/31/2021 Goal Status: Active Ulcer/skin breakdown will have a volume reduction of 80% by week 12 Date Initiated: 08/31/2021 Target Resolution Date: 12/01/2021 Goal Status: Active Ulcer/skin breakdown will heal within 14 weeks Date Initiated: 08/31/2021 Target Resolution Date: 01/01/2022 Goal Status: Active Interventions: Assess patient/caregiver ability to obtain necessary supplies Assess patient/caregiver ability to perform ulcer/skin care regimen upon admission and as needed Assess ulceration(s) every visit Provide education on ulcer and skin care ASSIA, MEANOR ([id number]) Treatment Activities: Referred to DME Rilley Poulter for dressing supplies : 08/31/2021 Skin care regimen initiated : 08/31/2021 Notes: Electronic Signature(s) Signed: 09/21/2021 4:22:59 PM By: Donnamarie Poag Entered By: Donnamarie Poag on 09/21/2021 15:21:55 Eckman, Odis L.  ([id number]) -------------------------------------------------------------------------------- Pain Assessment Details Patient Name: Lueth, Masey L. Date of Service: 09/21/2021 2:30 PM Medical Record Number: [id number] Patient Account Number: [id number] Date of Birth/Sex: 11-Aug-1941 (80 y.o. F) Treating RN: Donnamarie Poag Primary Care Athina Fahey: [name] Other Clinician: Referring London Tarnowski: [name] Treating Japji Kok/Extender: Skipper Cliche in Treatment: 3 Active Problems Location of Pain Severity and Description of Pain Patient Has Paino Yes Site Locations Pain Location: Pain in Ulcers Rate the pain. Current Pain Level: 6 Pain Management and Medication Current Pain Management: Electronic Signature(s) Signed: 09/21/2021 3:14:11 PM By: Donnamarie Poag Entered By: Donnamarie Poag on 09/21/2021 14:57:12 [place], [name] ([id number]) -------------------------------------------------------------------------------- Patient/Caregiver Education Details Patient Name: Frankland, Jalah L. Date of Service: 09/21/2021 2:30 PM Medical Record Number: [id number] Patient Account Number: [id number] Date of Birth/Gender: [birth year] (80 y.o. F) Treating RN: Donnamarie Poag Primary Care Physician: [name] Other Clinician: Referring Physician: [name] Treating Physician/Extender: Skipper Cliche in Treatment: 3 Education Assessment Education Provided To: Patient Education Topics Provided Basic Hygiene: Nutrition: Wound Debridement: Wound/Skin Impairment: Electronic Signature(s)  0 Complex Wound Measurement - multiple wounds INTERVENTIONS - Wound Dressings X - Small Wound Dressing one or multiple wounds 1 10 '[]'  - 0 Medium Wound Dressing one or multiple wounds '[]'  - 0 Large Wound Dressing one or multiple wounds X- 1 5 Application of Medications - topical '[]'  - 0 Application of Medications - injection INTERVENTIONS - Miscellaneous '[]'  - External ear exam 0 '[]'  - 0 Specimen Collection (cultures, biopsies, blood, body fluids, etc.) '[]'  - 0 Specimen(s) / Culture(s) sent or taken to Lab for analysis '[]'  - 0 Patient Transfer (multiple staff / Civil Service fast streamer / Similar devices) '[]'  - 0 Simple Staple / Suture removal (25 or less) '[]'  - 0 Complex Staple / Suture removal (26 or more) '[]'  - 0 Hypo / Hyperglycemic Management (close monitor of Blood Glucose) '[]'  - 0 Ankle / Brachial Index (ABI) - do not check if billed separately X- 1 5 Vital Signs Has the patient been seen at the hospital within the last three years: Yes Total Score: 90 Level Of Care: New/Established - Level 3 Electronic Signature(s) Signed: 09/21/2021 4:22:59 PM By: Donnamarie Poag Entered By: Donnamarie Poag on 09/21/2021 15:39:43 Houchin, [name] ([id number]) -------------------------------------------------------------------------------- Encounter Discharge Information Details Patient Name: Shadden, Haliey L. Date of Service: 09/21/2021 2:30 PM Medical Record Number: [id number] Patient Account Number: [id number] Date of Birth/Sex: 03-22-1941 (80 y.o. F) Treating RN: Donnamarie Poag Primary Care Conan Mcmanaway: [name] Other Clinician: Referring Tanetta Fuhriman: [name] Treating Rhiannon Sassaman/Extender: Skipper Cliche in Treatment: 3 Encounter Discharge Information Items Discharge Condition: Stable Ambulatory Status:  Walker Discharge Destination: Home Transportation: Private Auto Accompanied By: self Schedule Follow-up Appointment: Yes Clinical Summary of Care: Electronic Signature(s) Signed: 09/21/2021 4:22:59 PM By: Donnamarie Poag Entered By: Donnamarie Poag on 09/21/2021 15:42:07 [place]inf[place], [name] ([id number]) -------------------------------------------------------------------------------- Lower Extremity Assessment Details Patient Name: Crumby, Zeppelin L. Date of Service: 09/21/2021 2:30 PM Medical Record Number: [id number] Patient Account Number: [id number] Date of Birth/Sex: 06/16/1941 (80 y.o. F) Treating RN: Donnamarie Poag Primary Care Chrysta Fulcher: [name] Other Clinician: Referring Chistine Dematteo: [name] Treating Sheetal Lyall/Extender: Jeri Cos Weeks in Treatment: 3 Edema Assessment Assessed: Shirlyn Goltz: Yes] [Right: No] Edema: [Left: Ye] [Right: s] Calf Left: Right: Point of Measurement: 31 cm From Medial Instep 43 cm Ankle Left: Right: Point of Measurement: 11 cm From Medial Instep 28 cm Vascular Assessment Pulses: Dorsalis Pedis Palpable: [Left:Yes] Electronic Signature(s) Signed: 09/21/2021 3:14:11 PM By: Donnamarie Poag Entered By: Donnamarie Poag on 09/21/2021 15:00:45 Hayward, Tamura L. ([id number]) -------------------------------------------------------------------------------- Multi Wound Chart Details Patient Name: [place], Zaydee L. Date of Service: 09/21/2021 2:30 PM Medical Record Number: [id number] Patient Account Number: [id number] Date of Birth/Sex: [birth year] (80 y.o. F) Treating RN: Donnamarie Poag Primary Care Janes Colegrove: [name] Other Clinician: Referring Ayn Domangue: [name] Treating Jerelene Salaam/Extender: Skipper Cliche in Treatment: 3 Vital Signs Height(in): 63 Pulse(bpm): 49 Weight(lbs): 198 Blood Pressure(mmHg): 190/92 Body Mass Index(BMI): 35 Temperature(F): 97.8 Respiratory Rate(breaths/min): 18 Photos: [N/A:N/A] Wound Location: Left, Anterior  Lower Leg N/A N/A Wounding Event: Skin Tear/Laceration N/A N/A Primary Etiology: Venous Leg Ulcer N/A N/A Comorbid History: Cataracts, Osteoarthritis, Received N/A N/A Radiation Date Acquired: 07/30/2021 N/A N/A Weeks of Treatment: 3 N/A N/A Wound Status: Open N/A N/A Measurements L x W x D (cm) 1.9x0.5x0.3 N/A N/A Area (cm) : 0.746 N/A N/A Volume (cm) : 0.224 N/A N/A % Reduction in Area: 40.70% N/A N/A % Reduction in Volume: 64.30% N/A N/A Classification: Full Thickness Without Exposed N/A N/A Support Structures Exudate Amount: Medium N/A N/A Exudate Type: Serosanguineous N/A N/A  Exudate Color: red, brown N/A N/A Wound Margin: Flat and Intact N/A N/A Granulation Amount: Small (1-33%) N/A N/A Granulation Quality: Red N/A N/A Necrotic Amount: Large (67-100%) N/A N/A Exposed Structures: Fat Layer (Subcutaneous Tissue): N/A N/A Yes Fascia: No Tendon: No Muscle: No Joint: No Bone: No Epithelialization: Small (1-33%) N/A N/A Treatment Notes Electronic Signature(s) Signed: 09/21/2021 4:22:59 PM By: Donnamarie Poag Entered By: Donnamarie Poag on 09/21/2021 15:22:06 Sheeley, [name] ([id number]) -------------------------------------------------------------------------------- Multi-Disciplinary Care Plan Details Patient Name: Mccubbins, Latise L. Date of Service: 09/21/2021 2:30 PM Medical Record Number: [id number] Patient Account Number: [id number] Date of Birth/Sex: 1941-11-07 (80 y.o. F) Treating RN: Donnamarie Poag Primary Care Seaborn Nakama: [name] Other Clinician: Referring Kiriana Worthington: [name] Treating Kathleena Freeman/Extender: Skipper Cliche in Treatment: 3 Active Inactive Necrotic Tissue Nursing Diagnoses: Impaired tissue integrity related to necrotic/devitalized tissue Goals: Necrotic/devitalized tissue will be minimized in the wound bed Date Initiated: 08/31/2021 Target Resolution Date: 08/31/2021 Goal Status: Active Patient/caregiver will verbalize understanding of  reason and process for debridement of necrotic tissue Date Initiated: 08/31/2021 Date Inactivated: 09/21/2021 Target Resolution Date: 08/31/2021 Goal Status: Met Interventions: Assess patient pain level pre-, during and post procedure and prior to discharge Provide education on necrotic tissue and debridement process Treatment Activities: Apply topical anesthetic as ordered : 08/31/2021 Biologic debridement : 08/31/2021 Enzymatic debridement : 08/31/2021 Excisional debridement : 08/31/2021 Notes: Wound/Skin Impairment Nursing Diagnoses: Impaired tissue integrity Goals: Patient/caregiver will verbalize understanding of skin care regimen Date Initiated: 08/31/2021 Date Inactivated: 09/21/2021 Target Resolution Date: 08/31/2021 Goal Status: Met Ulcer/skin breakdown will have a volume reduction of 30% by week 4 Date Initiated: 08/31/2021 Target Resolution Date: 10/01/2021 Goal Status: Active Ulcer/skin breakdown will have a volume reduction of 50% by week 8 Date Initiated: 08/31/2021 Target Resolution Date: 10/31/2021 Goal Status: Active Ulcer/skin breakdown will have a volume reduction of 80% by week 12 Date Initiated: 08/31/2021 Target Resolution Date: 12/01/2021 Goal Status: Active Ulcer/skin breakdown will heal within 14 weeks Date Initiated: 08/31/2021 Target Resolution Date: 01/01/2022 Goal Status: Active Interventions: Assess patient/caregiver ability to obtain necessary supplies Assess patient/caregiver ability to perform ulcer/skin care regimen upon admission and as needed Assess ulceration(s) every visit Provide education on ulcer and skin care ASSIA, MEANOR ([id number]) Treatment Activities: Referred to DME Rilley Poulter for dressing supplies : 08/31/2021 Skin care regimen initiated : 08/31/2021 Notes: Electronic Signature(s) Signed: 09/21/2021 4:22:59 PM By: Donnamarie Poag Entered By: Donnamarie Poag on 09/21/2021 15:21:55 Eckman, Odis L.  ([id number]) -------------------------------------------------------------------------------- Pain Assessment Details Patient Name: Lueth, Masey L. Date of Service: 09/21/2021 2:30 PM Medical Record Number: [id number] Patient Account Number: [id number] Date of Birth/Sex: 11-Aug-1941 (80 y.o. F) Treating RN: Donnamarie Poag Primary Care Athina Fahey: [name] Other Clinician: Referring London Tarnowski: [name] Treating Japji Kok/Extender: Skipper Cliche in Treatment: 3 Active Problems Location of Pain Severity and Description of Pain Patient Has Paino Yes Site Locations Pain Location: Pain in Ulcers Rate the pain. Current Pain Level: 6 Pain Management and Medication Current Pain Management: Electronic Signature(s) Signed: 09/21/2021 3:14:11 PM By: Donnamarie Poag Entered By: Donnamarie Poag on 09/21/2021 14:57:12 [place], [name] ([id number]) -------------------------------------------------------------------------------- Patient/Caregiver Education Details Patient Name: Frankland, Jalah L. Date of Service: 09/21/2021 2:30 PM Medical Record Number: [id number] Patient Account Number: [id number] Date of Birth/Gender: [birth year] (80 y.o. F) Treating RN: Donnamarie Poag Primary Care Physician: [name] Other Clinician: Referring Physician: [name] Treating Physician/Extender: Skipper Cliche in Treatment: 3 Education Assessment Education Provided To: Patient Education Topics Provided Basic Hygiene: Nutrition: Wound Debridement: Wound/Skin Impairment: Electronic Signature(s)

## 2021-09-21 NOTE — Progress Notes (Addendum)
[name], [name] ([id number]) Visit Report for 09/21/2021 Chief Complaint Document Details Patient Name: [name], [name] Date of Service: 09/21/2021 2:30 PM Medical Record Number: [id number] Patient Account Number: [id number] Date of Birth/Sex: 1941-07-09 (80 y.o. F) Treating RN: Donnamarie Poag Primary Care Provider: [name] Other Clinician: Referring Provider: [name] Treating Provider/Extender: Skipper Cliche in Treatment: 3 Information Obtained from: Patient Chief Complaint Left LE Ulcer Electronic Signature(s) Signed: 09/21/2021 2:38:28 PM By: Worthy Keeler PA-C Entered By: Worthy Keeler on 09/21/2021 14:38:28 Kosiba, [name] ([id number]) -------------------------------------------------------------------------------- HPI Details Patient Name: [name], [name] Date of Service: 09/21/2021 2:30 PM Medical Record Number: [id number] Patient Account Number: [id number] Date of Birth/Sex: 05/01/1941 (80 y.o. F) Treating RN: Donnamarie Poag Primary Care Provider: [name] Other Clinician: Referring Provider: [name] Treating Provider/Extender: Skipper Cliche in Treatment: 3 History of Present Illness HPI Description: 08/31/2021 upon evaluation today patient presents for initial inspection here in our clinic concerning issues that she is having with a wound on her left anterior lower extremity. This is actually an area that she unfortunately tells me she was picking up a cage and dropped hitting the anterior portion of her shin causing a skin tear. She did not go to the hospital due to the fact that she did not realize that she would need to. This happened on July 30, 2021. With that being said the patient has been on Augmentin she has about 1 and half days of this left. Nonetheless she tells me that she does have a longstanding history of leg swelling. She does have chronic venous insufficiency as well it appears to me. She also has some  evidence of may be early stages of lymphedema. She has a history of fibromyalgia, bilateral knee osteoarthritis, and appears to have a fungal infection of the right ankle region. In regard to the arthritis she tells me her legs tend to swell less after she gets her knee injections allowing her to be able to walk and get around much more effectively. Following the injections she does well for a while and then when that seems to be wearing off she tends to start having issues with more swelling because she is more mobile that makes sense from a venous standpoint. 10/10; I was asked to see this patient today who came in for a nurse visit. Firstly she has developed a widespread very pruritic rash involving her abdomen and back. Lesser extent on her feet. The second problem is that she is complaining about the tightness of the wound and wrap on the left leg where the wound is. 09/14/2021 upon evaluation today patient appears to be doing decently well in regard to her leg ulcer. The biggest issue she is having is a significant issue with a rash currently. I do not see any signs of active infection systemically which is great news although with regard to the rash she did see dermatology they told her she had eczema that does not seem to sit right with me out think that is mainly the issues she has going on. I do think she may benefit from the use of a short course of prednisone which should help with the rash which is absolutely driving her crazy. 09/21/2021 upon evaluation today patient appears to be doing decently well in regard to her wound this is still pretty deep. The prednisone has helped with the rash although is not completely done and it is doing much better than what it was. Fortunately there is  no signs of active infection systemically at this time also great news. Electronic Signature(s) Signed: 09/21/2021 3:33:28 PM By: Worthy Keeler PA-C Entered By: Worthy Keeler on 09/21/2021  15:33:27 Carnero, [name] ([id number]) -------------------------------------------------------------------------------- Physical Exam Details Patient Name: [name], [name] Date of Service: 09/21/2021 2:30 PM Medical Record Number: [id number] Patient Account Number: [id number] Date of Birth/Sex: December 24, 1940 (80 y.o. F) Treating RN: Donnamarie Poag Primary Care Provider: [name] Other Clinician: Referring Provider: [name] Treating Provider/Extender: Jeri Cos Weeks in Treatment: 3 Constitutional Well-nourished and well-hydrated in no acute distress. Psychiatric this patient is able to make decisions and demonstrates good insight into disease process. Alert and Oriented x 3. pleasant and cooperative. Notes Patient's wound bed showed signs of good granulation epithelization at this point. I do think the Hydrofera Blue has done well I do think we need to make sure to talk a little bit down into the wound and will cover with a second piece secure with roll gauze. Regular try Tubigrip instead of the compression wrap as she keeps cutting the wrap down anyway as getting up causing more of a problem than helping in any good way. Electronic Signature(s) Signed: 09/21/2021 3:33:50 PM By: Worthy Keeler PA-C Entered By: Worthy Keeler on 09/21/2021 15:33:50 Riebe, [name] ([id number]) -------------------------------------------------------------------------------- Physician Orders Details Patient Name: [name], [name] Date of Service: 09/21/2021 2:30 PM Medical Record Number: [id number] Patient Account Number: [id number] Date of Birth/Sex: 04-13-1941 (80 y.o. F) Treating RN: Donnamarie Poag Primary Care Provider: [name] Other Clinician: Referring Provider: [name] Treating Provider/Extender: Skipper Cliche in Treatment: 3 Verbal / Phone Orders: No Diagnosis Coding ICD-10 Coding Code Description I87.2 Venous insufficiency (chronic)  (peripheral) L97.822 Non-pressure chronic ulcer of other part of left lower leg with fat layer exposed M79.7 Fibromyalgia M17.0 Bilateral primary osteoarthritis of knee B35.4 Tinea corporis R21 Rash and other nonspecific skin eruption Follow-up Appointments o Return Appointment in 1 week. o Nurse Visit as needed - Twice a week Bathing/ Shower/ Hygiene o May shower with wound dressing protected with water repellent cover or cast protector. o No tub bath. Edema Control - Lymphedema / Segmental Compressive Device / Other o Tubigrip single layer applied. o Elevate, Exercise Daily and Avoid Standing for Long Periods of Time. o Elevate legs to the level of the heart and pump ankles as often as possible o Elevate leg(s) parallel to the floor when sitting. Medications-Please add to medication list. o Other: - finish prednisone as prescribed Try Allegra, Claritin or Zyrtec for allergic skin reactions Wound Treatment Wound #1 - Lower Leg Wound Laterality: Left, Anterior Cleanser: Soap and Water 2 x Per Week/30 Days Discharge Instructions: Gently cleanse wound with antibacterial soap, rinse and pat dry prior to dressing wounds Peri-Wound Care: Triamcinolone Acetonide Cream, 0.1%, 15 (g) tube 2 x Per Week/30 Days Discharge Instructions: Apply to legs Primary Dressing: Hydrofera Blue Ready Transfer Foam, 2.5x2.5 (in/in) 2 x Per Week/30 Days Discharge Instructions: Cut Hydrofera Blue Ready and apply to wound bed, secure with left over hydrofera blue Secondary Dressing: ABD Pad 5x9 (in/in) 2 x Per Week/30 Days Discharge Instructions: Cover with ABD pad Secured With: Kerlix Roll Sterile or Non-Sterile 6-ply 4.5x4 (yd/yd) 2 x Per Week/30 Days Discharge Instructions: Apply Kerlix to base of toes to 3-finger-widths below knee bend Compression Wrap: Tubi E 2 x Per Week/30 Days Electronic Signature(s) Signed: 09/21/2021 4:22:59 PM By: Donnamarie Poag Signed: 09/21/2021 4:43:43 PM By:  Worthy Keeler PA-C Entered By: Donnamarie Poag  on 09/21/2021 15:32:26 Suchecki, ALYVIA DERK ([id number]) Ninneman, Eupha L. ([id number]) -------------------------------------------------------------------------------- Problem List Details Patient Name: Wageman, Ruchel L. Date of Service: 09/21/2021 2:30 PM Medical Record Number: [id number] Patient Account Number: [id number] Date of Birth/Sex: 11/26/1941 (80 y.o. F) Treating RN: Donnamarie Poag Primary Care Provider: [name] Other Clinician: Referring Provider: [name] Treating Provider/Extender: Skipper Cliche in Treatment: 3 Active Problems ICD-10 Encounter Code Description Active Date MDM Diagnosis I87.2 Venous insufficiency (chronic) (peripheral) 08/31/2021 No Yes L97.822 Non-pressure chronic ulcer of other part of left lower leg with fat layer 08/31/2021 No Yes exposed M79.7 Fibromyalgia 08/31/2021 No Yes M17.0 Bilateral primary osteoarthritis of knee 08/31/2021 No Yes B35.4 Tinea corporis 08/31/2021 No Yes R21 Rash and other nonspecific skin eruption 09/04/2021 No Yes Inactive Problems Resolved Problems Electronic Signature(s) Signed: 09/21/2021 2:38:23 PM By: Worthy Keeler PA-C Entered By: Worthy Keeler on 09/21/2021 14:38:22 Grisso, [name] ([id number]) -------------------------------------------------------------------------------- Progress Note Details Patient Name: Tyndall, Lanyiah L. Date of Service: 09/21/2021 2:30 PM Medical Record Number: [id number] Patient Account Number: [id number] Date of Birth/Sex: 10/01/1941 (80 y.o. F) Treating RN: Donnamarie Poag Primary Care Provider: [name] Other Clinician: Referring Provider: [name] Treating Provider/Extender: Skipper Cliche in Treatment: 3 Subjective Chief Complaint Information obtained from Patient Left LE Ulcer History of Present Illness (HPI) 08/31/2021 upon evaluation today patient presents for initial inspection here in our clinic  concerning issues that she is having with a wound on her left anterior lower extremity. This is actually an area that she unfortunately tells me she was picking up a cage and dropped hitting the anterior portion of her shin causing a skin tear. She did not go to the hospital due to the fact that she did not realize that she would need to. This happened on July 30, 2021. With that being said the patient has been on Augmentin she has about 1 and half days of this left. Nonetheless she tells me that she does have a longstanding history of leg swelling. She does have chronic venous insufficiency as well it appears to me. She also has some evidence of may be early stages of lymphedema. She has a history of fibromyalgia, bilateral knee osteoarthritis, and appears to have a fungal infection of the right ankle region. In regard to the arthritis she tells me her legs tend to swell less after she gets her knee injections allowing her to be able to walk and get around much more effectively. Following the injections she does well for a while and then when that seems to be wearing off she tends to start having issues with more swelling because she is more mobile that makes sense from a venous standpoint. 10/10; I was asked to see this patient today who came in for a nurse visit. Firstly she has developed a widespread very pruritic rash involving her abdomen and back. Lesser extent on her feet. The second problem is that she is complaining about the tightness of the wound and wrap on the left leg where the wound is. 09/14/2021 upon evaluation today patient appears to be doing decently well in regard to her leg ulcer. The biggest issue she is having is a significant issue with a rash currently. I do not see any signs of active infection systemically which is great news although with regard to the rash she did see dermatology they told her she had eczema that does not seem to sit right with me out think that is  mainly the issues she has  going on. I do think she may benefit from the use of a short course of prednisone which should help with the rash which is absolutely driving her crazy. 09/21/2021 upon evaluation today patient appears to be doing decently well in regard to her wound this is still pretty deep. The prednisone has helped with the rash although is not completely done and it is doing much better than what it was. Fortunately there is no signs of active infection systemically at this time also great news. Objective Constitutional Well-nourished and well-hydrated in no acute distress. Vitals Time Taken: 2:52 PM, Height: 63 in, Weight: 198 lbs, BMI: 35.1, Temperature: 97.8 F, Pulse: 83 bpm, Respiratory Rate: 18 breaths/min, Blood Pressure: 190/92 mmHg. General Notes: stated she is very upset about her husband having a medical procedure and that the pain in her wound is hurting during cleaning; instructed to talk to PCP about B/P and monitor Psychiatric this patient is able to make decisions and demonstrates good insight into disease process. Alert and Oriented x 3. pleasant and cooperative. General Notes: Patient's wound bed showed signs of good granulation epithelization at this point. I do think the Hydrofera Blue has done well I do think we need to make sure to talk a little bit down into the wound and will cover with a second piece secure with roll gauze. Regular try Tubigrip instead of the compression wrap as she keeps cutting the wrap down anyway as getting up causing more of a problem than helping in any good way. Integumentary (Hair, Skin) Wound #1 status is Open. Original cause of wound was Skin Tear/Laceration. The date acquired was: 07/30/2021. The wound has been in treatment 3 weeks. The wound is located on the Left,Anterior Lower Leg. The wound measures 1.9cm length x 0.5cm width x 0.3cm depth; 0.746cm^2 area and 0.224cm^3 volume. There is Fat Layer (Subcutaneous Tissue) exposed.  There is no tunneling or undermining noted. There is a medium amount of serosanguineous drainage noted. The wound margin is flat and intact. There is small (1-33%) red granulation within the wound bed. There is a large (67-100%) amount of necrotic tissue within the wound bed including Adherent Slough. MARYBETH, DANDY ([id number]) Assessment Active Problems ICD-10 Venous insufficiency (chronic) (peripheral) Non-pressure chronic ulcer of other part of left lower leg with fat layer exposed Fibromyalgia Bilateral primary osteoarthritis of knee Tinea corporis Rash and other nonspecific skin eruption Plan Follow-up Appointments: Return Appointment in 1 week. Nurse Visit as needed - Twice a week Bathing/ Shower/ Hygiene: May shower with wound dressing protected with water repellent cover or cast protector. No tub bath. Edema Control - Lymphedema / Segmental Compressive Device / Other: Tubigrip single layer applied. Elevate, Exercise Daily and Avoid Standing for Long Periods of Time. Elevate legs to the level of the heart and pump ankles as often as possible Elevate leg(s) parallel to the floor when sitting. Medications-Please add to medication list.: Other: - finish prednisone as prescribed Try Allegra, Claritin or Zyrtec for allergic skin reactions WOUND #1: - Lower Leg Wound Laterality: Left, Anterior Cleanser: Soap and Water 2 x Per Week/30 Days Discharge Instructions: Gently cleanse wound with antibacterial soap, rinse and pat dry prior to dressing wounds Peri-Wound Care: Triamcinolone Acetonide Cream, 0.1%, 15 (g) tube 2 x Per Week/30 Days Discharge Instructions: Apply to legs Primary Dressing: Hydrofera Blue Ready Transfer Foam, 2.5x2.5 (in/in) 2 x Per Week/30 Days Discharge Instructions: Cut Hydrofera Blue Ready and apply to wound bed, secure with left over hydrofera blue Secondary Dressing:  ABD Pad 5x9 (in/in) 2 x Per Week/30 Days Discharge Instructions: Cover with ABD  pad Secured With: Kerlix Roll Sterile or Non-Sterile 6-ply 4.5x4 (yd/yd) 2 x Per Week/30 Days Discharge Instructions: Apply Kerlix to base of toes to 3-finger-widths below knee bend Compression Wrap: Tubi E 2 x Per Week/30 Days 1. Would recommend currently that we going continue with wound care measures as before with regard to the [facility] which I think is doing a great job. 2. I am also can recommend that we have the patient continue with compression therapy although we will get a be using Tubigrip and see if this will do better I know she is going to swell more but hopefully will be sufficient. We will see patient back for reevaluation in 1 week here in the clinic. If anything worsens or changes patient will contact our office for additional recommendations. Electronic Signature(s) Signed: 09/21/2021 3:34:11 PM By: Worthy Keeler PA-C Entered By: Worthy Keeler on 09/21/2021 15:34:10 Bonds, [name] ([id number]) -------------------------------------------------------------------------------- SuperBill Details Patient Name: Vinsant, Syvanna L. Date of Service: 09/21/2021 Medical Record Number: [id number] Patient Account Number: [id number] Date of Birth/Sex: 1941/03/01 (80 y.o. F) Treating RN: Donnamarie Poag Primary Care Provider: [name] Other Clinician: Referring Provider: [name] Treating Provider/Extender: Skipper Cliche in Treatment: 3 Diagnosis Coding ICD-10 Codes Code Description I87.2 Venous insufficiency (chronic) (peripheral) L97.822 Non-pressure chronic ulcer of other part of left lower leg with fat layer exposed M79.7 Fibromyalgia M17.0 Bilateral primary osteoarthritis of knee B35.4 Tinea corporis R21 Rash and other nonspecific skin eruption Facility Procedures CPT4 Code: 20601561 Description: 53794 - WOUND CARE VISIT-LEV 3 EST PT Modifier: Quantity: 1 Physician Procedures CPT4 Code: 3276147 Description: 09295 - WC PHYS LEVEL 4 - EST  PT Modifier: Quantity: 1 CPT4 Code: Description: ICD-10 Diagnosis Description I87.2 Venous insufficiency (chronic) (peripheral) L97.822 Non-pressure chronic ulcer of other part of left lower leg with fat lay M79.7 Fibromyalgia M17.0 Bilateral primary osteoarthritis of knee Modifier: er exposed Quantity: Electronic Signature(s) Signed: 09/21/2021 4:22:59 PM By: Donnamarie Poag Signed: 09/21/2021 4:43:43 PM By: Worthy Keeler PA-C Previous Signature: 09/21/2021 3:34:22 PM Version By: Worthy Keeler PA-C Entered By: Donnamarie Poag on 09/21/2021 15:40:55

## 2021-09-26 ENCOUNTER — Encounter: Payer: PPO | Attending: Physician Assistant

## 2021-09-26 ENCOUNTER — Other Ambulatory Visit: Payer: Self-pay

## 2021-09-26 DIAGNOSIS — I872 Venous insufficiency (chronic) (peripheral): Secondary | ICD-10-CM | POA: Diagnosis not present

## 2021-09-26 DIAGNOSIS — M797 Fibromyalgia: Secondary | ICD-10-CM | POA: Diagnosis not present

## 2021-09-26 DIAGNOSIS — M170 Bilateral primary osteoarthritis of knee: Secondary | ICD-10-CM | POA: Diagnosis not present

## 2021-09-26 DIAGNOSIS — L97822 Non-pressure chronic ulcer of other part of left lower leg with fat layer exposed: Secondary | ICD-10-CM | POA: Diagnosis not present

## 2021-09-26 DIAGNOSIS — B354 Tinea corporis: Secondary | ICD-10-CM | POA: Diagnosis not present

## 2021-09-26 NOTE — Progress Notes (Signed)
VASUDHA, MARINACCIO ([id number]) Visit Report for 09/26/2021 Arrival Information Details Patient Name: DEADRE, SCHIMMING. Date of Service: 09/26/2021 3:45 PM Medical Record Number: [id number] Patient Account Number: [id number] Date of Birth/Sex: [birth year] (80 y.o. F) Treating RN: Yevonne Pax Primary Care Meliya Mcconahy: [name] Givens Other Clinician: Referring Asya Derryberry: [name] Givens Treating Cledith Kamiya/Extender: Rowan Blase in Treatment: 3 Visit Information History Since Last Visit All ordered tests and consults were completed: No Patient Arrived: Dan Humphreys Added or deleted any medications: No Arrival Time: 16:24 Any new allergies or adverse reactions: No Accompanied By: self Had a fall or experienced change in No Transfer Assistance: None activities of daily living that may affect Patient Identification Verified: Yes risk of falls: Secondary Verification Process Completed: Yes Signs or symptoms of abuse/neglect since last visito No Patient Requires Transmission-Based Precautions: No Hospitalized since last visit: No Patient Has Alerts: No Implantable device outside of the clinic excluding No cellular tissue based products placed in the center since last visit: Has Dressing in Place as Prescribed: Yes Pain Present Now: No Electronic Signature(s) Signed: 09/26/2021 4:25:00 PM By: Yevonne Pax RN Entered By: Yevonne Pax on 09/26/2021 16:25:00 Cardosa, [name] ([id number]) -------------------------------------------------------------------------------- Clinic Level of Care Assessment Details Patient Name: Burggraf, Sudie L. Date of Service: 09/26/2021 3:45 PM Medical Record Number: [id number] Patient Account Number: [id number] Date of Birth/Sex: 1941-06-14 (80 y.o. F) Treating RN: Yevonne Pax Primary Care Moody Robben: [name] Givens Other Clinician: Referring Tenleigh Byer: [name] Givens Treating Dhruti Ghuman/Extender: Rowan Blase in Treatment: 3 Clinic Level of Care  Assessment Items TOOL 4 Quantity Score X - Use when only an EandM is performed on FOLLOW-UP visit 1 0 ASSESSMENTS - Nursing Assessment / Reassessment X - Reassessment of Co-morbidities (includes updates in patient status) 1 10 X- 1 5 Reassessment of Adherence to Treatment Plan ASSESSMENTS - Wound and Skin Assessment / Reassessment X - Simple Wound Assessment / Reassessment - one wound 1 5 []  - 0 Complex Wound Assessment / Reassessment - multiple wounds []  - 0 Dermatologic / Skin Assessment (not related to wound area) ASSESSMENTS - Focused Assessment []  - Circumferential Edema Measurements - multi extremities 0 []  - 0 Nutritional Assessment / Counseling / Intervention []  - 0 Lower Extremity Assessment (monofilament, tuning fork, pulses) []  - 0 Peripheral Arterial Disease Assessment (using hand held doppler) ASSESSMENTS - Ostomy and/or Continence Assessment and Care []  - Incontinence Assessment and Management 0 []  - 0 Ostomy Care Assessment and Management (repouching, etc.) PROCESS - Coordination of Care X - Simple Patient / Family Education for ongoing care 1 15 []  - 0 Complex (extensive) Patient / Family Education for ongoing care []  - 0 Staff obtains Chiropractor, Records, Test Results / Process Orders []  - 0 Staff telephones HHA, Nursing Homes / Clarify orders / etc []  - 0 Routine Transfer to another Facility (non-emergent condition) []  - 0 Routine Hospital Admission (non-emergent condition) []  - 0 New Admissions / Manufacturing engineer / Ordering NPWT, Apligraf, etc. []  - 0 Emergency Hospital Admission (emergent condition) X- 1 10 Simple Discharge Coordination []  - 0 Complex (extensive) Discharge Coordination PROCESS - Special Needs []  - Pediatric / Minor Patient Management 0 []  - 0 Isolation Patient Management []  - 0 Hearing / Language / Visual special needs []  - 0 Assessment of Community assistance (transportation, D/C planning, etc.) []  - 0 Additional  assistance / Altered mentation []  - 0 Support Surface(s) Assessment (bed, cushion, seat, etc.) INTERVENTIONS - Wound Cleansing / Measurement Seiple, Rose-Marie L. ([id number]) X- 1 5 Simple Wound Cleansing -  one wound []  - 0 Complex Wound Cleansing - multiple wounds X- 1 5 Wound Imaging (photographs - any number of wounds) []  - 0 Wound Tracing (instead of photographs) X- 1 5 Simple Wound Measurement - one wound []  - 0 Complex Wound Measurement - multiple wounds INTERVENTIONS - Wound Dressings X - Small Wound Dressing one or multiple wounds 1 10 []  - 0 Medium Wound Dressing one or multiple wounds []  - 0 Large Wound Dressing one or multiple wounds []  - 0 Application of Medications - topical []  - 0 Application of Medications - injection INTERVENTIONS - Miscellaneous []  - External ear exam 0 []  - 0 Specimen Collection (cultures, biopsies, blood, body fluids, etc.) []  - 0 Specimen(s) / Culture(s) sent or taken to Lab for analysis []  - 0 Patient Transfer (multiple staff / Nurse, adult / Similar devices) []  - 0 Simple Staple / Suture removal (25 or less) []  - 0 Complex Staple / Suture removal (26 or more) []  - 0 Hypo / Hyperglycemic Management (close monitor of Blood Glucose) []  - 0 Ankle / Brachial Index (ABI) - do not check if billed separately X- 1 5 Vital Signs Has the patient been seen at the hospital within the last three years: Yes Total Score: 75 Level Of Care: New/Established - Level 2 Electronic Signature(s) Unsigned Entered ByYevonne Pax on 09/26/2021 16:28:42 Signature(s): Date(s): Begue, [name] ([id number]) -------------------------------------------------------------------------------- Encounter Discharge Information Details Patient Name: Umbarger, Wilburta L. Date of Service: 09/26/2021 3:45 PM Medical Record Number: [id number] Patient Account Number: [id number] Date of Birth/Sex: 15-Jun-1941 (80 y.o. F) Treating RN: Yevonne Pax Primary Care  Toshika Parrow: [name] Givens Other Clinician: Referring Zykeem Bauserman: [name] Givens Treating Ernest Orr/Extender: Rowan Blase in Treatment: 3 Encounter Discharge Information Items Discharge Condition: Stable Ambulatory Status: Walker Discharge Destination: Home Transportation: Private Auto Accompanied By: self Schedule Follow-up Appointment: Yes Clinical Summary of Care: Patient Declined Electronic Signature(s) Signed: 09/26/2021 4:27:44 PM By: Yevonne Pax RN Entered By: Yevonne Pax on 09/26/2021 16:27:44 Winkleman, [name] ([id number]) -------------------------------------------------------------------------------- Wound Assessment Details Patient Name: Husband, Mishti L. Date of Service: 09/26/2021 3:45 PM Medical Record Number: [id number] Patient Account Number: [id number] Date of Birth/Sex: 1941/07/17 (80 y.o. F) Treating RN: Yevonne Pax Primary Care Emara Lichter: [name] Givens Other Clinician: Referring Omar Orrego: [name] Givens Treating Tessica Cupo/Extender: Allen Derry Weeks in Treatment: 3 Wound Status Wound Number: 1 Primary Etiology: Venous Leg Ulcer Wound Location: Left, Anterior Lower Leg Wound Status: Open Wounding Event: Skin Tear/Laceration Comorbid History: Cataracts, Osteoarthritis, Received Radiation Date Acquired: 07/30/2021 Weeks Of Treatment: 3 Clustered Wound: No Wound Measurements Length: (cm) 1.9 Width: (cm) 0.5 Depth: (cm) 0.3 Area: (cm) 0.746 Volume: (cm) 0.224 % Reduction in Area: 40.7% % Reduction in Volume: 64.3% Epithelialization: Small (1-33%) Tunneling: No Undermining: No Wound Description Classification: Full Thickness Without Exposed Support Structu Wound Margin: Flat and Intact Exudate Amount: Medium Exudate Type: Serosanguineous Exudate Color: red, brown res Foul Odor After Cleansing: No Slough/Fibrino Yes Wound Bed Granulation Amount: Small (1-33%) Exposed Structure Granulation Quality: Red Fascia Exposed: No Necrotic Amount:  Large (67-100%) Fat Layer (Subcutaneous Tissue) Exposed: Yes Necrotic Quality: Adherent Slough Tendon Exposed: No Muscle Exposed: No Joint Exposed: No Bone Exposed: No Treatment Notes Wound #1 (Lower Leg) Wound Laterality: Left, Anterior Cleanser Soap and Water Discharge Instruction: Gently cleanse wound with antibacterial soap, rinse and pat dry prior to dressing wounds Peri-Wound Care Triamcinolone Acetonide Cream, 0.1%, 15 (g) tube Discharge Instruction: Apply to legs Topical Primary Dressing Hydrofera Blue Ready Transfer Foam, 2.5x2.5 (in/in) Discharge Instruction: Cut  Hydrofera Blue Ready and apply to wound bed, secure with left over hydrofera blue Secondary Dressing ABD Pad 5x9 (in/in) Discharge Instruction: Cover with ABD pad Secured With Kerlix Roll Sterile or Non-Sterile 6-ply 4.5x4 (yd/yd) Kollmann, Robby L. ([id number]) Discharge Instruction: Apply Kerlix to base of toes to 3-finger-widths below knee bend Compression Wrap Tubi E Compression Stockings Add-Ons Electronic Signature(s) Signed: 09/26/2021 4:25:35 PM By: Yevonne Pax RN Entered By: Yevonne Pax on 09/26/2021 16:25:35

## 2021-09-28 ENCOUNTER — Encounter: Payer: PPO | Admitting: Physician Assistant

## 2021-09-28 ENCOUNTER — Other Ambulatory Visit: Payer: Self-pay

## 2021-09-28 DIAGNOSIS — L97822 Non-pressure chronic ulcer of other part of left lower leg with fat layer exposed: Secondary | ICD-10-CM | POA: Diagnosis not present

## 2021-09-28 DIAGNOSIS — I872 Venous insufficiency (chronic) (peripheral): Secondary | ICD-10-CM | POA: Diagnosis not present

## 2021-09-28 NOTE — Progress Notes (Addendum)
[name], [name] ([id number]) Visit Report for 09/28/2021 Chief Complaint Document Details Patient Name: [name], [name] Date of Service: 09/28/2021 3:45 PM Medical Record Number: [id number] Patient Account Number: [id number] Date of Birth/Sex: [birth year] (80 y.o. F) Treating RN: Carlene Coria Primary Care Provider: [name] Other Clinician: Referring Provider: [name] Treating Provider/Extender: Skipper Cliche in Treatment: 4 Information Obtained from: Patient Chief Complaint Left LE Ulcer Electronic Signature(s) Signed: 09/28/2021 3:54:51 PM By: Worthy Keeler PA-C Entered By: Worthy Keeler on 09/28/2021 15:54:51 [name], [name] ([id number]) -------------------------------------------------------------------------------- Debridement Details Patient Name: [name], [name] Date of Service: 09/28/2021 3:45 PM Medical Record Number: [id number] Patient Account Number: [id number] Date of Birth/Sex: 04/06/1941 (80 y.o. F) Treating RN: Carlene Coria Primary Care Provider: [name] Other Clinician: Referring Provider: [name] Treating Provider/Extender: Skipper Cliche in Treatment: 4 Debridement Performed for Wound #1 Left,Anterior Lower Leg Assessment: Performed By: Physician Tommie Sams., PA-C Debridement Type: Debridement Severity of Tissue Pre Debridement: Fat layer exposed Level of Consciousness (Pre- Awake and Alert procedure): Pre-procedure Verification/Time Out Yes - 14:30 Taken: Start Time: 14:30 Pain Control: Lidocaine 4% Topical Solution Total Area Debrided (L x W): 2 (cm) x 0.7 (cm) = 1.4 (cm) Tissue and other material Viable, Non-Viable, Slough, Subcutaneous, Skin: Dermis , Skin: Epidermis, Slough debrided: Level: Skin/Subcutaneous Tissue Debridement Description: Excisional Instrument: Curette Bleeding: Moderate Hemostasis Achieved: Pressure End Time: 16:30 Procedural Pain: 3 Post Procedural Pain: 0 Response to  Treatment: Procedure was tolerated well Level of Consciousness (Post- Awake and Alert procedure): Post Debridement Measurements of Total Wound Length: (cm) 2 Width: (cm) 0.7 Depth: (cm) 0.3 Volume: (cm) 0.33 Character of Wound/Ulcer Post Debridement: Improved Severity of Tissue Post Debridement: Fat layer exposed Post Procedure Diagnosis Same as Pre-procedure Electronic Signature(s) Signed: 09/28/2021 5:06:33 PM By: Worthy Keeler PA-C Signed: 09/28/2021 5:07:02 PM By: Carlene Coria RN Entered By: Carlene Coria on 09/28/2021 16:31:22 [name], [name] ([id number]) -------------------------------------------------------------------------------- HPI Details Patient Name: [name], [name] Date of Service: 09/28/2021 3:45 PM Medical Record Number: [id number] Patient Account Number: [id number] Date of Birth/Sex: [birth year] (80 y.o. F) Treating RN: Carlene Coria Primary Care Provider: [name] Other Clinician: Referring Provider: [name] Treating Provider/Extender: Skipper Cliche in Treatment: 4 History of Present Illness HPI Description: 08/31/2021 upon evaluation today patient presents for initial inspection here in our clinic concerning issues that she is having with a wound on her left anterior lower extremity. This is actually an area that she unfortunately tells me she was picking up a cage and dropped hitting the anterior portion of her shin causing a skin tear. She did not go to the hospital due to the fact that she did not realize that she would need to. This happened on July 30, 2021. With that being said the patient has been on Augmentin she has about 1 and half days of this left. Nonetheless she tells me that she does have a longstanding history of leg swelling. She does have chronic venous insufficiency as well it appears to me. She also has some evidence of may be early stages of lymphedema. She has a history of fibromyalgia, bilateral knee osteoarthritis,  and appears to have a fungal infection of the right ankle region. In regard to the arthritis she tells me her legs tend to swell less after she gets her knee injections allowing her to be able to walk and get around much more effectively. Following the injections she does well for a while and then when that  seems to be wearing off she tends to start having issues with more swelling because she is more mobile that makes sense from a venous standpoint. 10/10; I was asked to see this patient today who came in for a nurse visit. Firstly she has developed a widespread very pruritic rash involving her abdomen and back. Lesser extent on her feet. The second problem is that she is complaining about the tightness of the wound and wrap on the left leg where the wound is. 09/14/2021 upon evaluation today patient appears to be doing decently well in regard to her leg ulcer. The biggest issue she is having is a significant issue with a rash currently. I do not see any signs of active infection systemically which is great news although with regard to the rash she did see dermatology they told her she had eczema that does not seem to sit right with me out think that is mainly the issues she has going on. I do think she may benefit from the use of a short course of prednisone which should help with the rash which is absolutely driving her crazy. 09/21/2021 upon evaluation today patient appears to be doing decently well in regard to her wound this is still pretty deep. The prednisone has helped with the rash although is not completely done and it is doing much better than what it was. Fortunately there is no signs of active infection systemically at this time also great news. 09/28/2021 upon evaluation today patient's wound on her leg actually showing some signs of improvement which is good news I am happy in that regard. Fortunately there does not appear to be any evidence of active infection systemically which is  great news. Unfortunately she is still continuing to have issues with her rash which is almost worse than the actual wound itself for her at this point. Electronic Signature(s) Signed: 09/28/2021 4:51:03 PM By: Worthy Keeler PA-C Entered By: Worthy Keeler on 09/28/2021 16:51:02 [name], [name] ([id number]) -------------------------------------------------------------------------------- Physical Exam Details Patient Name: [name], [name] Date of Service: 09/28/2021 3:45 PM Medical Record Number: [id number] Patient Account Number: [id number] Date of Birth/Sex: 10-28-1941 (80 y.o. F) Treating RN: Carlene Coria Primary Care Provider: [name] Other Clinician: Referring Provider: [name] Treating Provider/Extender: Skipper Cliche in Treatment: 4 Constitutional patient is hypertensive.. Obese and well-hydrated in no acute distress. Respiratory normal breathing without difficulty. Psychiatric this patient is able to make decisions and demonstrates good insight into disease process. Alert and Oriented x 3. pleasant and cooperative. Notes Upon inspection patient's wound bed actually showed signs of good granulation epithelization at this point. Fortunately there does not appear to be any evidence of active infection systemically which is great news and overall I am pleased in that regard. Her blood pressure still continues to be quite high. I am more concerned about this going even higher if I were to treat her with something even like hydralazine as that can also raise blood pressure. I do still think that sounds like a great idea. This rash is actually something both her and her husband are experiencing which does have female a bit more concerned as well. Electronic Signature(s) Signed: 09/28/2021 4:51:50 PM By: Worthy Keeler PA-C Entered By: Worthy Keeler on 09/28/2021 16:51:49 [name], [name]  ([id number]) -------------------------------------------------------------------------------- Physician Orders Details Patient Name: [name], [name] Date of Service: 09/28/2021 3:45 PM Medical Record Number: [id number] Patient Account Number: [id number] Date of Birth/Sex: 1941-05-20 (80 y.o. F) Treating RN: Epps,  Spreckels Primary Care Provider: [name] Other Clinician: Referring Provider: [name] Treating Provider/Extender: Skipper Cliche in Treatment: 4 Verbal / Phone Orders: No Diagnosis Coding ICD-10 Coding Code Description I87.2 Venous insufficiency (chronic) (peripheral) L97.822 Non-pressure chronic ulcer of other part of left lower leg with fat layer exposed M79.7 Fibromyalgia M17.0 Bilateral primary osteoarthritis of knee B35.4 Tinea corporis R21 Rash and other nonspecific skin eruption Follow-up Appointments o Return Appointment in 1 week. o Nurse Visit as needed - Twice a week Bathing/ Shower/ Hygiene o May shower with wound dressing protected with water repellent cover or cast protector. o No tub bath. Edema Control - Lymphedema / Segmental Compressive Device / Other o Tubigrip single layer applied. - E o Elevate, Exercise Daily and Avoid Standing for Long Periods of Time. o Elevate legs to the level of the heart and pump ankles as often as possible o Elevate leg(s) parallel to the floor when sitting. Medications-Please add to medication list. o Other: - finish prednisone as prescribed Try Allegra, Claritin or Zyrtec for allergic skin reactions Wound Treatment Wound #1 - Lower Leg Wound Laterality: Left, Anterior Cleanser: Soap and Water 2 x Per Week/30 Days Discharge Instructions: Gently cleanse wound with antibacterial soap, rinse and pat dry prior to dressing wounds Peri-Wound Care: Triamcinolone Acetonide Cream, 0.1%, 15 (g) tube 2 x Per Week/30 Days Discharge Instructions: Apply to legs Primary Dressing: Hydrofera Blue Ready  Transfer Foam, 2.5x2.5 (in/in) 2 x Per Week/30 Days Discharge Instructions: Cut Hydrofera Blue Ready and apply to wound bed, secure with left over hydrofera blue Secondary Dressing: Zetuvit Plus Silicone Border Dressing 4x4 (in/in) 2 x Per Week/30 Days Compression Wrap: Tubi E 2 x Per Week/30 Days Electronic Signature(s) Signed: 09/28/2021 5:06:33 PM By: Worthy Keeler PA-C Signed: 09/28/2021 5:07:02 PM By: Carlene Coria RN Entered By: Carlene Coria on 09/28/2021 16:54:25 [name], [name] ([id number]) -------------------------------------------------------------------------------- Problem List Details Patient Name: [name], [name] Date of Service: 09/28/2021 3:45 PM Medical Record Number: [id number] Patient Account Number: [id number] Date of Birth/Sex: Jun 12, 1941 (80 y.o. F) Treating RN: Carlene Coria Primary Care Provider: [name] Other Clinician: Referring Provider: [name] Treating Provider/Extender: Skipper Cliche in Treatment: 4 Active Problems ICD-10 Encounter Code Description Active Date MDM Diagnosis I87.2 Venous insufficiency (chronic) (peripheral) 08/31/2021 No Yes L97.822 Non-pressure chronic ulcer of other part of left lower leg with fat layer 08/31/2021 No Yes exposed M79.7 Fibromyalgia 08/31/2021 No Yes M17.0 Bilateral primary osteoarthritis of knee 08/31/2021 No Yes B35.4 Tinea corporis 08/31/2021 No Yes R21 Rash and other nonspecific skin eruption 09/04/2021 No Yes Inactive Problems Resolved Problems Electronic Signature(s) Signed: 09/28/2021 3:54:41 PM By: Worthy Keeler PA-C Entered By: Worthy Keeler on 09/28/2021 15:54:41 [name], [name] ([id number]) -------------------------------------------------------------------------------- Progress Note Details Patient Name: [name], [name] Date of Service: 09/28/2021 3:45 PM Medical Record Number: [id number] Patient Account Number: [id number] Date of Birth/Sex: September 28, 1941 (80 y.o. F) Treating  RN: Carlene Coria Primary Care Provider: [name] Other Clinician: Referring Provider: [name] Treating Provider/Extender: Skipper Cliche in Treatment: 4 Subjective Chief Complaint Information obtained from Patient Left LE Ulcer History of Present Illness (HPI) 08/31/2021 upon evaluation today patient presents for initial inspection here in our clinic concerning issues that she is having with a wound on her left anterior lower extremity. This is actually an area that she unfortunately tells me she was picking up a cage and dropped hitting the anterior portion of her shin causing a skin tear. She did not go to the  hospital due to the fact that she did not realize that she would need to. This happened on July 30, 2021. With that being said the patient has been on Augmentin she has about 1 and half days of this left. Nonetheless she tells me that she does have a longstanding history of leg swelling. She does have chronic venous insufficiency as well it appears to me. She also has some evidence of may be early stages of lymphedema. She has a history of fibromyalgia, bilateral knee osteoarthritis, and appears to have a fungal infection of the right ankle region. In regard to the arthritis she tells me her legs tend to swell less after she gets her knee injections allowing her to be able to walk and get around much more effectively. Following the injections she does well for a while and then when that seems to be wearing off she tends to start having issues with more swelling because she is more mobile that makes sense from a venous standpoint. 10/10; I was asked to see this patient today who came in for a nurse visit. Firstly she has developed a widespread very pruritic rash involving her abdomen and back. Lesser extent on her feet. The second problem is that she is complaining about the tightness of the wound and wrap on the left leg where the wound is. 09/14/2021 upon evaluation  today patient appears to be doing decently well in regard to her leg ulcer. The biggest issue she is having is a significant issue with a rash currently. I do not see any signs of active infection systemically which is great news although with regard to the rash she did see dermatology they told her she had eczema that does not seem to sit right with me out think that is mainly the issues she has going on. I do think she may benefit from the use of a short course of prednisone which should help with the rash which is absolutely driving her crazy. 09/21/2021 upon evaluation today patient appears to be doing decently well in regard to her wound this is still pretty deep. The prednisone has helped with the rash although is not completely done and it is doing much better than what it was. Fortunately there is no signs of active infection systemically at this time also great news. 09/28/2021 upon evaluation today patient's wound on her leg actually showing some signs of improvement which is good news I am happy in that regard. Fortunately there does not appear to be any evidence of active infection systemically which is great news. Unfortunately she is still continuing to have issues with her rash which is almost worse than the actual wound itself for her at this point. Objective Constitutional patient is hypertensive.. Obese and well-hydrated in no acute distress. Vitals Time Taken: 4:04 PM, Height: 63 in, Weight: 198 lbs, BMI: 35.1, Temperature: 98.2 F, Pulse: 94 bpm, Respiratory Rate: 18 breaths/min, Blood Pressure: 186/77 mmHg. Respiratory normal breathing without difficulty. Psychiatric this patient is able to make decisions and demonstrates good insight into disease process. Alert and Oriented x 3. pleasant and cooperative. General Notes: Upon inspection patient's wound bed actually showed signs of good granulation epithelization at this point. Fortunately there does not appear to be any  evidence of active infection systemically which is great news and overall I am pleased in that regard. Her blood pressure still continues to be quite high. I am more concerned about this going even higher if I were to treat her with  something even like hydralazine as that can also raise blood pressure. I do still think that sounds like a great idea. This rash is actually something both her and her husband are experiencing which does have female a bit more concerned as well. Integumentary (Hair, Skin) Wound #1 status is Open. Original cause of wound was Skin Tear/Laceration. The date acquired was: 07/30/2021. The wound has been in treatment [name], [name] ([id number]) 4 weeks. The wound is located on the Left,Anterior Lower Leg. The wound measures 2cm length x 7cm width x 0.3cm depth; 10.996cm^2 area and 3.299cm^3 volume. There is Fat Layer (Subcutaneous Tissue) exposed. There is no tunneling or undermining noted. There is a medium amount of serosanguineous drainage noted. The wound margin is flat and intact. There is small (1-33%) red granulation within the wound bed. There is a large (67-100%) amount of necrotic tissue within the wound bed including Adherent Slough. Assessment Active Problems ICD-10 Venous insufficiency (chronic) (peripheral) Non-pressure chronic ulcer of other part of left lower leg with fat layer exposed Fibromyalgia Bilateral primary osteoarthritis of knee Tinea corporis Rash and other nonspecific skin eruption Procedures Wound #1 Pre-procedure diagnosis of Wound #1 is a Venous Leg Ulcer located on the Left,Anterior Lower Leg .Severity of Tissue Pre Debridement is: Fat layer exposed. There was a Excisional Skin/Subcutaneous Tissue Debridement with a total area of 1.4 sq cm performed by Tommie Sams., PA-C. With the following instrument(s): Curette to remove Viable and Non-Viable tissue/material. Material removed includes Subcutaneous Tissue, Slough, Skin: Dermis, and  Skin: Epidermis after achieving pain control using Lidocaine 4% Topical Solution. No specimens were taken. A time out was conducted at 14:30, prior to the start of the procedure. A Moderate amount of bleeding was controlled with Pressure. The procedure was tolerated well with a pain level of 3 throughout and a pain level of 0 following the procedure. Post Debridement Measurements: 2cm length x 0.7cm width x 0.3cm depth; 0.33cm^3 volume. Character of Wound/Ulcer Post Debridement is improved. Severity of Tissue Post Debridement is: Fat layer exposed. Post procedure Diagnosis Wound #1: Same as Pre-Procedure Plan Follow-up Appointments: Return Appointment in 1 week. Nurse Visit as needed - Twice a week Bathing/ Shower/ Hygiene: May shower with wound dressing protected with water repellent cover or cast protector. No tub bath. Edema Control - Lymphedema / Segmental Compressive Device / Other: Tubigrip single layer applied. - E Elevate, Exercise Daily and Avoid Standing for Long Periods of Time. Elevate legs to the level of the heart and pump ankles as often as possible Elevate leg(s) parallel to the floor when sitting. Medications-Please add to medication list.: Other: - finish prednisone as prescribed Try Allegra, Claritin or Zyrtec for allergic skin reactions WOUND #1: - Lower Leg Wound Laterality: Left, Anterior Cleanser: Soap and Water 2 x Per Week/30 Days Discharge Instructions: Gently cleanse wound with antibacterial soap, rinse and pat dry prior to dressing wounds Peri-Wound Care: Triamcinolone Acetonide Cream, 0.1%, 15 (g) tube 2 x Per Week/30 Days Discharge Instructions: Apply to legs Primary Dressing: Hydrofera Blue Ready Transfer Foam, 2.5x2.5 (in/in) 2 x Per Week/30 Days Discharge Instructions: Cut Hydrofera Blue Ready and apply to wound bed, secure with left over hydrofera blue Secondary Dressing: Zetuvit Plus Silicone Border Dressing 4x4 (in/in) 2 x Per Week/30 Days Compression  Wrap: Tubi E 2 x Per Week/30 Days 1. Would recommend at this point that we continue with the [facility] I think this is doing a good job. 2. I am also can recommend that we  have the patient continue to monitor for any signs of worsening or infection obviously right now this seems to be doing quite well. 3. I am also can recommend that we continue with the Tubigrip after applying the border foam dressing which I think has done well currently. [name], Adalin L. ([id number]) We will see patient back for reevaluation in 1 week here in the clinic. If anything worsens or changes patient will contact our office for additional recommendations. Electronic Signature(s) Signed: 09/28/2021 4:54:51 PM By: Worthy Keeler PA-C Previous Signature: 09/28/2021 4:52:30 PM Version By: Worthy Keeler PA-C Entered By: Worthy Keeler on 09/28/2021 16:54:51 [name], [name] ([id number]) -------------------------------------------------------------------------------- SuperBill Details Patient Name: Cayer, Manette L. Date of Service: 09/28/2021 Medical Record Number: [id number] Patient Account Number: [id number] Date of Birth/Sex: [birth year] (80 y.o. F) Treating RN: Carlene Coria Primary Care Provider: [name] Other Clinician: Referring Provider: [name] Treating Provider/Extender: Skipper Cliche in Treatment: 4 Diagnosis Coding ICD-10 Codes Code Description I87.2 Venous insufficiency (chronic) (peripheral) L97.822 Non-pressure chronic ulcer of other part of left lower leg with fat layer exposed M79.7 Fibromyalgia M17.0 Bilateral primary osteoarthritis of knee B35.4 Tinea corporis R21 Rash and other nonspecific skin eruption Facility Procedures CPT4 Code: 62952841 Description: 32440 - DEB SUBQ TISSUE 20 SQ CM/< Modifier: Quantity: 1 CPT4 Code: Description: ICD-10 Diagnosis Description L97.822 Non-pressure chronic ulcer of other part of left lower leg with fat layer Modifier:  exposed Quantity: Physician Procedures CPT4 Code: [id number] Description: 66440 - WC PHYS SUBQ TISS 20 SQ CM Modifier: Quantity: 1 CPT4 Code: Description: ICD-10 Diagnosis Description L97.822 Non-pressure chronic ulcer of other part of left lower leg with fat layer Modifier: exposed Quantity: Electronic Signature(s) Signed: 09/28/2021 4:55:01 PM By: Worthy Keeler PA-C Entered By: Worthy Keeler on 09/28/2021 16:55:01

## 2021-10-02 ENCOUNTER — Ambulatory Visit: Payer: PPO

## 2021-10-05 ENCOUNTER — Other Ambulatory Visit: Payer: Self-pay

## 2021-10-05 ENCOUNTER — Encounter: Payer: PPO | Admitting: Physician Assistant

## 2021-10-05 DIAGNOSIS — I872 Venous insufficiency (chronic) (peripheral): Secondary | ICD-10-CM | POA: Diagnosis not present

## 2021-10-05 DIAGNOSIS — L97822 Non-pressure chronic ulcer of other part of left lower leg with fat layer exposed: Secondary | ICD-10-CM | POA: Diagnosis not present

## 2021-10-05 DIAGNOSIS — M170 Bilateral primary osteoarthritis of knee: Secondary | ICD-10-CM | POA: Diagnosis not present

## 2021-10-05 DIAGNOSIS — M797 Fibromyalgia: Secondary | ICD-10-CM | POA: Diagnosis not present

## 2021-10-05 NOTE — Progress Notes (Addendum)
HALY, FEHER ([id number]) Visit Report for 10/05/2021 Chief Complaint Document Details Patient Name: [name], [name] Date of Service: 10/05/2021 2:30 PM Medical Record Number: [id number] Patient Account Number: [id number] Date of Birth/Sex: 03-09-1941 (80 y.o. F) Treating RN: Donnamarie Poag Primary Care Provider: [name] Other Clinician: Referring Provider: [name] Treating Provider/Extender: Skipper Cliche in Treatment: 5 Information Obtained from: Patient Chief Complaint Left LE Ulcer Electronic Signature(s) Signed: 10/05/2021 3:32:34 PM By: Worthy Keeler PA-C Entered By: Worthy Keeler on 10/05/2021 15:32:34 [name], [name] ([id number]) -------------------------------------------------------------------------------- HPI Details Patient Name: [name], [name] Date of Service: 10/05/2021 2:30 PM Medical Record Number: [id number] Patient Account Number: [id number] Date of Birth/Sex: 30-Dec-1940 (80 y.o. F) Treating RN: Donnamarie Poag Primary Care Provider: [name] Other Clinician: Referring Provider: [name] Treating Provider/Extender: Skipper Cliche in Treatment: 5 History of Present Illness HPI Description: 08/31/2021 upon evaluation today patient presents for initial inspection here in our clinic concerning issues that she is having with a wound on her left anterior lower extremity. This is actually an area that she unfortunately tells me she was picking up a cage and dropped hitting the anterior portion of her shin causing a skin tear. She did not go to the hospital due to the fact that she did not realize that she would need to. This happened on July 30, 2021. With that being said the patient has been on Augmentin she has about 1 and half days of this left. Nonetheless she tells me that she does have a longstanding history of leg swelling. She does have chronic venous insufficiency as well it appears to me. She also has some  evidence of may be early stages of lymphedema. She has a history of fibromyalgia, bilateral knee osteoarthritis, and appears to have a fungal infection of the right ankle region. In regard to the arthritis she tells me her legs tend to swell less after she gets her knee injections allowing her to be able to walk and get around much more effectively. Following the injections she does well for a while and then when that seems to be wearing off she tends to start having issues with more swelling because she is more mobile that makes sense from a venous standpoint. 10/10; I was asked to see this patient today who came in for a nurse visit. Firstly she has developed a widespread very pruritic rash involving her abdomen and back. Lesser extent on her feet. The second problem is that she is complaining about the tightness of the wound and wrap on the left leg where the wound is. 09/14/2021 upon evaluation today patient appears to be doing decently well in regard to her leg ulcer. The biggest issue she is having is a significant issue with a rash currently. I do not see any signs of active infection systemically which is great news although with regard to the rash she did see dermatology they told her she had eczema that does not seem to sit right with me out think that is mainly the issues she has going on. I do think she may benefit from the use of a short course of prednisone which should help with the rash which is absolutely driving her crazy. 09/21/2021 upon evaluation today patient appears to be doing decently well in regard to her wound this is still pretty deep. The prednisone has helped with the rash although is not completely done and it is doing much better than what it was. Fortunately there is  no signs of active infection systemically at this time also great news. 09/28/2021 upon evaluation today patient's wound on her leg actually showing some signs of improvement which is good news I am happy  in that regard. Fortunately there does not appear to be any evidence of active infection systemically which is great news. Unfortunately she is still continuing to have issues with her rash which is almost worse than the actual wound itself for her at this point. 10/05/2021 upon evaluation today patient appears to be doing well with regard to her leg ulcer. She is tolerating the dressing changes without complication and overall I am extremely pleased with where we stand. With that being said I do think that she could possibly benefit from a snap VAC if we can gain approval I am not certain if this will be improved or not. Nonetheless I definitely think we can give this a try and if we get approval I think this will help speed up the healing process to be honest. In the interim I do believe the [facility] is doing decently well. Electronic Signature(s) Signed: 10/05/2021 4:53:36 PM By: Worthy Keeler PA-C Entered By: Worthy Keeler on 10/05/2021 16:53:35 [place], [name] ([id number]) -------------------------------------------------------------------------------- Physical Exam Details Patient Name: [name], [name] Date of Service: 10/05/2021 2:30 PM Medical Record Number: [id number] Patient Account Number: [id number] Date of Birth/Sex: 06/16/1941 (80 y.o. F) Treating RN: Donnamarie Poag Primary Care Provider: [name] Other Clinician: Referring Provider: [name] Treating Provider/Extender: Jeri Cos Weeks in Treatment: 5 Constitutional Well-nourished and well-hydrated in no acute distress. Respiratory normal breathing without difficulty. Psychiatric this patient is able to make decisions and demonstrates good insight into disease process. Alert and Oriented x 3. pleasant and cooperative. Notes Upon inspection patient's wound bed actually showed signs of good granulation epithelization at this point. I do not see any evidence of infection currently and overall I am  extremely pleased with where we stand. Electronic Signature(s) Signed: 10/05/2021 4:53:50 PM By: Worthy Keeler PA-C Entered By: Worthy Keeler on 10/05/2021 16:53:50 [name], [name] ([id number]) -------------------------------------------------------------------------------- Physician Orders Details Patient Name: [name], [name] Date of Service: 10/05/2021 2:30 PM Medical Record Number: [id number] Patient Account Number: [id number] Date of Birth/Sex: 1941/10/09 (80 y.o. F) Treating RN: Donnamarie Poag Primary Care Provider: [name] Other Clinician: Referring Provider: [name] Treating Provider/Extender: Skipper Cliche in Treatment: 5 Verbal / Phone Orders: No Diagnosis Coding ICD-10 Coding Code Description I87.2 Venous insufficiency (chronic) (peripheral) L97.822 Non-pressure chronic ulcer of other part of left lower leg with fat layer exposed M79.7 Fibromyalgia M17.0 Bilateral primary osteoarthritis of knee B35.4 Tinea corporis R21 Rash and other nonspecific skin eruption Follow-up Appointments o Return Appointment in 1 week. - once a week provider o Nurse Visit as needed - Twice a week dressing changes-nurse visit once per week Bathing/ Shower/ Hygiene o May shower with wound dressing protected with water repellent cover or cast protector. o No tub bath. Edema Control - Lymphedema / Segmental Compressive Device / Other o Tubigrip single layer applied. - E o Elevate, Exercise Daily and Avoid Standing for Long Periods of Time. o Elevate legs to the level of the heart and pump ankles as often as possible o Elevate leg(s) parallel to the floor when sitting. Medications-Please add to medication list. o Other: - finish prednisone as prescribed Try Allegra, Claritin or Zyrtec for allergic skin reactions Wound Treatment Wound #1 - Lower Leg Wound Laterality: Left, Anterior Cleanser: Soap and Water 2  x Per Week/30 Days Discharge  Instructions: Gently cleanse wound with antibacterial soap, rinse and pat dry prior to dressing wounds Primary Dressing: Hydrofera Blue Ready Transfer Foam, 2.5x2.5 (in/in) 2 x Per Week/30 Days Discharge Instructions: Cut Hydrofera Blue Ready and apply to wound bed, secure with left over hydrofera blue Secondary Dressing: Zetuvit Plus Silicone Border Dressing 4x4 (in/in) 2 x Per Week/30 Days Compression Wrap: Tubi E 2 x Per Week/30 Days Electronic Signature(s) Signed: 10/05/2021 4:07:12 PM By: Donnamarie Poag Signed: 10/05/2021 5:14:46 PM By: Worthy Keeler PA-C Entered By: Donnamarie Poag on 10/05/2021 15:42:51 [place], [name] ([id number]) -------------------------------------------------------------------------------- Problem List Details Patient Name: [name], [name] Date of Service: 10/05/2021 2:30 PM Medical Record Number: [id number] Patient Account Number: [id number] Date of Birth/Sex: 03-05-1941 (80 y.o. F) Treating RN: Donnamarie Poag Primary Care Provider: [name] Other Clinician: Referring Provider: [name] Treating Provider/Extender: Skipper Cliche in Treatment: 5 Active Problems ICD-10 Encounter Code Description Active Date MDM Diagnosis I87.2 Venous insufficiency (chronic) (peripheral) 08/31/2021 No Yes L97.822 Non-pressure chronic ulcer of other part of left lower leg with fat layer 08/31/2021 No Yes exposed M79.7 Fibromyalgia 08/31/2021 No Yes M17.0 Bilateral primary osteoarthritis of knee 08/31/2021 No Yes B35.4 Tinea corporis 08/31/2021 No Yes R21 Rash and other nonspecific skin eruption 09/04/2021 No Yes Inactive Problems Resolved Problems Electronic Signature(s) Signed: 10/05/2021 3:32:29 PM By: Worthy Keeler PA-C Entered By: Worthy Keeler on 10/05/2021 15:32:28 [name], [name] ([id number]) -------------------------------------------------------------------------------- Progress Note Details Patient Name: [name], [name] Date of Service:  10/05/2021 2:30 PM Medical Record Number: [id number] Patient Account Number: [id number] Date of Birth/Sex: [birth year] (80 y.o. F) Treating RN: Donnamarie Poag Primary Care Provider: [name] Other Clinician: Referring Provider: [name] Treating Provider/Extender: Skipper Cliche in Treatment: 5 Subjective Chief Complaint Information obtained from Patient Left LE Ulcer History of Present Illness (HPI) 08/31/2021 upon evaluation today patient presents for initial inspection here in our clinic concerning issues that she is having with a wound on her left anterior lower extremity. This is actually an area that she unfortunately tells me she was picking up a cage and dropped hitting the anterior portion of her shin causing a skin tear. She did not go to the hospital due to the fact that she did not realize that she would need to. This happened on July 30, 2021. With that being said the patient has been on Augmentin she has about 1 and half days of this left. Nonetheless she tells me that she does have a longstanding history of leg swelling. She does have chronic venous insufficiency as well it appears to me. She also has some evidence of may be early stages of lymphedema. She has a history of fibromyalgia, bilateral knee osteoarthritis, and appears to have a fungal infection of the right ankle region. In regard to the arthritis she tells me her legs tend to swell less after she gets her knee injections allowing her to be able to walk and get around much more effectively. Following the injections she does well for a while and then when that seems to be wearing off she tends to start having issues with more swelling because she is more mobile that makes sense from a venous standpoint. 10/10; I was asked to see this patient today who came in for a nurse visit. Firstly she has developed a widespread very pruritic rash involving her abdomen and back. Lesser extent on her feet. The second  problem is that she is complaining about the  tightness of the wound and wrap on the left leg where the wound is. 09/14/2021 upon evaluation today patient appears to be doing decently well in regard to her leg ulcer. The biggest issue she is having is a significant issue with a rash currently. I do not see any signs of active infection systemically which is great news although with regard to the rash she did see dermatology they told her she had eczema that does not seem to sit right with me out think that is mainly the issues she has going on. I do think she may benefit from the use of a short course of prednisone which should help with the rash which is absolutely driving her crazy. 09/21/2021 upon evaluation today patient appears to be doing decently well in regard to her wound this is still pretty deep. The prednisone has helped with the rash although is not completely done and it is doing much better than what it was. Fortunately there is no signs of active infection systemically at this time also great news. 09/28/2021 upon evaluation today patient's wound on her leg actually showing some signs of improvement which is good news I am happy in that regard. Fortunately there does not appear to be any evidence of active infection systemically which is great news. Unfortunately she is still continuing to have issues with her rash which is almost worse than the actual wound itself for her at this point. 10/05/2021 upon evaluation today patient appears to be doing well with regard to her leg ulcer. She is tolerating the dressing changes without complication and overall I am extremely pleased with where we stand. With that being said I do think that she could possibly benefit from a snap VAC if we can gain approval I am not certain if this will be improved or not. Nonetheless I definitely think we can give this a try and if we get approval I think this will help speed up the healing process to be honest.  In the interim I do believe the [facility] is doing decently well. Objective Constitutional Well-nourished and well-hydrated in no acute distress. Vitals Time Taken: 3:25 PM, Height: 63 in, Weight: 198 lbs, BMI: 35.1, Temperature: 97.8 F, Pulse: 84 bpm, Respiratory Rate: 16 breaths/min, Blood Pressure: 184/90 mmHg. Respiratory normal breathing without difficulty. Psychiatric this patient is able to make decisions and demonstrates good insight into disease process. Alert and Oriented x 3. pleasant and cooperative. General Notes: Upon inspection patient's wound bed actually showed signs of good granulation epithelization at this point. I do not see any evidence of infection currently and overall I am extremely pleased with where we stand. [name], [name] ([id number]) Integumentary (Hair, Skin) Wound #1 status is Open. Original cause of wound was Skin Tear/Laceration. The date acquired was: 07/30/2021. The wound has been in treatment 5 weeks. The wound is located on the Left,Anterior Lower Leg. The wound measures 2cm length x 0.6cm width x 0.8cm depth; 0.942cm^2 area and 0.754cm^3 volume. There is Fat Layer (Subcutaneous Tissue) exposed. There is no tunneling noted, however, there is undermining starting at 12:00 and ending at 12:00 with a maximum distance of 0.4cm. There is a medium amount of serosanguineous drainage noted. The wound margin is flat and intact. There is medium (34-66%) red granulation within the wound bed. There is a medium (34-66%) amount of necrotic tissue within the wound bed including Adherent Slough. Assessment Active Problems ICD-10 Venous insufficiency (chronic) (peripheral) Non-pressure chronic ulcer of other part of left lower  leg with fat layer exposed Fibromyalgia Bilateral primary osteoarthritis of knee Tinea corporis Rash and other nonspecific skin eruption Plan Follow-up Appointments: Return Appointment in 1 week. - once a week provider Nurse Visit  as needed - Twice a week dressing changes-nurse visit once per week Bathing/ Shower/ Hygiene: May shower with wound dressing protected with water repellent cover or cast protector. No tub bath. Edema Control - Lymphedema / Segmental Compressive Device / Other: Tubigrip single layer applied. - E Elevate, Exercise Daily and Avoid Standing for Long Periods of Time. Elevate legs to the level of the heart and pump ankles as often as possible Elevate leg(s) parallel to the floor when sitting. Medications-Please add to medication list.: Other: - finish prednisone as prescribed Try Allegra, Claritin or Zyrtec for allergic skin reactions WOUND #1: - Lower Leg Wound Laterality: Left, Anterior Cleanser: Soap and Water 2 x Per Week/30 Days Discharge Instructions: Gently cleanse wound with antibacterial soap, rinse and pat dry prior to dressing wounds Primary Dressing: Hydrofera Blue Ready Transfer Foam, 2.5x2.5 (in/in) 2 x Per Week/30 Days Discharge Instructions: Cut Hydrofera Blue Ready and apply to wound bed, secure with left over hydrofera blue Secondary Dressing: Zetuvit Plus Silicone Border Dressing 4x4 (in/in) 2 x Per Week/30 Days Compression Wrap: Tubi E 2 x Per Week/30 Days 1. I am good recommend that we going to continue with the wound care measures as before and the patient is in agreement with the plan. This includes the use of the [facility] for now. No debridement was even necessary today I think this is doing decently well. 2. I am also can see about getting approval for the snap VAC. If we can get this I think that could help fill this and faster and the patient is in agreement with that plan. We will see patient back for reevaluation in 1 week here in the clinic. If anything worsens or changes patient will contact our office for additional recommendations. Electronic Signature(s) Signed: 10/05/2021 4:54:17 PM By: Worthy Keeler PA-C Entered By: Worthy Keeler on 10/05/2021  16:54:17 [name], [name] ([id number]) -------------------------------------------------------------------------------- SuperBill Details Patient Name: [name], [name] Date of Service: 10/05/2021 Medical Record Number: [id number] Patient Account Number: [id number] Date of Birth/Sex: 1941/06/26 (80 y.o. F) Treating RN: Donnamarie Poag Primary Care Provider: [name] Other Clinician: Referring Provider: [name] Treating Provider/Extender: Skipper Cliche in Treatment: 5 Diagnosis Coding ICD-10 Codes Code Description I87.2 Venous insufficiency (chronic) (peripheral) L97.822 Non-pressure chronic ulcer of other part of left lower leg with fat layer exposed M79.7 Fibromyalgia M17.0 Bilateral primary osteoarthritis of knee B35.4 Tinea corporis R21 Rash and other nonspecific skin eruption Facility Procedures CPT4 Code: [id number] Description: [phone] - WOUND CARE VISIT-LEV 2 EST PT Modifier: Quantity: 1 Physician Procedures CPT4 Code: [id number] Description: 22025 - WC PHYS LEVEL 4 - EST PT Modifier: Quantity: 1 CPT4 Code: Description: ICD-10 Diagnosis Description I87.2 Venous insufficiency (chronic) (peripheral) L97.822 Non-pressure chronic ulcer of other part of left lower leg with fat lay M79.7 Fibromyalgia M17.0 Bilateral primary osteoarthritis of knee Modifier: er exposed Quantity: Electronic Signature(s) Signed: 10/05/2021 4:54:38 PM By: Worthy Keeler PA-C Previous Signature: 10/05/2021 4:07:12 PM Version By: Donnamarie Poag Entered By: Worthy Keeler on 10/05/2021 16:54:38

## 2021-10-05 NOTE — Progress Notes (Signed)
[name] [name] in [name]: 5 Education Assessment Education Provided To: Patient Education Topics Provided Wound Debridement: Wound/Skin Impairment: Engineer, maintenance) Signed: 10/05/2021 4:07:12 PM By: [name] [name] Entered By: [name] [name] on 10/05/2021 15:39:13 [name] [name] Boxer ([id number]) -------------------------------------------------------------------------------- Wound Assessment Details Patient Name: [name] [name] L. Date of Service:  10/05/2021 2:30 PM Medical Record Number: [id number] Patient Account Number: [id number] Date of Birth/Sex: 1941/03/16 (80 y.o. F) Treating RN: [name] [name] Primary Care Provider: Elsie [name] Other Clinician: Referring Provider: Elsie [name] Treating Provider/Extender: [name] [name]: 5 Wound Status Wound Number: 1 Primary Etiology: Venous Leg Ulcer Wound Location: Left, Anterior Lower Leg Wound Status: Open Wounding Event: Skin Tear/Laceration Comorbid History: Cataracts, Osteoarthritis, Received Radiation Date Acquired: 07/30/2021 Weeks Of [name]: 5 Clustered Wound: No Photos Wound Measurements Length: (cm) 2 Width: (cm) 0.6 Depth: (cm) 0.8 Area: (cm) 0.942 Volume: (cm) 0.754 % Reduction in Area: 25.1% % Reduction in Volume: -20.1% Epithelialization: Small (1-33%) Tunneling: No Undermining: Yes Starting Position (o'clock): 12 Ending Position (o'clock): 12 Maximum Distance: (cm) 0.4 Wound Description Classification: Full Thickness Without Exposed Support Structures Wound Margin: Flat and Intact Exudate Amount: Medium Exudate Type: Serosanguineous Exudate Color: red, brown Foul Odor After Cleansing: No Slough/Fibrino Yes Wound Bed Granulation Amount: Medium (34-66%) Exposed Structure Granulation Quality: Red Fascia Exposed: No Necrotic Amount: Medium (34-66%) Fat Layer (Subcutaneous Tissue) Exposed: Yes Necrotic Quality: Adherent Slough Tendon Exposed: No Muscle Exposed: No Joint Exposed: No Bone Exposed: No [name] Notes Wound #1 (Lower Leg) Wound Laterality: Left, Anterior Cleanser Klose, Timber L. ([id number]) Soap and Water Discharge Instruction: Gently cleanse wound with antibacterial soap, rinse and pat dry prior to dressing wounds Peri-Wound Care Triamcinolone Acetonide Cream, 0.1%, 15 (g) tube Discharge Instruction: Apply to legs Topical Primary Dressing Hydrofera Blue Ready Transfer Foam, 2.5x2.5 (in/in) Discharge  Instruction: Cut Hydrofera Blue Ready and apply to wound bed, secure with left over hydrofera blue Secondary Dressing Zetuvit Plus Silicone Border Dressing 4x4 (in/in) Secured With Compression Wrap Tubi E Compression Stockings Add-Ons Electronic Signature(s) Signed: 10/05/2021 4:07:12 PM By: [name] [name] Entered By: [name] [name] on 10/05/2021 15:28:32 [name] [name] ([id number]) -------------------------------------------------------------------------------- Vitals Details Patient Name: [name] [name] L. Date of Service: 10/05/2021 2:30 PM Medical Record Number: [id number] Patient Account Number: [id number] Date of Birth/Sex: 1941-01-24 (80 y.o. F) Treating RN: [name] [name] Primary Care Provider: Elsie [name] Other Clinician: Referring Provider: Elsie [name] Treating Provider/Extender: [name] [name] in [name]: 5 Vital Signs Time Taken: 15:25 Temperature (F): 97.8 Height (in): 63 Pulse (bpm): 84 Weight (lbs): 198 Respiratory Rate (breaths/min): 16 Body Mass Index (BMI): 35.1 Blood Pressure (mmHg): 184/90 Reference Range: 80 - 120 mg / dl Electronic Signature(s) Signed: 10/05/2021 4:07:12 PM By: [name] [name] Entered ByDonnamarie [name] on 10/05/2021 69:48:54  0.4 Undermining: Yes N/A N/A Classification: Full Thickness Without Exposed N/A N/A Support Structures Exudate Amount: Medium N/A N/A Exudate Type: Serosanguineous N/A N/A Exudate Color: red, brown N/A N/A Wound Margin: Flat and Intact N/A N/A Granulation Amount: Medium (34-66%) N/A N/A Granulation Quality: Red N/A N/A Necrotic Amount: Medium (34-66%) N/A N/A Exposed Structures: Fat Layer (Subcutaneous Tissue): N/A N/A Yes Fascia: No Tendon: No Muscle: No Joint: No Bone: No Epithelialization: Small (1-33%) N/A N/A [name] Notes Electronic Signature(s) Signed: 10/05/2021 4:07:12 PM By: [name] [name] [name] [name] L. ([id number]) Entered By: [name] [name] on 10/05/2021 15:36:14 [name] [name] L. ([id number]) -------------------------------------------------------------------------------- Multi-Disciplinary Care Plan Details Patient Name: [name] [name] L. Date of Service: 10/05/2021 2:30 PM Medical Record Number: [id number] Patient Account Number: [id number] Date of Birth/Sex: 12/10/1940 (80 y.o. F) Treating RN: [name] [name] Primary Care : [name] [name] Other Clinician: Referring : [name] [name] Treating /Extender: [name] [name] Weeks in [name]: 5 Active Inactive Necrotic Tissue Nursing Diagnoses: Impaired tissue integrity related to necrotic/devitalized  tissue Goals: Necrotic/devitalized tissue will be minimized in the wound bed Date Initiated: 08/31/2021 Target Resolution Date: 08/31/2021 Goal Status: Active Patient/caregiver will verbalize understanding of reason and process for debridement of necrotic tissue Date Initiated: 08/31/2021 Date Inactivated: 09/21/2021 Target Resolution Date: 08/31/2021 Goal Status: Met Interventions: Assess patient pain level pre-, during and post procedure and prior to discharge Provide education on necrotic tissue and debridement process [name] Activities: Apply topical anesthetic as ordered : 08/31/2021 Biologic debridement : 08/31/2021 Enzymatic debridement : 08/31/2021 Excisional debridement : 08/31/2021 Notes: Wound/Skin Impairment Nursing Diagnoses: Impaired tissue integrity Goals: Patient/caregiver will verbalize understanding of skin care regimen Date Initiated: 08/31/2021 Date Inactivated: 09/21/2021 Target Resolution Date: 08/31/2021 Goal Status: Met Ulcer/skin breakdown will have a volume reduction of 30% by week 4 Date Initiated: 08/31/2021 Target Resolution Date: 10/01/2021 Goal Status: Active Ulcer/skin breakdown will have a volume reduction of 50% by week 8 Date Initiated: 08/31/2021 Target Resolution Date: 10/31/2021 Goal Status: Active Ulcer/skin breakdown will have a volume reduction of 80% by week 12 Date Initiated: 08/31/2021 Target Resolution Date: 12/01/2021 Goal Status: Active Ulcer/skin breakdown will heal within 14 weeks Date Initiated: 08/31/2021 Target Resolution Date: 01/01/2022 Goal Status: Active Interventions: Assess patient/caregiver ability to obtain necessary supplies Assess patient/caregiver ability to perform ulcer/skin care regimen upon admission and as needed Assess ulceration(s) every visit Provide education on ulcer and skin care Myrie, Dietrich L. ([id number]) [name] Activities: Referred to DME  for dressing supplies : 08/31/2021 Skin care regimen  initiated : 08/31/2021 Notes: Electronic Signature(s) Signed: 10/05/2021 4:07:12 PM By: [name] [name] Entered By: [name] [name] on 10/05/2021 15:29:50 [name] [name] L. ([id number]) -------------------------------------------------------------------------------- Pain Assessment Details Patient Name: [name] [name] L. Date of Service: 10/05/2021 2:30 PM Medical Record Number: [id number] Patient Account Number: [id number] Date of Birth/Sex: 06/10/1941 (80 y.o. F) Treating RN: [name] [name] Primary Care : [name] [name] Other Clinician: Referring : [name] [name] Treating /Extender: [name] [name] Weeks in [name]: 5 Active Problems Location of Pain Severity and Description of Pain Patient Has Paino No Site Locations Rate the pain. Current Pain Level: 0 Pain Management and Medication Current Pain Management: Electronic Signature(s) Signed: 10/05/2021 4:07:12 PM By: [name] [name] Entered By: [name] [name] on 10/05/2021 15:26:29 [name] [name] L. ([id number]) -------------------------------------------------------------------------------- Patient/Caregiver Education Details Patient Name: [name] [name] L. Date of Service: 10/05/2021 2:30 PM Medical Record Number: [id number] Patient Account Number: [id number] Date of Birth/Gender: 11/17/1941 (80 y.o. F) Treating RN: [name] [name] Primary Care Physician: [name] [name] Other Clinician: Referring Physician: Duncan, [name] Treating Physician/Extender: Stone,   multiple wounds INTERVENTIONS - Wound Dressings X - Small Wound Dressing one or multiple wounds 1 10 [] - 0 Medium Wound Dressing one or multiple wounds [] - 0 Large Wound Dressing one or multiple wounds X- 1 5 Application of Medications - topical [] - 0 Application of Medications - injection INTERVENTIONS - Miscellaneous [] - External ear exam 0 [] - 0 Specimen Collection (cultures, biopsies, blood, body fluids, etc.) [] - 0 Specimen(s) / Culture(s) sent or taken to Lab for analysis [] - 0 Patient Transfer (multiple staff / Hoyer Lift / Similar devices) [] - 0 Simple Staple / Suture removal (25 or less) [] - 0 Complex Staple / Suture removal (26 or more) [] - 0 Hypo / Hyperglycemic Management (close monitor of Blood Glucose) [] - 0 Ankle / Brachial Index (ABI) - do not check if billed separately X- 1 5 Vital Signs Has the patient been seen at the hospital within the last three years: Yes Total Score: 65 Level Of Care: New/Established - Level 2 Electronic Signature(s) Signed: 10/05/2021 4:07:12 PM By: [name] [name] Entered By: [name] [name] on 10/05/2021 15:38:14 [name] [name] L. ([id number]) -------------------------------------------------------------------------------- Encounter Discharge Information Details Patient Name: Riedlinger, Avina L. Date of Service: 10/05/2021 2:30 PM Medical Record Number: [id number] Patient Account Number: [id number] Date of Birth/Sex: 10/03/1941 (80 y.o. F) Treating RN: [name] [name] Primary Care Provider: Duncan, [name] Other Clinician: Referring Provider: Duncan, [name] Treating Provider/Extender: [name] [name] Weeks in [name]: 5 Encounter Discharge Information Items Discharge Condition: Stable Ambulatory Status: Walker Discharge Destination:  Home Transportation: Private Auto Accompanied By: self Schedule Follow-up Appointment: Yes Clinical Summary of Care: Electronic Signature(s) Signed: 10/05/2021 4:07:12 PM By: [name] [name] Entered By: [name] [name] on 10/05/2021 15:40:24 [name] [name] L. ([id number]) -------------------------------------------------------------------------------- Lower Extremity Assessment Details Patient Name: Deleon, [name] L. Date of Service: 10/05/2021 2:30 PM Medical Record Number: [id number] Patient Account Number: [id number] Date of Birth/Sex: 09/12/1941 (80 y.o. F) Treating RN: [name] [name] Primary Care Provider: Duncan, [name] Other Clinician: Referring Provider: Duncan, [name] Treating Provider/Extender: [name] [name] Weeks in [name]: 5 Edema Assessment Assessed: [Left: Yes] [Right: No] Edema: [Left: Ye] [Right: s] Calf Left: Right: [name] of Measurement: 31 cm From Medial Instep 40 cm Ankle Left: Right: [name] of Measurement: 11 cm From Medial Instep 31 cm Knee To Floor Left: Right: From Medial Instep 40 cm Vascular Assessment Pulses: Dorsalis Pedis Palpable: [Left:Yes] Electronic Signature(s) Signed: 10/05/2021 4:07:12 PM By: [name] [name] Entered By: [name] [name] on 10/05/2021 15:29:27 Claycomb, Tuesday L. ([id number]) -------------------------------------------------------------------------------- Multi Wound Chart Details Patient Name: Perot, Bryony L. Date of Service: 10/05/2021 2:30 PM Medical Record Number: [id number] Patient Account Number: [id number] Date of Birth/Sex: 07/24/1941 (80 y.o. F) Treating RN: [name] [name] Primary Care Provider: Duncan, [name] Other Clinician: Referring Provider: Duncan, [name] Treating Provider/Extender: [name] [name] Weeks in [name]: 5 Vital Signs Height(in): 63 Pulse(bpm): 84 Weight(lbs): 198 Blood Pressure(mmHg): 184/90 Body Mass Index(BMI): 35 Temperature(°F): 97.8 Respiratory Rate(breaths/min): 16 Photos: [N/A:N/A] Wound  Location: Left, Anterior Lower Leg N/A N/A Wounding Event: Skin Tear/Laceration N/A N/A Primary Etiology: Venous Leg Ulcer N/A N/A Comorbid History: Cataracts, Osteoarthritis, Received N/A N/A Radiation Date Acquired: 07/30/2021 N/A N/A Weeks of [name]: 5 N/A N/A Wound Status: Open N/A N/A Measurements L x W x D (cm) 2x0.6x0.8 N/A N/A Area (cm) : 0.942 N/A N/A Volume (cm) : 0.754 N/A N/A % Reduction in Area: 25.10% N/A N/A % Reduction in Volume: -20.10% N/A N/A Starting Position 1 (o'clock): 12 Ending Position 1 (o'clock): 12 Maximum Distance 1 (cm):   multiple wounds INTERVENTIONS - Wound Dressings X - Small Wound Dressing one or multiple wounds 1 10 [] - 0 Medium Wound Dressing one or multiple wounds [] - 0 Large Wound Dressing one or multiple wounds X- 1 5 Application of Medications - topical [] - 0 Application of Medications - injection INTERVENTIONS - Miscellaneous [] - External ear exam 0 [] - 0 Specimen Collection (cultures, biopsies, blood, body fluids, etc.) [] - 0 Specimen(s) / Culture(s) sent or taken to Lab for analysis [] - 0 Patient Transfer (multiple staff / Hoyer Lift / Similar devices) [] - 0 Simple Staple / Suture removal (25 or less) [] - 0 Complex Staple / Suture removal (26 or more) [] - 0 Hypo / Hyperglycemic Management (close monitor of Blood Glucose) [] - 0 Ankle / Brachial Index (ABI) - do not check if billed separately X- 1 5 Vital Signs Has the patient been seen at the hospital within the last three years: Yes Total Score: 65 Level Of Care: New/Established - Level 2 Electronic Signature(s) Signed: 10/05/2021 4:07:12 PM By: [name] [name] Entered By: [name] [name] on 10/05/2021 15:38:14 [name] [name] L. ([id number]) -------------------------------------------------------------------------------- Encounter Discharge Information Details Patient Name: Riedlinger, Avina L. Date of Service: 10/05/2021 2:30 PM Medical Record Number: [id number] Patient Account Number: [id number] Date of Birth/Sex: 10/03/1941 (80 y.o. F) Treating RN: [name] [name] Primary Care Provider: Duncan, [name] Other Clinician: Referring Provider: Duncan, [name] Treating Provider/Extender: [name] [name] Weeks in [name]: 5 Encounter Discharge Information Items Discharge Condition: Stable Ambulatory Status: Walker Discharge Destination:  Home Transportation: Private Auto Accompanied By: self Schedule Follow-up Appointment: Yes Clinical Summary of Care: Electronic Signature(s) Signed: 10/05/2021 4:07:12 PM By: [name] [name] Entered By: [name] [name] on 10/05/2021 15:40:24 [name] [name] L. ([id number]) -------------------------------------------------------------------------------- Lower Extremity Assessment Details Patient Name: Deleon, [name] L. Date of Service: 10/05/2021 2:30 PM Medical Record Number: [id number] Patient Account Number: [id number] Date of Birth/Sex: 09/12/1941 (80 y.o. F) Treating RN: [name] [name] Primary Care Provider: Duncan, [name] Other Clinician: Referring Provider: Duncan, [name] Treating Provider/Extender: [name] [name] Weeks in [name]: 5 Edema Assessment Assessed: [Left: Yes] [Right: No] Edema: [Left: Ye] [Right: s] Calf Left: Right: [name] of Measurement: 31 cm From Medial Instep 40 cm Ankle Left: Right: [name] of Measurement: 11 cm From Medial Instep 31 cm Knee To Floor Left: Right: From Medial Instep 40 cm Vascular Assessment Pulses: Dorsalis Pedis Palpable: [Left:Yes] Electronic Signature(s) Signed: 10/05/2021 4:07:12 PM By: [name] [name] Entered By: [name] [name] on 10/05/2021 15:29:27 Claycomb, Tuesday L. ([id number]) -------------------------------------------------------------------------------- Multi Wound Chart Details Patient Name: Perot, Bryony L. Date of Service: 10/05/2021 2:30 PM Medical Record Number: [id number] Patient Account Number: [id number] Date of Birth/Sex: 07/24/1941 (80 y.o. F) Treating RN: [name] [name] Primary Care Provider: Duncan, [name] Other Clinician: Referring Provider: Duncan, [name] Treating Provider/Extender: [name] [name] Weeks in [name]: 5 Vital Signs Height(in): 63 Pulse(bpm): 84 Weight(lbs): 198 Blood Pressure(mmHg): 184/90 Body Mass Index(BMI): 35 Temperature(°F): 97.8 Respiratory Rate(breaths/min): 16 Photos: [N/A:N/A] Wound  Location: Left, Anterior Lower Leg N/A N/A Wounding Event: Skin Tear/Laceration N/A N/A Primary Etiology: Venous Leg Ulcer N/A N/A Comorbid History: Cataracts, Osteoarthritis, Received N/A N/A Radiation Date Acquired: 07/30/2021 N/A N/A Weeks of [name]: 5 N/A N/A Wound Status: Open N/A N/A Measurements L x W x D (cm) 2x0.6x0.8 N/A N/A Area (cm) : 0.942 N/A N/A Volume (cm) : 0.754 N/A N/A % Reduction in Area: 25.10% N/A N/A % Reduction in Volume: -20.10% N/A N/A Starting Position 1 (o'clock): 12 Ending Position 1 (o'clock): 12 Maximum Distance 1 (cm):

## 2021-10-10 ENCOUNTER — Other Ambulatory Visit: Payer: Self-pay

## 2021-10-10 ENCOUNTER — Encounter: Payer: PPO | Admitting: Physician Assistant

## 2021-10-10 DIAGNOSIS — I872 Venous insufficiency (chronic) (peripheral): Secondary | ICD-10-CM | POA: Diagnosis not present

## 2021-10-10 DIAGNOSIS — M797 Fibromyalgia: Secondary | ICD-10-CM | POA: Diagnosis not present

## 2021-10-10 DIAGNOSIS — M170 Bilateral primary osteoarthritis of knee: Secondary | ICD-10-CM | POA: Diagnosis not present

## 2021-10-10 DIAGNOSIS — L97822 Non-pressure chronic ulcer of other part of left lower leg with fat layer exposed: Secondary | ICD-10-CM | POA: Diagnosis not present

## 2021-10-10 NOTE — Progress Notes (Addendum)
[name], [name] ([id number]) Visit Report for 10/10/2021 Arrival Information Details Patient Name: [name], [name]. Date of Service: 10/10/2021 9:00 AM Medical Record Number: [id number] Patient Account Number: [id number] Date of Birth/Sex: 1941-11-22 (80 y.o. F) Treating RN: Huel Coventry Primary Care Pruitt Taboada: [name] Givens Other Clinician: Referring Donnalee Cellucci: [name] Givens Treating Davis Vannatter/Extender: Rowan Blase in Treatment: 5 Visit Information History Since Last Visit Added or deleted any medications: No Patient Arrived: Walker Has Dressing in Place as Prescribed: Yes Arrival Time: 09:15 Pain Present Now: No Accompanied By: self Transfer Assistance: None Patient Identification Verified: Yes Secondary Verification Process Completed: Yes Patient Requires Transmission-Based Precautions: No Patient Has Alerts: No Electronic Signature(s) Signed: 10/11/2021 4:58:16 PM By: Elliot Gurney, BSN, RN, CWS, Kim RN, BSN Entered By: Elliot Gurney, BSN, RN, CWS, Kim on 10/10/2021 09:16:06 Farquhar, [name] ([id number]) -------------------------------------------------------------------------------- Encounter Discharge Information Details Patient Name: [name], [name] Date of Service: 10/10/2021 9:00 AM Medical Record Number: [id number] Patient Account Number: [id number] Date of Birth/Sex: Aug 12, 1941 (80 y.o. F) Treating RN: Huel Coventry Primary Care Ela Moffat: [name] Givens Other Clinician: Referring Diamond Martucci: [name] Givens Treating Clementine Soulliere/Extender: Rowan Blase in Treatment: 5 Encounter Discharge Information Items Post Procedure Vitals Discharge Condition: Stable Temperature (F): 97.5 Ambulatory Status: Walker Pulse (bpm): 78 Discharge Destination: Home Respiratory Rate (breaths/min): 16 Transportation: Private Auto Blood Pressure (mmHg): 188/90 Schedule Follow-up Appointment: Yes Clinical Summary of Care: Electronic Signature(s) Signed: 10/11/2021 4:58:16 PM By: Elliot Gurney,  BSN, RN, CWS, Kim RN, BSN Entered By: Elliot Gurney, BSN, RN, CWS, Kim on 10/10/2021 10:05:05 Mayer, [name] ([id number]) -------------------------------------------------------------------------------- Lower Extremity Assessment Details Patient Name: [name], [name] Date of Service: 10/10/2021 9:00 AM Medical Record Number: [id number] Patient Account Number: [id number] Date of Birth/Sex: [birth year] (80 y.o. F) Treating RN: Huel Coventry Primary Care Florella Mcneese: [name] Givens Other Clinician: Referring Zair Borawski: [name] Givens Treating Micaila Ziemba/Extender: Allen Derry Weeks in Treatment: 5 Edema Assessment Assessed: [Left: No] [Right: No] [Left: Edema] [Right: :] Calf Left: Right: Point of Measurement: 31 cm From Medial Instep 42 cm Ankle Left: Right: Point of Measurement: 11 cm From Medial Instep 29 cm Vascular Assessment Pulses: Dorsalis Pedis Palpable: [Left:Yes] Electronic Signature(s) Signed: 10/11/2021 4:58:16 PM By: Elliot Gurney, BSN, RN, CWS, Kim RN, BSN Entered By: Elliot Gurney, BSN, RN, CWS, Kim on 10/10/2021 09:25:14 Ramson, [name] ([id number]) -------------------------------------------------------------------------------- Multi Wound Chart Details Patient Name: [name], [name] Date of Service: 10/10/2021 9:00 AM Medical Record Number: [id number] Patient Account Number: [id number] Date of Birth/Sex: [birth year] (80 y.o. F) Treating RN: Huel Coventry Primary Care Samari Gorby: [name] Givens Other Clinician: Referring Caelyn Route: [name] Givens Treating Charnese Federici/Extender: Rowan Blase in Treatment: 5 Vital Signs Height(in): 63 Pulse(bpm): 78 Weight(lbs): 198 Blood Pressure(mmHg): 188/90 Body Mass Index(BMI): 35 Temperature(F): 97.5 Respiratory Rate(breaths/min): 16 Photos: [N/A:N/A] Wound Location: Left, Anterior Lower Leg N/A N/A Wounding Event: Skin Tear/Laceration N/A N/A Primary Etiology: Venous Leg Ulcer N/A N/A Comorbid History: Cataracts, Osteoarthritis,  Received N/A N/A Radiation Date Acquired: 07/30/2021 N/A N/A Weeks of Treatment: 5 N/A N/A Wound Status: Open N/A N/A Measurements L x W x D (cm) 2x0.5x1 N/A N/A Area (cm) : 0.785 N/A N/A Volume (cm) : 0.785 N/A N/A % Reduction in Area: 37.50% N/A N/A % Reduction in Volume: -25.00% N/A N/A Position 1 (o'clock): 1 Maximum Distance 1 (cm): 1.1 Tunneling: Yes N/A N/A Classification: Full Thickness Without Exposed N/A N/A Support Structures Exudate Amount: Medium N/A N/A Exudate Type: Serosanguineous N/A N/A Exudate Color: red, brown N/A N/A Wound Margin: Flat and Intact N/A N/A Granulation Amount: Medium (34-66%) N/A N/A  Granulation Quality: Red N/A N/A Necrotic Amount: Medium (34-66%) N/A N/A Exposed Structures: Fat Layer (Subcutaneous Tissue): N/A N/A Yes Fascia: No Tendon: No Muscle: No Joint: No Bone: No Epithelialization: Small (1-33%) N/A N/A Treatment Notes Electronic Signature(s) Signed: 10/11/2021 4:58:16 PM By: Elliot Gurney, BSN, RN, CWS, Kim RN, BSN Entered By: Elliot Gurney, BSN, RN, CWS, Kim on 10/10/2021 09:50:29 Whitford, [name] ([id number]) HELYN, OZOLINS ([id number]) -------------------------------------------------------------------------------- Multi-Disciplinary Care Plan Details Patient Name: [name], [name] Date of Service: 10/10/2021 9:00 AM Medical Record Number: [id number] Patient Account Number: [id number] Date of Birth/Sex: 1941-01-05 (80 y.o. F) Treating RN: Huel Coventry Primary Care Tej Murdaugh: [name] Givens Other Clinician: Referring Shaunae Sieloff: [name] Givens Treating Toniette Devera/Extender: Rowan Blase in Treatment: 5 Active Inactive Necrotic Tissue Nursing Diagnoses: Impaired tissue integrity related to necrotic/devitalized tissue Goals: Necrotic/devitalized tissue will be minimized in the wound bed Date Initiated: 08/31/2021 Target Resolution Date: 08/31/2021 Goal Status: Active Patient/caregiver will verbalize understanding of reason and  process for debridement of necrotic tissue Date Initiated: 08/31/2021 Date Inactivated: 09/21/2021 Target Resolution Date: 08/31/2021 Goal Status: Met Interventions: Assess patient pain level pre-, during and post procedure and prior to discharge Provide education on necrotic tissue and debridement process Treatment Activities: Apply topical anesthetic as ordered : 08/31/2021 Biologic debridement : 08/31/2021 Enzymatic debridement : 08/31/2021 Excisional debridement : 08/31/2021 Notes: Wound/Skin Impairment Nursing Diagnoses: Impaired tissue integrity Goals: Patient/caregiver will verbalize understanding of skin care regimen Date Initiated: 08/31/2021 Date Inactivated: 09/21/2021 Target Resolution Date: 08/31/2021 Goal Status: Met Ulcer/skin breakdown will have a volume reduction of 30% by week 4 Date Initiated: 08/31/2021 Target Resolution Date: 10/01/2021 Goal Status: Active Ulcer/skin breakdown will have a volume reduction of 50% by week 8 Date Initiated: 08/31/2021 Target Resolution Date: 10/31/2021 Goal Status: Active Ulcer/skin breakdown will have a volume reduction of 80% by week 12 Date Initiated: 08/31/2021 Target Resolution Date: 12/01/2021 Goal Status: Active Ulcer/skin breakdown will heal within 14 weeks Date Initiated: 08/31/2021 Target Resolution Date: 01/01/2022 Goal Status: Active Interventions: Assess patient/caregiver ability to obtain necessary supplies Assess patient/caregiver ability to perform ulcer/skin care regimen upon admission and as needed Assess ulceration(s) every visit Provide education on ulcer and skin care KWANDA, DEWBRE ([id number]) Treatment Activities: Referred to DME Keryn Nessler for dressing supplies : 08/31/2021 Skin care regimen initiated : 08/31/2021 Notes: Electronic Signature(s) Signed: 10/11/2021 4:58:16 PM By: Elliot Gurney, BSN, RN, CWS, Kim RN, BSN Entered By: Elliot Gurney, BSN, RN, CWS, Kim on 10/10/2021 09:25:23 Pendry, [name]  ([id number]) -------------------------------------------------------------------------------- Pain Assessment Details Patient Name: Klinke, Ashlley L. Date of Service: 10/10/2021 9:00 AM Medical Record Number: [id number] Patient Account Number: [id number] Date of Birth/Sex: [birth year] (80 y.o. F) Treating RN: Huel Coventry Primary Care Lulia Schriner: [name] Givens Other Clinician: Referring Harman Ferrin: [name] Givens Treating Glendell Schlottman/Extender: Rowan Blase in Treatment: 5 Active Problems Location of Pain Severity and Description of Pain Patient Has Paino No Site Locations Pain Management and Medication Current Pain Management: Notes Patient denies pain at this time. Electronic Signature(s) Signed: 10/11/2021 4:58:16 PM By: Elliot Gurney, BSN, RN, CWS, Kim RN, BSN Entered By: Elliot Gurney, BSN, RN, CWS, Kim on 10/10/2021 09:17:48 Teegarden, [name] ([id number]) -------------------------------------------------------------------------------- Patient/Caregiver Education Details Patient Name: Stemmler, Ursala L. Date of Service: 10/10/2021 9:00 AM Medical Record Number: [id number] Patient Account Number: [id number] Date of Birth/Gender: May 14, 1941 (80 y.o. F) Treating RN: Huel Coventry Primary Care Physician: [name] Givens Other Clinician: Referring Physician: [name] Givens Treating Physician/Extender: Rowan Blase in Treatment: 5 Education Assessment Education Provided To: Patient Education Topics Provided Wound/Skin Impairment: Handouts: Caring for  Your Ulcer Methods: Demonstration, Explain/Verbal Responses: State content correctly Electronic Signature(s) Signed: 10/11/2021 4:58:16 PM By: Elliot Gurney, BSN, RN, CWS, Kim RN, BSN Entered By: Elliot Gurney, BSN, RN, CWS, Kim on 10/10/2021 10:03:09 Bundren, [name] ([id number]) -------------------------------------------------------------------------------- Wound Assessment Details Patient Name: Nahar, Garrett L. Date of Service: 10/10/2021 9:00  AM Medical Record Number: [id number] Patient Account Number: [id number] Date of Birth/Sex: 1941/07/07 (80 y.o. F) Treating RN: Huel Coventry Primary Care Maureen Delatte: [name] Givens Other Clinician: Referring Naiah Donahoe: [name] Givens Treating Charity Tessier/Extender: Allen Derry Weeks in Treatment: 5 Wound Status Wound Number: 1 Primary Etiology: Venous Leg Ulcer Wound Location: Left, Anterior Lower Leg Wound Status: Open Wounding Event: Skin Tear/Laceration Comorbid History: Cataracts, Osteoarthritis, Received Radiation Date Acquired: 07/30/2021 Weeks Of Treatment: 5 Clustered Wound: No Photos Wound Measurements Length: (cm) 2 Width: (cm) 0.5 Depth: (cm) 1 Area: (cm) 0.785 Volume: (cm) 0.785 % Reduction in Area: 37.5% % Reduction in Volume: -25% Epithelialization: Small (1-33%) Tunneling: Yes Position (o'clock): 1 Maximum Distance: (cm) 1.1 Wound Description Classification: Full Thickness Without Exposed Support Structu Wound Margin: Flat and Intact Exudate Amount: Medium Exudate Type: Serosanguineous Exudate Color: red, brown res Foul Odor After Cleansing: No Slough/Fibrino Yes Wound Bed Granulation Amount: Medium (34-66%) Exposed Structure Granulation Quality: Red Fascia Exposed: No Necrotic Amount: Medium (34-66%) Fat Layer (Subcutaneous Tissue) Exposed: Yes Necrotic Quality: Adherent Slough Tendon Exposed: No Muscle Exposed: No Joint Exposed: No Bone Exposed: No Treatment Notes Wound #1 (Lower Leg) Wound Laterality: Left, Anterior Cleanser Peri-Wound Care THAIZ, TROST ([id number]) Topical Primary Dressing Secondary Dressing Secured With Compression Wrap Compression Stockings Add-Ons Electronic Signature(s) Signed: 10/11/2021 4:58:16 PM By: Elliot Gurney, BSN, RN, CWS, Kim RN, BSN Entered By: Elliot Gurney, BSN, RN, CWS, Kim on 10/10/2021 09:24:10 Pursell, [name] ([id number]) -------------------------------------------------------------------------------- Vitals  Details Patient Name: Petrow, Derenda L. Date of Service: 10/10/2021 9:00 AM Medical Record Number: [id number] Patient Account Number: [id number] Date of Birth/Sex: 24-Dec-1940 (80 y.o. F) Treating RN: Huel Coventry Primary Care [place] Ice: [name] Givens Other Clinician: Referring Ariana Cavenaugh: [name] Givens Treating Jermane Brayboy/Extender: Rowan Blase in Treatment: 5 Vital Signs Time Taken: 09:15 Temperature (F): 97.5 Height (in): 63 Pulse (bpm): 78 Weight (lbs): 198 Respiratory Rate (breaths/min): 16 Body Mass Index (BMI): 35.1 Blood Pressure (mmHg): 188/90 Reference Range: 80 - 120 mg / dl Notes Patient does not take medication for blood pressure. Patient advised to follow up with PCP regarding elevated BP. Electronic Signature(s) Signed: 10/11/2021 4:58:16 PM By: Elliot Gurney, BSN, RN, CWS, Kim RN, BSN Entered By: Elliot Gurney, BSN, RN, CWS, Kim on 10/10/2021 09:17:32

## 2021-10-10 NOTE — Progress Notes (Addendum)
RASHON, REZEK ([id number]) Visit Report for 10/10/2021 Chief Complaint Document Details Patient Name: [name], [name] Date of Service: 10/10/2021 9:00 AM Medical Record Number: [id number] Patient Account Number: [id number] Date of Birth/Sex: 11/21/1941 (80 y.o. F) Treating RN: [name] Primary Care Provider: Elsie [name] Other Clinician: Referring Provider: Elsie [name] Treating Provider/Extender: [name] in Treatment: 5 Information Obtained from: Patient Chief Complaint Left LE Ulcer Electronic Signature(s) Signed: 10/10/2021 9:15:57 AM By: [name] Keeler PA-C Entered By: [name] on 10/10/2021 09:15:57 [name], [name] ([id number]) -------------------------------------------------------------------------------- Debridement Details Patient Name: [name], [name] Date of Service: 10/10/2021 9:00 AM Medical Record Number: [id number] Patient Account Number: [id number] Date of Birth/Sex: 03/13/1941 (80 y.o. F) Treating RN: [name] Primary Care Provider: Elsie [name] Other Clinician: Referring Provider: Elsie [name] Treating Provider/Extender: [name] in Treatment: 5 Debridement Performed for Wound #1 Left,Anterior Lower Leg Assessment: Performed By: Physician [name] Sams., PA-C Debridement Type: Chemical/Enzymatic/Mechanical Agent Used: gauze Severity of Tissue Pre Debridement: Limited to breakdown of skin Level of Consciousness (Pre- Awake and Alert procedure): Pre-procedure Verification/Time Out Yes - 09:51 Taken: Instrument: Other : gauze and q-tip Bleeding: Minimum Hemostasis Achieved: Pressure Response to Treatment: Procedure was tolerated well Level of Consciousness (Post- Awake and Alert procedure): Post Debridement Measurements of Total Wound Length: (cm) 2 Width: (cm) 0.5 Depth: (cm) 1 Volume: (cm) 0.785 Character of Wound/Ulcer Post Debridement: Stable Severity of Tissue Post Debridement: Fat layer  exposed Post Procedure Diagnosis Same as Pre-procedure Electronic Signature(s) Signed: 10/10/2021 4:51:13 PM By: [name] Keeler PA-C Signed: 10/11/2021 4:58:16 PM By: [name] Cool, BSN, RN, CWS, Kim RN, BSN Entered By: [name] on 10/10/2021 09:52:04 [name], [name] ([id number]) -------------------------------------------------------------------------------- HPI Details Patient Name: [name], [name] Date of Service: 10/10/2021 9:00 AM Medical Record Number: [id number] Patient Account Number: [id number] Date of Birth/Sex: Jan 14, 1941 (80 y.o. F) Treating RN: [name] Primary Care Provider: Elsie [name] Other Clinician: Referring Provider: Elsie [name] Treating Provider/Extender: [name] in Treatment: 5 History of Present Illness HPI Description: 08/31/2021 upon evaluation today patient presents for initial inspection here in our clinic concerning issues that she is having with a wound on her left anterior lower extremity. This is actually an area that she unfortunately tells me she was picking up a cage and dropped hitting the anterior portion of her shin causing a skin tear. She did not go to the hospital due to the fact that she did not realize that she would need to. This happened on July 30, 2021. With that being said the patient has been on Augmentin she has about 1 and half days of this left. Nonetheless she tells me that she does have a longstanding history of leg swelling. She does have chronic venous insufficiency as well it appears to me. She also has some evidence of may be early stages of lymphedema. She has a history of fibromyalgia, bilateral knee osteoarthritis, and appears to have a fungal infection of the right ankle region. In regard to the arthritis she tells me her legs tend to swell less after she gets her knee injections allowing her to be able to walk and get around much more effectively. Following the injections she does well for a  while and then when that seems to be wearing off she tends to start having issues with more swelling because she is more mobile that makes sense from a venous standpoint. 10/10; I was asked to see this patient today who came in  for a nurse visit. Firstly she has developed a widespread very pruritic rash involving her abdomen and back. Lesser extent on her feet. The second problem is that she is complaining about the tightness of the wound and wrap on the left leg where the wound is. 09/14/2021 upon evaluation today patient appears to be doing decently well in regard to her leg ulcer. The biggest issue she is having is a significant issue with a rash currently. I do not see any signs of active infection systemically which is great news although with regard to the rash she did see dermatology they told her she had eczema that does not seem to sit right with me out think that is mainly the issues she has going on. I do think she may benefit from the use of a short course of prednisone which should help with the rash which is absolutely driving her crazy. 09/21/2021 upon evaluation today patient appears to be doing decently well in regard to her wound this is still pretty deep. The prednisone has helped with the rash although is not completely done and it is doing much better than what it was. Fortunately there is no signs of active infection systemically at this time also great news. 09/28/2021 upon evaluation today patient's wound on her leg actually showing some signs of improvement which is good news I am happy in that regard. Fortunately there does not appear to be any evidence of active infection systemically which is great news. Unfortunately she is still continuing to have issues with her rash which is almost worse than the actual wound itself for her at this point. 10/05/2021 upon evaluation today patient appears to be doing well with regard to her leg ulcer. She is tolerating the dressing  changes without complication and overall I am extremely pleased with where we stand. With that being said I do think that she could possibly benefit from a snap VAC if we can gain approval I am not certain if this will be improved or not. Nonetheless I definitely think we can give this a try and if we get approval I think this will help speed up the healing process to be honest. In the interim I do believe the [facility] is doing decently well. 10/10/2021 upon evaluation today patient appears to be doing well with regard to her wound although is not significantly smaller. We still have quite a bit of depth here. Fortunately there is no signs of active infection systemically at this time. No fevers, chills, nausea, vomiting, or diarrhea. Electronic Signature(s) Signed: 10/10/2021 9:54:45 AM By: [name] Keeler PA-C Entered By: [name] on 10/10/2021 09:54:45 [name], [name] ([id number]) -------------------------------------------------------------------------------- Physical Exam Details Patient Name: [name], [name] Date of Service: 10/10/2021 9:00 AM Medical Record Number: [id number] Patient Account Number: [id number] Date of Birth/Sex: 1940/12/06 (80 y.o. F) Treating RN: [name] Primary Care Provider: Elsie [name] Other Clinician: Referring Provider: Elsie [name] Treating Provider/Extender: Jeri Cos Weeks in Treatment: 5 Constitutional Well-nourished and well-hydrated in no acute distress. Respiratory normal breathing without difficulty. Psychiatric this patient is able to make decisions and demonstrates good insight into disease process. Alert and Oriented x 3. pleasant and cooperative. Notes Upon inspection patient's wound bed does seem like it needs something that will stimulate some additional growth here. I do think that she would benefit from switching to endoform which may be a better option at this point. She is in agreement with giving this a trial.  Hopefully this will  stimulate some additional granular growth and help to start filling in this deeper area. Electronic Signature(s) Signed: 10/10/2021 9:55:10 AM By: [name] Keeler PA-C Entered By: [name] on 10/10/2021 09:55:10 [name], [name] ([id number]) -------------------------------------------------------------------------------- Physician Orders Details Patient Name: [name], [name] Date of Service: 10/10/2021 9:00 AM Medical Record Number: [id number] Patient Account Number: [id number] Date of Birth/Sex: [birth year] (80 y.o. F) Treating RN: [name] Primary Care Provider: Elsie [name] Other Clinician: Referring Provider: Elsie [name] Treating Provider/Extender: [name] in Treatment: 5 Verbal / Phone Orders: No Diagnosis Coding ICD-10 Coding Code Description I87.2 Venous insufficiency (chronic) (peripheral) L97.822 Non-pressure chronic ulcer of other part of left lower leg with fat layer exposed M79.7 Fibromyalgia M17.0 Bilateral primary osteoarthritis of knee B35.4 Tinea corporis R21 Rash and other nonspecific skin eruption Follow-up Appointments o Return Appointment in 1 week. - once a week provider o Nurse Visit as needed Bathing/ Shower/ Hygiene o May shower with wound dressing protected with water repellent cover or cast protector. o No tub bath. Edema Control - Lymphedema / Segmental Compressive Device / Other o Tubigrip single layer applied. - E o Elevate, Exercise Daily and Avoid Standing for Long Periods of Time. o Elevate legs to the level of the heart and pump ankles as often as possible o Elevate leg(s) parallel to the floor when sitting. Medications-Please add to medication list. o Other: - finish prednisone as prescribed Try Allegra, Claritin or Zyrtec for allergic skin reactions Wound Treatment Wound #1 - Lower Leg Wound Laterality: Left, Anterior Cleanser: Byram Ancillary Kit - 15 Day Supply (DME)  (Generic) 2 x Per Week/30 Days Discharge Instructions: Use supplies as instructed; Kit contains: (15) Saline Bullets; (15) 3x3 Gauze; 15 pr Gloves Cleanser: Soap and Water 2 x Per Week/30 Days Discharge Instructions: Gently cleanse wound with antibacterial soap, rinse and pat dry prior to dressing wounds Primary Dressing: endoform 2 x Per Week/30 Days Secondary Dressing: Zetuvit Plus Silicone Border Dressing 4x4 (in/in) (DME) (Generic) 2 x Per Week/30 Days Compression Wrap: Tubi E 2 x Per Week/30 Days Electronic Signature(s) Signed: 10/10/2021 4:51:13 PM By: [name] Keeler PA-C Signed: 10/11/2021 4:58:16 PM By: [name] Cool, BSN, RN, CWS, Kim RN, BSN Entered By: [name] on 10/10/2021 09:56:32 [name], [name] ([id number]) -------------------------------------------------------------------------------- Problem List Details Patient Name: [name], [name] Date of Service: 10/10/2021 9:00 AM Medical Record Number: [id number] Patient Account Number: [id number] Date of Birth/Sex: 18-Jul-1941 (80 y.o. F) Treating RN: [name] Primary Care Provider: Elsie [name] Other Clinician: Referring Provider: Elsie [name] Treating Provider/Extender: [name] in Treatment: 5 Active Problems ICD-10 Encounter Code Description Active Date MDM Diagnosis I87.2 Venous insufficiency (chronic) (peripheral) 08/31/2021 No Yes L97.822 Non-pressure chronic ulcer of other part of left lower leg with fat layer 08/31/2021 No Yes exposed M79.7 Fibromyalgia 08/31/2021 No Yes M17.0 Bilateral primary osteoarthritis of knee 08/31/2021 No Yes B35.4 Tinea corporis 08/31/2021 No Yes R21 Rash and other nonspecific skin eruption 09/04/2021 No Yes Inactive Problems Resolved Problems Electronic Signature(s) Signed: 10/10/2021 9:15:47 AM By: [name] Keeler PA-C Entered By: [name] on 10/10/2021 09:15:46 [name], [name]  ([id number]) -------------------------------------------------------------------------------- Progress Note Details Patient Name: [name], [name] Date of Service: 10/10/2021 9:00 AM Medical Record Number: [id number] Patient Account Number: [id number] Date of Birth/Sex: Apr 14, 1941 (80 y.o. F) Treating RN: [name] Primary Care Provider: Elsie [name] Other Clinician: Referring Provider: Elsie [name] Treating Provider/Extender: [name] in Treatment: 5 Subjective Chief Complaint Information obtained from Patient Left  LE Ulcer History of Present Illness (HPI) 08/31/2021 upon evaluation today patient presents for initial inspection here in our clinic concerning issues that she is having with a wound on her left anterior lower extremity. This is actually an area that she unfortunately tells me she was picking up a cage and dropped hitting the anterior portion of her shin causing a skin tear. She did not go to the hospital due to the fact that she did not realize that she would need to. This happened on July 30, 2021. With that being said the patient has been on Augmentin she has about 1 and half days of this left. Nonetheless she tells me that she does have a longstanding history of leg swelling. She does have chronic venous insufficiency as well it appears to me. She also has some evidence of may be early stages of lymphedema. She has a history of fibromyalgia, bilateral knee osteoarthritis, and appears to have a fungal infection of the right ankle region. In regard to the arthritis she tells me her legs tend to swell less after she gets her knee injections allowing her to be able to walk and get around much more effectively. Following the injections she does well for a while and then when that seems to be wearing off she tends to start having issues with more swelling because she is more mobile that makes sense from a venous standpoint. 10/10; I was asked to see this patient  today who came in for a nurse visit. Firstly she has developed a widespread very pruritic rash involving her abdomen and back. Lesser extent on her feet. The second problem is that she is complaining about the tightness of the wound and wrap on the left leg where the wound is. 09/14/2021 upon evaluation today patient appears to be doing decently well in regard to her leg ulcer. The biggest issue she is having is a significant issue with a rash currently. I do not see any signs of active infection systemically which is great news although with regard to the rash she did see dermatology they told her she had eczema that does not seem to sit right with me out think that is mainly the issues she has going on. I do think she may benefit from the use of a short course of prednisone which should help with the rash which is absolutely driving her crazy. 09/21/2021 upon evaluation today patient appears to be doing decently well in regard to her wound this is still pretty deep. The prednisone has helped with the rash although is not completely done and it is doing much better than what it was. Fortunately there is no signs of active infection systemically at this time also great news. 09/28/2021 upon evaluation today patient's wound on her leg actually showing some signs of improvement which is good news I am happy in that regard. Fortunately there does not appear to be any evidence of active infection systemically which is great news. Unfortunately she is still continuing to have issues with her rash which is almost worse than the actual wound itself for her at this point. 10/05/2021 upon evaluation today patient appears to be doing well with regard to her leg ulcer. She is tolerating the dressing changes without complication and overall I am extremely pleased with where we stand. With that being said I do think that she could possibly benefit from a snap VAC if we can gain approval I am not certain if this  will be improved  or not. Nonetheless I definitely think we can give this a try and if we get approval I think this will help speed up the healing process to be honest. In the interim I do believe the [facility] is doing decently well. 10/10/2021 upon evaluation today patient appears to be doing well with regard to her wound although is not significantly smaller. We still have quite a bit of depth here. Fortunately there is no signs of active infection systemically at this time. No fevers, chills, nausea, vomiting, or diarrhea. Objective Constitutional Well-nourished and well-hydrated in no acute distress. Vitals Time Taken: 9:15 AM, Height: 63 in, Weight: 198 lbs, BMI: 35.1, Temperature: 97.5 F, Pulse: 78 bpm, Respiratory Rate: 16 breaths/min, Blood Pressure: 188/90 mmHg. General Notes: Patient does not take medication for blood pressure. Patient advised to follow up with PCP regarding elevated BP. Respiratory normal breathing without difficulty. Psychiatric this patient is able to make decisions and demonstrates good insight into disease process. Alert and Oriented x 3. pleasant and cooperative. [name], [name] ([id number]) General Notes: Upon inspection patient's wound bed does seem like it needs something that will stimulate some additional growth here. I do think that she would benefit from switching to endoform which may be a better option at this point. She is in agreement with giving this a trial. Hopefully this will stimulate some additional granular growth and help to start filling in this deeper area. Integumentary (Hair, Skin) Wound #1 status is Open. Original cause of wound was Skin Tear/Laceration. The date acquired was: 07/30/2021. The wound has been in treatment 5 weeks. The wound is located on the Left,Anterior Lower Leg. The wound measures 2cm length x 0.5cm width x 1cm depth; 0.785cm^2 area and 0.785cm^3 volume. There is Fat Layer (Subcutaneous Tissue) exposed. There is  tunneling at 1:00 with a maximum distance of 1.1cm. There is a medium amount of serosanguineous drainage noted. The wound margin is flat and intact. There is medium (34-66%) red granulation within the wound bed. There is a medium (34-66%) amount of necrotic tissue within the wound bed including Adherent Slough. Assessment Active Problems ICD-10 Venous insufficiency (chronic) (peripheral) Non-pressure chronic ulcer of other part of left lower leg with fat layer exposed Fibromyalgia Bilateral primary osteoarthritis of knee Tinea corporis Rash and other nonspecific skin eruption Procedures Wound #1 Pre-procedure diagnosis of Wound #1 is a Venous Leg Ulcer located on the Left,Anterior Lower Leg .Severity of Tissue Pre Debridement is: Limited to breakdown of skin. There was a Chemical/Enzymatic/Mechanical debridement performed by [name] Sams., PA-C. With the following instrument (s): gauze and q-tip. Other agent used was gauze. A time out was conducted at 09:51, prior to the start of the procedure. A Minimum amount of bleeding was controlled with Pressure. The procedure was tolerated well. Post Debridement Measurements: 2cm length x 0.5cm width x 1cm depth; 0.785cm^3 volume. Character of Wound/Ulcer Post Debridement is stable. Severity of Tissue Post Debridement is: Fat layer exposed. Post procedure Diagnosis Wound #1: Same as Pre-Procedure Plan Follow-up Appointments: Return Appointment in 1 week. - once a week provider Nurse Visit as needed Bathing/ Shower/ Hygiene: May shower with wound dressing protected with water repellent cover or cast protector. No tub bath. Edema Control - Lymphedema / Segmental Compressive Device / Other: Tubigrip single layer applied. - E Elevate, Exercise Daily and Avoid Standing for Long Periods of Time. Elevate legs to the level of the heart and pump ankles as often as possible Elevate leg(s) parallel to the floor when sitting.  Medications-Please add to  medication list.: Other: - finish prednisone as prescribed Try Allegra, Claritin or Zyrtec for allergic skin reactions WOUND #1: - Lower Leg Wound Laterality: Left, Anterior Cleanser: Byram Ancillary Kit - 15 Day Supply (DME) (Generic) 2 x Per Week/30 Days Discharge Instructions: Use supplies as instructed; Kit contains: (15) Saline Bullets; (15) 3x3 Gauze; 15 pr Gloves Cleanser: Soap and Water 2 x Per Week/30 Days Discharge Instructions: Gently cleanse wound with antibacterial soap, rinse and pat dry prior to dressing wounds Primary Dressing: endoform 2 x Per Week/30 Days Secondary Dressing: Zetuvit Plus Silicone Border Dressing 4x4 (in/in) (DME) (Generic) 2 x Per Week/30 Days Compression Wrap: Tubi E 2 x Per Week/30 Days [place], Kaylyne L. ([id number]) 1. Would recommend that we going to continue with the wound care measures as before and the patient is in agreement the plan this includes the use of the endoform which we will get a try to stimulate some additional tissue growth. 2. Muscle and recommend that we have the patient continue to change this 2 times in between when she sees [place] for total 3 times per week I think this is good to be the best way to go. We will see patient back for reevaluation in 1 week here in the clinic. If anything worsens or changes patient will contact our office for additional recommendations. Electronic Signature(s) Signed: 10/17/2021 10:15:12 AM By: [name] Keeler PA-C Previous Signature: 10/10/2021 9:55:36 AM Version By: [name] Keeler PA-C Entered By: [name] on 10/17/2021 10:15:12 Cornette, [name] ([id number]) -------------------------------------------------------------------------------- SuperBill Details Patient Name: Linhart, Denora L. Date of Service: 10/10/2021 Medical Record Number: [id number] Patient Account Number: [id number] Date of Birth/Sex: [birth year] (80 y.o. F) Treating RN: [name] Primary Care Provider: Elsie [name]  Other Clinician: Referring Provider: Elsie [name] Treating Provider/Extender: [name] in Treatment: 5 Diagnosis Coding ICD-10 Codes Code Description I87.2 Venous insufficiency (chronic) (peripheral) L97.822 Non-pressure chronic ulcer of other part of left lower leg with fat layer exposed M79.7 Fibromyalgia M17.0 Bilateral primary osteoarthritis of knee B35.4 Tinea corporis R21 Rash and other nonspecific skin eruption Facility Procedures CPT4 Code: 14709295 Description: [id number] - DEBRIDE W/O ANES NON SELECT Modifier: Quantity: 1 Physician Procedures CPT4 Code: 0370964 Description: 38381 - WC PHYS LEVEL 3 - EST PT Modifier: Quantity: 1 CPT4 Code: Description: ICD-10 Diagnosis Description I87.2 Venous insufficiency (chronic) (peripheral) L97.822 Non-pressure chronic ulcer of other part of left lower leg with fat lay M79.7 Fibromyalgia M17.0 Bilateral primary osteoarthritis of knee Modifier: er exposed Quantity: Electronic Signature(s) Signed: 10/10/2021 9:55:56 AM By: [name] Keeler PA-C Entered By: [name] on 10/10/2021 09:55:56

## 2021-10-11 DIAGNOSIS — I872 Venous insufficiency (chronic) (peripheral): Secondary | ICD-10-CM | POA: Diagnosis not present

## 2021-10-12 ENCOUNTER — Other Ambulatory Visit: Payer: Self-pay

## 2021-10-12 ENCOUNTER — Encounter: Payer: Self-pay | Admitting: Family Medicine

## 2021-10-12 ENCOUNTER — Ambulatory Visit: Payer: PPO

## 2021-10-12 ENCOUNTER — Ambulatory Visit (INDEPENDENT_AMBULATORY_CARE_PROVIDER_SITE_OTHER): Payer: PPO | Admitting: Family Medicine

## 2021-10-12 VITALS — BP 150/82 | HR 87 | Temp 98.1°F | Ht 64.0 in | Wt 200.0 lb

## 2021-10-12 DIAGNOSIS — S81802A Unspecified open wound, left lower leg, initial encounter: Secondary | ICD-10-CM

## 2021-10-12 DIAGNOSIS — Z23 Encounter for immunization: Secondary | ICD-10-CM

## 2021-10-12 DIAGNOSIS — R030 Elevated blood-pressure reading, without diagnosis of hypertension: Secondary | ICD-10-CM

## 2021-10-12 DIAGNOSIS — R21 Rash and other nonspecific skin eruption: Secondary | ICD-10-CM | POA: Diagnosis not present

## 2021-10-12 DIAGNOSIS — S81802D Unspecified open wound, left lower leg, subsequent encounter: Secondary | ICD-10-CM

## 2021-10-12 NOTE — Progress Notes (Signed)
This visit occurred during the SARS-CoV-2 public health emergency.  Safety protocols were in place, including screening questions prior to the visit, additional usage of staff PPE, and extensive cleaning of exam room while observing appropriate contact time as indicated for disinfecting solutions.  She had higher BP readings at the wound clinic.  Not at goal but lower today.  She has h/o med intolerances.  She has a h/o white coat BP elevations when checked at hospital, compared to outside BP checks.  She has checked BP at home, and was "normal" per patient report.  Not lightheaded on standing.  Not SOB.  Still with some BLE edema at baseline.  She has some knee pain at baseline, prev with injections but that was put on hold given the prev leg injury/wound clinic treatment.  She hadn't been taking vit D recently, over the last month or so.    2x3 cm healing lesion on the L anterior shin, mildly macerated.  She is keeping it dressed and followed by the wound clinic.  No spreading erythema.    Her husband has a cold but is improving recently.  He felt better today.  She had seen dermatology clinics about rash on back.  Was told eczema vs fungal rash.  She didn't start tx with terbinafine.  She was worried about taking systemic treatment.  Husband had similar, both after getting new mattresses.  She just started a topical antifungal bodywash, family thought it looks some better now- unclear if from medicine or for another reason.  Meds, vitals, and allergies reviewed.  ROS: Per HPI unless specifically indicated in ROS section   Recheck BP 150/82.   GEN: nad, alert and oriented HEENT:ncat NECK: supple w/o LA CV: rrr.  PULM: ctab, no inc wob ABD: soft, +bs EXT: no edema SKIN: 2x3 cm healing lesion on the L anterior shin, mildly macerated.  No ulceration.  35 minutes were devoted to patient care in this encounter (this includes time spent reviewing the patient's file/history, interviewing and  examining the patient, counseling/reviewing plan with patient).

## 2021-10-12 NOTE — Patient Instructions (Addendum)
Use the antifungal body wash for about 10-14 days consistently.  If not better, let me know.    Check your BP a few more times at home.  Check it in the AM, at rest, after using the bathroom and resting for a few minutes.  Let me know.    Limit salt, keep drinking water, and update me needed.    Take care.  Glad to see you.

## 2021-10-13 ENCOUNTER — Ambulatory Visit: Payer: PPO

## 2021-10-15 DIAGNOSIS — S81802A Unspecified open wound, left lower leg, initial encounter: Secondary | ICD-10-CM | POA: Insufficient documentation

## 2021-10-15 DIAGNOSIS — R030 Elevated blood-pressure reading, without diagnosis of hypertension: Secondary | ICD-10-CM | POA: Insufficient documentation

## 2021-10-15 DIAGNOSIS — S81802D Unspecified open wound, left lower leg, subsequent encounter: Secondary | ICD-10-CM | POA: Insufficient documentation

## 2021-10-15 NOTE — Assessment & Plan Note (Signed)
I asked her to use the antifungal body wash for about 10-14 days consistently.  If not better, let me know.

## 2021-10-15 NOTE — Assessment & Plan Note (Signed)
Slowly healing and I think it makes sense to continue with the plan from the wound clinic.  Discussed.

## 2021-10-15 NOTE — Assessment & Plan Note (Signed)
Discussed options with patient I asked her to check her BP a few more times at home.  Check it in the AM, at rest, after using the bathroom and resting for a few minutes.  She can update me about her blood pressure readings.   Limit salt, keep drinking water, and update me needed.

## 2021-10-16 ENCOUNTER — Ambulatory Visit: Payer: PPO | Admitting: Physician Assistant

## 2021-10-17 ENCOUNTER — Other Ambulatory Visit: Payer: Self-pay

## 2021-10-17 ENCOUNTER — Encounter: Payer: PPO | Admitting: Physician Assistant

## 2021-10-17 DIAGNOSIS — L97822 Non-pressure chronic ulcer of other part of left lower leg with fat layer exposed: Secondary | ICD-10-CM | POA: Diagnosis not present

## 2021-10-17 DIAGNOSIS — M170 Bilateral primary osteoarthritis of knee: Secondary | ICD-10-CM | POA: Diagnosis not present

## 2021-10-17 DIAGNOSIS — I872 Venous insufficiency (chronic) (peripheral): Secondary | ICD-10-CM | POA: Diagnosis not present

## 2021-10-17 DIAGNOSIS — M797 Fibromyalgia: Secondary | ICD-10-CM | POA: Diagnosis not present

## 2021-10-17 NOTE — Progress Notes (Addendum)
[name] ([id number]) Visit Report for 10/17/2021 Chief Complaint Document Details Patient Name: [name], [name] Date of Service: 10/17/2021 9:30 AM Medical Record Number: [id number] Patient Account Number: [id number] Date of Birth/Sex: 27-Dec-1940 (80 y.o. F) Treating RN: Carlene Coria Primary Care Provider: Elsie [name] Other Clinician: Referring Provider: Elsie [name] Treating Provider/Extender: Skipper Cliche in Treatment: 6 Information Obtained from: Patient Chief Complaint Left LE Ulcer Electronic Signature(s) Signed: 10/17/2021 9:48:39 AM By: Worthy Keeler PA-C Entered By: Worthy Keeler on 10/17/2021 09:48:38 [name], [name] ([id number]) -------------------------------------------------------------------------------- HPI Details Patient Name: [name], [name] Date of Service: 10/17/2021 9:30 AM Medical Record Number: [id number] Patient Account Number: [id number] Date of Birth/Sex: 03/20/1941 (80 y.o. F) Treating RN: Carlene Coria Primary Care Provider: Elsie [name] Other Clinician: Referring Provider: Elsie [name] Treating Provider/Extender: Skipper Cliche in Treatment: 6 History of Present Illness HPI Description: 08/31/2021 upon evaluation today patient presents for initial inspection here in our clinic concerning issues that she is having with a wound on her left anterior lower extremity. This is actually an area that she unfortunately tells me she was picking up a cage and dropped hitting the anterior portion of her shin causing a skin tear. She did not go to the hospital due to the fact that she did not realize that she would need to. This happened on July 30, 2021. With that being said the patient has been on Augmentin she has about 1 and half days of this left. Nonetheless she tells me that she does have a longstanding history of leg swelling. She does have chronic venous insufficiency as well it appears to me. She also has some  evidence of may be early stages of lymphedema. She has a history of fibromyalgia, bilateral knee osteoarthritis, and appears to have a fungal infection of the right ankle region. In regard to the arthritis she tells me her legs tend to swell less after she gets her knee injections allowing her to be able to walk and get around much more effectively. Following the injections she does well for a while and then when that seems to be wearing off she tends to start having issues with more swelling because she is more mobile that makes sense from a venous standpoint. 10/10; I was asked to see this patient today who came in for a nurse visit. Firstly she has developed a widespread very pruritic rash involving her abdomen and back. Lesser extent on her feet. The second problem is that she is complaining about the tightness of the wound and wrap on the left leg where the wound is. 09/14/2021 upon evaluation today patient appears to be doing decently well in regard to her leg ulcer. The biggest issue she is having is a significant issue with a rash currently. I do not see any signs of active infection systemically which is great news although with regard to the rash she did see dermatology they told her she had eczema that does not seem to sit right with me out think that is mainly the issues she has going on. I do think she may benefit from the use of a short course of prednisone which should help with the rash which is absolutely driving her crazy. 09/21/2021 upon evaluation today patient appears to be doing decently well in regard to her wound this is still pretty deep. The prednisone has helped with the rash although is not completely done and it is doing much better than what it was. Fortunately there is  no signs of active infection systemically at this time also great news. 09/28/2021 upon evaluation today patient's wound on her leg actually showing some signs of improvement which is good news I am happy  in that regard. Fortunately there does not appear to be any evidence of active infection systemically which is great news. Unfortunately she is still continuing to have issues with her rash which is almost worse than the actual wound itself for her at this point. 10/05/2021 upon evaluation today patient appears to be doing well with regard to her leg ulcer. She is tolerating the dressing changes without complication and overall I am extremely pleased with where we stand. With that being said I do think that she could possibly benefit from a snap VAC if we can gain approval I am not certain if this will be improved or not. Nonetheless I definitely think we can give this a try and if we get approval I think this will help speed up the healing process to be honest. In the interim I do believe the [facility] is doing decently well. 10/10/2021 upon evaluation today patient appears to be doing well with regard to her wound although is not significantly smaller. We still have quite a bit of depth here. Fortunately there is no signs of active infection systemically at this time. No fevers, chills, nausea, vomiting, or diarrhea. 10/17/2021 upon evaluation today patient appears to be doing poorly in regard to her leg compared to last time there is a lot of irritation today. She actually kept the Zetuvit on all week which does not seem to have been beneficial for her. She was supposed to change it at home we did order her supplies but she did not even find the supplies on her porch until she tells me yesterday. Either way we had already decided that the best thing was probably can to be to have her come in for nurse visits. I think that still probably the best thing to do I feel like there is a bit of dementia here that I was not aware of last week or even the previous visit to be honest that is going to prohibit her from being able to make these dressing changes herself. Electronic Signature(s) Signed:  10/17/2021 11:51:44 AM By: Worthy Keeler PA-C Entered By: Worthy Keeler on 10/17/2021 11:51:44 [name], [name] ([id number]) -------------------------------------------------------------------------------- Physical Exam Details Patient Name: [name], [name] Date of Service: 10/17/2021 9:30 AM Medical Record Number: [id number] Patient Account Number: [id number] Date of Birth/Sex: 27-Nov-1940 (80 y.o. F) Treating RN: Carlene Coria Primary Care Provider: Elsie [name] Other Clinician: Referring Provider: Elsie [name] Treating Provider/Extender: Skipper Cliche in Treatment: 6 Constitutional Well-nourished and well-hydrated in no acute distress. Respiratory normal breathing without difficulty. Psychiatric this patient is able to make decisions and demonstrates good insight into disease process. Alert and Oriented x 3. pleasant and cooperative. Notes Upon inspection patient's wound bed actually showed signs of good granulation and epithelization at this point once I was able to clear away some of the necrotic debris she actually seems to be doing a little bit better. Nonetheless I think this needs to be changed here in the clinic more frequently I do not believe she can handle this at home. Electronic Signature(s) Signed: 10/17/2021 11:52:09 AM By: Worthy Keeler PA-C Entered By: Worthy Keeler on 10/17/2021 11:52:09 [name], [name] ([id number]) -------------------------------------------------------------------------------- Physician Orders Details Patient Name: [name], [name] Date of Service: 10/17/2021 9:30 AM Medical Record Number: [id number]  Patient Account Number: [id number] Date of Birth/Sex: 26-May-1941 (80 y.o. F) Treating RN: Carlene Coria Primary Care Provider: Elsie [name] Other Clinician: Referring Provider: Elsie [name] Treating Provider/Extender: Skipper Cliche in Treatment: 6 Verbal / Phone Orders: No Diagnosis Coding ICD-10 Coding Code  Description I87.2 Venous insufficiency (chronic) (peripheral) L97.822 Non-pressure chronic ulcer of other part of left lower leg with fat layer exposed M79.7 Fibromyalgia M17.0 Bilateral primary osteoarthritis of knee B35.4 Tinea corporis R21 Rash and other nonspecific skin eruption Follow-up Appointments o Return Appointment in 1 week. - once a week provider o Nurse Visit as needed - once per week Bathing/ Shower/ Hygiene o May shower with wound dressing protected with water repellent cover or cast protector. o No tub bath. Edema Control - Lymphedema / Segmental Compressive Device / Other o Tubigrip single layer applied. - E o Elevate, Exercise Daily and Avoid Standing for Long Periods of Time. o Elevate legs to the level of the heart and pump ankles as often as possible o Elevate leg(s) parallel to the floor when sitting. Medications-Please add to medication list. o Other: - finish prednisone as prescribed Try Allegra, Claritin or Zyrtec for allergic skin reactions Wound Treatment Wound #1 - Lower Leg Wound Laterality: Left, Anterior Cleanser: Byram Ancillary Kit - 15 Day Supply (Generic) 2 x Per Week/30 Days Discharge Instructions: Use supplies as instructed; Kit contains: (15) Saline Bullets; (15) 3x3 Gauze; 15 pr Gloves Cleanser: Soap and Water 2 x Per Week/30 Days Discharge Instructions: Gently cleanse wound with antibacterial soap, rinse and pat dry prior to dressing wounds Topical: Triamcinolone Acetonide Cream, 0.1%, 15 (g) tube 2 x Per Week/30 Days Discharge Instructions: Apply as directed by provider. Primary Dressing: Endoform Antimicrobial, 2x2 (in/in) 2 x Per Week/30 Days Secondary Dressing: ecypse 2 x Per Week/30 Days Compression Wrap: Tubi E 2 x Per Week/30 Days Electronic Signature(s) Signed: 10/17/2021 5:02:50 PM By: Worthy Keeler PA-C Signed: 11/01/2021 1:44:34 PM By: Carlene Coria RN Entered By: Carlene Coria on 10/17/2021 10:19:11 Suleiman,  [name] ([id number]) -------------------------------------------------------------------------------- Problem List Details Patient Name: Ewton, Erinne L. Date of Service: 10/17/2021 9:30 AM Medical Record Number: [id number] Patient Account Number: [id number] Date of Birth/Sex: September 18, 1941 (80 y.o. F) Treating RN: Carlene Coria Primary Care Provider: Elsie [name] Other Clinician: Referring Provider: Elsie [name] Treating Provider/Extender: Skipper Cliche in Treatment: 6 Active Problems ICD-10 Encounter Code Description Active Date MDM Diagnosis I87.2 Venous insufficiency (chronic) (peripheral) 08/31/2021 No Yes L97.822 Non-pressure chronic ulcer of other part of left lower leg with fat layer 08/31/2021 No Yes exposed M79.7 Fibromyalgia 08/31/2021 No Yes M17.0 Bilateral primary osteoarthritis of knee 08/31/2021 No Yes B35.4 Tinea corporis 08/31/2021 No Yes R21 Rash and other nonspecific skin eruption 09/04/2021 No Yes Inactive Problems Resolved Problems Electronic Signature(s) Signed: 10/17/2021 9:48:29 AM By: Worthy Keeler PA-C Entered By: Worthy Keeler on 10/17/2021 09:48:29 [name], [name] ([id number]) -------------------------------------------------------------------------------- Progress Note Details Patient Name: [name], [name] Date of Service: 10/17/2021 9:30 AM Medical Record Number: [id number] Patient Account Number: [id number] Date of Birth/Sex: 13-Jan-1941 (80 y.o. F) Treating RN: Carlene Coria Primary Care Provider: Elsie [name] Other Clinician: Referring Provider: Elsie [name] Treating Provider/Extender: Skipper Cliche in Treatment: 6 Subjective Chief Complaint Information obtained from Patient Left LE Ulcer History of Present Illness (HPI) 08/31/2021 upon evaluation today patient presents for initial inspection here in our clinic concerning issues that she is having with a wound on her left anterior lower extremity. This is actually an  area that she unfortunately tells  me she was picking up a cage and dropped hitting the anterior portion of her shin causing a skin tear. She did not go to the hospital due to the fact that she did not realize that she would need to. This happened on July 30, 2021. With that being said the patient has been on Augmentin she has about 1 and half days of this left. Nonetheless she tells me that she does have a longstanding history of leg swelling. She does have chronic venous insufficiency as well it appears to me. She also has some evidence of may be early stages of lymphedema. She has a history of fibromyalgia, bilateral knee osteoarthritis, and appears to have a fungal infection of the right ankle region. In regard to the arthritis she tells me her legs tend to swell less after she gets her knee injections allowing her to be able to walk and get around much more effectively. Following the injections she does well for a while and then when that seems to be wearing off she tends to start having issues with more swelling because she is more mobile that makes sense from a venous standpoint. 10/10; I was asked to see this patient today who came in for a nurse visit. Firstly she has developed a widespread very pruritic rash involving her abdomen and back. Lesser extent on her feet. The second problem is that she is complaining about the tightness of the wound and wrap on the left leg where the wound is. 09/14/2021 upon evaluation today patient appears to be doing decently well in regard to her leg ulcer. The biggest issue she is having is a significant issue with a rash currently. I do not see any signs of active infection systemically which is great news although with regard to the rash she did see dermatology they told her she had eczema that does not seem to sit right with me out think that is mainly the issues she has going on. I do think she may benefit from the use of a short course of prednisone  which should help with the rash which is absolutely driving her crazy. 09/21/2021 upon evaluation today patient appears to be doing decently well in regard to her wound this is still pretty deep. The prednisone has helped with the rash although is not completely done and it is doing much better than what it was. Fortunately there is no signs of active infection systemically at this time also great news. 09/28/2021 upon evaluation today patient's wound on her leg actually showing some signs of improvement which is good news I am happy in that regard. Fortunately there does not appear to be any evidence of active infection systemically which is great news. Unfortunately she is still continuing to have issues with her rash which is almost worse than the actual wound itself for her at this point. 10/05/2021 upon evaluation today patient appears to be doing well with regard to her leg ulcer. She is tolerating the dressing changes without complication and overall I am extremely pleased with where we stand. With that being said I do think that she could possibly benefit from a snap VAC if we can gain approval I am not certain if this will be improved or not. Nonetheless I definitely think we can give this a try and if we get approval I think this will help speed up the healing process to be honest. In the interim I do believe the [facility] is doing decently well. 10/10/2021 upon  evaluation today patient appears to be doing well with regard to her wound although is not significantly smaller. We still have quite a bit of depth here. Fortunately there is no signs of active infection systemically at this time. No fevers, chills, nausea, vomiting, or diarrhea. 10/17/2021 upon evaluation today patient appears to be doing poorly in regard to her leg compared to last time there is a lot of irritation today. She actually kept the Zetuvit on all week which does not seem to have been beneficial for her. She was  supposed to change it at home we did order her supplies but she did not even find the supplies on her porch until she tells me yesterday. Either way we had already decided that the best thing was probably can to be to have her come in for nurse visits. I think that still probably the best thing to do I feel like there is a bit of dementia here that I was not aware of last week or even the previous visit to be honest that is going to prohibit her from being able to make these dressing changes herself. Objective Constitutional Well-nourished and well-hydrated in no acute distress. Vitals Time Taken: 9:48 AM, Height: 63 in, Weight: 198 lbs, BMI: 35.1, Temperature: 97.9 F, Pulse: 73 bpm, Respiratory Rate: 18 breaths/min, Blood Pressure: 182/105 mmHg. [name], [name] ([id number]) Respiratory normal breathing without difficulty. Psychiatric this patient is able to make decisions and demonstrates good insight into disease process. Alert and Oriented x 3. pleasant and cooperative. General Notes: Upon inspection patient's wound bed actually showed signs of good granulation and epithelization at this point once I was able to clear away some of the necrotic debris she actually seems to be doing a little bit better. Nonetheless I think this needs to be changed here in the clinic more frequently I do not believe she can handle this at home. Integumentary (Hair, Skin) Wound #1 status is Open. Original cause of wound was Skin Tear/Laceration. The date acquired was: 07/30/2021. The wound has been in treatment 6 weeks. The wound is located on the Left,Anterior Lower Leg. The wound measures 2cm length x 0.6cm width x 0.6cm depth; 0.942cm^2 area and 0.565cm^3 volume. There is Fat Layer (Subcutaneous Tissue) exposed. There is no tunneling or undermining noted. There is a medium amount of serosanguineous drainage noted. The wound margin is flat and intact. There is medium (34-66%) red granulation within the  wound bed. There is a medium (34-66%) amount of necrotic tissue within the wound bed including Adherent Slough. Assessment Active Problems ICD-10 Venous insufficiency (chronic) (peripheral) Non-pressure chronic ulcer of other part of left lower leg with fat layer exposed Fibromyalgia Bilateral primary osteoarthritis of knee Tinea corporis Rash and other nonspecific skin eruption Plan Follow-up Appointments: Return Appointment in 1 week. - once a week provider Nurse Visit as needed - once per week Bathing/ Shower/ Hygiene: May shower with wound dressing protected with water repellent cover or cast protector. No tub bath. Edema Control - Lymphedema / Segmental Compressive Device / Other: Tubigrip single layer applied. - E Elevate, Exercise Daily and Avoid Standing for Long Periods of Time. Elevate legs to the level of the heart and pump ankles as often as possible Elevate leg(s) parallel to the floor when sitting. Medications-Please add to medication list.: Other: - finish prednisone as prescribed Try Allegra, Claritin or Zyrtec for allergic skin reactions WOUND #1: - Lower Leg Wound Laterality: Left, Anterior Cleanser: Byram Ancillary Kit - 15 Day Supply (Generic)  2 x Per Week/30 Days Discharge Instructions: Use supplies as instructed; Kit contains: (15) Saline Bullets; (15) 3x3 Gauze; 15 pr Gloves Cleanser: Soap and Water 2 x Per Week/30 Days Discharge Instructions: Gently cleanse wound with antibacterial soap, rinse and pat dry prior to dressing wounds Topical: Triamcinolone Acetonide Cream, 0.1%, 15 (g) tube 2 x Per Week/30 Days Discharge Instructions: Apply as directed by provider. Primary Dressing: Endoform Antimicrobial, 2x2 (in/in) 2 x Per Week/30 Days Secondary Dressing: ecypse 2 x Per Week/30 Days Compression Wrap: Tubi E 2 x Per Week/30 Days 1. Would recommend currently that we going to continue with the wound care measures as before and the patient is in agreement with  the plan this includes the endoform which I think is doing a little bit better from the wound standpoint. 2. We use a little bit of triamcinolone around the edges of the wound and then subsequently I am also going to go ahead and recommend that we use endoclips dressing to catch any excess drainage along with Tubigrip and again we will be changing that here in the clinic. We will see patient back for reevaluation in 1 week here in the clinic. If anything worsens or changes patient will contact our office for additional recommendations. [name], [name] ([id number]) Electronic Signature(s) Signed: 10/17/2021 11:52:47 AM By: Worthy Keeler PA-C Entered By: Worthy Keeler on 10/17/2021 11:52:47 [name], [name] ([id number]) -------------------------------------------------------------------------------- SuperBill Details Patient Name: [name], [name] Date of Service: 10/17/2021 Medical Record Number: [id number] Patient Account Number: [id number] Date of Birth/Sex: [birth year] (80 y.o. F) Treating RN: Carlene Coria Primary Care Provider: Elsie [name] Other Clinician: Referring Provider: Elsie [name] Treating Provider/Extender: Skipper Cliche in Treatment: 6 Diagnosis Coding ICD-10 Codes Code Description I87.2 Venous insufficiency (chronic) (peripheral) L97.822 Non-pressure chronic ulcer of other part of left lower leg with fat layer exposed M79.7 Fibromyalgia M17.0 Bilateral primary osteoarthritis of knee B35.4 Tinea corporis R21 Rash and other nonspecific skin eruption Facility Procedures CPT4 Code: [id number] Description: 96759 - WOUND CARE VISIT-LEV 3 EST PT Modifier: Quantity: 1 Physician Procedures CPT4 Code: [id number] Description: 59935 - WC PHYS LEVEL 4 - EST PT Modifier: Quantity: 1 CPT4 Code: Description: ICD-10 Diagnosis Description I87.2 Venous insufficiency (chronic) (peripheral) L97.822 Non-pressure chronic ulcer of other part of left lower leg with fat  lay M79.7 Fibromyalgia M17.0 Bilateral primary osteoarthritis of knee Modifier: er exposed Quantity: Electronic Signature(s) Signed: 10/17/2021 11:53:09 AM By: Worthy Keeler PA-C Entered By: Worthy Keeler on 10/17/2021 11:53:09

## 2021-10-23 ENCOUNTER — Other Ambulatory Visit: Payer: Self-pay

## 2021-10-23 DIAGNOSIS — L97822 Non-pressure chronic ulcer of other part of left lower leg with fat layer exposed: Secondary | ICD-10-CM | POA: Diagnosis not present

## 2021-10-24 NOTE — Progress Notes (Signed)
KENITHA, GLENDINNING ([id number]) Visit Report for 10/23/2021 Physician Orders Details Patient Name: [name], [name]. Date of Service: 10/23/2021 11:15 AM Medical Record Number: [id number] Patient Account Number: [id number] Date of Birth/Sex: 07-Mar-1941 (80 y.o. F) Treating RN: Cornell Barman Primary Care Provider: [name] Other Clinician: Referring Provider: [name] Treating Provider/Extender: Skipper Cliche in Treatment: 7 Verbal / Phone Orders: No Diagnosis Coding Follow-up Appointments o Return Appointment in 1 week. - once a week provider o Nurse Visit as needed - once per week Bathing/ Shower/ Hygiene o May shower with wound dressing protected with water repellent cover or cast protector. o No tub bath. Edema Control - Lymphedema / Segmental Compressive Device / Other o Tubigrip single layer applied. - E o Elevate, Exercise Daily and Avoid Standing for Long Periods of Time. o Elevate legs to the level of the heart and pump ankles as often as possible o Elevate leg(s) parallel to the floor when sitting. Medications-Please add to medication list. o Other: - finish prednisone as prescribed Try Allegra, Claritin or Zyrtec for allergic skin reactions Wound Treatment Wound #1 - Lower Leg Wound Laterality: Left, Anterior Cleanser: Byram Ancillary Kit - 15 Day Supply (Generic) 2 x Per Week/30 Days Discharge Instructions: Use supplies as instructed; Kit contains: (15) Saline Bullets; (15) 3x3 Gauze; 15 pr Gloves Cleanser: Soap and Water 2 x Per Week/30 Days Discharge Instructions: Gently cleanse wound with antibacterial soap, rinse and pat dry prior to dressing wounds Topical: Triamcinolone Acetonide Cream, 0.1%, 15 (g) tube 2 x Per Week/30 Days Discharge Instructions: Apply as directed by provider. Primary Dressing: Endoform Antimicrobial, 2x2 (in/in) 2 x Per Week/30 Days Secondary Dressing: Zetuvit Plus Silicone Border Dressing 4x4 (in/in) (DME)  (Generic) 2 x Per Week/30 Days Compression Wrap: Tubi E 2 x Per Week/30 Days Electronic Signature(s) Signed: 10/23/2021 5:14:38 PM By: Worthy Keeler PA-C Signed: 10/24/2021 9:26:19 AM By: Gretta Cool, BSN, RN, CWS, Kim RN, BSN Entered By: Gretta Cool, BSN, RN, CWS, Kim on 10/23/2021 11:57:28 [name], [name] ([id number]) -------------------------------------------------------------------------------- [place] Details Patient Name: [name], [name] Date of Service: 10/23/2021 Medical Record Number: [id number] Patient Account Number: [id number] Date of Birth/Sex: December 23, 1940 (80 y.o. 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

## 2021-10-24 NOTE — Progress Notes (Signed)
[name], [name] ([id number]) Visit Report for 10/23/2021 Arrival Information Details Patient Name: [name], [name]. Date of Service: 10/23/2021 11:15 AM Medical Record Number: [id number] Patient Account Number: [id number] Date of Birth/Sex: 1941-11-05 (80 y.o. F) Treating RN: Cornell Barman Primary Care Babacar Haycraft: [name] Other Clinician: Referring Liya Strollo: [name] Treating Meka Lewan/Extender: Skipper Cliche in Treatment: 7 Visit Information History Since Last Visit Added or deleted any medications: No Patient Arrived: Ambulatory Has Dressing in Place as Prescribed: Yes Arrival Time: 11:56 Pain Present Now: No Accompanied By: self Transfer Assistance: None Patient Identification Verified: Yes Secondary Verification Process Completed: Yes Patient Requires Transmission-Based Precautions: No Patient Has Alerts: No Electronic Signature(s) Signed: 10/24/2021 9:26:19 AM By: Gretta Cool, BSN, RN, CWS, Kim RN, BSN Entered By: Gretta Cool, BSN, RN, CWS, Kim on 10/23/2021 11:56:41 [name], Lillette [name] ([id number]) -------------------------------------------------------------------------------- Clinic Level of Care Assessment Details Patient Name: [name], [name] Date of Service: 10/23/2021 11:15 AM Medical Record Number: [id number] Patient Account Number: [id number] Date of Birth/Sex: [birth year] (80 y.o. F) Treating RN: Cornell Barman Primary Care Pamalee Marcoe: [name] Other Clinician: Referring Alexcia Schools: [name] Treating Jiles Goya/Extender: Skipper Cliche in Treatment: 7 Clinic Level of Care Assessment Items TOOL 4 Quantity Score _0  - Use when only an EandM is performed on FOLLOW-UP visit 0 ASSESSMENTS - Nursing Assessment / Reassessment X - Reassessment of Co-morbidities (includes updates in patient status) 1 10 X- 1 5 Reassessment of Adherence to Treatment Plan ASSESSMENTS - Wound and Skin Assessment / Reassessment X - Simple Wound Assessment / Reassessment - one  wound 1 5 _1  - 0 Complex Wound Assessment / Reassessment - multiple wounds _2  - 0 Dermatologic / Skin Assessment (not related to wound area) ASSESSMENTS - Focused Assessment _3  - Circumferential Edema Measurements - multi extremities 0 _4  - 0 Nutritional Assessment / Counseling / Intervention _5  - 0 Lower Extremity Assessment (monofilament, tuning fork, pulses) _6  - 0 Peripheral Arterial Disease Assessment (using hand held doppler) ASSESSMENTS - Ostomy and/or Continence Assessment and Care _7  - Incontinence Assessment and Management 0 _8  - 0 Ostomy Care Assessment and Management (repouching, etc.) PROCESS - Coordination of Care X - Simple Patient / Family Education for ongoing care 1 15 _9  - 0 Complex (extensive) Patient / Family Education for ongoing care _10  - 0 Staff obtains Programmer, systems, Records, Test Results / Process Orders _11  - 0 Staff telephones HHA, Nursing Homes / Clarify orders / etc _12  - 0 Routine Transfer to another Facility (non-emergent condition) _13  - 0 Routine Hospital Admission (non-emergent condition) _14  - 0 New Admissions / Biomedical engineer / Ordering NPWT, Apligraf, etc. _15  - 0 Emergency Hospital Admission (emergent condition) X- 1 10 Simple Discharge Coordination _16  - 0 Complex (extensive) Discharge Coordination PROCESS - Special Needs _17  - Pediatric / Minor Patient Management 0 _18  - 0 Isolation Patient Management _19  - 0 Hearing / Language / Visual special needs _20  - 0 Assessment of Community assistance (transportation, D/C planning, etc.) _21  - 0 Additional assistance / Altered mentation _22  - 0 Support Surface(s) Assessment (bed, cushion, seat, etc.) INTERVENTIONS - Wound Cleansing / Measurement Najera, Jamilia L. ([id number]) X- 1 5 Simple Wound Cleansing - one wound _23  - 0 Complex Wound Cleansing - multiple wounds X- 1 5 Wound Imaging (photographs - any number of wounds) _24  - 0 Wound Tracing (instead of photographs) X- 1 5 Simple  Wound Measurement - one wound _25  - 0 Complex Wound Measurement - multiple wounds INTERVENTIONS - Wound Dressings _26  - Small Wound Dressing one or multiple  [name], [name] ([id number]) Visit Report for 10/23/2021 Arrival Information Details Patient Name: [name], [name]. Date of Service: 10/23/2021 11:15 AM Medical Record Number: [id number] Patient Account Number: [id number] Date of Birth/Sex: 1941-11-05 (80 y.o. F) Treating RN: Cornell Barman Primary Care Babacar Haycraft: [name] Other Clinician: Referring Liya Strollo: [name] Treating Meka Lewan/Extender: Skipper Cliche in Treatment: 7 Visit Information History Since Last Visit Added or deleted any medications: No Patient Arrived: Ambulatory Has Dressing in Place as Prescribed: Yes Arrival Time: 11:56 Pain Present Now: No Accompanied By: self Transfer Assistance: None Patient Identification Verified: Yes Secondary Verification Process Completed: Yes Patient Requires Transmission-Based Precautions: No Patient Has Alerts: No Electronic Signature(s) Signed: 10/24/2021 9:26:19 AM By: Gretta Cool, BSN, RN, CWS, Kim RN, BSN Entered By: Gretta Cool, BSN, RN, CWS, Kim on 10/23/2021 11:56:41 [name], Lillette [name] ([id number]) -------------------------------------------------------------------------------- Clinic Level of Care Assessment Details Patient Name: [name], [name] Date of Service: 10/23/2021 11:15 AM Medical Record Number: [id number] Patient Account Number: [id number] Date of Birth/Sex: [birth year] (80 y.o. F) Treating RN: Cornell Barman Primary Care Pamalee Marcoe: [name] Other Clinician: Referring Alexcia Schools: [name] Treating Jiles Goya/Extender: Skipper Cliche in Treatment: 7 Clinic Level of Care Assessment Items TOOL 4 Quantity Score _0  - Use when only an EandM is performed on FOLLOW-UP visit 0 ASSESSMENTS - Nursing Assessment / Reassessment X - Reassessment of Co-morbidities (includes updates in patient status) 1 10 X- 1 5 Reassessment of Adherence to Treatment Plan ASSESSMENTS - Wound and Skin Assessment / Reassessment X - Simple Wound Assessment / Reassessment - one  wound 1 5 _1  - 0 Complex Wound Assessment / Reassessment - multiple wounds _2  - 0 Dermatologic / Skin Assessment (not related to wound area) ASSESSMENTS - Focused Assessment _3  - Circumferential Edema Measurements - multi extremities 0 _4  - 0 Nutritional Assessment / Counseling / Intervention _5  - 0 Lower Extremity Assessment (monofilament, tuning fork, pulses) _6  - 0 Peripheral Arterial Disease Assessment (using hand held doppler) ASSESSMENTS - Ostomy and/or Continence Assessment and Care _7  - Incontinence Assessment and Management 0 _8  - 0 Ostomy Care Assessment and Management (repouching, etc.) PROCESS - Coordination of Care X - Simple Patient / Family Education for ongoing care 1 15 _9  - 0 Complex (extensive) Patient / Family Education for ongoing care _10  - 0 Staff obtains Programmer, systems, Records, Test Results / Process Orders _11  - 0 Staff telephones HHA, Nursing Homes / Clarify orders / etc _12  - 0 Routine Transfer to another Facility (non-emergent condition) _13  - 0 Routine Hospital Admission (non-emergent condition) _14  - 0 New Admissions / Biomedical engineer / Ordering NPWT, Apligraf, etc. _15  - 0 Emergency Hospital Admission (emergent condition) X- 1 10 Simple Discharge Coordination _16  - 0 Complex (extensive) Discharge Coordination PROCESS - Special Needs _17  - Pediatric / Minor Patient Management 0 _18  - 0 Isolation Patient Management _19  - 0 Hearing / Language / Visual special needs _20  - 0 Assessment of Community assistance (transportation, D/C planning, etc.) _21  - 0 Additional assistance / Altered mentation _22  - 0 Support Surface(s) Assessment (bed, cushion, seat, etc.) INTERVENTIONS - Wound Cleansing / Measurement Najera, Jamilia L. ([id number]) X- 1 5 Simple Wound Cleansing - one wound _23  - 0 Complex Wound Cleansing - multiple wounds X- 1 5 Wound Imaging (photographs - any number of wounds) _24  - 0 Wound Tracing (instead of photographs) X- 1 5 Simple  Wound Measurement - one wound _25  - 0 Complex Wound Measurement - multiple wounds INTERVENTIONS - Wound Dressings _26  - Small Wound Dressing one or multiple  wounds 0 X- 1 15 Medium Wound Dressing one or multiple wounds _0  - 0 Large Wound Dressing one or multiple wounds <GGYIRSWNIOEVOJJK>_0<\/XFGHWEXHBZJIRCVE>_9  - 0 Application of Medications - topical <FYBOFBPZWCHENIDP>_8<\/EUMPNTIRWERXVQMG>_8  - 0 Application of Medications - injection INTERVENTIONS - Miscellaneous _3  - External ear exam 0 _4  - 0 Specimen Collection (cultures, biopsies, blood, body fluids, etc.) _5  - 0 Specimen(s) / Culture(s) sent or taken to Lab for analysis _6  - 0 Patient Transfer (multiple staff / Civil Service fast streamer / Similar devices) _7  - 0 Simple Staple / Suture removal (25 or less) _8  - 0 Complex Staple / Suture removal (26 or more) _9  - 0 Hypo / Hyperglycemic Management (close monitor of Blood Glucose) _10  - 0 Ankle / Brachial Index (ABI) - do not check if billed separately X- 1 5 Vital Signs Has the patient been seen at the hospital within the last three years: Yes Total Score: 80 Level Of Care: New/Established - Level 3 Electronic Signature(s) Signed: 10/24/2021 9:26:19 AM By: Gretta Cool, BSN, RN, CWS, Kim RN, BSN Entered By: Gretta Cool, BSN, RN, CWS, Kim on 10/23/2021 11:58:19 Ruehl, Lillette [name] ([id number]) -------------------------------------------------------------------------------- Encounter Discharge Information Details Patient Name: Currier, Nyeli L. Date of Service: 10/23/2021 11:15 AM Medical Record Number: [id number] Patient Account Number: [id number] Date of Birth/Sex: December 16, 1940 (80 y.o. F) Treating RN: Cornell Barman Primary Care Prarthana Parlin: [name] Other Clinician: Referring Mackena Plummer: [name] Treating Onica Davidovich/Extender: Skipper Cliche in Treatment: 7 Encounter Discharge Information Items Discharge Condition: Stable Ambulatory Status: Walker Discharge Destination: Home Transportation: Private Auto Schedule Follow-up Appointment: Yes Clinical Summary of  Care: Electronic Signature(s) Signed: 10/24/2021 9:26:19 AM By: Gretta Cool, BSN, RN, CWS, Kim RN, BSN Entered By: Gretta Cool, BSN, RN, CWS, Kim on 10/23/2021 11:57:57 Gervacio, Lillette [name] ([id number]) -------------------------------------------------------------------------------- Wound Assessment Details Patient Name: Pete, Page L. Date of Service: 10/23/2021 11:15 AM Medical Record Number: [id number] Patient Account Number: [id number] Date of Birth/Sex: 1941/05/13 (80 y.o. F) Treating RN: Cornell Barman Primary Care Tyrion Glaude: [name] Other Clinician: Referring Maridee Slape: [name] Treating Syana Degraffenreid/Extender: Jeri Cos Weeks in Treatment: 7 Wound Status Wound Number: 1 Primary Etiology: Venous Leg Ulcer Wound Location: Left, Anterior Lower Leg Wound Status: Open Wounding Event: Skin Tear/Laceration Comorbid History: Cataracts, Osteoarthritis, Received Radiation Date Acquired: 07/30/2021 Weeks Of Treatment: 7 Clustered Wound: No Wound Measurements Length: (cm) 0.2 Width: (cm) 0.6 Depth: (cm) 0.5 Area: (cm) 0.094 Volume: (cm) 0.047 % Reduction in Area: 92.5% % Reduction in Volume: 92.5% Epithelialization: Small (1-33%) Wound Description Classification: Full Thickness Without Exposed Support Structures Wound Margin: Flat and Intact Exudate Amount: Medium Exudate Type: Serosanguineous Exudate Color: red, brown Foul Odor After Cleansing: No Slough/Fibrino Yes Wound Bed Granulation Amount: Medium (34-66%) Exposed Structure Granulation Quality: Red Fascia Exposed: No Necrotic Amount: Medium (34-66%) Fat Layer (Subcutaneous Tissue) Exposed: Yes Necrotic Quality: Adherent Slough Tendon Exposed: No Muscle Exposed: No Joint Exposed: No Bone Exposed: No Treatment Notes Wound #1 (Lower Leg) Wound Laterality: Left, Anterior Cleanser Byram Ancillary Kit - 15 Day Supply Discharge Instruction: Use supplies as instructed; Kit contains: (15) Saline Bullets; (15) 3x3 Gauze;  15 pr Gloves Soap and Water Discharge Instruction: Gently cleanse wound with antibacterial soap, rinse and pat dry prior to dressing wounds Peri-Wound Care Topical Triamcinolone Acetonide Cream, 0.1%, 15 (g) tube Discharge Instruction: Apply as directed by Bernell Sigal. Primary Dressing Endoform Antimicrobial, 2x2 (in/in) Secondary Dressing Zetuvit Plus Silicone Border Dressing 4x4 (in/in) Secured With Compression ROZALYNN, BUEGE ([id number]) Tubi E Compression Stockings Add-Ons Electronic Signature(s) Signed: 10/24/2021 9:26:19 AM By: Gretta Cool, BSN, RN,

## 2021-10-25 ENCOUNTER — Other Ambulatory Visit: Payer: Self-pay

## 2021-10-25 ENCOUNTER — Encounter (HOSPITAL_BASED_OUTPATIENT_CLINIC_OR_DEPARTMENT_OTHER): Payer: PPO

## 2021-10-25 DIAGNOSIS — I872 Venous insufficiency (chronic) (peripheral): Secondary | ICD-10-CM | POA: Diagnosis not present

## 2021-10-25 DIAGNOSIS — L97822 Non-pressure chronic ulcer of other part of left lower leg with fat layer exposed: Secondary | ICD-10-CM

## 2021-10-25 NOTE — Progress Notes (Signed)
DESHONNA, TRNKA ([id number]) Visit Report for 10/25/2021 Physician Orders Details Patient Name: [name], [name]. Date of Service: 10/25/2021 11:15 AM Medical Record Number: [id number] Patient Account Number: [id number] Date of Birth/Sex: [birth year] (80 y.o. F) Treating RN: Donnamarie Poag Primary Care Provider: [name] Other Clinician: Referring Provider: [name] Treating Provider/Extender: Yaakov Guthrie in Treatment: 7 Verbal / Phone Orders: No Diagnosis Coding Follow-up Appointments o Return Appointment in 1 week. - once a week provider o Nurse Visit as needed - once per week Bathing/ Shower/ Hygiene o May shower with wound dressing protected with water repellent cover or cast protector. o No tub bath. Edema Control - Lymphedema / Segmental Compressive Device / Other o Tubigrip single layer applied. - E o Elevate, Exercise Daily and Avoid Standing for Long Periods of Time. o Elevate legs to the level of the heart and pump ankles as often as possible o Elevate leg(s) parallel to the floor when sitting. Medications-Please add to medication list. o Other: - finish prednisone as prescribed Try Allegra, Claritin or Zyrtec for allergic skin reactions Wound Treatment Wound #1 - Lower Leg Wound Laterality: Left, Anterior Cleanser: Byram Ancillary Kit - 15 Day Supply (Generic) 2 x Per Week/30 Days Discharge Instructions: Use supplies as instructed; Kit contains: (15) Saline Bullets; (15) 3x3 Gauze; 15 pr Gloves Cleanser: Soap and Water 2 x Per Week/30 Days Discharge Instructions: Gently cleanse wound with antibacterial soap, rinse and pat dry prior to dressing wounds Topical: Triamcinolone Acetonide Cream, 0.1%, 15 (g) tube 2 x Per Week/30 Days Discharge Instructions: Apply as directed by provider. Primary Dressing: Endoform Antimicrobial, 2x2 (in/in) 2 x Per Week/30 Days Secondary Dressing: Zetuvit Plus Silicone Border Dressing 4x4 (in/in)  (Generic) 2 x Per Week/30 Days Compression Wrap: Tubi E 2 x Per Week/30 Days Electronic Signature(s) Signed: 10/25/2021 1:47:45 PM By: Kalman Shan DO Signed: 10/25/2021 2:54:54 PM By: Donnamarie Poag Entered By: Donnamarie Poag on 10/25/2021 11:41:10 [place], [place] ([id number]) -------------------------------------------------------------------------------- [place] Details Patient Name: [name], [name] Date of Service: 10/25/2021 Medical Record Number: [id number] Patient Account Number: [id number] Date of Birth/Sex: [birth year] (80 y.o. 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

## 2021-10-25 NOTE — Progress Notes (Signed)
PADME, [name] ([id number]) Visit Report for 10/25/2021 Arrival Information Details Patient Name: [name], [name]. Date of Service: 10/25/2021 11:15 AM Medical Record Number: [id number] Patient Account Number: [id number] Date of Birth/Sex: 1941/09/18 (80 y.o. F) Treating RN: Donnamarie Poag Primary Care : [name] Other Clinician: Referring : [name] Treating /Extender: Yaakov Guthrie in Treatment: 7 Visit Information History Since Last Visit Added or deleted any medications: No Patient Arrived: [name] Had a fall or experienced change in No Arrival Time: 11:33 activities of daily living that may affect Accompanied By: self risk of falls: Transfer Assistance: None Hospitalized since last visit: No Patient Identification Verified: Yes Has Dressing in Place as Prescribed: Yes Secondary Verification Process Completed: Yes Pain Present Now: No Patient Requires Transmission-Based Precautions: No Patient Has Alerts: No Electronic Signature(s) Signed: 10/25/2021 2:54:54 PM By: Donnamarie Poag Entered By: Donnamarie Poag on 10/25/2021 11:33:56 Hinsch, [name] ([id number]) -------------------------------------------------------------------------------- Clinic Level of Care Assessment Details Patient Name: [name], [name] Date of Service: 10/25/2021 11:15 AM Medical Record Number: [id number] Patient Account Number: [id number] Date of Birth/Sex: [birth year] (80 y.o. F) Treating RN: Donnamarie Poag Primary Care : [name] Other Clinician: Referring : [name] Treating /Extender: Yaakov Guthrie in Treatment: 7 Clinic Level of Care Assessment Items TOOL 4 Quantity Score [] - Use when only an EandM is performed on FOLLOW-UP visit 0 ASSESSMENTS - Nursing Assessment / Reassessment [] - Reassessment of Co-morbidities (includes updates in patient status) 0 [] - 0 Reassessment of Adherence to Treatment  Plan ASSESSMENTS - Wound and Skin Assessment / Reassessment X - Simple Wound Assessment / Reassessment - one wound 1 5 [] - 0 Complex Wound Assessment / Reassessment - multiple wounds [] - 0 Dermatologic / Skin Assessment (not related to wound area) ASSESSMENTS - Focused Assessment [] - Circumferential Edema Measurements - multi extremities 0 [] - 0 Nutritional Assessment / Counseling / Intervention [] - 0 Lower Extremity Assessment (monofilament, tuning fork, pulses) [] - 0 Peripheral Arterial Disease Assessment (using hand held doppler) ASSESSMENTS - Ostomy and/or Continence Assessment and Care [] - Incontinence Assessment and Management 0 [] - 0 Ostomy Care Assessment and Management (repouching, etc.) PROCESS - Coordination of Care X - Simple Patient / Family Education for ongoing care 1 15 [] - 0 Complex (extensive) Patient / Family Education for ongoing care [] - 0 Staff obtains Programmer, systems, Records, Test Results / Process Orders [] - 0 Staff telephones HHA, Nursing Homes / Clarify orders / etc [] - 0 Routine Transfer to another Facility (non-emergent condition) [] - 0 Routine Hospital Admission (non-emergent condition) [] - 0 New Admissions / Biomedical engineer / Ordering NPWT, Apligraf, etc. [] - 0 Emergency Hospital Admission (emergent condition) X- 1 10 Simple Discharge Coordination [] - 0 Complex (extensive) Discharge Coordination PROCESS - Special Needs [] - Pediatric / Minor Patient Management 0 [] - 0 Isolation Patient Management [] - 0 Hearing / Language / Visual special needs [] - 0 Assessment of Community assistance (transportation, D/C planning, etc.) [] - 0 Additional assistance / Altered mentation [] - 0 Support Surface(s) Assessment (bed, cushion, seat, etc.) INTERVENTIONS - Wound Cleansing / Measurement Vandegrift, Jazzmen L. ([id number]) X- 1 5 Simple Wound Cleansing - one wound [] - 0 Complex Wound Cleansing - multiple wounds X- 1  5 Wound Imaging (photographs - any number of wounds) [] - 0 Wound Tracing (instead of photographs) X- 1 5 Simple Wound Measurement - one wound [] - 0 Complex Wound Measurement -  SAMARRA, RIDGELY ([id number]) Visit Report for 10/25/2021 Arrival Information Details Patient Name: [name]. Date of Service: 10/25/2021 11:15 AM Medical Record Number: [id number] Patient Account Number: [id number] Date of Birth/Sex: [birth year] (80 y.o. F) Treating RN: Donnamarie Poag Primary Care : [name] Other Clinician: Referring : [name] Treating /Extender: Yaakov Guthrie in Treatment: 7 Visit Information History Since Last Visit Added or deleted any medications: No Patient Arrived: [name] Had a fall or experienced change in No Arrival Time: 11:33 activities of daily living that may affect Accompanied By: self risk of falls: Transfer Assistance: None Hospitalized since last visit: No Patient Identification Verified: Yes Has Dressing in Place as Prescribed: Yes Secondary Verification Process Completed: Yes Pain Present Now: No Patient Requires Transmission-Based Precautions: No Patient Has Alerts: No Electronic Signature(s) Signed: 10/25/2021 2:54:54 PM By: Donnamarie Poag Entered By: Donnamarie Poag on 10/25/2021 11:33:56 Steinhart, [name] ([id number]) -------------------------------------------------------------------------------- Clinic Level of Care Assessment Details Patient Name: [name], [name] Date of Service: 10/25/2021 11:15 AM Medical Record Number: [id number] Patient Account Number: [id number] Date of Birth/Sex: 13-Jan-1941 (80 y.o. F) Treating RN: Donnamarie Poag Primary Care : [name] Other Clinician: Referring : [name] Treating /Extender: Yaakov Guthrie in Treatment: 7 Clinic Level of Care Assessment Items TOOL 4 Quantity Score [] - Use when only an EandM is performed on FOLLOW-UP visit 0 ASSESSMENTS - Nursing Assessment / Reassessment [] - Reassessment of Co-morbidities (includes updates in patient status) 0 [] - 0 Reassessment of Adherence to Treatment  Plan ASSESSMENTS - Wound and Skin Assessment / Reassessment X - Simple Wound Assessment / Reassessment - one wound 1 5 [] - 0 Complex Wound Assessment / Reassessment - multiple wounds [] - 0 Dermatologic / Skin Assessment (not related to wound area) ASSESSMENTS - Focused Assessment [] - Circumferential Edema Measurements - multi extremities 0 [] - 0 Nutritional Assessment / Counseling / Intervention [] - 0 Lower Extremity Assessment (monofilament, tuning fork, pulses) [] - 0 Peripheral Arterial Disease Assessment (using hand held doppler) ASSESSMENTS - Ostomy and/or Continence Assessment and Care [] - Incontinence Assessment and Management 0 [] - 0 Ostomy Care Assessment and Management (repouching, etc.) PROCESS - Coordination of Care X - Simple Patient / Family Education for ongoing care 1 15 [] - 0 Complex (extensive) Patient / Family Education for ongoing care [] - 0 Staff obtains Programmer, systems, Records, Test Results / Process Orders [] - 0 Staff telephones HHA, Nursing Homes / Clarify orders / etc [] - 0 Routine Transfer to another Facility (non-emergent condition) [] - 0 Routine Hospital Admission (non-emergent condition) [] - 0 New Admissions / Biomedical engineer / Ordering NPWT, Apligraf, etc. [] - 0 Emergency Hospital Admission (emergent condition) X- 1 10 Simple Discharge Coordination [] - 0 Complex (extensive) Discharge Coordination PROCESS - Special Needs [] - Pediatric / Minor Patient Management 0 [] - 0 Isolation Patient Management [] - 0 Hearing / Language / Visual special needs [] - 0 Assessment of Community assistance (transportation, D/C planning, etc.) [] - 0 Additional assistance / Altered mentation [] - 0 Support Surface(s) Assessment (bed, cushion, seat, etc.) INTERVENTIONS - Wound Cleansing / Measurement Benavides, Mckynlee L. ([id number]) X- 1 5 Simple Wound Cleansing - one wound [] - 0 Complex Wound Cleansing - multiple wounds X- 1  5 Wound Imaging (photographs - any number of wounds) [] - 0 Wound Tracing (instead of photographs) X- 1 5 Simple Wound Measurement - one wound [] - 0 Complex Wound Measurement -  SAMARRA, RIDGELY ([id number]) Visit Report for 10/25/2021 Arrival Information Details Patient Name: [name]. Date of Service: 10/25/2021 11:15 AM Medical Record Number: [id number] Patient Account Number: [id number] Date of Birth/Sex: [birth year] (80 y.o. F) Treating RN: Donnamarie Poag Primary Care : [name] Other Clinician: Referring : [name] Treating /Extender: Yaakov Guthrie in Treatment: 7 Visit Information History Since Last Visit Added or deleted any medications: No Patient Arrived: [name] Had a fall or experienced change in No Arrival Time: 11:33 activities of daily living that may affect Accompanied By: self risk of falls: Transfer Assistance: None Hospitalized since last visit: No Patient Identification Verified: Yes Has Dressing in Place as Prescribed: Yes Secondary Verification Process Completed: Yes Pain Present Now: No Patient Requires Transmission-Based Precautions: No Patient Has Alerts: No Electronic Signature(s) Signed: 10/25/2021 2:54:54 PM By: Donnamarie Poag Entered By: Donnamarie Poag on 10/25/2021 11:33:56 Steinhart, [name] ([id number]) -------------------------------------------------------------------------------- Clinic Level of Care Assessment Details Patient Name: [name], [name] Date of Service: 10/25/2021 11:15 AM Medical Record Number: [id number] Patient Account Number: [id number] Date of Birth/Sex: 13-Jan-1941 (80 y.o. F) Treating RN: Donnamarie Poag Primary Care : [name] Other Clinician: Referring : [name] Treating /Extender: Yaakov Guthrie in Treatment: 7 Clinic Level of Care Assessment Items TOOL 4 Quantity Score [] - Use when only an EandM is performed on FOLLOW-UP visit 0 ASSESSMENTS - Nursing Assessment / Reassessment [] - Reassessment of Co-morbidities (includes updates in patient status) 0 [] - 0 Reassessment of Adherence to Treatment  Plan ASSESSMENTS - Wound and Skin Assessment / Reassessment X - Simple Wound Assessment / Reassessment - one wound 1 5 [] - 0 Complex Wound Assessment / Reassessment - multiple wounds [] - 0 Dermatologic / Skin Assessment (not related to wound area) ASSESSMENTS - Focused Assessment [] - Circumferential Edema Measurements - multi extremities 0 [] - 0 Nutritional Assessment / Counseling / Intervention [] - 0 Lower Extremity Assessment (monofilament, tuning fork, pulses) [] - 0 Peripheral Arterial Disease Assessment (using hand held doppler) ASSESSMENTS - Ostomy and/or Continence Assessment and Care [] - Incontinence Assessment and Management 0 [] - 0 Ostomy Care Assessment and Management (repouching, etc.) PROCESS - Coordination of Care X - Simple Patient / Family Education for ongoing care 1 15 [] - 0 Complex (extensive) Patient / Family Education for ongoing care [] - 0 Staff obtains Programmer, systems, Records, Test Results / Process Orders [] - 0 Staff telephones HHA, Nursing Homes / Clarify orders / etc [] - 0 Routine Transfer to another Facility (non-emergent condition) [] - 0 Routine Hospital Admission (non-emergent condition) [] - 0 New Admissions / Biomedical engineer / Ordering NPWT, Apligraf, etc. [] - 0 Emergency Hospital Admission (emergent condition) X- 1 10 Simple Discharge Coordination [] - 0 Complex (extensive) Discharge Coordination PROCESS - Special Needs [] - Pediatric / Minor Patient Management 0 [] - 0 Isolation Patient Management [] - 0 Hearing / Language / Visual special needs [] - 0 Assessment of Community assistance (transportation, D/C planning, etc.) [] - 0 Additional assistance / Altered mentation [] - 0 Support Surface(s) Assessment (bed, cushion, seat, etc.) INTERVENTIONS - Wound Cleansing / Measurement Benavides, Mckynlee L. ([id number]) X- 1 5 Simple Wound Cleansing - one wound [] - 0 Complex Wound Cleansing - multiple wounds X- 1  5 Wound Imaging (photographs - any number of wounds) [] - 0 Wound Tracing (instead of photographs) X- 1 5 Simple Wound Measurement - one wound [] - 0 Complex Wound Measurement -

## 2021-10-27 ENCOUNTER — Encounter: Payer: PPO | Attending: Physician Assistant | Admitting: Physician Assistant

## 2021-10-27 ENCOUNTER — Other Ambulatory Visit: Payer: Self-pay

## 2021-10-27 DIAGNOSIS — B354 Tinea corporis: Secondary | ICD-10-CM | POA: Diagnosis not present

## 2021-10-27 DIAGNOSIS — I872 Venous insufficiency (chronic) (peripheral): Secondary | ICD-10-CM | POA: Diagnosis not present

## 2021-10-27 DIAGNOSIS — L97822 Non-pressure chronic ulcer of other part of left lower leg with fat layer exposed: Secondary | ICD-10-CM | POA: Diagnosis not present

## 2021-10-27 NOTE — Progress Notes (Addendum)
[name], [name] ([id number]) Visit Report for 10/27/2021 Chief Complaint Document Details Patient Name: [name], [name] Date of Service: 10/27/2021 11:00 AM Medical Record Number: [id number] Patient Account Number: [id number] Date of Birth/Sex: 1940/12/11 (80 y.o. F) Treating RN: Carlene Coria Primary Care Provider: [name] Other Clinician: Referring Provider: [name] Treating Provider/Extender: Skipper Cliche in Treatment: 8 Information Obtained from: Patient Chief Complaint Left LE Ulcer Electronic Signature(s) Signed: 10/27/2021 12:58:49 PM By: Worthy Keeler PA-C Entered By: Worthy Keeler on 10/27/2021 12:58:49 [name] ([id number]) -------------------------------------------------------------------------------- HPI Details Patient Name: [name], [name] Date of Service: 10/27/2021 11:00 AM Medical Record Number: [id number] Patient Account Number: [id number] Date of Birth/Sex: [birth year] (80 y.o. F) Treating RN: Carlene Coria Primary Care Provider: [name] Other Clinician: Referring Provider: [name] Treating Provider/Extender: Skipper Cliche in Treatment: 8 History of Present Illness HPI Description: 08/31/2021 upon evaluation today patient presents for initial inspection here in our clinic concerning issues that she is having with a wound on her left anterior lower extremity. This is actually an area that she unfortunately tells me she was picking up a cage and dropped hitting the anterior portion of her shin causing a skin tear. She did not go to the hospital due to the fact that she did not realize that she would need to. This happened on July 30, 2021. With that being said the patient has been on Augmentin she has about 1 and half days of this left. Nonetheless she tells me that she does have a longstanding history of leg swelling. She does have chronic venous insufficiency as well it appears to me. She also has some  evidence of may be early stages of lymphedema. She has a history of fibromyalgia, bilateral knee osteoarthritis, and appears to have a fungal infection of the right ankle region. In regard to the arthritis she tells me her legs tend to swell less after she gets her knee injections allowing her to be able to walk and get around much more effectively. Following the injections she does well for a while and then when that seems to be wearing off she tends to start having issues with more swelling because she is more mobile that makes sense from a venous standpoint. 10/10; I was asked to see this patient today who came in for a nurse visit. Firstly she has developed a widespread very pruritic rash involving her abdomen and back. Lesser extent on her feet. The second problem is that she is complaining about the tightness of the wound and wrap on the left leg where the wound is. 09/14/2021 upon evaluation today patient appears to be doing decently well in regard to her leg ulcer. The biggest issue she is having is a significant issue with a rash currently. I do not see any signs of active infection systemically which is great news although with regard to the rash she did see dermatology they told her she had eczema that does not seem to sit right with me out think that is mainly the issues she has going on. I do think she may benefit from the use of a short course of prednisone which should help with the rash which is absolutely driving her crazy. 09/21/2021 upon evaluation today patient appears to be doing decently well in regard to her wound this is still pretty deep. The prednisone has helped with the rash although is not completely done and it is doing much better than what it was. Fortunately there is  no signs of active infection systemically at this time also great news. 09/28/2021 upon evaluation today patient's wound on her leg actually showing some signs of improvement which is good news I am happy  in that regard. Fortunately there does not appear to be any evidence of active infection systemically which is great news. Unfortunately she is still continuing to have issues with her rash which is almost worse than the actual wound itself for her at this point. 10/05/2021 upon evaluation today patient appears to be doing well with regard to her leg ulcer. She is tolerating the dressing changes without complication and overall I am extremely pleased with where we stand. With that being said I do think that she could possibly benefit from a snap VAC if we can gain approval I am not certain if this will be improved or not. Nonetheless I definitely think we can give this a try and if we get approval I think this will help speed up the healing process to be honest. In the interim I do believe the [facility] is doing decently well. 10/10/2021 upon evaluation today patient appears to be doing well with regard to her wound although is not significantly smaller. We still have quite a bit of depth here. Fortunately there is no signs of active infection systemically at this time. No fevers, chills, nausea, vomiting, or diarrhea. 10/17/2021 upon evaluation today patient appears to be doing poorly in regard to her leg compared to last time there is a lot of irritation today. She actually kept the Zetuvit on all week which does not seem to have been beneficial for her. She was supposed to change it at home we did order her supplies but she did not even find the supplies on her porch until she tells me yesterday. Either way we had already decided that the best thing was probably can to be to have her come in for nurse visits. I think that still probably the best thing to do I feel like there is a bit of dementia here that I was not aware of last week or even the previous visit to be honest that is going to prohibit her from being able to make these dressing changes herself. 10/27/2021 upon evaluation today  patient appears to be doing well with regard to her wound currently. She is actually showing signs of excellent improvement and I do feel like the collagen is doing a great job. I am extremely pleased with where we stand today. Electronic Signature(s) Signed: 10/27/2021 1:16:11 PM By: Worthy Keeler PA-C Entered By: Worthy Keeler on 10/27/2021 13:16:10 [place], Lillette [name] ([id number]) -------------------------------------------------------------------------------- Physical Exam Details Patient Name: Nack, [name] Date of Service: 10/27/2021 11:00 AM Medical Record Number: [id number] Patient Account Number: [id number] Date of Birth/Sex: [birth year] (80 y.o. F) Treating RN: Carlene Coria Primary Care Provider: [name] Other Clinician: Referring Provider: [name] Treating Provider/Extender: Skipper Cliche in Treatment: 8 Constitutional Well-nourished and well-hydrated in no acute distress. Respiratory normal breathing without difficulty. Psychiatric this patient is able to make decisions and demonstrates good insight into disease process. Alert and Oriented x 3. pleasant and cooperative. Notes Upon inspection patient's wound bed showed excellent granulation epithelization there was some slough noted I just used a Q-tip and gauze with saline to try to clean out the surface of the wound there is slough was able to be removed this way without complication and post mechanical debridement patient's wound seems to be doing much better. Electronic Signature(s)  Signed: 10/27/2021 1:16:34 PM By: Worthy Keeler PA-C Entered By: Worthy Keeler on 10/27/2021 13:16:34 [place], Lillette [name] ([id number]) -------------------------------------------------------------------------------- Physician Orders Details Patient Name: [place], [name] Date of Service: 10/27/2021 11:00 AM Medical Record Number: [id number] Patient Account Number: [id number] Date of Birth/Sex: 19-Apr-1941 (80  y.o. F) Treating RN: Carlene Coria Primary Care Provider: [name] Other Clinician: Referring Provider: [name] Treating Provider/Extender: Skipper Cliche in Treatment: 8 Verbal / Phone Orders: No Diagnosis Coding ICD-10 Coding Code Description I87.2 Venous insufficiency (chronic) (peripheral) L97.822 Non-pressure chronic ulcer of other part of left lower leg with fat layer exposed M79.7 Fibromyalgia M17.0 Bilateral primary osteoarthritis of knee B35.4 Tinea corporis R21 Rash and other nonspecific skin eruption Follow-up Appointments o Return Appointment in 1 week. - once a week provider o Nurse Visit as needed - once per week Bathing/ Shower/ Hygiene o May shower with wound dressing protected with water repellent cover or cast protector. o No tub bath. Anesthetic (Use 'Patient Medications' Section for Anesthetic Order Entry) o Lidocaine applied to wound bed Edema Control - Lymphedema / Segmental Compressive Device / Other o Tubigrip single layer applied. - E o Elevate, Exercise Daily and Avoid Standing for Long Periods of Time. o Elevate legs to the level of the heart and pump ankles as often as possible o Elevate leg(s) parallel to the floor when sitting. Medications-Please add to medication list. o Other: - finish prednisone as prescribed Try Allegra, Claritin or Zyrtec for allergic skin reactions Wound Treatment Wound #1 - Lower Leg Wound Laterality: Left, Anterior Cleanser: Byram Ancillary Kit - 15 Day Supply (Generic) 2 x Per Week/30 Days Discharge Instructions: Use supplies as instructed; Kit contains: (15) Saline Bullets; (15) 3x3 Gauze; 15 pr Gloves Cleanser: Soap and Water 2 x Per Week/30 Days Discharge Instructions: Gently cleanse wound with antibacterial soap, rinse and pat dry prior to dressing wounds Topical: Triamcinolone Acetonide Cream, 0.1%, 15 (g) tube 2 x Per Week/30 Days Discharge Instructions: Apply as directed by  provider. Primary Dressing: Endoform Antimicrobial, 2x2 (in/in) 2 x Per Week/30 Days Secondary Dressing: Zetuvit Plus Silicone Border Dressing 4x4 (in/in) (Generic) 2 x Per Week/30 Days Compression Wrap: Tubi E 2 x Per Week/30 Days Patient Medications Allergies: sulfur, Macrodantin Notifications Medication Indication Start End [place], [name] ([id number]) Notifications Medication Indication Start End LMX 4 DOSE topical 4 % cream - cream topical Electronic Signature(s) Signed: 10/27/2021 4:25:56 PM By: Carlene Coria RN Signed: 10/27/2021 5:38:58 PM By: Worthy Keeler PA-C Entered By: Carlene Coria on 10/27/2021 11:55:22 [place], Lillette [name] ([id number]) -------------------------------------------------------------------------------- Prescription 10/27/2021 Patient Name: Rafferty, Kinsly L. Provider: Jeri Cos PA-C Date of Birth: 1941/06/07 NPI#: [id number] Sex: F DEA#: [id number] Phone #: [phone] License #: Patient Address: [facility] [place], [place] [address] [address] [place], [place] [address] [phone] Allergies sulfur; Macrodantin Medication Medication: Route: Strength: Form: LMX 4 topical 4% cream Class: TOPICAL LOCAL ANESTHETICS Dose: Frequency / Time: Indication: cream topical Number of Refills: Number of Units: 0 Generic Substitution: Start Date: End Date: Administered at Facility: Substitution Permitted Yes Time Administered: Time Discontinued: Note to Pharmacy: Hand Signature: Date(s): Electronic Signature(s) Signed: 10/27/2021 4:25:56 PM By: Carlene Coria RN Signed: 10/27/2021 5:38:58 PM By: Worthy Keeler PA-C Entered By: Carlene Coria on 10/27/2021 11:55:22 [place], Lillette [name] ([id number]) --------------------------------------------------------------------------------  Problem List Details Patient Name: Tiedt, Meaghann L. Date of Service:  10/27/2021 11:00 AM Medical Record Number: [id number] Patient  Account Number: [id number] Date of Birth/Sex: 11-14-1941 (80 y.o. F) Treating RN: Carlene Coria Primary Care Provider: [name] Other Clinician: Referring Provider: [name] Treating Provider/Extender: Skipper Cliche in Treatment: 8 Active Problems ICD-10 Encounter Code Description Active Date MDM Diagnosis I87.2 Venous insufficiency (chronic) (peripheral) 08/31/2021 No Yes L97.822 Non-pressure chronic ulcer of other part of left lower leg with fat layer 08/31/2021 No Yes exposed M79.7 Fibromyalgia 08/31/2021 No Yes M17.0 Bilateral primary osteoarthritis of knee 08/31/2021 No Yes B35.4 Tinea corporis 08/31/2021 No Yes R21 Rash and other nonspecific skin eruption 09/04/2021 No Yes Inactive Problems Resolved Problems Electronic Signature(s) Signed: 10/27/2021 11:17:32 AM By: Worthy Keeler PA-C Entered By: Worthy Keeler on 10/27/2021 11:17:32 [place], Lillette [name] ([id number]) -------------------------------------------------------------------------------- Progress Note Details Patient Name: [place]ette, [name] Date of Service: 10/27/2021 11:00 AM Medical Record Number: [id number] Patient Account Number: [id number] Date of Birth/Sex: 1941-10-24 (80 y.o. F) Treating RN: Carlene Coria Primary Care Provider: [name] Other Clinician: Referring Provider: [name] Treating Provider/Extender: Skipper Cliche in Treatment: 8 Subjective Chief Complaint Information obtained from Patient Left LE Ulcer History of Present Illness (HPI) 08/31/2021 upon evaluation today patient presents for initial inspection here in our clinic concerning issues that she is having with a wound on her left anterior lower extremity. This is actually an area that she unfortunately tells me she was picking up a cage and dropped hitting the anterior portion of her shin causing a skin tear. She did not go to the hospital due to the  fact that she did not realize that she would need to. This happened on July 30, 2021. With that being said the patient has been on Augmentin she has about 1 and half days of this left. Nonetheless she tells me that she does have a longstanding history of leg swelling. She does have chronic venous insufficiency as well it appears to me. She also has some evidence of may be early stages of lymphedema. She has a history of fibromyalgia, bilateral knee osteoarthritis, and appears to have a fungal infection of the right ankle region. In regard to the arthritis she tells me her legs tend to swell less after she gets her knee injections allowing her to be able to walk and get around much more effectively. Following the injections she does well for a while and then when that seems to be wearing off she tends to start having issues with more swelling because she is more mobile that makes sense from a venous standpoint. 10/10; I was asked to see this patient today who came in for a nurse visit. Firstly she has developed a widespread very pruritic rash involving her abdomen and back. Lesser extent on her feet. The second problem is that she is complaining about the tightness of the wound and wrap on the left leg where the wound is. 09/14/2021 upon evaluation today patient appears to be doing decently well in regard to her leg ulcer. The biggest issue she is having is a significant issue with a rash currently. I do not see any signs of active infection systemically which is great news although with regard to the rash she did see dermatology they told her she had eczema that does not seem to sit right with me out think that is mainly the issues she has going on. I do think she may benefit from the use of a short course of prednisone which should help with the rash which is absolutely driving her crazy. 09/21/2021 upon evaluation  today patient appears to be doing decently well in regard to her wound this is  still pretty deep. The prednisone has helped with the rash although is not completely done and it is doing much better than what it was. Fortunately there is no signs of active infection systemically at this time also great news. 09/28/2021 upon evaluation today patient's wound on her leg actually showing some signs of improvement which is good news I am happy in that regard. Fortunately there does not appear to be any evidence of active infection systemically which is great news. Unfortunately she is still continuing to have issues with her rash which is almost worse than the actual wound itself for her at this point. 10/05/2021 upon evaluation today patient appears to be doing well with regard to her leg ulcer. She is tolerating the dressing changes without complication and overall I am extremely pleased with where we stand. With that being said I do think that she could possibly benefit from a snap VAC if we can gain approval I am not certain if this will be improved or not. Nonetheless I definitely think we can give this a try and if we get approval I think this will help speed up the healing process to be honest. In the interim I do believe the [facility] is doing decently well. 10/10/2021 upon evaluation today patient appears to be doing well with regard to her wound although is not significantly smaller. We still have quite a bit of depth here. Fortunately there is no signs of active infection systemically at this time. No fevers, chills, nausea, vomiting, or diarrhea. 10/17/2021 upon evaluation today patient appears to be doing poorly in regard to her leg compared to last time there is a lot of irritation today. She actually kept the Zetuvit on all week which does not seem to have been beneficial for her. She was supposed to change it at home we did order her supplies but she did not even find the supplies on her porch until she tells me yesterday. Either way we had already decided that  the best thing was probably can to be to have her come in for nurse visits. I think that still probably the best thing to do I feel like there is a bit of dementia here that I was not aware of last week or even the previous visit to be honest that is going to prohibit her from being able to make these dressing changes herself. 10/27/2021 upon evaluation today patient appears to be doing well with regard to her wound currently. She is actually showing signs of excellent improvement and I do feel like the collagen is doing a great job. I am extremely pleased with where we stand today. Objective Constitutional Well-nourished and well-hydrated in no acute distress. [name], [name] ([id number]) Vitals Time Taken: 11:22 AM, Height: 63 in, Weight: 198 lbs, BMI: 35.1, Temperature: 97.6 F, Pulse: 72 bpm, Respiratory Rate: 18 breaths/min, Blood Pressure: 175/98 mmHg. Respiratory normal breathing without difficulty. Psychiatric this patient is able to make decisions and demonstrates good insight into disease process. Alert and Oriented x 3. pleasant and cooperative. General Notes: Upon inspection patient's wound bed showed excellent granulation epithelization there was some slough noted I just used a Q-tip and gauze with saline to try to clean out the surface of the wound there is slough was able to be removed this way without complication and post mechanical debridement patient's wound seems to be doing much better. Integumentary (Hair,  Skin) Wound #1 status is Open. Original cause of wound was Skin Tear/Laceration. The date acquired was: 07/30/2021. The wound has been in treatment 8 weeks. The wound is located on the Left,Anterior Lower Leg. The wound measures 2cm length x 0.7cm width x 0.8cm depth; 1.1cm^2 area and 0.88cm^3 volume. There is Fat Layer (Subcutaneous Tissue) exposed. There is no tunneling or undermining noted. There is a medium amount of serosanguineous drainage noted. The wound margin  is flat and intact. There is medium (34-66%) red granulation within the wound bed. There is a medium (34-66%) amount of necrotic tissue within the wound bed including Adherent Slough. Assessment Active Problems ICD-10 Venous insufficiency (chronic) (peripheral) Non-pressure chronic ulcer of other part of left lower leg with fat layer exposed Fibromyalgia Bilateral primary osteoarthritis of knee Tinea corporis Rash and other nonspecific skin eruption Plan Follow-up Appointments: Return Appointment in 1 week. - once a week provider Nurse Visit as needed - once per week Bathing/ Shower/ Hygiene: May shower with wound dressing protected with water repellent cover or cast protector. No tub bath. Anesthetic (Use 'Patient Medications' Section for Anesthetic Order Entry): Lidocaine applied to wound bed Edema Control - Lymphedema / Segmental Compressive Device / Other: Tubigrip single layer applied. - E Elevate, Exercise Daily and Avoid Standing for Long Periods of Time. Elevate legs to the level of the heart and pump ankles as often as possible Elevate leg(s) parallel to the floor when sitting. Medications-Please add to medication list.: Other: - finish prednisone as prescribed Try Allegra, Claritin or Zyrtec for allergic skin reactions The following medication(s) was prescribed: LMX 4 topical 4 % cream cream topical was prescribed at facility WOUND #1: - Lower Leg Wound Laterality: Left, Anterior Cleanser: Byram Ancillary Kit - 15 Day Supply (Generic) 2 x Per Week/30 Days Discharge Instructions: Use supplies as instructed; Kit contains: (15) Saline Bullets; (15) 3x3 Gauze; 15 pr Gloves Cleanser: Soap and Water 2 x Per Week/30 Days Discharge Instructions: Gently cleanse wound with antibacterial soap, rinse and pat dry prior to dressing wounds Topical: Triamcinolone Acetonide Cream, 0.1%, 15 (g) tube 2 x Per Week/30 Days Discharge Instructions: Apply as directed by provider. Primary  Dressing: Endoform Antimicrobial, 2x2 (in/in) 2 x Per Week/30 Days Secondary Dressing: Zetuvit Plus Silicone Border Dressing 4x4 (in/in) (Generic) 2 x Per Week/30 Days Compression Wrap: Tubi E 2 x Per Week/30 Days 1. Would recommend currently that we going to continue with the wound care measures as before and the patient is in agreement with the plan. This includes the use of the endoform dressing which I think is doing an awesome job. [place], [name] ([id number]) 2. Also can continue to use some of the triamcinolone on the leg in general. 3. I am also can recommend that we have the patient continue with coming in for nurse visits for dressing changes were using Tubigrip but the patient cannot really do this herself unfortunately. We will see patient back for reevaluation in 1 week here in the clinic. If anything worsens or changes patient will contact our office for additional recommendations. Electronic Signature(s) Signed: 10/27/2021 1:17:22 PM By: Worthy Keeler PA-C Entered By: Worthy Keeler on 10/27/2021 13:17:22 [place], Lillette [name] ([id number]) -------------------------------------------------------------------------------- SuperBill Details Patient Name: Choplin, [name] Date of Service: 10/27/2021 Medical Record Number: [id number] Patient Account Number: [id number] Date of Birth/Sex: 08/14/1941 (80 y.o. F) Treating RN: Carlene Coria Primary Care Provider: [name] Other Clinician: Referring Provider: [name] Treating Provider/Extender: Jeri Cos Weeks in Treatment: 8 Diagnosis  Coding ICD-10 Codes Code Description I87.2 Venous insufficiency (chronic) (peripheral) L97.822 Non-pressure chronic ulcer of other part of left lower leg with fat layer exposed M79.7 Fibromyalgia M17.0 Bilateral primary osteoarthritis of knee B35.4 Tinea corporis R21 Rash and other nonspecific skin eruption Facility Procedures CPT4 Code: [id number] Description: 18550 - WOUND CARE  VISIT-LEV 3 EST PT Modifier: Quantity: 1 Physician Procedures CPT4 Code: 1586825 Description: 74935 - WC PHYS LEVEL 3 - EST PT Modifier: Quantity: 1 CPT4 Code: Description: ICD-10 Diagnosis Description I87.2 Venous insufficiency (chronic) (peripheral) L97.822 Non-pressure chronic ulcer of other part of left lower leg with fat lay M79.7 Fibromyalgia M17.0 Bilateral primary osteoarthritis of knee Modifier: er exposed Quantity: Electronic Signature(s) Signed: 10/27/2021 1:17:36 PM By: Worthy Keeler PA-C Entered By: Worthy Keeler on 10/27/2021 13:17:36

## 2021-10-27 NOTE — Progress Notes (Addendum)
one wound '[]'  - 0 Complex Wound Cleansing - multiple wounds X- 1 5 Wound Imaging (photographs - any number of wounds) '[]'  - 0 Wound Tracing (instead of photographs) X- 1 5 Simple Wound Measurement - one wound '[]'  - 0 Complex Wound Measurement - multiple wounds INTERVENTIONS - Wound Dressings X - Small Wound Dressing one or multiple wounds 1 10 '[]'  - 0 Medium Wound Dressing one or multiple wounds '[]'  - 0 Large Wound Dressing one or multiple wounds X- 1 5 Application of Medications - topical '[]'  - 0 Application of Medications - injection INTERVENTIONS - Miscellaneous '[]'  - External ear exam 0 '[]'  - 0 Specimen Collection (cultures, biopsies, blood, body fluids, etc.) '[]'  - 0 Specimen(s) / Culture(s) sent or taken to Lab for analysis '[]'  - 0 Patient Transfer (multiple staff / Civil Service fast streamer / Similar devices) '[]'  - 0 Simple Staple / Suture removal (25 or less) '[]'  - 0 Complex Staple / Suture removal (26 or more) '[]'  - 0 Hypo / Hyperglycemic Management (close monitor of Blood Glucose) '[]'  - 0 Ankle / Brachial Index (ABI) - do not check if billed separately X- 1 5 Vital Signs Has the patient been seen at the hospital within the last three years: Yes Total Score: 80 Level Of Care: New/Established - Level 3 Electronic Signature(s) Signed: 10/27/2021 4:25:56 PM By: Carlene Coria RN Entered By: Carlene Coria on 10/27/2021 11:43:43 [name] [name] [name] ([id number]) -------------------------------------------------------------------------------- Encounter Discharge Information Details Patient Name: Erhard, [name] L. Date of Service: 10/27/2021 11:00 AM Medical Record Number: [id number] Patient Account Number: [id number] Date of Birth/Sex: [birth year] (80 y.o. F) Treating RN: Carlene Coria Primary Care Leovanni Bjorkman: [name] Other Clinician: Referring Amiria Orrison: [name] Treating Latausha Flamm/Extender: Skipper Cliche in Treatment: 8 Encounter Discharge Information Items Discharge Condition: Stable Ambulatory Status: Walker Discharge Destination: Home Transportation: Private Auto Accompanied By: self Schedule Follow-up Appointment: Yes Clinical Summary of Care: Patient Declined Electronic Signature(s) Signed: 10/27/2021 4:25:56 PM By: Carlene Coria RN Entered By: Carlene Coria on 10/27/2021 11:45:55 [name] [name] [name] ([id number]) -------------------------------------------------------------------------------- Lower Extremity Assessment Details Patient Name: [name] [name] L. Date of Service: 10/27/2021 11:00 AM Medical Record Number: [id number] Patient Account Number: [id number] Date of Birth/Sex: 12/31/1940 (80 y.o. F) Treating RN: Carlene Coria Primary Care Valerye Kobus: [name] Other Clinician: Referring Lyam Provencio: [name] Treating Pau Banh/Extender: Jeri Cos Weeks in Treatment: 8 Edema Assessment Assessed: [Left: No] [Right: No] Edema: [Left: Ye] [Right: s] Calf Left: Right: Point of Measurement: 31 cm From Medial Instep 39 cm Ankle Left: Right: Point of Measurement: 11 cm From Medial Instep 28 cm Vascular Assessment Pulses: Dorsalis Pedis Palpable: [Left:Yes] Electronic Signature(s) Signed: 10/27/2021 11:29:55 AM By: Carlene Coria RN Entered By: Carlene Coria on 10/27/2021 11:29:55 [name] [name] [name] ([id number]) -------------------------------------------------------------------------------- Multi Wound Chart Details Patient Name: [name] [name] L. Date of Service: 10/27/2021 11:00 AM Medical Record Number: [id number] Patient Account Number: [id number] Date of Birth/Sex: 06-11-1941 (80 y.o. F) Treating RN: Carlene Coria Primary Care Evalyse Stroope: [name] Other Clinician: Referring Donabelle Molden: [name] Treating  Belia Febo/Extender: Skipper Cliche in Treatment: 8 Vital Signs Height(in): 63 Pulse(bpm): 30 Weight(lbs): 198 Blood Pressure(mmHg): 175/98 Body Mass Index(BMI): 35 Temperature(F): 97.6 Respiratory Rate(breaths/min): 18 Photos: [1:No Photos] [N/A:N/A] Wound Location: [1:Left, Anterior Lower Leg] [N/A:N/A] Wounding Event: [1:Skin Tear/Laceration] [N/A:N/A] Primary Etiology: [1:Venous Leg Ulcer] [N/A:N/A] Comorbid History: [1:Cataracts, Osteoarthritis, Received Radiation] [N/A:N/A] Date Acquired: [1:07/30/2021] [N/A:N/A] Weeks of Treatment: [1:8] [N/A:N/A] Wound Status: [1:Open] [N/A:N/A] Measurements L x W x D (cm) [1:2x0.7x0.8] [N/A:N/A] Area (cm) : [1:1.1] [N/A:N/A]  Volume (cm) : [1:0.88] [N/A:N/A] % Reduction in Area: [1:12.50%] [N/A:N/A] % Reduction in Volume: [1:-40.10%] [N/A:N/A] Classification: [1:Full Thickness Without Exposed Support Structures] [N/A:N/A] Exudate Amount: [1:Medium] [N/A:N/A] Exudate Type: [1:Serosanguineous] [N/A:N/A] Exudate Color: [1:red, brown] [N/A:N/A] Wound Margin: [1:Flat and Intact] [N/A:N/A] Granulation Amount: [1:Medium (34-66%)] [N/A:N/A] Granulation Quality: [1:Red] [N/A:N/A] Necrotic Amount: [1:Medium (34-66%)] [N/A:N/A] Exposed Structures: [1:Fat Layer (Subcutaneous Tissue): Yes Fascia: No Tendon: No Muscle: No Joint: No Bone: No Small (1-33%)] [N/A:N/A N/A] Treatment Notes Electronic Signature(s) Signed: 10/27/2021 4:25:56 PM By: Carlene Coria RN Previous Signature: 10/27/2021 11:17:42 AM Version By: Worthy Keeler PA-C Entered By: Carlene Coria on 10/27/2021 11:34:45 [place], [name] [name] ([id number]) -------------------------------------------------------------------------------- [place] Details Patient Name: [name] [name] L. Date of Service: 10/27/2021 11:00 AM Medical Record Number: [id number] Patient Account Number: [id number] Date of Birth/Sex: 07-May-1941 (80 y.o. F) Treating RN: Carlene Coria Primary Care  Landrie Beale: [name] Other Clinician: Referring Kyjuan Gause: [name] Treating Leydi Winstead/Extender: Skipper Cliche in Treatment: 8 Active Inactive Wound/Skin Impairment Nursing Diagnoses: Impaired tissue integrity Goals: Patient/caregiver will verbalize understanding of skin care regimen Date Initiated: 08/31/2021 Date Inactivated: 09/21/2021 Target Resolution Date: 08/31/2021 Goal Status: Met Ulcer/skin breakdown will have a volume reduction of 30% by week 4 Date Initiated: 08/31/2021 Date Inactivated: 10/17/2021 Target Resolution Date: 10/01/2021 Goal Status: Unmet Unmet Reason: comorbities Ulcer/skin breakdown will have a volume reduction of 50% by week 8 Date Initiated: 08/31/2021 Target Resolution Date: 10/31/2021 Goal Status: Active Ulcer/skin breakdown will have a volume reduction of 80% by week 12 Date Initiated: 08/31/2021 Target Resolution Date: 12/01/2021 Goal Status: Active Ulcer/skin breakdown will heal within 14 weeks Date Initiated: 08/31/2021 Target Resolution Date: 01/01/2022 Goal Status: Active Interventions: Assess patient/caregiver ability to obtain necessary supplies Assess patient/caregiver ability to perform ulcer/skin care regimen upon admission and as needed Assess ulceration(s) every visit Provide education on ulcer and skin care Treatment Activities: Referred to DME Sharelle Burditt for dressing supplies : 08/31/2021 Skin care regimen initiated : 08/31/2021 Notes: Electronic Signature(s) Signed: 10/27/2021 4:25:56 PM By: Carlene Coria RN Entered By: Carlene Coria on 10/27/2021 11:34:16 Mcshan, [name] [name] ([id number]) -------------------------------------------------------------------------------- Pain Assessment Details Patient Name: [name] [name] L. Date of Service: 10/27/2021 11:00 AM Medical Record Number: [id number] Patient Account Number: [id number] Date of Birth/Sex: 22-Jul-1941 (80 y.o. F) Treating RN: Carlene Coria Primary Care Brantlee Penn: [name] Other Clinician: Referring Twylah Bennetts: [name] Treating Tyquarius Paglia/Extender: Skipper Cliche in Treatment: 8 Active Problems Location of Pain Severity and Description of Pain Patient Has Paino No Site Locations Pain Management and Medication Current Pain Management: Electronic Signature(s) Signed: 10/27/2021 4:25:56 PM By: Carlene Coria RN Entered By: Carlene Coria on 10/27/2021 11:22:32 [place], [name] [name] ([id number]) -------------------------------------------------------------------------------- Patient/Caregiver Education Details Patient Name: Bhatnagar, Donnika L. Date of Service: 10/27/2021 11:00 AM Medical Record Number: [id number] Patient Account Number: [id number] Date of Birth/Gender: [birth year] (80 y.o. F) Treating RN: Carlene Coria Primary Care Physician: [name] Other Clinician: Referring Physician: [name] Treating Physician/Extender: Skipper Cliche in Treatment: 8 Education Assessment Education Provided To: Patient Education Topics Provided Wound/Skin Impairment: Methods: Explain/Verbal Responses: State content correctly Electronic Signature(s) Signed: 10/27/2021 4:25:56 PM By: Carlene Coria RN Entered By: Carlene Coria on 10/27/2021 11:44:15 [name] [name] [name] ([id number]) -------------------------------------------------------------------------------- Wound Assessment Details Patient Name: [name] [name] L. Date of Service: 10/27/2021 11:00 AM Medical Record Number: [id number] Patient Account Number: [id number] Date of Birth/Sex: [birth year] (80 y.o. F) Treating RN: Carlene Coria Primary Care Angelic Schnelle: [name] Other Clinician: Referring Charlene Cowdrey: [name] Treating Shalon Councilman/Extender: Skipper Cliche in Treatment:  MEHR, DEPAOLI ([id number]) Visit Report for 10/27/2021 Arrival Information Details Patient Name: TISHANNA, DUNFORD. Date of Service: 10/27/2021 11:00 AM Medical Record Number: [id number] Patient Account Number: [id number] Date of Birth/Sex: [birth year] (80 y.o. F) Treating RN: Carlene Coria Primary Care Theophile Harvie: [name] Other Clinician: Referring Hoa Briggs: [name] Treating Yarel Rushlow/Extender: Skipper Cliche in Treatment: 8 Visit Information History Since Last Visit All ordered tests and consults were completed: No Patient Arrived: Ambulatory Added or deleted any medications: No Arrival Time: 11:14 Any new allergies or adverse reactions: No Accompanied By: self Had a fall or experienced change in No Transfer Assistance: None activities of daily living that may affect Patient Identification Verified: Yes risk of falls: Secondary Verification Process Completed: Yes Signs or symptoms of abuse/neglect since last visito No Patient Requires Transmission-Based Precautions: No Hospitalized since last visit: No Patient Has Alerts: No Implantable device outside of the clinic excluding No cellular tissue based products placed in the center since last visit: Has Dressing in Place as Prescribed: Yes Pain Present Now: No Electronic Signature(s) Signed: 10/27/2021 4:25:56 PM By: Carlene Coria RN Entered By: Carlene Coria on 10/27/2021 11:21:56 [place], [name] [name] ([id number]) -------------------------------------------------------------------------------- Clinic Level of Care Assessment Details Patient Name: [name] [name] L. Date of Service: 10/27/2021 11:00 AM Medical Record Number: [id number] Patient Account Number: [id number] Date of Birth/Sex: 1941-06-25 (80 y.o. F) Treating RN: Carlene Coria Primary Care Thijs Brunton: [name] Other Clinician: Referring Islah Eve: [name] Treating Denorris Reust/Extender: Skipper Cliche in Treatment: 8 Clinic Level of Care  Assessment Items TOOL 4 Quantity Score X - Use when only an EandM is performed on FOLLOW-UP visit 1 0 ASSESSMENTS - Nursing Assessment / Reassessment X - Reassessment of Co-morbidities (includes updates in patient status) 1 10 X- 1 5 Reassessment of Adherence to Treatment Plan ASSESSMENTS - Wound and Skin Assessment / Reassessment X - Simple Wound Assessment / Reassessment - one wound 1 5 '[]'  - 0 Complex Wound Assessment / Reassessment - multiple wounds '[]'  - 0 Dermatologic / Skin Assessment (not related to wound area) ASSESSMENTS - Focused Assessment '[]'  - Circumferential Edema Measurements - multi extremities 0 '[]'  - 0 Nutritional Assessment / Counseling / Intervention '[]'  - 0 Lower Extremity Assessment (monofilament, tuning fork, pulses) '[]'  - 0 Peripheral Arterial Disease Assessment (using hand held doppler) ASSESSMENTS - Ostomy and/or Continence Assessment and Care '[]'  - Incontinence Assessment and Management 0 '[]'  - 0 Ostomy Care Assessment and Management (repouching, etc.) PROCESS - Coordination of Care X - Simple Patient / Family Education for ongoing care 1 15 '[]'  - 0 Complex (extensive) Patient / Family Education for ongoing care '[]'  - 0 Staff obtains Programmer, systems, Records, Test Results / Process Orders '[]'  - 0 Staff telephones HHA, Nursing Homes / Clarify orders / etc '[]'  - 0 Routine Transfer to another Facility (non-emergent condition) '[]'  - 0 Routine Hospital Admission (non-emergent condition) '[]'  - 0 New Admissions / Biomedical engineer / Ordering NPWT, Apligraf, etc. '[]'  - 0 Emergency Hospital Admission (emergent condition) X- 1 10 Simple Discharge Coordination '[]'  - 0 Complex (extensive) Discharge Coordination PROCESS - Special Needs '[]'  - Pediatric / Minor Patient Management 0 '[]'  - 0 Isolation Patient Management '[]'  - 0 Hearing / Language / Visual special needs '[]'  - 0 Assessment of Community assistance (transportation, D/C planning, etc.) '[]'  - 0 Additional  assistance / Altered mentation '[]'  - 0 Support Surface(s) Assessment (bed, cushion, seat, etc.) INTERVENTIONS - Wound Cleansing / Measurement [name] [name] L. ([id number]) X- 1 5 Simple Wound Cleansing -  MEHR, DEPAOLI ([id number]) Visit Report for 10/27/2021 Arrival Information Details Patient Name: TISHANNA, DUNFORD. Date of Service: 10/27/2021 11:00 AM Medical Record Number: [id number] Patient Account Number: [id number] Date of Birth/Sex: [birth year] (80 y.o. F) Treating RN: Carlene Coria Primary Care Theophile Harvie: [name] Other Clinician: Referring Hoa Briggs: [name] Treating Yarel Rushlow/Extender: Skipper Cliche in Treatment: 8 Visit Information History Since Last Visit All ordered tests and consults were completed: No Patient Arrived: Ambulatory Added or deleted any medications: No Arrival Time: 11:14 Any new allergies or adverse reactions: No Accompanied By: self Had a fall or experienced change in No Transfer Assistance: None activities of daily living that may affect Patient Identification Verified: Yes risk of falls: Secondary Verification Process Completed: Yes Signs or symptoms of abuse/neglect since last visito No Patient Requires Transmission-Based Precautions: No Hospitalized since last visit: No Patient Has Alerts: No Implantable device outside of the clinic excluding No cellular tissue based products placed in the center since last visit: Has Dressing in Place as Prescribed: Yes Pain Present Now: No Electronic Signature(s) Signed: 10/27/2021 4:25:56 PM By: Carlene Coria RN Entered By: Carlene Coria on 10/27/2021 11:21:56 [place], [name] [name] ([id number]) -------------------------------------------------------------------------------- Clinic Level of Care Assessment Details Patient Name: [name] [name] L. Date of Service: 10/27/2021 11:00 AM Medical Record Number: [id number] Patient Account Number: [id number] Date of Birth/Sex: 1941-06-25 (80 y.o. F) Treating RN: Carlene Coria Primary Care Thijs Brunton: [name] Other Clinician: Referring Islah Eve: [name] Treating Denorris Reust/Extender: Skipper Cliche in Treatment: 8 Clinic Level of Care  Assessment Items TOOL 4 Quantity Score X - Use when only an EandM is performed on FOLLOW-UP visit 1 0 ASSESSMENTS - Nursing Assessment / Reassessment X - Reassessment of Co-morbidities (includes updates in patient status) 1 10 X- 1 5 Reassessment of Adherence to Treatment Plan ASSESSMENTS - Wound and Skin Assessment / Reassessment X - Simple Wound Assessment / Reassessment - one wound 1 5 '[]'  - 0 Complex Wound Assessment / Reassessment - multiple wounds '[]'  - 0 Dermatologic / Skin Assessment (not related to wound area) ASSESSMENTS - Focused Assessment '[]'  - Circumferential Edema Measurements - multi extremities 0 '[]'  - 0 Nutritional Assessment / Counseling / Intervention '[]'  - 0 Lower Extremity Assessment (monofilament, tuning fork, pulses) '[]'  - 0 Peripheral Arterial Disease Assessment (using hand held doppler) ASSESSMENTS - Ostomy and/or Continence Assessment and Care '[]'  - Incontinence Assessment and Management 0 '[]'  - 0 Ostomy Care Assessment and Management (repouching, etc.) PROCESS - Coordination of Care X - Simple Patient / Family Education for ongoing care 1 15 '[]'  - 0 Complex (extensive) Patient / Family Education for ongoing care '[]'  - 0 Staff obtains Programmer, systems, Records, Test Results / Process Orders '[]'  - 0 Staff telephones HHA, Nursing Homes / Clarify orders / etc '[]'  - 0 Routine Transfer to another Facility (non-emergent condition) '[]'  - 0 Routine Hospital Admission (non-emergent condition) '[]'  - 0 New Admissions / Biomedical engineer / Ordering NPWT, Apligraf, etc. '[]'  - 0 Emergency Hospital Admission (emergent condition) X- 1 10 Simple Discharge Coordination '[]'  - 0 Complex (extensive) Discharge Coordination PROCESS - Special Needs '[]'  - Pediatric / Minor Patient Management 0 '[]'  - 0 Isolation Patient Management '[]'  - 0 Hearing / Language / Visual special needs '[]'  - 0 Assessment of Community assistance (transportation, D/C planning, etc.) '[]'  - 0 Additional  assistance / Altered mentation '[]'  - 0 Support Surface(s) Assessment (bed, cushion, seat, etc.) INTERVENTIONS - Wound Cleansing / Measurement [name] [name] L. ([id number]) X- 1 5 Simple Wound Cleansing -

## 2021-10-30 ENCOUNTER — Other Ambulatory Visit: Payer: Self-pay

## 2021-10-30 DIAGNOSIS — L97822 Non-pressure chronic ulcer of other part of left lower leg with fat layer exposed: Secondary | ICD-10-CM | POA: Diagnosis not present

## 2021-10-31 ENCOUNTER — Telehealth: Payer: Self-pay | Admitting: Family Medicine

## 2021-10-31 NOTE — Progress Notes (Signed)
MATISSE, ROSKELLEY ([id number]) Visit Report for 10/30/2021 Physician Orders Details Patient Name: [name], [name]. Date of Service: 10/30/2021 3:30 PM Medical Record Number: [id number] Patient Account Number: [id number] Date of Birth/Sex: [birth year] (80 y.o. F) Treating RN: Carlene Coria Primary Care Provider: [name] Other Clinician: Referring Provider: [name] Treating Provider/Extender: Skipper Cliche in Treatment: 8 Verbal / Phone Orders: No Diagnosis Coding Follow-up Appointments o Return Appointment in 1 week. - once a week provider o Nurse Visit as needed - once per week Bathing/ Shower/ Hygiene o May shower with wound dressing protected with water repellent cover or cast protector. o No tub bath. Anesthetic (Use 'Patient Medications' Section for Anesthetic Order Entry) o Lidocaine applied to wound bed Edema Control - Lymphedema / Segmental Compressive Device / Other o Tubigrip single layer applied. - E o Elevate, Exercise Daily and Avoid Standing for Long Periods of Time. o Elevate legs to the level of the heart and pump ankles as often as possible o Elevate leg(s) parallel to the floor when sitting. Medications-Please add to medication list. o Other: - finish prednisone as prescribed Try Allegra, Claritin or Zyrtec for allergic skin reactions Wound Treatment Wound #1 - Lower Leg Wound Laterality: Left, Anterior Cleanser: [place]ram Ancillary Kit - 15 Day Supply (Generic) 2 x Per Week/30 Days Discharge Instructions: Use supplies as instructed; Kit contains: (15) Saline Bullets; (15) 3x3 Gauze; 15 pr Gloves Cleanser: Soap and Water 2 x Per Week/30 Days Discharge Instructions: Gently cleanse wound with antibacterial soap, rinse and pat dry prior to dressing wounds Topical: Triamcinolone Acetonide Cream, 0.1%, 15 (g) tube 2 x Per Week/30 Days Discharge Instructions: Apply as directed by provider. Primary Dressing: Endoform Antimicrobial, 2x2  (in/in) 2 x Per Week/30 Days Secondary Dressing: Zetuvit Plus Silicone Border Dressing 4x4 (in/in) (Generic) 2 x Per Week/30 Days Compression Wrap: Tubi E 2 x Per Week/30 Days Electronic Signature(s) Signed: 10/31/2021 9:26:06 AM By: Worthy Keeler PA-C Signed: 10/31/2021 10:19:34 AM By: Carlene Coria RN Entered By: Carlene Coria on 10/30/2021 15:57:40 Toothman, [name] ([id number]) -------------------------------------------------------------------------------- [place] Details Patient Name: Borntreger, Dacota L. Date of Service: 10/30/2021 Medical Record Number: [id number] Patient Account Number: [id number] Date of Birth/Sex: [birth year] (80 y.o. 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

## 2021-10-31 NOTE — Progress Notes (Signed)
[name] ([id number]) Visit Report for 10/30/2021 Arrival Information Details Patient Name: [name], [name]. Date of Service: 10/30/2021 3:30 PM Medical Record Number: [id number] Patient Account Number: [id number] Date of Birth/Sex: March 07, 1941 (80 y.o. F) Treating RN: Yevonne Pax Primary Care Faatimah Spielberg: [name] Givens Other Clinician: Referring Sue Mcalexander: [name] Givens Treating Tahani Potier/Extender: Rowan Blase in Treatment: 8 Visit Information History Since Last Visit All ordered tests and consults were completed: No Patient Arrived: Ambulatory Added or deleted any medications: No Arrival Time: 15:58 Any new allergies or adverse reactions: No Accompanied By: self Had a fall or experienced change in No Transfer Assistance: None activities of daily living that may affect Patient Identification Verified: Yes risk of falls: Secondary Verification Process Completed: Yes Signs or symptoms of abuse/neglect since last visito No Patient Requires Transmission-Based Precautions: No Hospitalized since last visit: No Patient Has Alerts: No Implantable device outside of the clinic excluding No cellular tissue based products placed in the center since last visit: Has Dressing in Place as Prescribed: Yes Has Compression in Place as Prescribed: Yes Pain Present Now: No Electronic Signature(s) Signed: 10/31/2021 10:19:34 AM By: Yevonne Pax RN Entered By: Yevonne Pax on 10/30/2021 15:58:38 [name], [name] ([id number]) -------------------------------------------------------------------------------- Clinic Level of Care Assessment Details Patient Name: [name], [name] Date of Service: 10/30/2021 3:30 PM Medical Record Number: [id number] Patient Account Number: [id number] Date of Birth/Sex: [birth year] (80 y.o. F) Treating RN: Yevonne Pax Primary Care Ellie Bryand: [name] Givens Other Clinician: Referring Awais Cobarrubias: [name] Givens Treating Lawana Hartzell/Extender: Rowan Blase in Treatment: 8 Clinic Level of Care Assessment Items TOOL 4 Quantity Score X - Use when only an EandM is performed on FOLLOW-UP visit 1 0 ASSESSMENTS - Nursing Assessment / Reassessment X - Reassessment of Co-morbidities (includes updates in patient status) 1 10 X- 1 5 Reassessment of Adherence to Treatment Plan ASSESSMENTS - Wound and Skin Assessment / Reassessment []  - Simple Wound Assessment / Reassessment - one wound 0 X- 2 5 Complex Wound Assessment / Reassessment - multiple wounds []  - 0 Dermatologic / Skin Assessment (not related to wound area) ASSESSMENTS - Focused Assessment []  - Circumferential Edema Measurements - multi extremities 0 []  - 0 Nutritional Assessment / Counseling / Intervention []  - 0 Lower Extremity Assessment (monofilament, tuning fork, pulses) []  - 0 Peripheral Arterial Disease Assessment (using hand held doppler) ASSESSMENTS - Ostomy and/or Continence Assessment and Care []  - Incontinence Assessment and Management 0 []  - 0 Ostomy Care Assessment and Management (repouching, etc.) PROCESS - Coordination of Care X - Simple Patient / Family Education for ongoing care 1 15 []  - 0 Complex (extensive) Patient / Family Education for ongoing care []  - 0 Staff obtains Chiropractor, Records, Test Results / Process Orders []  - 0 Staff telephones HHA, Nursing Homes / Clarify orders / etc []  - 0 Routine Transfer to another Facility (non-emergent condition) []  - 0 Routine Hospital Admission (non-emergent condition) []  - 0 New Admissions / Manufacturing engineer / Ordering NPWT, Apligraf, etc. []  - 0 Emergency Hospital Admission (emergent condition) X- 1 10 Simple Discharge Coordination []  - 0 Complex (extensive) Discharge Coordination PROCESS - Special Needs []  - Pediatric / Minor Patient Management 0 []  - 0 Isolation Patient Management []  - 0 Hearing / Language / Visual special needs []  - 0 Assessment of Community assistance  (transportation, D/C planning, etc.) []  - 0 Additional assistance / Altered mentation []  - 0 Support Surface(s) Assessment (bed, cushion, seat, etc.) INTERVENTIONS - Wound Cleansing / Measurement [name], [name] ([id number]) X-  1 5 Simple Wound Cleansing - one wound []  - 0 Complex Wound Cleansing - multiple wounds X- 1 5 Wound Imaging (photographs - any number of wounds) []  - 0 Wound Tracing (instead of photographs) X- 1 5 Simple Wound Measurement - one wound []  - 0 Complex Wound Measurement - multiple wounds INTERVENTIONS - Wound Dressings X - Small Wound Dressing one or multiple wounds 1 10 []  - 0 Medium Wound Dressing one or multiple wounds []  - 0 Large Wound Dressing one or multiple wounds X- 1 5 Application of Medications - topical []  - 0 Application of Medications - injection INTERVENTIONS - Miscellaneous []  - External ear exam 0 []  - 0 Specimen Collection (cultures, biopsies, blood, body fluids, etc.) []  - 0 Specimen(s) / Culture(s) sent or taken to Lab for analysis []  - 0 Patient Transfer (multiple staff / Nurse, adult / Similar devices) []  - 0 Simple Staple / Suture removal (25 or less) []  - 0 Complex Staple / Suture removal (26 or more) []  - 0 Hypo / Hyperglycemic Management (close monitor of Blood Glucose) []  - 0 Ankle / Brachial Index (ABI) - do not check if billed separately X- 1 5 Vital Signs Has the patient been seen at the hospital within the last three years: Yes Total Score: 85 Level Of Care: New/Established - Level 3 Electronic Signature(s) Signed: 10/31/2021 10:19:34 AM By: Yevonne Pax RN Entered By: Yevonne Pax on 10/30/2021 16:23:24 [name], [name] ([id number]) -------------------------------------------------------------------------------- Encounter Discharge Information Details Patient Name: [name], [name] Date of Service: 10/30/2021 3:30 PM Medical Record Number: [id number] Patient Account Number: [id number] Date of  Birth/Sex: 08/03/1941 (80 y.o. F) Treating RN: Yevonne Pax Primary Care Meral Geissinger: [name] Givens Other Clinician: Referring Leslea Vowles: [name] Givens Treating Tonda Wiederhold/Extender: Rowan Blase in Treatment: 8 Encounter Discharge Information Items Discharge Condition: Stable Ambulatory Status: Walker Discharge Destination: Home Transportation: Private Auto Accompanied By: self Schedule Follow-up Appointment: Yes Clinical Summary of Care: Patient Declined Electronic Signature(s) Signed: 10/30/2021 4:14:07 PM By: Yevonne Pax RN Entered By: Yevonne Pax on 10/30/2021 16:14:07 Clopper, [name] ([id number]) -------------------------------------------------------------------------------- Wound Assessment Details Patient Name: [name], [name] Date of Service: 10/30/2021 3:30 PM Medical Record Number: [id number] Patient Account Number: [id number] Date of Birth/Sex: 11-Jul-1941 (80 y.o. F) Treating RN: Yevonne Pax Primary Care Seline Enzor: [name] Givens Other Clinician: Referring Mahamud Metts: [name] Givens Treating Loanne Emery/Extender: Allen Derry Weeks in Treatment: 8 Wound Status Wound Number: 1 Primary Etiology: Venous Leg Ulcer Wound Location: Left, Anterior Lower Leg Wound Status: Open Wounding Event: Skin Tear/Laceration Comorbid History: Cataracts, Osteoarthritis, Received Radiation Date Acquired: 07/30/2021 Weeks Of Treatment: 8 Clustered Wound: No Wound Measurements Length: (cm) 2 Width: (cm) 0.7 Depth: (cm) 0.8 Area: (cm) 1.1 Volume: (cm) 0.88 % Reduction in Area: 12.5% % Reduction in Volume: -40.1% Epithelialization: Small (1-33%) Tunneling: No Undermining: No Wound Description Classification: Full Thickness Without Exposed Support Structures Wound Margin: Flat and Intact Exudate Amount: Medium Exudate Type: Serosanguineous Exudate Color: red, brown Foul Odor After Cleansing: No Slough/Fibrino Yes Wound Bed Granulation Amount: Medium (34-66%) Exposed  Structure Granulation Quality: Red Fascia Exposed: No Necrotic Amount: Medium (34-66%) Fat Layer (Subcutaneous Tissue) Exposed: Yes Necrotic Quality: Adherent Slough Tendon Exposed: No Muscle Exposed: No Joint Exposed: No Bone Exposed: No Treatment Notes Wound #1 (Lower Leg) Wound Laterality: Left, Anterior Cleanser Byram Ancillary Kit - 15 Day Supply Discharge Instruction: Use supplies as instructed; Kit contains: (15) Saline Bullets; (15) 3x3 Gauze; 15 pr Gloves Soap and Water Discharge Instruction: Gently cleanse wound with antibacterial soap, rinse  and pat dry prior to dressing wounds Peri-Wound Care Topical Triamcinolone Acetonide Cream, 0.1%, 15 (g) tube Discharge Instruction: Apply as directed by Ocean Schildt. Primary Dressing Endoform Antimicrobial, 2x2 (in/in) Secondary Dressing Zetuvit Plus Silicone Border Dressing 4x4 (in/in) Secured With Compression Wrap RUI, SIKKEMA ([id number]) Tubi E Compression Stockings Add-Ons Electronic Signature(s) Signed: 10/30/2021 4:21:04 PM By: Yevonne Pax RN Previous Signature: 10/30/2021 4:12:56 PM Version By: Yevonne Pax RN Entered By: Yevonne Pax on 10/30/2021 16:21:04

## 2021-10-31 NOTE — Telephone Encounter (Signed)
[name].  Please schedule patient an appointment with Dr. Lorelei Pont.

## 2021-10-31 NOTE — Telephone Encounter (Signed)
[name] called stating that she had an injury on front part of left leg. [name] was asking if Dr. Lorelei Pont could give her a shot in right knee. Please advise.

## 2021-11-01 ENCOUNTER — Other Ambulatory Visit: Payer: Self-pay

## 2021-11-01 DIAGNOSIS — L97822 Non-pressure chronic ulcer of other part of left lower leg with fat layer exposed: Secondary | ICD-10-CM | POA: Diagnosis not present

## 2021-11-01 NOTE — Progress Notes (Signed)
HONESTI, SEABERG ([id number]) Visit Report for 11/01/2021 Physician Orders Details Patient Name: [name], [name]. Date of Service: 11/01/2021 3:30 PM Medical Record Number: [id number] Patient Account Number: [id number] Date of Birth/Sex: 1941-04-06 (80 y.o. F) Treating RN: Carlene Coria Primary Care Provider: [name] Other Clinician: Referring Provider: [name] Treating Provider/Extender: Yaakov Guthrie in Treatment: 8 Verbal / Phone Orders: No Diagnosis Coding Follow-up Appointments o Return Appointment in 1 week. - once a week provider o Nurse Visit as needed - once per week Bathing/ Shower/ Hygiene o May shower with wound dressing protected with water repellent cover or cast protector. o No tub bath. Anesthetic (Use 'Patient Medications' Section for Anesthetic Order Entry) o Lidocaine applied to wound bed Edema Control - Lymphedema / Segmental Compressive Device / Other o Tubigrip single layer applied. - E o Elevate, Exercise Daily and Avoid Standing for Long Periods of Time. o Elevate legs to the level of the heart and pump ankles as often as possible o Elevate leg(s) parallel to the floor when sitting. Medications-Please add to medication list. o Other: - finish prednisone as prescribed Try Allegra, Claritin or Zyrtec for allergic skin reactions Wound Treatment Wound #1 - Lower Leg Wound Laterality: Left, Anterior Cleanser: Byram Ancillary Kit - 15 Day Supply (Generic) 2 x Per Week/30 Days Discharge Instructions: Use supplies as instructed; Kit contains: (15) Saline Bullets; (15) 3x3 Gauze; 15 pr Gloves Cleanser: Soap and Water 2 x Per Week/30 Days Discharge Instructions: Gently cleanse wound with antibacterial soap, rinse and pat dry prior to dressing wounds Topical: Triamcinolone Acetonide Cream, 0.1%, 15 (g) tube 2 x Per Week/30 Days Discharge Instructions: Apply as directed by provider. Primary Dressing: Endoform Antimicrobial,  2x2 (in/in) 2 x Per Week/30 Days Secondary Dressing: Zetuvit Plus Silicone Border Dressing 4x4 (in/in) (Generic) 2 x Per Week/30 Days Compression Wrap: Tubi E 2 x Per Week/30 Days Electronic Signature(s) Signed: 11/01/2021 3:58:51 PM By: Carlene Coria RN Signed: 11/01/2021 4:07:25 PM By: Kalman Shan DO Entered By: Carlene Coria on 11/01/2021 15:53:24 [name], [name] ([id number]) -------------------------------------------------------------------------------- SuperBill Details Patient Name: [name], [name] Date of Service: 11/01/2021 Medical Record Number: [id number] Patient Account Number: [id number] Date of Birth/Sex: 1941/05/04 (80 y.o. 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

## 2021-11-01 NOTE — Progress Notes (Signed)
[name], [name] ([id number]) Visit Report for 11/01/2021 Arrival Information Details Patient Name: [name], [name]. Date of Service: 11/01/2021 3:30 PM Medical Record Number: [id number] Patient Account Number: [id number] Date of Birth/Sex: 1941-06-10 (80 y.o. F) Treating RN: Yevonne Pax Primary Care Jurgen Groeneveld: [name] Givens Other Clinician: Referring Odesser Tourangeau: [name] Givens Treating Silvie Obremski/Extender: Tilda Franco in Treatment: 8 Visit Information History Since Last Visit All ordered tests and consults were completed: No Patient Arrived: Dan Humphreys Added or deleted any medications: No Arrival Time: 15:50 Any new allergies or adverse reactions: No Accompanied By: self Had a fall or experienced change in No Transfer Assistance: None activities of daily living that may affect Patient Identification Verified: Yes risk of falls: Secondary Verification Process Completed: Yes Signs or symptoms of abuse/neglect since last visito No Patient Requires Transmission-Based Precautions: No Hospitalized since last visit: No Patient Has Alerts: No Implantable device outside of the clinic excluding No cellular tissue based products placed in the center since last visit: Has Dressing in Place as Prescribed: Yes Pain Present Now: No Electronic Signature(s) Signed: 11/01/2021 3:58:51 PM By: Yevonne Pax RN Entered By: Yevonne Pax on 11/01/2021 15:51:15 Convey, [name] ([id number]) -------------------------------------------------------------------------------- Clinic Level of Care Assessment Details Patient Name: Minor, [name] Date of Service: 11/01/2021 3:30 PM Medical Record Number: [id number] Patient Account Number: [id number] Date of Birth/Sex: [birth year] (80 y.o. F) Treating RN: Yevonne Pax Primary Care Kaleyah Labreck: [name] Givens Other Clinician: Referring Dreyah Montrose: [name] Givens Treating Tigerlily Christine/Extender: Tilda Franco in Treatment: 8 Clinic Level of Care  Assessment Items TOOL 4 Quantity Score X - Use when only an EandM is performed on FOLLOW-UP visit 1 0 ASSESSMENTS - Nursing Assessment / Reassessment X - Reassessment of Co-morbidities (includes updates in patient status) 1 10 X- 1 5 Reassessment of Adherence to Treatment Plan ASSESSMENTS - Wound and Skin Assessment / Reassessment X - Simple Wound Assessment / Reassessment - one wound 1 5 []  - 0 Complex Wound Assessment / Reassessment - multiple wounds []  - 0 Dermatologic / Skin Assessment (not related to wound area) ASSESSMENTS - Focused Assessment []  - Circumferential Edema Measurements - multi extremities 0 []  - 0 Nutritional Assessment / Counseling / Intervention []  - 0 Lower Extremity Assessment (monofilament, tuning fork, pulses) []  - 0 Peripheral Arterial Disease Assessment (using hand held doppler) ASSESSMENTS - Ostomy and/or Continence Assessment and Care []  - Incontinence Assessment and Management 0 []  - 0 Ostomy Care Assessment and Management (repouching, etc.) PROCESS - Coordination of Care X - Simple Patient / Family Education for ongoing care 1 15 []  - 0 Complex (extensive) Patient / Family Education for ongoing care []  - 0 Staff obtains Chiropractor, Records, Test Results / Process Orders []  - 0 Staff telephones HHA, Nursing Homes / Clarify orders / etc []  - 0 Routine Transfer to another Facility (non-emergent condition) []  - 0 Routine Hospital Admission (non-emergent condition) []  - 0 New Admissions / Manufacturing engineer / Ordering NPWT, Apligraf, etc. []  - 0 Emergency Hospital Admission (emergent condition) X- 1 10 Simple Discharge Coordination []  - 0 Complex (extensive) Discharge Coordination PROCESS - Special Needs []  - Pediatric / Minor Patient Management 0 []  - 0 Isolation Patient Management []  - 0 Hearing / Language / Visual special needs []  - 0 Assessment of Community assistance (transportation, D/C planning, etc.) []  - 0 Additional  assistance / Altered mentation []  - 0 Support Surface(s) Assessment (bed, cushion, seat, etc.) INTERVENTIONS - Wound Cleansing / Measurement [name], [name] ([id number]) X- 1 5 Simple Wound Cleansing -  one wound []  - 0 Complex Wound Cleansing - multiple wounds X- 1 5 Wound Imaging (photographs - any number of wounds) []  - 0 Wound Tracing (instead of photographs) X- 1 5 Simple Wound Measurement - one wound []  - 0 Complex Wound Measurement - multiple wounds INTERVENTIONS - Wound Dressings X - Small Wound Dressing one or multiple wounds 1 10 []  - 0 Medium Wound Dressing one or multiple wounds []  - 0 Large Wound Dressing one or multiple wounds []  - 0 Application of Medications - topical []  - 0 Application of Medications - injection INTERVENTIONS - Miscellaneous []  - External ear exam 0 []  - 0 Specimen Collection (cultures, biopsies, blood, body fluids, etc.) []  - 0 Specimen(s) / Culture(s) sent or taken to Lab for analysis []  - 0 Patient Transfer (multiple staff / Nurse, adult / Similar devices) []  - 0 Simple Staple / Suture removal (25 or less) []  - 0 Complex Staple / Suture removal (26 or more) []  - 0 Hypo / Hyperglycemic Management (close monitor of Blood Glucose) []  - 0 Ankle / Brachial Index (ABI) - do not check if billed separately X- 1 5 Vital Signs Has the patient been seen at the hospital within the last three years: Yes Total Score: 75 Level Of Care: New/Established - Level 2 Electronic Signature(s) Signed: 11/01/2021 3:58:51 PM By: Yevonne Pax RN Entered By: Yevonne Pax on 11/01/2021 15:54:56 Fontanella, [name] ([id number]) -------------------------------------------------------------------------------- Encounter Discharge Information Details Patient Name: [name], [name] Date of Service: 11/01/2021 3:30 PM Medical Record Number: [id number] Patient Account Number: [id number] Date of Birth/Sex: 27-Apr-1941 (80 y.o. F) Treating RN: Yevonne Pax Primary Care Lakasha Mcfall: [name] Givens Other Clinician: Referring Juleah Paradise: [name] Givens Treating Diezel Mazur/Extender: Tilda Franco in Treatment: 8 Encounter Discharge Information Items Discharge Condition: Stable Ambulatory Status: Walker Discharge Destination: Home Transportation: Private Auto Accompanied By: self Schedule Follow-up Appointment: Yes Clinical Summary of Care: Patient Declined Electronic Signature(s) Signed: 11/01/2021 3:58:51 PM By: Yevonne Pax RN Entered By: Yevonne Pax on 11/01/2021 15:54:25 Marchuk, [name] ([id number]) -------------------------------------------------------------------------------- Wound Assessment Details Patient Name: Eisinger, Kathrynn L. Date of Service: 11/01/2021 3:30 PM Medical Record Number: [id number] Patient Account Number: [id number] Date of Birth/Sex: Feb 16, 1941 (80 y.o. F) Treating RN: Yevonne Pax Primary Care Ammie Warrick: [name] Givens Other Clinician: Referring Asalee Barrette: [name] Givens Treating Jairo Bellew/Extender: Tilda Franco in Treatment: 8 Wound Status Wound Number: 1 Primary Etiology: Venous Leg Ulcer Wound Location: Left, Anterior Lower Leg Wound Status: Open Wounding Event: Skin Tear/Laceration Comorbid History: Cataracts, Osteoarthritis, Received Radiation Date Acquired: 07/30/2021 Weeks Of Treatment: 8 Clustered Wound: No Wound Measurements Length: (cm) 2 Width: (cm) 0.7 Depth: (cm) 0.8 Area: (cm) 1.1 Volume: (cm) 0.88 % Reduction in Area: 12.5% % Reduction in Volume: -40.1% Epithelialization: Small (1-33%) Tunneling: No Undermining: No Wound Description Classification: Full Thickness Without Exposed Support Structures Wound Margin: Flat and Intact Exudate Amount: Medium Exudate Type: Serosanguineous Exudate Color: red, brown Foul Odor After Cleansing: No Slough/Fibrino Yes Wound Bed Granulation Amount: Medium (34-66%) Exposed Structure Granulation Quality: Red Fascia  Exposed: No Necrotic Amount: Medium (34-66%) Fat Layer (Subcutaneous Tissue) Exposed: Yes Necrotic Quality: Adherent Slough Tendon Exposed: No Muscle Exposed: No Joint Exposed: No Bone Exposed: No Treatment Notes Wound #1 (Lower Leg) Wound Laterality: Left, Anterior Cleanser Byram Ancillary Kit - 15 Day Supply Discharge Instruction: Use supplies as instructed; Kit contains: (15) Saline Bullets; (15) 3x3 Gauze; 15 pr Gloves Soap and Water Discharge Instruction: Gently cleanse wound with antibacterial soap, rinse and pat dry prior to dressing  wounds Peri-Wound Care Topical Triamcinolone Acetonide Cream, 0.1%, 15 (g) tube Discharge Instruction: Apply as directed by Navika Hoopes. Primary Dressing Endoform Antimicrobial, 2x2 (in/in) Secondary Dressing Zetuvit Plus Silicone Border Dressing 4x4 (in/in) Secured With Compression Wrap BURGUNDY, EWALD ([id number]) Tubi E Compression Stockings Add-Ons Electronic Signature(s) Signed: 11/01/2021 3:58:51 PM By: Yevonne Pax RN Entered By: Yevonne Pax on 11/01/2021 15:51:53

## 2021-11-01 NOTE — Progress Notes (Signed)
N/A Area (cm) : 0.942 N/A N/A Volume (cm) : 0.565 N/A N/A % Reduction in Area: 25.10% N/A N/A % Reduction in Volume: 10.00% N/A N/A Classification: Full Thickness Without Exposed N/A N/A Support Structures Exudate Amount: Medium N/A N/A Exudate Type: Serosanguineous N/A N/A Exudate Color: red, brown N/A N/A Wound Margin: Flat and Intact N/A N/A Granulation Amount: Medium (34-66%) N/A N/A Granulation Quality: Red N/A N/A Necrotic Amount: Medium (34-66%) N/A N/A Exposed Structures: Fat Layer (Subcutaneous Tissue): N/A N/A Yes Fascia: No Tendon: No Muscle: No Joint: No Bone: No Epithelialization: Small (1-33%) N/A N/A Treatment Notes Electronic Signature(s) Signed: 11/01/2021 1:44:34 PM By: Carlene Coria RN Entered By: Carlene Coria on 10/17/2021 10:06:18 Trostle, [name] ([id number]) -------------------------------------------------------------------------------- [place] Details Patient Name: [name], [name] Date of Service: 10/17/2021 9:30 AM Medical Record Number: [id number] Patient Account Number: [id number] Date of Birth/Sex: 1941/07/21 (80 y.o. F) Treating RN: Carlene Coria Primary Care : [name] Other Clinician: Referring : [name] Treating /Extender: Skipper Cliche in Treatment: 6 Active Inactive Wound/Skin  Impairment Nursing Diagnoses: Impaired tissue integrity Goals: Patient/caregiver will verbalize understanding of skin care regimen Date Initiated: 08/31/2021 Date Inactivated: 09/21/2021 Target Resolution Date: 08/31/2021 Goal Status: Met Ulcer/skin breakdown will have a volume reduction of 30% by week 4 Date Initiated: 08/31/2021 Date Inactivated: 10/17/2021 Target Resolution Date: 10/01/2021 Goal Status: Unmet Unmet Reason: comorbities Ulcer/skin breakdown will have a volume reduction of 50% by week 8 Date Initiated: 08/31/2021 Target Resolution Date: 10/31/2021 Goal Status: Active Ulcer/skin breakdown will have a volume reduction of 80% by week 12 Date Initiated: 08/31/2021 Target Resolution Date: 12/01/2021 Goal Status: Active Ulcer/skin breakdown will heal within 14 weeks Date Initiated: 08/31/2021 Target Resolution Date: 01/01/2022 Goal Status: Active Interventions: Assess patient/caregiver ability to obtain necessary supplies Assess patient/caregiver ability to perform ulcer/skin care regimen upon admission and as needed Assess ulceration(s) every visit Provide education on ulcer and skin care Treatment Activities: Referred to DME  for dressing supplies : 08/31/2021 Skin care regimen initiated : 08/31/2021 Notes: Electronic Signature(s) Signed: 11/01/2021 1:44:34 PM By: Carlene Coria RN Entered By: Carlene Coria on 10/17/2021 10:06:08 Hodgkins, [name] ([id number]) -------------------------------------------------------------------------------- Pain Assessment Details Patient Name: Dunkel, Kamariya L. Date of Service: 10/17/2021 9:30 AM Medical Record Number: [id number] Patient Account Number: [id number] Date of Birth/Sex: March 09, 1941 (80 y.o. F) Treating RN: Carlene Coria Primary Care : [name] Other Clinician: Referring : [name] Treating /Extender: Skipper Cliche in Treatment: 6 Active Problems Location of Pain Severity and  Description of Pain Patient Has Paino No Site Locations Pain Management and Medication Current Pain Management: Electronic Signature(s) Signed: 11/01/2021 1:44:34 PM By: Carlene Coria RN Entered By: Carlene Coria on 10/17/2021 09:48:42 [place], [name] ([id number]) -------------------------------------------------------------------------------- Patient/Caregiver Education Details Patient Name: Thrall, Beyonca L. Date of Service: 10/17/2021 9:30 AM Medical Record Number: [id number] Patient Account Number: [id number] Date of Birth/Gender: 12/13/1940 (80 y.o. F) Treating RN: Carlene Coria Primary Care Physician: [name] Other Clinician: Referring Physician: [name] Treating Physician/Extender: Skipper Cliche in Treatment: 6 Education Assessment Education Provided To: Patient Education Topics Provided Wound/Skin Impairment: Methods: Explain/Verbal Responses: State content correctly Electronic Signature(s) Signed: 11/01/2021 1:44:34 PM By: Carlene Coria RN Entered By: Carlene Coria on 10/17/2021 10:11:11 Hulsey, [name] ([id number]) -------------------------------------------------------------------------------- Wound Assessment Details Patient Name: Brunsman, Saragrace L. Date of Service: 10/17/2021 9:30 AM Medical Record Number: [id number] Patient Account Number: [id number] Date of Birth/Sex: 1941-06-12 (80 y.o. F) Treating RN: Carlene Coria Primary Care : [name] Other Clinician: Referring  N/A Area (cm) : 0.942 N/A N/A Volume (cm) : 0.565 N/A N/A % Reduction in Area: 25.10% N/A N/A % Reduction in Volume: 10.00% N/A N/A Classification: Full Thickness Without Exposed N/A N/A Support Structures Exudate Amount: Medium N/A N/A Exudate Type: Serosanguineous N/A N/A Exudate Color: red, brown N/A N/A Wound Margin: Flat and Intact N/A N/A Granulation Amount: Medium (34-66%) N/A N/A Granulation Quality: Red N/A N/A Necrotic Amount: Medium (34-66%) N/A N/A Exposed Structures: Fat Layer (Subcutaneous Tissue): N/A N/A Yes Fascia: No Tendon: No Muscle: No Joint: No Bone: No Epithelialization: Small (1-33%) N/A N/A Treatment Notes Electronic Signature(s) Signed: 11/01/2021 1:44:34 PM By: Carlene Coria RN Entered By: Carlene Coria on 10/17/2021 10:06:18 Trostle, [name] ([id number]) -------------------------------------------------------------------------------- [place] Details Patient Name: [name], [name] Date of Service: 10/17/2021 9:30 AM Medical Record Number: [id number] Patient Account Number: [id number] Date of Birth/Sex: 1941/07/21 (80 y.o. F) Treating RN: Carlene Coria Primary Care : [name] Other Clinician: Referring : [name] Treating /Extender: Skipper Cliche in Treatment: 6 Active Inactive Wound/Skin  Impairment Nursing Diagnoses: Impaired tissue integrity Goals: Patient/caregiver will verbalize understanding of skin care regimen Date Initiated: 08/31/2021 Date Inactivated: 09/21/2021 Target Resolution Date: 08/31/2021 Goal Status: Met Ulcer/skin breakdown will have a volume reduction of 30% by week 4 Date Initiated: 08/31/2021 Date Inactivated: 10/17/2021 Target Resolution Date: 10/01/2021 Goal Status: Unmet Unmet Reason: comorbities Ulcer/skin breakdown will have a volume reduction of 50% by week 8 Date Initiated: 08/31/2021 Target Resolution Date: 10/31/2021 Goal Status: Active Ulcer/skin breakdown will have a volume reduction of 80% by week 12 Date Initiated: 08/31/2021 Target Resolution Date: 12/01/2021 Goal Status: Active Ulcer/skin breakdown will heal within 14 weeks Date Initiated: 08/31/2021 Target Resolution Date: 01/01/2022 Goal Status: Active Interventions: Assess patient/caregiver ability to obtain necessary supplies Assess patient/caregiver ability to perform ulcer/skin care regimen upon admission and as needed Assess ulceration(s) every visit Provide education on ulcer and skin care Treatment Activities: Referred to DME  for dressing supplies : 08/31/2021 Skin care regimen initiated : 08/31/2021 Notes: Electronic Signature(s) Signed: 11/01/2021 1:44:34 PM By: Carlene Coria RN Entered By: Carlene Coria on 10/17/2021 10:06:08 Hodgkins, [name] ([id number]) -------------------------------------------------------------------------------- Pain Assessment Details Patient Name: Dunkel, Kamariya L. Date of Service: 10/17/2021 9:30 AM Medical Record Number: [id number] Patient Account Number: [id number] Date of Birth/Sex: March 09, 1941 (80 y.o. F) Treating RN: Carlene Coria Primary Care : [name] Other Clinician: Referring : [name] Treating /Extender: Skipper Cliche in Treatment: 6 Active Problems Location of Pain Severity and  Description of Pain Patient Has Paino No Site Locations Pain Management and Medication Current Pain Management: Electronic Signature(s) Signed: 11/01/2021 1:44:34 PM By: Carlene Coria RN Entered By: Carlene Coria on 10/17/2021 09:48:42 [place], [name] ([id number]) -------------------------------------------------------------------------------- Patient/Caregiver Education Details Patient Name: Thrall, Beyonca L. Date of Service: 10/17/2021 9:30 AM Medical Record Number: [id number] Patient Account Number: [id number] Date of Birth/Gender: 12/13/1940 (80 y.o. F) Treating RN: Carlene Coria Primary Care Physician: [name] Other Clinician: Referring Physician: [name] Treating Physician/Extender: Skipper Cliche in Treatment: 6 Education Assessment Education Provided To: Patient Education Topics Provided Wound/Skin Impairment: Methods: Explain/Verbal Responses: State content correctly Electronic Signature(s) Signed: 11/01/2021 1:44:34 PM By: Carlene Coria RN Entered By: Carlene Coria on 10/17/2021 10:11:11 Hulsey, [name] ([id number]) -------------------------------------------------------------------------------- Wound Assessment Details Patient Name: Brunsman, Saragrace L. Date of Service: 10/17/2021 9:30 AM Medical Record Number: [id number] Patient Account Number: [id number] Date of Birth/Sex: 1941-06-12 (80 y.o. F) Treating RN: Carlene Coria Primary Care : [name] Other Clinician: Referring  Tyric Rodeheaver: Duncan, Graham Treating Chantilly Linskey/Extender: Stone, Hoyt Weeks in Treatment: 6 Wound Status Wound Number: 1 Primary Etiology: Venous Leg Ulcer Wound Location: Left, Anterior Lower Leg Wound Status: Open Wounding Event: Skin Tear/Laceration Comorbid History: Cataracts, Osteoarthritis, Received Radiation Date Acquired: 07/30/2021 Weeks Of Treatment: 6 Clustered Wound: No Photos Wound Measurements Length: (cm) 2 Width: (cm) 0.6 Depth: (cm) 0.6 Area:  (cm) 0.942 Volume: (cm) 0.565 % Reduction in Area: 25.1% % Reduction in Volume: 10% Epithelialization: Small (1-33%) Tunneling: No Undermining: No Wound Description Classification: Full Thickness Without Exposed Support Structu Wound Margin: Flat and Intact Exudate Amount: Medium Exudate Type: Serosanguineous Exudate Color: red, brown res Foul Odor After Cleansing: No Slough/Fibrino Yes Wound Bed Granulation Amount: Medium (34-66%) Exposed Structure Granulation Quality: Red Fascia Exposed: No Necrotic Amount: Medium (34-66%) Fat Layer (Subcutaneous Tissue) Exposed: Yes Necrotic Quality: Adherent Slough Tendon Exposed: No Muscle Exposed: No Joint Exposed: No Bone Exposed: No Electronic Signature(s) Signed: 11/01/2021 1:44:34 PM By: Epps, Carrie RN Entered By: Epps, Carrie on 10/17/2021 09:52:39 Piercey, Shetara L. ([id number]) -------------------------------------------------------------------------------- [place] Details Patient Name: Brisby, Fredonia L. Date of Service: 10/17/2021 9:30 AM Medical Record Number: [id number] Patient Account Number: [id number] Date of Birth/Sex: 06/09/1941 (80 y.o. F) Treating RN: Epps, Carrie Primary Care Neko Boyajian: Duncan, Graham Other Clinician: Referring Haralambos Yeatts: Duncan, Graham Treating Lamorris Knoblock/Extender: Stone, Hoyt Weeks in Treatment: 6 Vital Signs Time Taken: 09:48 Temperature (°F): 97.9 Height (in): 63 Pulse (bpm): 73 Weight (lbs): 198 Respiratory Rate (breaths/min): 18 Body Mass Index (BMI): 35.1 Blood Pressure (mmHg): 182/105 Reference Range: 80 - 120 mg / dl Electronic Signature(s) Signed: 11/01/2021 1:44:34 PM By: Epps, Carrie RN Entered By: Epps, Carrie on 10/17/2021 09:48:35   N/A Area (cm) : 0.942 N/A N/A Volume (cm) : 0.565 N/A N/A % Reduction in Area: 25.10% N/A N/A % Reduction in Volume: 10.00% N/A N/A Classification: Full Thickness Without Exposed N/A N/A Support Structures Exudate Amount: Medium N/A N/A Exudate Type: Serosanguineous N/A N/A Exudate Color: red, brown N/A N/A Wound Margin: Flat and Intact N/A N/A Granulation Amount: Medium (34-66%) N/A N/A Granulation Quality: Red N/A N/A Necrotic Amount: Medium (34-66%) N/A N/A Exposed Structures: Fat Layer (Subcutaneous Tissue): N/A N/A Yes Fascia: No Tendon: No Muscle: No Joint: No Bone: No Epithelialization: Small (1-33%) N/A N/A Treatment Notes Electronic Signature(s) Signed: 11/01/2021 1:44:34 PM By: Carlene Coria RN Entered By: Carlene Coria on 10/17/2021 10:06:18 Trostle, [name] ([id number]) -------------------------------------------------------------------------------- [place] Details Patient Name: [name], [name] Date of Service: 10/17/2021 9:30 AM Medical Record Number: [id number] Patient Account Number: [id number] Date of Birth/Sex: 1941/07/21 (80 y.o. F) Treating RN: Carlene Coria Primary Care : [name] Other Clinician: Referring : [name] Treating /Extender: Skipper Cliche in Treatment: 6 Active Inactive Wound/Skin  Impairment Nursing Diagnoses: Impaired tissue integrity Goals: Patient/caregiver will verbalize understanding of skin care regimen Date Initiated: 08/31/2021 Date Inactivated: 09/21/2021 Target Resolution Date: 08/31/2021 Goal Status: Met Ulcer/skin breakdown will have a volume reduction of 30% by week 4 Date Initiated: 08/31/2021 Date Inactivated: 10/17/2021 Target Resolution Date: 10/01/2021 Goal Status: Unmet Unmet Reason: comorbities Ulcer/skin breakdown will have a volume reduction of 50% by week 8 Date Initiated: 08/31/2021 Target Resolution Date: 10/31/2021 Goal Status: Active Ulcer/skin breakdown will have a volume reduction of 80% by week 12 Date Initiated: 08/31/2021 Target Resolution Date: 12/01/2021 Goal Status: Active Ulcer/skin breakdown will heal within 14 weeks Date Initiated: 08/31/2021 Target Resolution Date: 01/01/2022 Goal Status: Active Interventions: Assess patient/caregiver ability to obtain necessary supplies Assess patient/caregiver ability to perform ulcer/skin care regimen upon admission and as needed Assess ulceration(s) every visit Provide education on ulcer and skin care Treatment Activities: Referred to DME  for dressing supplies : 08/31/2021 Skin care regimen initiated : 08/31/2021 Notes: Electronic Signature(s) Signed: 11/01/2021 1:44:34 PM By: Carlene Coria RN Entered By: Carlene Coria on 10/17/2021 10:06:08 Hodgkins, [name] ([id number]) -------------------------------------------------------------------------------- Pain Assessment Details Patient Name: Dunkel, Kamariya L. Date of Service: 10/17/2021 9:30 AM Medical Record Number: [id number] Patient Account Number: [id number] Date of Birth/Sex: March 09, 1941 (80 y.o. F) Treating RN: Carlene Coria Primary Care : [name] Other Clinician: Referring : [name] Treating /Extender: Skipper Cliche in Treatment: 6 Active Problems Location of Pain Severity and  Description of Pain Patient Has Paino No Site Locations Pain Management and Medication Current Pain Management: Electronic Signature(s) Signed: 11/01/2021 1:44:34 PM By: Carlene Coria RN Entered By: Carlene Coria on 10/17/2021 09:48:42 [place], [name] ([id number]) -------------------------------------------------------------------------------- Patient/Caregiver Education Details Patient Name: Thrall, Beyonca L. Date of Service: 10/17/2021 9:30 AM Medical Record Number: [id number] Patient Account Number: [id number] Date of Birth/Gender: 12/13/1940 (80 y.o. F) Treating RN: Carlene Coria Primary Care Physician: [name] Other Clinician: Referring Physician: [name] Treating Physician/Extender: Skipper Cliche in Treatment: 6 Education Assessment Education Provided To: Patient Education Topics Provided Wound/Skin Impairment: Methods: Explain/Verbal Responses: State content correctly Electronic Signature(s) Signed: 11/01/2021 1:44:34 PM By: Carlene Coria RN Entered By: Carlene Coria on 10/17/2021 10:11:11 Hulsey, [name] ([id number]) -------------------------------------------------------------------------------- Wound Assessment Details Patient Name: Brunsman, Saragrace L. Date of Service: 10/17/2021 9:30 AM Medical Record Number: [id number] Patient Account Number: [id number] Date of Birth/Sex: 1941-06-12 (80 y.o. F) Treating RN: Carlene Coria Primary Care : [name] Other Clinician: Referring

## 2021-11-03 ENCOUNTER — Encounter: Payer: PPO | Admitting: Physician Assistant

## 2021-11-03 ENCOUNTER — Other Ambulatory Visit: Payer: Self-pay

## 2021-11-03 DIAGNOSIS — L97822 Non-pressure chronic ulcer of other part of left lower leg with fat layer exposed: Secondary | ICD-10-CM | POA: Diagnosis not present

## 2021-11-03 DIAGNOSIS — I872 Venous insufficiency (chronic) (peripheral): Secondary | ICD-10-CM | POA: Diagnosis not present

## 2021-11-03 DIAGNOSIS — M797 Fibromyalgia: Secondary | ICD-10-CM | POA: Diagnosis not present

## 2021-11-03 NOTE — Progress Notes (Addendum)
MUSKAAN, SMET ([id number]) Visit Report for 11/03/2021 Chief Complaint Document Details Patient Name: [name], [name]. Date of Service: 11/03/2021 11:00 AM Medical Record Number: [id number] Patient Account Number: [id number] Date of Birth/Sex: 1941-07-25 (80 y.o. F) Treating RN: Carlene Coria Primary Care Provider: [name] Other Clinician: Referring Provider: [name] Treating Provider/Extender: Skipper Cliche in Treatment: 9 Information Obtained from: Patient Chief Complaint Left LE Ulcer Electronic Signature(s) Signed: 11/03/2021 11:26:40 AM By: Worthy Keeler PA-C Entered By: Worthy Keeler on 11/03/2021 11:26:40 Mathenia, [name] ([id number]) -------------------------------------------------------------------------------- HPI Details Patient Name: [name], [name]. Date of Service: 11/03/2021 11:00 AM Medical Record Number: [id number] Patient Account Number: [id number] Date of Birth/Sex: 1941-05-19 (80 y.o. F) Treating RN: Carlene Coria Primary Care Provider: [name] Other Clinician: Referring Provider: [name] Treating Provider/Extender: Skipper Cliche in Treatment: 9 History of Present Illness HPI Description: 08/31/2021 upon evaluation today patient presents for initial inspection here in our clinic concerning issues that she is having with a wound on her left anterior lower extremity. This is actually an area that she unfortunately tells me she was picking up a cage and dropped hitting the anterior portion of her shin causing a skin tear. She did not go to the hospital due to the fact that she did not realize that she would need to. This happened on July 30, 2021. With that being said the patient has been on Augmentin she has about 1 and half days of this left. Nonetheless she tells me that she does have a longstanding history of leg swelling. She does have chronic venous insufficiency as well it appears to me. She also has some  evidence of may be early stages of lymphedema. She has a history of fibromyalgia, bilateral knee osteoarthritis, and appears to have a fungal infection of the right ankle region. In regard to the arthritis she tells me her legs tend to swell less after she gets her knee injections allowing her to be able to walk and get around much more effectively. Following the injections she does well for a while and then when that seems to be wearing off she tends to start having issues with more swelling because she is more mobile that makes sense from a venous standpoint. 10/10; I was asked to see this patient today who came in for a nurse visit. Firstly she has developed a widespread very pruritic rash involving her abdomen and back. Lesser extent on her feet. The second problem is that she is complaining about the tightness of the wound and wrap on the left leg where the wound is. 09/14/2021 upon evaluation today patient appears to be doing decently well in regard to her leg ulcer. The biggest issue she is having is a significant issue with a rash currently. I do not see any signs of active infection systemically which is great news although with regard to the rash she did see dermatology they told her she had eczema that does not seem to sit right with me out think that is mainly the issues she has going on. I do think she may benefit from the use of a short course of prednisone which should help with the rash which is absolutely driving her crazy. 09/21/2021 upon evaluation today patient appears to be doing decently well in regard to her wound this is still pretty deep. The prednisone has helped with the rash although is not completely done and it is doing much better than what it was. Fortunately there is  no signs of active infection systemically at this time also great news. 09/28/2021 upon evaluation today patient's wound on her leg actually showing some signs of improvement which is good news I am happy  in that regard. Fortunately there does not appear to be any evidence of active infection systemically which is great news. Unfortunately she is still continuing to have issues with her rash which is almost worse than the actual wound itself for her at this point. 10/05/2021 upon evaluation today patient appears to be doing well with regard to her leg ulcer. She is tolerating the dressing changes without complication and overall I am extremely pleased with where we stand. With that being said I do think that she could possibly benefit from a snap VAC if we can gain approval I am not certain if this will be improved or not. Nonetheless I definitely think we can give this a try and if we get approval I think this will help speed up the healing process to be honest. In the interim I do believe the [facility] is doing decently well. 10/10/2021 upon evaluation today patient appears to be doing well with regard to her wound although is not significantly smaller. We still have quite a bit of depth here. Fortunately there is no signs of active infection systemically at this time. No fevers, chills, nausea, vomiting, or diarrhea. 10/17/2021 upon evaluation today patient appears to be doing poorly in regard to her leg compared to last time there is a lot of irritation today. She actually kept the Zetuvit on all week which does not seem to have been beneficial for her. She was supposed to change it at home we did order her supplies but she did not even find the supplies on her porch until she tells me yesterday. Either way we had already decided that the best thing was probably can to be to have her come in for nurse visits. I think that still probably the best thing to do I feel like there is a bit of dementia here that I was not aware of last week or even the previous visit to be honest that is going to prohibit her from being able to make these dressing changes herself. 10/27/2021 upon evaluation today  patient appears to be doing well with regard to her wound currently. She is actually showing signs of excellent improvement and I do feel like the collagen is doing a great job. I am extremely pleased with where we stand today. 11/03/2021 upon evaluation patient's wound bed actually showed signs of good granulation epithelization at this point. I am actually extremely pleased with where we stand and how this is progressing I think the patient is making great progress. She is very happy with today's findings. Electronic Signature(s) Signed: 11/03/2021 5:42:37 PM By: Worthy Keeler PA-C Entered By: Worthy Keeler on 11/03/2021 17:42:37 Naas, [name] ([id number]) -------------------------------------------------------------------------------- Physical Exam Details Patient Name: Taillon, Fletcher [name]. Date of Service: 11/03/2021 11:00 AM Medical Record Number: [id number] Patient Account Number: [id number] Date of Birth/Sex: Dec 23, 1940 (80 y.o. F) Treating RN: Carlene Coria Primary Care Provider: [name] Other Clinician: Referring Provider: [name] Treating Provider/Extender: Skipper Cliche in Treatment: 48 Constitutional Well-nourished and well-hydrated in no acute distress. Respiratory normal breathing without difficulty. Psychiatric this patient is able to make decisions and demonstrates good insight into disease process. Alert and Oriented x 3. pleasant and cooperative. Notes Patient's wound bed showed signs of good granulation and epithelization at this point. Fortunately  I do not see any signs of active infection locally nor systemically and I think that there current dressings with the collagen is doing awesome for her. Electronic Signature(s) Signed: 11/03/2021 5:43:00 PM By: Worthy Keeler PA-C Entered By: Worthy Keeler on 11/03/2021 17:43:00 Kindley, [name] ([id number]) -------------------------------------------------------------------------------- Physician  Orders Details Patient Name: Bossler, Kamilla [name]. Date of Service: 11/03/2021 11:00 AM Medical Record Number: [id number] Patient Account Number: [id number] Date of Birth/Sex: [birth year] (80 y.o. F) Treating RN: Carlene Coria Primary Care Provider: [name] Other Clinician: Referring Provider: [name] Treating Provider/Extender: Skipper Cliche in Treatment: 9 Verbal / Phone Orders: No Diagnosis Coding ICD-10 Coding Code Description I87.2 Venous insufficiency (chronic) (peripheral) L97.822 Non-pressure chronic ulcer of other part of left lower leg with fat layer exposed M79.7 Fibromyalgia M17.0 Bilateral primary osteoarthritis of knee B35.4 Tinea corporis R21 Rash and other nonspecific skin eruption Follow-up Appointments o Return Appointment in 1 week. - once a week provider o Nurse Visit as needed - once per week Bathing/ Shower/ Hygiene o May shower with wound dressing protected with water repellent cover or cast protector. o No tub bath. Anesthetic (Use 'Patient Medications' Section for Anesthetic Order Entry) o Lidocaine applied to wound bed Edema Control - Lymphedema / Segmental Compressive Device / Other o Tubigrip single layer applied. - E o Elevate, Exercise Daily and Avoid Standing for Long Periods of Time. o Elevate legs to the level of the heart and pump ankles as often as possible o Elevate leg(s) parallel to the floor when sitting. Medications-Please add to medication list. o Other: - finish prednisone as prescribed Try Allegra, Claritin or Zyrtec for allergic skin reactions Wound Treatment Wound #1 - Lower Leg Wound Laterality: Left, Anterior Cleanser: Byram Ancillary Kit - 15 Day Supply (Generic) 2 x Per Week/30 Days Discharge Instructions: Use supplies as instructed; Kit contains: (15) Saline Bullets; (15) 3x3 Gauze; 15 pr Gloves Cleanser: Soap and Water 2 x Per Week/30 Days Discharge Instructions: Gently cleanse wound with  antibacterial soap, rinse and pat dry prior to dressing wounds Topical: Triamcinolone Acetonide Cream, 0.1%, 15 (g) tube 2 x Per Week/30 Days Discharge Instructions: Apply as directed by provider. Primary Dressing: Endoform Antimicrobial, 2x2 (in/in) 2 x Per Week/30 Days Secondary Dressing: Zetuvit Plus Silicone Border Dressing 4x4 (in/in) (Generic) 2 x Per Week/30 Days Compression Wrap: Tubi E 2 x Per Week/30 Days Electronic Signature(s) Signed: 11/03/2021 6:06:35 PM By: Worthy Keeler PA-C Signed: 11/06/2021 12:20:53 PM By: Carlene Coria RN Pascual, Trenton LMarland Kitchen ([id number]) Entered By: Carlene Coria on 11/03/2021 11:34:45 Mcdaris, [name] ([id number]) -------------------------------------------------------------------------------- Problem List Details Patient Name: [name], [name]. Date of Service: 11/03/2021 11:00 AM Medical Record Number: [id number] Patient Account Number: [id number] Date of Birth/Sex: [birth year] (80 y.o. F) Treating RN: Carlene Coria Primary Care Provider: [name] Other Clinician: Referring Provider: [name] Treating Provider/Extender: Skipper Cliche in Treatment: 9 Active Problems ICD-10 Encounter Code Description Active Date MDM Diagnosis I87.2 Venous insufficiency (chronic) (peripheral) 08/31/2021 No Yes L97.822 Non-pressure chronic ulcer of other part of left lower leg with fat layer 08/31/2021 No Yes exposed M79.7 Fibromyalgia 08/31/2021 No Yes M17.0 Bilateral primary osteoarthritis of knee 08/31/2021 No Yes B35.4 Tinea corporis 08/31/2021 No Yes R21 Rash and other nonspecific skin eruption 09/04/2021 No Yes Inactive Problems Resolved Problems Electronic Signature(s) Signed: 11/03/2021 11:26:34 AM By: Worthy Keeler PA-C Entered By: Worthy Keeler on 11/03/2021 11:26:34 Marlowe, [name] ([id number]) -------------------------------------------------------------------------------- Progress Note Details Patient Name: [name], [name]  [name]. Date  of Service: 11/03/2021 11:00 AM Medical Record Number: [id number] Patient Account Number: [id number] Date of Birth/Sex: Jan 22, 1941 (80 y.o. F) Treating RN: Carlene Coria Primary Care Provider: [name] Other Clinician: Referring Provider: [name] Treating Provider/Extender: Skipper Cliche in Treatment: 9 Subjective Chief Complaint Information obtained from Patient Left LE Ulcer History of Present Illness (HPI) 08/31/2021 upon evaluation today patient presents for initial inspection here in our clinic concerning issues that she is having with a wound on her left anterior lower extremity. This is actually an area that she unfortunately tells me she was picking up a cage and dropped hitting the anterior portion of her shin causing a skin tear. She did not go to the hospital due to the fact that she did not realize that she would need to. This happened on July 30, 2021. With that being said the patient has been on Augmentin she has about 1 and half days of this left. Nonetheless she tells me that she does have a longstanding history of leg swelling. She does have chronic venous insufficiency as well it appears to me. She also has some evidence of may be early stages of lymphedema. She has a history of fibromyalgia, bilateral knee osteoarthritis, and appears to have a fungal infection of the right ankle region. In regard to the arthritis she tells me her legs tend to swell less after she gets her knee injections allowing her to be able to walk and get around much more effectively. Following the injections she does well for a while and then when that seems to be wearing off she tends to start having issues with more swelling because she is more mobile that makes sense from a venous standpoint. 10/10; I was asked to see this patient today who came in for a nurse visit. Firstly she has developed a widespread very pruritic rash involving her abdomen and back. Lesser extent on  her feet. The second problem is that she is complaining about the tightness of the wound and wrap on the left leg where the wound is. 09/14/2021 upon evaluation today patient appears to be doing decently well in regard to her leg ulcer. The biggest issue she is having is a significant issue with a rash currently. I do not see any signs of active infection systemically which is great news although with regard to the rash she did see dermatology they told her she had eczema that does not seem to sit right with me out think that is mainly the issues she has going on. I do think she may benefit from the use of a short course of prednisone which should help with the rash which is absolutely driving her crazy. 09/21/2021 upon evaluation today patient appears to be doing decently well in regard to her wound this is still pretty deep. The prednisone has helped with the rash although is not completely done and it is doing much better than what it was. Fortunately there is no signs of active infection systemically at this time also great news. 09/28/2021 upon evaluation today patient's wound on her leg actually showing some signs of improvement which is good news I am happy in that regard. Fortunately there does not appear to be any evidence of active infection systemically which is great news. Unfortunately she is still continuing to have issues with her rash which is almost worse than the actual wound itself for her at this point. 10/05/2021 upon evaluation today patient appears to be doing well with regard to her  leg ulcer. She is tolerating the dressing changes without complication and overall I am extremely pleased with where we stand. With that being said I do think that she could possibly benefit from a snap VAC if we can gain approval I am not certain if this will be improved or not. Nonetheless I definitely think we can give this a try and if we get approval I think this will help speed up the healing  process to be honest. In the interim I do believe the [facility] is doing decently well. 10/10/2021 upon evaluation today patient appears to be doing well with regard to her wound although is not significantly smaller. We still have quite a bit of depth here. Fortunately there is no signs of active infection systemically at this time. No fevers, chills, nausea, vomiting, or diarrhea. 10/17/2021 upon evaluation today patient appears to be doing poorly in regard to her leg compared to last time there is a lot of irritation today. She actually kept the Zetuvit on all week which does not seem to have been beneficial for her. She was supposed to change it at home we did order her supplies but she did not even find the supplies on her porch until she tells me yesterday. Either way we had already decided that the best thing was probably can to be to have her come in for nurse visits. I think that still probably the best thing to do I feel like there is a bit of dementia here that I was not aware of last week or even the previous visit to be honest that is going to prohibit her from being able to make these dressing changes herself. 10/27/2021 upon evaluation today patient appears to be doing well with regard to her wound currently. She is actually showing signs of excellent improvement and I do feel like the collagen is doing a great job. I am extremely pleased with where we stand today. 11/03/2021 upon evaluation patient's wound bed actually showed signs of good granulation epithelization at this point. I am actually extremely pleased with where we stand and how this is progressing I think the patient is making great progress. She is very happy with today's findings. Objective Mccolgan, Josslin [name]. ([id number]) Constitutional Well-nourished and well-hydrated in no acute distress. Vitals Time Taken: 11:14 AM, Height: 63 in, Weight: 198 lbs, BMI: 35.1, Temperature: 97.9 F, Pulse: 77 bpm, Respiratory Rate:  18 breaths/min, Blood Pressure: 181/107 mmHg. General Notes: notified Jeri Cos PA of blood pressure , patient denies headache, blurred vision, ringing of ears Respiratory normal breathing without difficulty. Psychiatric this patient is able to make decisions and demonstrates good insight into disease process. Alert and Oriented x 3. pleasant and cooperative. General Notes: Patient's wound bed showed signs of good granulation and epithelization at this point. Fortunately I do not see any signs of active infection locally nor systemically and I think that there current dressings with the collagen is doing awesome for her. Integumentary (Hair, Skin) Wound #1 status is Open. Original cause of wound was Skin Tear/Laceration. The date acquired was: 07/30/2021. The wound has been in treatment 9 weeks. The wound is located on the Left,Anterior Lower Leg. The wound measures 1.7cm length x 0.5cm width x 0.4cm depth; 0.668cm^2 area and 0.267cm^3 volume. There is Fat Layer (Subcutaneous Tissue) exposed. There is no tunneling or undermining noted. There is a medium amount of serosanguineous drainage noted. The wound margin is flat and intact. There is medium (34-66%) red granulation within the  wound bed. There is a medium (34-66%) amount of necrotic tissue within the wound bed including Adherent Slough. Assessment Active Problems ICD-10 Venous insufficiency (chronic) (peripheral) Non-pressure chronic ulcer of other part of left lower leg with fat layer exposed Fibromyalgia Bilateral primary osteoarthritis of knee Tinea corporis Rash and other nonspecific skin eruption Plan Follow-up Appointments: Return Appointment in 1 week. - once a week provider Nurse Visit as needed - once per week Bathing/ Shower/ Hygiene: May shower with wound dressing protected with water repellent cover or cast protector. No tub bath. Anesthetic (Use 'Patient Medications' Section for Anesthetic Order Entry): Lidocaine  applied to wound bed Edema Control - Lymphedema / Segmental Compressive Device / Other: Tubigrip single layer applied. - E Elevate, Exercise Daily and Avoid Standing for Long Periods of Time. Elevate legs to the level of the heart and pump ankles as often as possible Elevate leg(s) parallel to the floor when sitting. Medications-Please add to medication list.: Other: - finish prednisone as prescribed Try Allegra, Claritin or Zyrtec for allergic skin reactions WOUND #1: - Lower Leg Wound Laterality: Left, Anterior Cleanser: Byram Ancillary Kit - 15 Day Supply (Generic) 2 x Per Week/30 Days Discharge Instructions: Use supplies as instructed; Kit contains: (15) Saline Bullets; (15) 3x3 Gauze; 15 pr Gloves Cleanser: Soap and Water 2 x Per Week/30 Days Discharge Instructions: Gently cleanse wound with antibacterial soap, rinse and pat dry prior to dressing wounds Topical: Triamcinolone Acetonide Cream, 0.1%, 15 (g) tube 2 x Per Week/30 Days Discharge Instructions: Apply as directed by provider. Primary Dressing: Endoform Antimicrobial, 2x2 (in/in) 2 x Per Week/30 Days Secondary Dressing: Zetuvit Plus Silicone Border Dressing 4x4 (in/in) (Generic) 2 x Per Week/30 Days Compression Wrap: Tubi E 2 x Per Week/30 Days Falconi, Casara [name]. ([id number]) 1. I am also going to suggest that we go ahead and continue with the wound care measures as before and the patient is in agreement with plan this includes the use of the endoform which I think is doing an awesome job. 2. I am also can recommend that we have the patient continue with the Zetuvit border foam dressing which is doing great for her. We will see patient back for reevaluation in 1 week here in the clinic. If anything worsens or changes patient will contact our office for additional recommendations. Electronic Signature(s) Signed: 11/03/2021 5:43:35 PM By: Worthy Keeler PA-C Entered By: Worthy Keeler on 11/03/2021 17:43:34 Forst, [name]  ([id number]) -------------------------------------------------------------------------------- SuperBill Details Patient Name: [name], [name]. Date of Service: 11/03/2021 Medical Record Number: [id number] Patient Account Number: [id number] Date of Birth/Sex: February 03, 1941 (80 y.o. F) Treating RN: Carlene Coria Primary Care Provider: [name] Other Clinician: Referring Provider: [name] Treating Provider/Extender: Skipper Cliche in Treatment: 9 Diagnosis Coding ICD-10 Codes Code Description I87.2 Venous insufficiency (chronic) (peripheral) L97.822 Non-pressure chronic ulcer of other part of left lower leg with fat layer exposed M79.7 Fibromyalgia M17.0 Bilateral primary osteoarthritis of knee B35.4 Tinea corporis R21 Rash and other nonspecific skin eruption Facility Procedures CPT4 Code: [id number] Description: 99213 - WOUND CARE VISIT-LEV 3 EST PT Modifier: Quantity: 1 Physician Procedures CPT4 Code: [id number] Description: 89211 - WC PHYS LEVEL 3 - EST PT Modifier: Quantity: 1 CPT4 Code: Description: ICD-10 Diagnosis Description I87.2 Venous insufficiency (chronic) (peripheral) L97.822 Non-pressure chronic ulcer of other part of left lower leg with fat lay M79.7 Fibromyalgia M17.0 Bilateral primary osteoarthritis of knee Modifier: er exposed Quantity: Electronic Signature(s) Signed: 11/03/2021 5:45:34 PM By: Worthy Keeler PA-C Entered By:  Worthy Keeler on 11/03/2021 17:45:34

## 2021-11-06 ENCOUNTER — Ambulatory Visit (INDEPENDENT_AMBULATORY_CARE_PROVIDER_SITE_OTHER): Payer: PPO | Admitting: Family Medicine

## 2021-11-06 ENCOUNTER — Encounter: Payer: Self-pay | Admitting: Family Medicine

## 2021-11-06 ENCOUNTER — Other Ambulatory Visit: Payer: Self-pay

## 2021-11-06 ENCOUNTER — Ambulatory Visit: Payer: PPO

## 2021-11-06 VITALS — BP 144/86 | HR 77 | Temp 97.4°F | Ht 64.0 in | Wt 194.4 lb

## 2021-11-06 DIAGNOSIS — M1711 Unilateral primary osteoarthritis, right knee: Secondary | ICD-10-CM | POA: Diagnosis not present

## 2021-11-06 MED ORDER — TRIAMCINOLONE ACETONIDE 40 MG/ML IJ SUSP
40.0000 mg | Freq: Once | INTRAMUSCULAR | Status: AC
  Administered 2021-11-06: 40 mg via INTRA_ARTICULAR

## 2021-11-06 NOTE — Progress Notes (Signed)
Farmer Mccahill T. Rukia Mcgillivray, MD, CAQ Sports Medicine [facility] [address] [place], [address]  Phone: [phone]  FAX: [phone]  [name] - 80 y.o. female  MRN [id number]  Date of Birth: [birth year]  Date: 11/06/2021  PCP: Joaquim Nam, MD  Referral: Joaquim Nam, MD  Chief Complaint  Patient presents with   Knee Pain    Requesting Bilateral Knee Injection    This visit occurred during the SARS-CoV-2 public health emergency.  Safety protocols were in place, including screening questions prior to the visit, additional usage of staff PPE, and extensive cleaning of exam room while observing appropriate contact time as indicated for disinfecting solutions.   Subjective:   [name] is a 80 y.o. very pleasant female patient with Body mass index is 33.36 kg/m. who presents with the following:  She is currently seeing wound care for management of a complex wound that is been present and progressing since September.  She actually had a wound VAC for a while, and she last saw wound care 3 days ago.    Inject R knee today.  Will hold until the L leg is healing.   I looked at her left wound myself, and this is very small at this point, and there is evidence of excellent healing throughout and surrounding it.  I do think it safe enough to do a right-sided knee injection today.  I have thought from years that she would do better with a total knee arthroplasty, and at this point she is quite impaired and she is open and will be thinking about this with a much greater consideration that in the past.  Her x-rays are quite severe, and her clinical motion has been bad for a very long time.  Aspiration/Injection Procedure Note [name] 1941-03-08 Date of procedure: 11/06/2021  Procedure: Large Joint Aspiration / Injection of Knee, R Indications: Pain  Procedure Details Patient verbally consented to procedure. Risks,  benefits, and alternatives explained. Sterilely prepped with Chloraprep. Ethyl cholride used for anesthesia. 9 cc Lidocaine 1% mixed with 1 mL of Kenalog 40 mg injected using the anteromedial approach without difficulty. No complications with procedure and tolerated well. Patient had decreased pain post-injection. Medication: 1 mL of Kenalog 40 mg     ICD-10-CM   1. Primary osteoarthritis of right knee  M17.11 triamcinolone acetonide (KENALOG-40) injection 40 mg      Meds ordered this encounter  Medications   triamcinolone acetonide (KENALOG-40) injection 40 mg

## 2021-11-06 NOTE — Progress Notes (Signed)
[name], [name] ([id number]) Visit Report for 11/03/2021 Arrival Information Details Patient Name: [name], [name]. Date of Service: 11/03/2021 11:00 AM Medical Record Number: [id number] Patient Account Number: [id number] Date of Birth/Sex: March 04, 1941 (80 y.o. F) Treating RN: Carlene Coria Primary Care Jalie Eiland: [name] Other Clinician: Referring Cali Hope: [name] Treating Nekesha Font/Extender: Skipper Cliche in Treatment: 9 Visit Information History Since Last Visit All ordered tests and consults were completed: No Patient Arrived: [name] Added or deleted any medications: No Arrival Time: 11:09 Any new allergies or adverse reactions: No Accompanied By: self Had a fall or experienced change in No Transfer Assistance: None activities of daily living that may affect Patient Identification Verified: Yes risk of falls: Secondary Verification Process Completed: Yes Signs or symptoms of abuse/neglect since last visito No Patient Requires Transmission-Based Precautions: No Hospitalized since last visit: No Patient Has Alerts: No Implantable device outside of the clinic excluding No cellular tissue based products placed in the center since last visit: Has Dressing in Place as Prescribed: Yes Has Compression in Place as Prescribed: Yes Pain Present Now: No Electronic Signature(s) Signed: 11/06/2021 12:20:53 PM By: Carlene Coria RN Entered By: Carlene Coria on 11/03/2021 11:14:36 Bob, [name] ([id number]) -------------------------------------------------------------------------------- Clinic Level of Care Assessment Details Patient Name: [name], [name] Date of Service: 11/03/2021 11:00 AM Medical Record Number: [id number] Patient Account Number: [id number] Date of Birth/Sex: Aug 27, 1941 (80 y.o. F) Treating RN: Carlene Coria Primary Care Anahli Arvanitis: [name] Other Clinician: Referring Jiovani Mccammon: [name] Treating Khalen Styer/Extender: Skipper Cliche in Treatment: 9 Clinic Level of Care Assessment Items TOOL 4 Quantity Score X - Use when only an EandM is performed on FOLLOW-UP visit 1 0 ASSESSMENTS - Nursing Assessment / Reassessment X - Reassessment of Co-morbidities (includes updates in patient status) 1 10 X- 1 5 Reassessment of Adherence to Treatment Plan ASSESSMENTS - Wound and Skin Assessment / Reassessment X - Simple Wound Assessment / Reassessment - one wound 1 5 _0  - 0 Complex Wound Assessment / Reassessment - multiple wounds _1  - 0 Dermatologic / Skin Assessment (not related to wound area) ASSESSMENTS - Focused Assessment _2  - Circumferential Edema Measurements - multi extremities 0 _3  - 0 Nutritional Assessment / Counseling / Intervention _4  - 0 Lower Extremity Assessment (monofilament, tuning fork, pulses) _5  - 0 Peripheral Arterial Disease Assessment (using hand held doppler) ASSESSMENTS - Ostomy and/or Continence Assessment and Care _6  - Incontinence Assessment and Management 0 _7  - 0 Ostomy Care Assessment and Management (repouching, etc.) PROCESS - Coordination of Care X - Simple Patient / Family Education for ongoing care 1 15 _8  - 0 Complex (extensive) Patient / Family Education for ongoing care _9  - 0 Staff obtains Programmer, systems, Records, Test Results / Process Orders _10  - 0 Staff telephones HHA, Nursing Homes / Clarify orders / etc _11  - 0 Routine Transfer to another Facility (non-emergent condition) _12  - 0 Routine Hospital Admission (non-emergent condition) _13  - 0 New Admissions / Biomedical engineer / Ordering NPWT, Apligraf, etc. _14  - 0 Emergency Hospital Admission (emergent condition) X- 1 10 Simple Discharge Coordination _15  - 0 Complex (extensive) Discharge Coordination PROCESS - Special Needs _16  - Pediatric / Minor Patient Management 0 _17  - 0 Isolation Patient Management _18  - 0 Hearing / Language / Visual special needs _19  - 0 Assessment of Community assistance  (transportation, D/C planning, etc.) _20  - 0 Additional assistance / Altered mentation _21  - 0 Support Surface(s) Assessment (bed, cushion, seat, etc.) INTERVENTIONS - Wound Cleansing / Measurement Cermak, Chana L. ([id number])  Care Lawson Mahone: [name] Other Clinician: Referring Cayson Kalb: [name] Treating Nassir Neidert/Extender: Jeri Cos Weeks in Treatment: 9 Wound Status Wound Number: 1 Primary Etiology: Venous Leg Ulcer Wound Location: Left, Anterior Lower Leg Wound Status: Open Wounding Event: Skin Tear/Laceration Comorbid History: Cataracts, Osteoarthritis, Received Radiation Date Acquired: 07/30/2021 Weeks Of Treatment: 9 Clustered Wound: No Photos Wound  Measurements Length: (cm) 1.7 Width: (cm) 0.5 Depth: (cm) 0.4 Area: (cm) 0.668 Volume: (cm) 0.267 % Reduction in Area: 46.9% % Reduction in Volume: 57.5% Epithelialization: Small (1-33%) Tunneling: No Undermining: No Wound Description Classification: Full Thickness Without Exposed Support Structures Wound Margin: Flat and Intact Exudate Amount: Medium Exudate Type: Serosanguineous Exudate Color: red, brown Foul Odor After Cleansing: No Slough/Fibrino Yes Wound Bed Granulation Amount: Medium (34-66%) Exposed Structure Granulation Quality: Red Fascia Exposed: No Necrotic Amount: Medium (34-66%) Fat Layer (Subcutaneous Tissue) Exposed: Yes Necrotic Quality: Adherent Slough Tendon Exposed: No Muscle Exposed: No Joint Exposed: No Bone Exposed: No Treatment Notes Wound #1 (Lower Leg) Wound Laterality: Left, Anterior Cleanser Byram Ancillary Kit - 15 Day Supply Discharge Instruction: Use supplies as instructed; Kit contains: (15) Saline Bullets; (15) 3x3 Gauze; 15 pr Gloves Soap and Water Parisi, Shelvie L. ([id number]) Discharge Instruction: Gently cleanse wound with antibacterial soap, rinse and pat dry prior to dressing wounds Peri-Wound Care Topical Triamcinolone Acetonide Cream, 0.1%, 15 (g) tube Discharge Instruction: Apply as directed by Carleen Rhue. Primary Dressing Endoform Antimicrobial, 2x2 (in/in) Secondary Dressing Zetuvit Plus Silicone Border Dressing 4x4 (in/in) Secured With Compression Wrap Tubi E Compression Stockings Add-Ons Electronic Signature(s) Signed: 11/06/2021 12:20:53 PM By: Carlene Coria RN Entered By: Carlene Coria on 11/03/2021 11:21:34 Pavlock, [name] ([id number]) -------------------------------------------------------------------------------- Vitals Details Patient Name: Hardie, Anastassia L. Date of Service: 11/03/2021 11:00 AM Medical Record Number: [id number] Patient Account Number: [id number] Date of Birth/Sex: 1941/11/10 (80 y.o.  F) Treating RN: Carlene Coria Primary Care Monroe Toure: [name] Other Clinician: Referring Ulrich Soules: [name] Treating Edilia Ghuman/Extender: Skipper Cliche in Treatment: 9 Vital Signs Time Taken: 11:14 Temperature (F): 97.9 Height (in): 63 Pulse (bpm): 77 Weight (lbs): 198 Respiratory Rate (breaths/min): 18 Body Mass Index (BMI): 35.1 Blood Pressure (mmHg): 181/107 Reference Range: 80 - 120 mg / dl Notes notified Jeri Cos PA of blood pressure , patient denies headache, blurred vision, ringing of ears Electronic Signature(s) Signed: 11/06/2021 12:20:53 PM By: Carlene Coria RN Entered By: Carlene Coria on 11/03/2021 11:15:49  x W x D (cm) 1.7x0.5x0.4 N/A N/A Area (cm) : 0.668 N/A N/A Volume (cm) : 0.267 N/A N/A % Reduction in Area: 46.90% N/A N/A % Reduction in Volume: 57.50% N/A N/A Classification: Full Thickness Without Exposed N/A N/A Support Structures Exudate Amount: Medium N/A N/A Exudate Type: Serosanguineous N/A N/A Exudate Color: red, brown N/A N/A Wound Margin: Flat and Intact N/A N/A Granulation Amount: Medium (34-66%) N/A N/A Granulation Quality: Red N/A N/A Necrotic Amount: Medium (34-66%) N/A N/A Exposed Structures: Fat Layer (Subcutaneous Tissue): N/A N/A Yes Fascia: No Tendon: No Muscle: No Joint: No Bone: No Epithelialization: Small (1-33%) N/A N/A Treatment Notes Electronic Signature(s) Signed: 11/06/2021 12:20:53 PM By: Carlene Coria RN Entered By: Carlene Coria on 11/03/2021 11:34:12 Hartley, [name] ([id number]) -------------------------------------------------------------------------------- [place] Details Patient Name: Curfman, Gabbrielle L. Date of Service: 11/03/2021 11:00 AM Medical Record Number: [id number] Patient Account Number: [id number] Date of Birth/Sex: 1940-12-26 (80 y.o. F) Treating RN: Carlene Coria Primary Care Emmanuelle Hibbitts: [name] Other Clinician: Referring Natiya Seelinger: [name] Treating Thoams Siefert/Extender: Skipper Cliche in Treatment: 9 Active Inactive Wound/Skin Impairment Nursing Diagnoses: Impaired tissue integrity Goals: Patient/caregiver will verbalize understanding of skin care regimen Date Initiated: 08/31/2021 Date Inactivated: 09/21/2021 Target Resolution Date: 08/31/2021 Goal Status: Met Ulcer/skin breakdown will have a volume reduction of 30% by week 4 Date Initiated: 08/31/2021 Date Inactivated: 10/17/2021 Target Resolution Date: 10/01/2021 Goal Status: Unmet Unmet Reason: comorbities Ulcer/skin breakdown will have a volume reduction of 50% by week 8 Date Initiated: 08/31/2021 Target Resolution Date: 10/31/2021 Goal Status: Active Ulcer/skin breakdown will have a volume reduction of 80% by week 12 Date Initiated: 08/31/2021 Target Resolution Date: 12/01/2021 Goal Status: Active Ulcer/skin breakdown will heal within 14 weeks Date Initiated: 08/31/2021 Target Resolution Date: 01/01/2022 Goal Status: Active Interventions: Assess patient/caregiver ability to obtain necessary supplies Assess patient/caregiver ability to perform ulcer/skin care regimen upon admission and as needed Assess ulceration(s) every visit Provide education on ulcer and skin care Treatment Activities: Referred to DME Mililani Murthy for dressing supplies : 08/31/2021 Skin care regimen initiated : 08/31/2021 Notes: Electronic Signature(s) Signed: 11/06/2021 12:20:53 PM By: Carlene Coria RN Entered By: Carlene Coria on 11/03/2021 11:34:03 Sandford, [name] ([id number]) -------------------------------------------------------------------------------- Pain Assessment Details Patient Name: Pandey, Kylie L. Date of Service: 11/03/2021 11:00 AM Medical Record Number: [id number] Patient Account Number: [id number] Date of Birth/Sex: 1941/06/02 (80 y.o. F) Treating RN: Carlene Coria Primary Care Viana Sleep: [name] Other Clinician: Referring Alinda Egolf: [name] Treating Loring Liskey/Extender: Skipper Cliche in  Treatment: 9 Active Problems Location of Pain Severity and Description of Pain Patient Has Paino No Site Locations Pain Management and Medication Current Pain Management: Electronic Signature(s) Signed: 11/06/2021 12:20:53 PM By: Carlene Coria RN Entered By: Carlene Coria on 11/03/2021 11:15:58 Bernard, [name] ([id number]) -------------------------------------------------------------------------------- Patient/Caregiver Education Details Patient Name: Stovall, Deriana L. Date of Service: 11/03/2021 11:00 AM Medical Record Number: [id number] Patient Account Number: [id number] Date of Birth/Gender: 1941/03/08 (80 y.o. F) Treating RN: Carlene Coria Primary Care Physician: [name] Other Clinician: Referring Physician: [name] Treating Physician/Extender: Skipper Cliche in Treatment: 9 Education Assessment Education Provided To: Patient Education Topics Provided Wound/Skin Impairment: Methods: Explain/Verbal Responses: State content correctly Electronic Signature(s) Signed: 11/06/2021 12:20:53 PM By: Carlene Coria RN Entered By: Carlene Coria on 11/03/2021 11:40:32 Vantrease, [name] ([id number]) -------------------------------------------------------------------------------- Wound Assessment Details Patient Name: Wetsel, Shaena L. Date of Service: 11/03/2021 11:00 AM Medical Record Number: [id number] Patient Account Number: [id number] Date of Birth/Sex: 04-15-1941 (80 y.o. F) Treating RN: Carlene Coria Primary  [name], [name] ([id number]) Visit Report for 11/03/2021 Arrival Information Details Patient Name: [name], [name]. Date of Service: 11/03/2021 11:00 AM Medical Record Number: [id number] Patient Account Number: [id number] Date of Birth/Sex: March 04, 1941 (80 y.o. F) Treating RN: Carlene Coria Primary Care Jalie Eiland: [name] Other Clinician: Referring Cali Hope: [name] Treating Nekesha Font/Extender: Skipper Cliche in Treatment: 9 Visit Information History Since Last Visit All ordered tests and consults were completed: No Patient Arrived: [name] Added or deleted any medications: No Arrival Time: 11:09 Any new allergies or adverse reactions: No Accompanied By: self Had a fall or experienced change in No Transfer Assistance: None activities of daily living that may affect Patient Identification Verified: Yes risk of falls: Secondary Verification Process Completed: Yes Signs or symptoms of abuse/neglect since last visito No Patient Requires Transmission-Based Precautions: No Hospitalized since last visit: No Patient Has Alerts: No Implantable device outside of the clinic excluding No cellular tissue based products placed in the center since last visit: Has Dressing in Place as Prescribed: Yes Has Compression in Place as Prescribed: Yes Pain Present Now: No Electronic Signature(s) Signed: 11/06/2021 12:20:53 PM By: Carlene Coria RN Entered By: Carlene Coria on 11/03/2021 11:14:36 Bob, [name] ([id number]) -------------------------------------------------------------------------------- Clinic Level of Care Assessment Details Patient Name: [name], [name] Date of Service: 11/03/2021 11:00 AM Medical Record Number: [id number] Patient Account Number: [id number] Date of Birth/Sex: Aug 27, 1941 (80 y.o. F) Treating RN: Carlene Coria Primary Care Anahli Arvanitis: [name] Other Clinician: Referring Jiovani Mccammon: [name] Treating Khalen Styer/Extender: Skipper Cliche in Treatment: 9 Clinic Level of Care Assessment Items TOOL 4 Quantity Score X - Use when only an EandM is performed on FOLLOW-UP visit 1 0 ASSESSMENTS - Nursing Assessment / Reassessment X - Reassessment of Co-morbidities (includes updates in patient status) 1 10 X- 1 5 Reassessment of Adherence to Treatment Plan ASSESSMENTS - Wound and Skin Assessment / Reassessment X - Simple Wound Assessment / Reassessment - one wound 1 5 _0  - 0 Complex Wound Assessment / Reassessment - multiple wounds _1  - 0 Dermatologic / Skin Assessment (not related to wound area) ASSESSMENTS - Focused Assessment _2  - Circumferential Edema Measurements - multi extremities 0 _3  - 0 Nutritional Assessment / Counseling / Intervention _4  - 0 Lower Extremity Assessment (monofilament, tuning fork, pulses) _5  - 0 Peripheral Arterial Disease Assessment (using hand held doppler) ASSESSMENTS - Ostomy and/or Continence Assessment and Care _6  - Incontinence Assessment and Management 0 _7  - 0 Ostomy Care Assessment and Management (repouching, etc.) PROCESS - Coordination of Care X - Simple Patient / Family Education for ongoing care 1 15 _8  - 0 Complex (extensive) Patient / Family Education for ongoing care _9  - 0 Staff obtains Programmer, systems, Records, Test Results / Process Orders _10  - 0 Staff telephones HHA, Nursing Homes / Clarify orders / etc _11  - 0 Routine Transfer to another Facility (non-emergent condition) _12  - 0 Routine Hospital Admission (non-emergent condition) _13  - 0 New Admissions / Biomedical engineer / Ordering NPWT, Apligraf, etc. _14  - 0 Emergency Hospital Admission (emergent condition) X- 1 10 Simple Discharge Coordination _15  - 0 Complex (extensive) Discharge Coordination PROCESS - Special Needs _16  - Pediatric / Minor Patient Management 0 _17  - 0 Isolation Patient Management _18  - 0 Hearing / Language / Visual special needs _19  - 0 Assessment of Community assistance  (transportation, D/C planning, etc.) _20  - 0 Additional assistance / Altered mentation _21  - 0 Support Surface(s) Assessment (bed, cushion, seat, etc.) INTERVENTIONS - Wound Cleansing / Measurement Cermak, Chana L. ([id number])

## 2021-11-08 ENCOUNTER — Other Ambulatory Visit: Payer: Self-pay

## 2021-11-08 DIAGNOSIS — L97822 Non-pressure chronic ulcer of other part of left lower leg with fat layer exposed: Secondary | ICD-10-CM | POA: Diagnosis not present

## 2021-11-08 NOTE — Progress Notes (Signed)
[name] ([id number]) Visit Report for 11/08/2021 Arrival Information Details Patient Name: [name], [name]. Date of Service: 11/08/2021 3:30 PM Medical Record Number: [id number] Patient Account Number: [id number] Date of Birth/Sex: [birth year] (80 y.o. F) Treating RN: Carlene Coria Primary Care Britainy Kozub: [name] Other Clinician: Referring Azaleah Usman: [name] Treating Andra Matsuo/Extender: Skipper Cliche in Treatment: 9 Visit Information History Since Last Visit All ordered tests and consults were completed: No Patient Arrived: Ambulatory Added or deleted any medications: No Arrival Time: 15:37 Any new allergies or adverse reactions: No Accompanied By: self Had a fall or experienced change in No Transfer Assistance: None activities of daily living that may affect Patient Identification Verified: Yes risk of falls: Secondary Verification Process Completed: Yes Signs or symptoms of abuse/neglect since last visito No Patient Requires Transmission-Based Precautions: No Hospitalized since last visit: No Patient Has Alerts: No Implantable device outside of the clinic excluding No cellular tissue based products placed in the center since last visit: Has Dressing in Place as Prescribed: Yes Pain Present Now: No Electronic Signature(s) Signed: 11/08/2021 4:05:54 PM By: Carlene Coria RN Entered By: Carlene Coria on 11/08/2021 15:52:53 [name], [name] ([id number]) -------------------------------------------------------------------------------- Clinic Level of Care Assessment Details Patient Name: [name], [name] Date of Service: 11/08/2021 3:30 PM Medical Record Number: [id number] Patient Account Number: [id number] Date of Birth/Sex: 20-Dec-1940 (80 y.o. F) Treating RN: Carlene Coria Primary Care Robertson Colclough: [name] Other Clinician: Referring Muscab Brenneman: [name] Treating Yoselyn Mcglade/Extender: Skipper Cliche in Treatment: 9 Clinic Level of Care  Assessment Items TOOL 4 Quantity Score X - Use when only an EandM is performed on FOLLOW-UP visit 1 0 ASSESSMENTS - Nursing Assessment / Reassessment _0  - Reassessment of Co-morbidities (includes updates in patient status) 0 _1  - 0 Reassessment of Adherence to Treatment Plan ASSESSMENTS - Wound and Skin Assessment / Reassessment X - Simple Wound Assessment / Reassessment - one wound 1 5 _2  - 0 Complex Wound Assessment / Reassessment - multiple wounds _3  - 0 Dermatologic / Skin Assessment (not related to wound area) ASSESSMENTS - Focused Assessment _4  - Circumferential Edema Measurements - multi extremities 0 _5  - 0 Nutritional Assessment / Counseling / Intervention _6  - 0 Lower Extremity Assessment (monofilament, tuning fork, pulses) _7  - 0 Peripheral Arterial Disease Assessment (using hand held doppler) ASSESSMENTS - Ostomy and/or Continence Assessment and Care _8  - Incontinence Assessment and Management 0 _9  - 0 Ostomy Care Assessment and Management (repouching, etc.) PROCESS - Coordination of Care X - Simple Patient / Family Education for ongoing care 1 15 _10  - 0 Complex (extensive) Patient / Family Education for ongoing care _11  - 0 Staff obtains Programmer, systems, Records, Test Results / Process Orders _12  - 0 Staff telephones HHA, Nursing Homes / Clarify orders / etc _13  - 0 Routine Transfer to another Facility (non-emergent condition) _14  - 0 Routine Hospital Admission (non-emergent condition) _15  - 0 New Admissions / Biomedical engineer / Ordering NPWT, Apligraf, etc. _16  - 0 Emergency Hospital Admission (emergent condition) X- 1 10 Simple Discharge Coordination _17  - 0 Complex (extensive) Discharge Coordination PROCESS - Special Needs _18  - Pediatric / Minor Patient Management 0 _19  - 0 Isolation Patient Management _20  - 0 Hearing / Language / Visual special needs _21  - 0 Assessment of Community assistance (transportation, D/C planning, etc.) _22  - 0 Additional  assistance / Altered mentation _23  - 0 Support Surface(s) Assessment (bed, cushion, seat, etc.) INTERVENTIONS - Wound Cleansing / Measurement [name], [name] ([id number]) X- 1 5 Simple Wound Cleansing - one  [name] ([id number]) Visit Report for 11/08/2021 Arrival Information Details Patient Name: [name], [name]. Date of Service: 11/08/2021 3:30 PM Medical Record Number: [id number] Patient Account Number: [id number] Date of Birth/Sex: [birth year] (80 y.o. F) Treating RN: Carlene Coria Primary Care Britainy Kozub: [name] Other Clinician: Referring Azaleah Usman: [name] Treating Andra Matsuo/Extender: Skipper Cliche in Treatment: 9 Visit Information History Since Last Visit All ordered tests and consults were completed: No Patient Arrived: Ambulatory Added or deleted any medications: No Arrival Time: 15:37 Any new allergies or adverse reactions: No Accompanied By: self Had a fall or experienced change in No Transfer Assistance: None activities of daily living that may affect Patient Identification Verified: Yes risk of falls: Secondary Verification Process Completed: Yes Signs or symptoms of abuse/neglect since last visito No Patient Requires Transmission-Based Precautions: No Hospitalized since last visit: No Patient Has Alerts: No Implantable device outside of the clinic excluding No cellular tissue based products placed in the center since last visit: Has Dressing in Place as Prescribed: Yes Pain Present Now: No Electronic Signature(s) Signed: 11/08/2021 4:05:54 PM By: Carlene Coria RN Entered By: Carlene Coria on 11/08/2021 15:52:53 [name], [name] ([id number]) -------------------------------------------------------------------------------- Clinic Level of Care Assessment Details Patient Name: [name], [name] Date of Service: 11/08/2021 3:30 PM Medical Record Number: [id number] Patient Account Number: [id number] Date of Birth/Sex: 20-Dec-1940 (80 y.o. F) Treating RN: Carlene Coria Primary Care Robertson Colclough: [name] Other Clinician: Referring Muscab Brenneman: [name] Treating Yoselyn Mcglade/Extender: Skipper Cliche in Treatment: 9 Clinic Level of Care  Assessment Items TOOL 4 Quantity Score X - Use when only an EandM is performed on FOLLOW-UP visit 1 0 ASSESSMENTS - Nursing Assessment / Reassessment _0  - Reassessment of Co-morbidities (includes updates in patient status) 0 _1  - 0 Reassessment of Adherence to Treatment Plan ASSESSMENTS - Wound and Skin Assessment / Reassessment X - Simple Wound Assessment / Reassessment - one wound 1 5 _2  - 0 Complex Wound Assessment / Reassessment - multiple wounds _3  - 0 Dermatologic / Skin Assessment (not related to wound area) ASSESSMENTS - Focused Assessment _4  - Circumferential Edema Measurements - multi extremities 0 _5  - 0 Nutritional Assessment / Counseling / Intervention _6  - 0 Lower Extremity Assessment (monofilament, tuning fork, pulses) _7  - 0 Peripheral Arterial Disease Assessment (using hand held doppler) ASSESSMENTS - Ostomy and/or Continence Assessment and Care _8  - Incontinence Assessment and Management 0 _9  - 0 Ostomy Care Assessment and Management (repouching, etc.) PROCESS - Coordination of Care X - Simple Patient / Family Education for ongoing care 1 15 _10  - 0 Complex (extensive) Patient / Family Education for ongoing care _11  - 0 Staff obtains Programmer, systems, Records, Test Results / Process Orders _12  - 0 Staff telephones HHA, Nursing Homes / Clarify orders / etc _13  - 0 Routine Transfer to another Facility (non-emergent condition) _14  - 0 Routine Hospital Admission (non-emergent condition) _15  - 0 New Admissions / Biomedical engineer / Ordering NPWT, Apligraf, etc. _16  - 0 Emergency Hospital Admission (emergent condition) X- 1 10 Simple Discharge Coordination _17  - 0 Complex (extensive) Discharge Coordination PROCESS - Special Needs _18  - Pediatric / Minor Patient Management 0 _19  - 0 Isolation Patient Management _20  - 0 Hearing / Language / Visual special needs _21  - 0 Assessment of Community assistance (transportation, D/C planning, etc.) _22  - 0 Additional  assistance / Altered mentation _23  - 0 Support Surface(s) Assessment (bed, cushion, seat, etc.) INTERVENTIONS - Wound Cleansing / Measurement [name], [name] ([id number]) X- 1 5 Simple Wound Cleansing - one  wound _0  - 0 Complex Wound Cleansing - multiple wounds X- 1 5 Wound Imaging (photographs - any number of wounds) _1  - 0 Wound Tracing (instead of photographs) X- 1 5 Simple Wound Measurement - one wound _2  - 0 Complex Wound Measurement - multiple wounds INTERVENTIONS - Wound Dressings X - Small Wound Dressing one or multiple wounds 1 10 _3  - 0 Medium Wound Dressing one or multiple wounds _4  - 0 Large Wound Dressing one or multiple wounds <YTKPTWSFKCLEXNTZ>_0<\/YFVCBSWHQPRFFMBW>_4  - 0 Application of Medications - topical <YKZLDJTTSVXBLTJQ>_3<\/ESPQZRAQTMAUQJFH>_5  - 0 Application of Medications - injection INTERVENTIONS - Miscellaneous _7  - External ear exam 0 _8  - 0 Specimen Collection (cultures, biopsies, blood, body fluids, etc.) _9  - 0 Specimen(s) / Culture(s) sent or taken to Lab for analysis _10  - 0 Patient Transfer (multiple staff / Civil Service fast streamer / Similar devices) _11  - 0 Simple Staple / Suture removal (25 or less) _12  - 0 Complex Staple / Suture removal (26 or more) _13  - 0 Hypo / Hyperglycemic Management (close monitor of Blood Glucose) _14  - 0 Ankle / Brachial Index (ABI) - do not check if billed separately X- 1 5 Vital Signs Has the patient been seen at the hospital within the last three years: Yes Total Score: 60 Level Of Care: New/Established - Level 2 Electronic Signature(s) Signed: 11/08/2021 4:05:54 PM By: Carlene Coria RN Entered By: Carlene Coria on 11/08/2021 15:53:42 [name], [name] ([id number]) -------------------------------------------------------------------------------- Encounter Discharge Information Details Patient Name: [name], [name] Date of Service: 11/08/2021 3:30 PM Medical Record Number: [id number] Patient Account Number: [id number] Date of Birth/Sex: Oct 15, 1941 (80 y.o. F) Treating RN: Carlene Coria Primary Care Matilde Markie: [name] Other Clinician: Referring Joushua Dugar: [name] Treating Lekia Nier/Extender: Skipper Cliche in Treatment: 9 Encounter Discharge Information Items Discharge Condition: Stable Ambulatory Status: Ambulatory Discharge Destination: Home Transportation: Private Auto Accompanied By: self Schedule Follow-up Appointment: Yes Clinical Summary of Care: Patient Declined Electronic Signature(s) Signed: 11/08/2021 4:05:54 PM By: Carlene Coria RN Entered By: Carlene Coria on 11/08/2021 15:53:37 [name], [name] ([id number]) -------------------------------------------------------------------------------- Wound Assessment Details Patient Name: [name], [name] Date of Service: 11/08/2021 3:30 PM Medical Record Number: [id number] Patient Account Number: [id number] Date of Birth/Sex: 1941-05-22 (80 y.o. F) Treating RN: Carlene Coria Primary Care Lempi Edwin: [name] Other Clinician: Referring Deng Kemler: [name] Treating Aldene Hendon/Extender: Jeri Cos Weeks in Treatment: 9 Wound Status Wound Number: 1 Primary Etiology: Venous Leg Ulcer Wound Location: Left, Anterior Lower Leg Wound Status: Open Wounding Event: Skin Tear/Laceration Comorbid History: Cataracts, Osteoarthritis, Received Radiation Date Acquired: 07/30/2021 Weeks Of Treatment: 9 Clustered Wound: No Wound Measurements Length: (cm) 1.7 Width: (cm) 0.5 Depth: (cm) 0.4 Area: (cm) 0.668 Volume: (cm) 0.267 % Reduction in Area: 46.9% % Reduction in Volume: 57.5% Epithelialization: Small (1-33%) Tunneling: No Undermining: No Wound Description Classification: Full Thickness Without Exposed Support Structures Wound Margin: Flat and Intact Exudate Amount: Medium Exudate Type: Serosanguineous Exudate Color: red, brown Foul Odor After Cleansing: No Slough/Fibrino Yes Wound Bed Granulation Amount: Medium (34-66%) Exposed Structure Granulation Quality: Red Fascia  Exposed: No Necrotic Amount: Medium (34-66%) Fat Layer (Subcutaneous Tissue) Exposed: Yes Necrotic Quality: Adherent Slough Tendon Exposed: No Muscle Exposed: No Joint Exposed: No Bone Exposed: No Treatment Notes Wound #1 (Lower Leg) Wound Laterality: Left, Anterior Cleanser Byram Ancillary Kit - 15 Day Supply Discharge Instruction: Use supplies as instructed; Kit contains: (15) Saline Bullets; (15) 3x3 Gauze; 15 pr Gloves Soap and Water Discharge Instruction: Gently cleanse wound with antibacterial soap, rinse and pat dry prior to dressing wounds

## 2021-11-10 ENCOUNTER — Ambulatory Visit: Payer: PPO | Admitting: Physician Assistant

## 2021-11-10 NOTE — Progress Notes (Signed)
YATZARY, MERRIWEATHER ([id number]) Visit Report for 11/08/2021 Physician Orders Details Patient Name: Slay, ONYX EDGLEY. Date of Service: 11/08/2021 3:30 PM Medical Record Number: [id number] Patient Account Number: [id number] Date of Birth/Sex: 03-Feb-1941 (80 y.o. F) Treating RN: Carlene Coria Primary Care Provider: [name] Other Clinician: Referring Provider: [name] Treating Provider/Extender: Skipper Cliche in Treatment: 9 Verbal / Phone Orders: No Diagnosis Coding Follow-up Appointments o Return Appointment in 1 week. - once a week provider o Nurse Visit as needed - once per week Bathing/ Shower/ Hygiene o May shower with wound dressing protected with water repellent cover or cast protector. o No tub bath. Anesthetic (Use 'Patient Medications' Section for Anesthetic Order Entry) o Lidocaine applied to wound bed Edema Control - Lymphedema / Segmental Compressive Device / Other o Tubigrip single layer applied. - E o Elevate, Exercise Daily and Avoid Standing for Long Periods of Time. o Elevate legs to the level of the heart and pump ankles as often as possible o Elevate leg(s) parallel to the floor when sitting. Wound Treatment Wound #1 - Lower Leg Wound Laterality: Left, Anterior Cleanser: Byram Ancillary Kit - 15 Day Supply (Generic) 2 x Per Week/30 Days Discharge Instructions: Use supplies as instructed; Kit contains: (15) Saline Bullets; (15) 3x3 Gauze; 15 pr Gloves Cleanser: Soap and Water 2 x Per Week/30 Days Discharge Instructions: Gently cleanse wound with antibacterial soap, rinse and pat dry prior to dressing wounds Topical: Triamcinolone Acetonide Cream, 0.1%, 15 (g) tube 2 x Per Week/30 Days Discharge Instructions: Apply as directed by provider. Primary Dressing: dermacol 2 x Per Week/30 Days Secondary Dressing: Zetuvit Plus Silicone Border Dressing 4x4 (in/in) (Generic) 2 x Per Week/30 Days Compression Wrap: Tubi E 2 x Per Week/30  Days Electronic Signature(s) Signed: 11/08/2021 4:05:54 PM By: Carlene Coria RN Signed: 11/09/2021 7:26:37 PM By: Worthy Keeler PA-C Entered By: Carlene Coria on 11/08/2021 15:53:21 Washam, [name] ([id number]) -------------------------------------------------------------------------------- SuperBill Details Patient Name: Jafari, Journei L. Date of Service: 11/08/2021 Medical Record Number: [id number] Patient Account Number: [id number] Date of Birth/Sex: October 04, 1941 (80 y.o. 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

## 2021-11-14 ENCOUNTER — Other Ambulatory Visit: Payer: Self-pay

## 2021-11-14 DIAGNOSIS — L97822 Non-pressure chronic ulcer of other part of left lower leg with fat layer exposed: Secondary | ICD-10-CM | POA: Diagnosis not present

## 2021-11-14 NOTE — Progress Notes (Signed)
RORIE, DELMORE ([id number]) Visit Report for 11/14/2021 Physician Orders Details Patient Name: [name], [name]. Date of Service: 11/14/2021 3:30 PM Medical Record Number: [id number] Patient Account Number: [id number] Date of Birth/Sex: November 21, 1941 (80 y.o. F) Treating RN: Carlene Coria Primary Care Provider: [name] Other Clinician: Referring Provider: [name] Treating Provider/Extender: Skipper Cliche in Treatment: 10 Verbal / Phone Orders: No Diagnosis Coding Follow-up Appointments o Return Appointment in 1 week. - once a week provider o Nurse Visit as needed - once per week Bathing/ Shower/ Hygiene o May shower with wound dressing protected with water repellent cover or cast protector. o No tub bath. Anesthetic (Use 'Patient Medications' Section for Anesthetic Order Entry) o Lidocaine applied to wound bed Edema Control - Lymphedema / Segmental Compressive Device / Other o Tubigrip single layer applied. - E o Elevate, Exercise Daily and Avoid Standing for Long Periods of Time. o Elevate legs to the level of the heart and pump ankles as often as possible o Elevate leg(s) parallel to the floor when sitting. Wound Treatment Wound #1 - Lower Leg Wound Laterality: Left, Anterior Cleanser: Byram Ancillary Kit - 15 Day Supply (Generic) 2 x Per Week/30 Days Discharge Instructions: Use supplies as instructed; Kit contains: (15) Saline Bullets; (15) 3x3 Gauze; 15 pr Gloves Cleanser: Soap and Water 2 x Per Week/30 Days Discharge Instructions: Gently cleanse wound with antibacterial soap, rinse and pat dry prior to dressing wounds Topical: Triamcinolone Acetonide Cream, 0.1%, 15 (g) tube 2 x Per Week/30 Days Discharge Instructions: Apply as directed by provider. Primary Dressing: dermacol 2 x Per Week/30 Days Secondary Dressing: Zetuvit Plus Silicone Border Dressing 4x4 (in/in) (Generic) 2 x Per Week/30 Days Compression Wrap: Tubi E 2 x Per Week/30  Days Electronic Signature(s) Signed: 11/14/2021 3:58:01 PM By: Carlene Coria RN Signed: 11/14/2021 4:40:54 PM By: Worthy Keeler PA-C Entered By: Carlene Coria on 11/14/2021 15:58:00 [name], [name] ([id number]) -------------------------------------------------------------------------------- SuperBill Details Patient Name: [name], [name] Date of Service: 11/14/2021 Medical Record Number: [id number] Patient Account Number: [id number] Date of Birth/Sex: [birth year] (80 y.o. 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

## 2021-11-14 NOTE — Progress Notes (Signed)
[name] ([id number]) Visit Report for 11/14/2021 Arrival Information Details Patient Name: [name]. Date of Service: 11/14/2021 3:30 PM Medical Record Number: [id number] Patient Account Number: [id number] Date of Birth/Sex: 02-01-1941 (80 y.o. F) Treating RN: Carlene Coria Primary Care Ugochi Henzler: [name] Other Clinician: Referring Keosha Rossa: [name] Treating Tonye Tancredi/Extender: Skipper Cliche in Treatment: 10 Visit Information History Since Last Visit All ordered tests and consults were completed: No Patient Arrived: [name] Added or deleted any medications: No Arrival Time: 15:42 Any new allergies or adverse reactions: No Accompanied By: self Had a fall or experienced change in No Transfer Assistance: None activities of daily living that may affect Patient Identification Verified: Yes risk of falls: Secondary Verification Process Completed: Yes Signs or symptoms of abuse/neglect since last visito No Patient Requires Transmission-Based Precautions: No Hospitalized since last visit: No Patient Has Alerts: No Implantable device outside of the clinic excluding No cellular tissue based products placed in the center since last visit: Has Dressing in Place as Prescribed: Yes Has Compression in Place as Prescribed: Yes Pain Present Now: No Electronic Signature(s) Signed: 11/14/2021 3:56:48 PM By: Carlene Coria RN Entered By: Carlene Coria on 11/14/2021 15:56:47 [name], [name] ([id number]) -------------------------------------------------------------------------------- Clinic Level of Care Assessment Details Patient Name: [name], [name] Date of Service: 11/14/2021 3:30 PM Medical Record Number: [id number] Patient Account Number: [id number] Date of Birth/Sex: 26-Nov-1941 (80 y.o. F) Treating RN: Carlene Coria Primary Care Clarann Helvey: [name] Other Clinician: Referring Klarisa Barman: [name] Treating Lanika Colgate/Extender: Skipper Cliche in Treatment: 10 Clinic Level of Care Assessment Items TOOL 4 Quantity Score X - Use when only an EandM is performed on FOLLOW-UP visit 1 0 ASSESSMENTS - Nursing Assessment / Reassessment X - Reassessment of Co-morbidities (includes updates in patient status) 1 10 X- 1 5 Reassessment of Adherence to Treatment Plan ASSESSMENTS - Wound and Skin Assessment / Reassessment X - Simple Wound Assessment / Reassessment - one wound 1 5 '[]'  - 0 Complex Wound Assessment / Reassessment - multiple wounds '[]'  - 0 Dermatologic / Skin Assessment (not related to wound area) ASSESSMENTS - Focused Assessment '[]'  - Circumferential Edema Measurements - multi extremities 0 '[]'  - 0 Nutritional Assessment / Counseling / Intervention '[]'  - 0 Lower Extremity Assessment (monofilament, tuning fork, pulses) '[]'  - 0 Peripheral Arterial Disease Assessment (using hand held doppler) ASSESSMENTS - Ostomy and/or Continence Assessment and Care '[]'  - Incontinence Assessment and Management 0 '[]'  - 0 Ostomy Care Assessment and Management (repouching, etc.) PROCESS - Coordination of Care X - Simple Patient / Family Education for ongoing care 1 15 '[]'  - 0 Complex (extensive) Patient / Family Education for ongoing care '[]'  - 0 Staff obtains Programmer, systems, Records, Test Results / Process Orders '[]'  - 0 Staff telephones HHA, Nursing Homes / Clarify orders / etc '[]'  - 0 Routine Transfer to another Facility (non-emergent condition) '[]'  - 0 Routine Hospital Admission (non-emergent condition) '[]'  - 0 New Admissions / Biomedical engineer / Ordering NPWT, Apligraf, etc. '[]'  - 0 Emergency Hospital Admission (emergent condition) X- 1 10 Simple Discharge Coordination '[]'  - 0 Complex (extensive) Discharge Coordination PROCESS - Special Needs '[]'  - Pediatric / Minor Patient Management 0 '[]'  - 0 Isolation Patient Management '[]'  - 0 Hearing / Language / Visual special needs '[]'  - 0 Assessment of Community assistance  (transportation, D/C planning, etc.) '[]'  - 0 Additional assistance / Altered mentation '[]'  - 0 Support Surface(s) Assessment (bed, cushion, seat, etc.) INTERVENTIONS - Wound Cleansing / Measurement [name], [name] ([id number])  [name] ([id number]) Visit Report for 11/14/2021 Arrival Information Details Patient Name: [name]. Date of Service: 11/14/2021 3:30 PM Medical Record Number: [id number] Patient Account Number: [id number] Date of Birth/Sex: 02-01-1941 (80 y.o. F) Treating RN: Carlene Coria Primary Care Ugochi Henzler: [name] Other Clinician: Referring Keosha Rossa: [name] Treating Tonye Tancredi/Extender: Skipper Cliche in Treatment: 10 Visit Information History Since Last Visit All ordered tests and consults were completed: No Patient Arrived: [name] Added or deleted any medications: No Arrival Time: 15:42 Any new allergies or adverse reactions: No Accompanied By: self Had a fall or experienced change in No Transfer Assistance: None activities of daily living that may affect Patient Identification Verified: Yes risk of falls: Secondary Verification Process Completed: Yes Signs or symptoms of abuse/neglect since last visito No Patient Requires Transmission-Based Precautions: No Hospitalized since last visit: No Patient Has Alerts: No Implantable device outside of the clinic excluding No cellular tissue based products placed in the center since last visit: Has Dressing in Place as Prescribed: Yes Has Compression in Place as Prescribed: Yes Pain Present Now: No Electronic Signature(s) Signed: 11/14/2021 3:56:48 PM By: Carlene Coria RN Entered By: Carlene Coria on 11/14/2021 15:56:47 [name], [name] ([id number]) -------------------------------------------------------------------------------- Clinic Level of Care Assessment Details Patient Name: [name], [name] Date of Service: 11/14/2021 3:30 PM Medical Record Number: [id number] Patient Account Number: [id number] Date of Birth/Sex: 26-Nov-1941 (80 y.o. F) Treating RN: Carlene Coria Primary Care Clarann Helvey: [name] Other Clinician: Referring Klarisa Barman: [name] Treating Lanika Colgate/Extender: Skipper Cliche in Treatment: 10 Clinic Level of Care Assessment Items TOOL 4 Quantity Score X - Use when only an EandM is performed on FOLLOW-UP visit 1 0 ASSESSMENTS - Nursing Assessment / Reassessment X - Reassessment of Co-morbidities (includes updates in patient status) 1 10 X- 1 5 Reassessment of Adherence to Treatment Plan ASSESSMENTS - Wound and Skin Assessment / Reassessment X - Simple Wound Assessment / Reassessment - one wound 1 5 '[]'  - 0 Complex Wound Assessment / Reassessment - multiple wounds '[]'  - 0 Dermatologic / Skin Assessment (not related to wound area) ASSESSMENTS - Focused Assessment '[]'  - Circumferential Edema Measurements - multi extremities 0 '[]'  - 0 Nutritional Assessment / Counseling / Intervention '[]'  - 0 Lower Extremity Assessment (monofilament, tuning fork, pulses) '[]'  - 0 Peripheral Arterial Disease Assessment (using hand held doppler) ASSESSMENTS - Ostomy and/or Continence Assessment and Care '[]'  - Incontinence Assessment and Management 0 '[]'  - 0 Ostomy Care Assessment and Management (repouching, etc.) PROCESS - Coordination of Care X - Simple Patient / Family Education for ongoing care 1 15 '[]'  - 0 Complex (extensive) Patient / Family Education for ongoing care '[]'  - 0 Staff obtains Programmer, systems, Records, Test Results / Process Orders '[]'  - 0 Staff telephones HHA, Nursing Homes / Clarify orders / etc '[]'  - 0 Routine Transfer to another Facility (non-emergent condition) '[]'  - 0 Routine Hospital Admission (non-emergent condition) '[]'  - 0 New Admissions / Biomedical engineer / Ordering NPWT, Apligraf, etc. '[]'  - 0 Emergency Hospital Admission (emergent condition) X- 1 10 Simple Discharge Coordination '[]'  - 0 Complex (extensive) Discharge Coordination PROCESS - Special Needs '[]'  - Pediatric / Minor Patient Management 0 '[]'  - 0 Isolation Patient Management '[]'  - 0 Hearing / Language / Visual special needs '[]'  - 0 Assessment of Community assistance  (transportation, D/C planning, etc.) '[]'  - 0 Additional assistance / Altered mentation '[]'  - 0 Support Surface(s) Assessment (bed, cushion, seat, etc.) INTERVENTIONS - Wound Cleansing / Measurement [name], [name] ([id number])  X- 1 5 Simple Wound Cleansing - one wound '[]'  - 0 Complex Wound Cleansing - multiple wounds '[]'  - 0 Wound Imaging (photographs - any number of wounds) '[]'  - 0 Wound Tracing (instead of photographs) X- 1 5 Simple Wound Measurement - one wound '[]'  - 0 Complex Wound Measurement - multiple wounds INTERVENTIONS - Wound Dressings X - Small Wound Dressing one or multiple wounds 1 10 '[]'  - 0 Medium Wound Dressing one or multiple wounds '[]'  - 0 Large Wound Dressing one or multiple wounds '[]'  - 0 Application of Medications - topical '[]'  - 0 Application of Medications - injection INTERVENTIONS - Miscellaneous '[]'  - External ear exam 0 '[]'  - 0 Specimen Collection (cultures, biopsies, blood, body fluids, etc.) '[]'  - 0 Specimen(s) / Culture(s) sent or taken to Lab for analysis '[]'  - 0 Patient Transfer (multiple staff / Civil Service fast streamer / Similar devices) '[]'  - 0 Simple Staple / Suture removal (25 or less) '[]'  - 0 Complex Staple / Suture removal (26 or more) '[]'  - 0 Hypo / Hyperglycemic Management (close monitor of Blood Glucose) '[]'  - 0 Ankle / Brachial Index (ABI) - do not check if billed separately X- 1 5 Vital Signs Has the patient been seen at the hospital within the last three years: Yes Total Score: 70 Level Of Care: New/Established - Level 2 Electronic Signature(s) Unsigned Entered ByCarlene Coria on 11/14/2021 15:59:12 Signature(s): Date(s): [name], Adan LMarland Kitchen ([id number]) -------------------------------------------------------------------------------- Encounter Discharge Information Details Patient Name: [name], [name] Date of Service: 11/14/2021 3:30 PM Medical Record Number: [id number] Patient Account Number: [id number] Date of Birth/Sex: Aug 06, 1941  (80 y.o. F) Treating RN: Carlene Coria Primary Care Janylah Belgrave: [name] Other Clinician: Referring Madeliene Tejera: [name] Treating Shahzaib Azevedo/Extender: Skipper Cliche in Treatment: 10 Encounter Discharge Information Items Discharge Condition: Stable Ambulatory Status: Walker Discharge Destination: Home Transportation: Private Auto Accompanied By: self Schedule Follow-up Appointment: Yes Clinical Summary of Care: Patient Declined Electronic Signature(s) Signed: 11/14/2021 3:58:42 PM By: Carlene Coria RN Entered By: Carlene Coria on 11/14/2021 15:58:41 Kroenke, [name] ([id number]) -------------------------------------------------------------------------------- Wound Assessment Details Patient Name: [name], [name] Date of Service: 11/14/2021 3:30 PM Medical Record Number: [id number] Patient Account Number: [id number] Date of Birth/Sex: February 14, 1941 (80 y.o. F) Treating RN: Carlene Coria Primary Care Meliah [name]: [name] Other Clinician: Referring Osker Ayoub: [name] Treating Jozey Janco/Extender: Jeri Cos Weeks in Treatment: 10 Wound Status Wound Number: 1 Primary Etiology: Venous Leg Ulcer Wound Location: Left, Anterior Lower Leg Wound Status: Open Wounding Event: Skin Tear/Laceration Comorbid History: Cataracts, Osteoarthritis, Received Radiation Date Acquired: 07/30/2021 Weeks Of Treatment: 10 Clustered Wound: No Wound Measurements Length: (cm) 1.7 Width: (cm) 0.5 Depth: (cm) 0.4 Area: (cm) 0.668 Volume: (cm) 0.267 % Reduction in Area: 46.9% % Reduction in Volume: 57.5% Epithelialization: Small (1-33%) Tunneling: No Undermining: No Wound Description Classification: Full Thickness Without Exposed Support Structures Wound Margin: Flat and Intact Exudate Amount: Medium Exudate Type: Serosanguineous Exudate Color: red, brown Foul Odor After Cleansing: No Slough/Fibrino Yes Wound Bed Granulation Amount: Medium (34-66%) Exposed  Structure Granulation Quality: Red Fascia Exposed: No Necrotic Amount: Medium (34-66%) Fat Layer (Subcutaneous Tissue) Exposed: Yes Necrotic Quality: Adherent Slough Tendon Exposed: No Muscle Exposed: No Joint Exposed: No Bone Exposed: No Treatment Notes Wound #1 (Lower Leg) Wound Laterality: Left, Anterior Cleanser [employer] Ancillary Kit - 15 Day Supply Discharge Instruction: Use supplies as instructed; Kit contains: (15) Saline Bullets; (15) 3x3 Gauze; 15 pr Gloves Soap and Water Discharge Instruction: Gently cleanse wound with antibacterial soap, rinse and pat dry prior

## 2021-11-17 ENCOUNTER — Ambulatory Visit: Payer: PPO | Admitting: Physician Assistant

## 2021-11-21 ENCOUNTER — Other Ambulatory Visit: Payer: Self-pay

## 2021-11-21 ENCOUNTER — Encounter: Payer: PPO | Admitting: Internal Medicine

## 2021-11-21 DIAGNOSIS — L97822 Non-pressure chronic ulcer of other part of left lower leg with fat layer exposed: Secondary | ICD-10-CM | POA: Diagnosis not present

## 2021-11-21 DIAGNOSIS — I872 Venous insufficiency (chronic) (peripheral): Secondary | ICD-10-CM | POA: Diagnosis not present

## 2021-11-21 NOTE — Progress Notes (Signed)
CAMILLA, SKEEN ([id number]) Visit Report for 11/21/2021 Debridement Details Patient Name: [name], [name]. Date of Service: 11/21/2021 3:00 PM Medical Record Number: [id number] Patient Account Number: [id number] Date of Birth/Sex: October 10, 1941 (80 y.o. F) Treating RN: Levora Dredge Primary Care Provider: [name] Other Clinician: Referring Provider: [name] Treating Provider/Extender: Tito Dine in Treatment: 11 Debridement Performed for Wound #1 Left,Anterior Lower Leg Assessment: Performed By: Physician Ricard Dillon, MD Debridement Type: Debridement Severity of Tissue Pre Debridement: Fat layer exposed Level of Consciousness (Pre- Awake and Alert procedure): Pre-procedure Verification/Time Out Yes - 15:56 Taken: Total Area Debrided (L x W): 1.9 (cm) x 0.5 (cm) = 0.95 (cm) Tissue and other material Viable, Non-Viable, Slough, Subcutaneous, Slough debrided: Level: Skin/Subcutaneous Tissue Debridement Description: Excisional Instrument: Curette Bleeding: Minimum Hemostasis Achieved: Pressure Response to Treatment: Procedure was tolerated well Level of Consciousness (Post- Awake and Alert procedure): Post Debridement Measurements of Total Wound Length: (cm) 1.9 Width: (cm) 0.5 Depth: (cm) 0.4 Volume: (cm) 0.298 Character of Wound/Ulcer Post Debridement: Stable Severity of Tissue Post Debridement: Fat layer exposed Post Procedure Diagnosis Same as Pre-procedure Electronic Signature(s) Signed: 11/21/2021 4:21:37 PM By: Linton Ham MD Signed: 11/21/2021 4:34:52 PM By: Levora Dredge Entered By: Linton Ham on 11/21/2021 16:16:32 Yorke, [name] ([id number]) -------------------------------------------------------------------------------- HPI Details Patient Name: [name], [name] Date of Service: 11/21/2021 3:00 PM Medical Record Number: [id number] Patient Account Number: [id number] Date of Birth/Sex: 09-15-1941  (80 y.o. F) Treating RN: Levora Dredge Primary Care Provider: [name] Other Clinician: Referring Provider: [name] Treating Provider/Extender: Tito Dine in Treatment: 11 History of Present Illness HPI Description: 08/31/2021 upon evaluation today patient presents for initial inspection here in our clinic concerning issues that she is having with a wound on her left anterior lower extremity. This is actually an area that she unfortunately tells me she was picking up a cage and dropped hitting the anterior portion of her shin causing a skin tear. She did not go to the hospital due to the fact that she did not realize that she would need to. This happened on July 30, 2021. With that being said the patient has been on Augmentin she has about 1 and half days of this left. Nonetheless she tells me that she does have a longstanding history of leg swelling. She does have chronic venous insufficiency as well it appears to me. She also has some evidence of may be early stages of lymphedema. She has a history of fibromyalgia, bilateral knee osteoarthritis, and appears to have a fungal infection of the right ankle region. In regard to the arthritis she tells me her legs tend to swell less after she gets her knee injections allowing her to be able to walk and get around much more effectively. Following the injections she does well for a while and then when that seems to be wearing off she tends to start having issues with more swelling because she is more mobile that makes sense from a venous standpoint. 10/10; I was asked to see this patient today who came in for a nurse visit. Firstly she has developed a widespread very pruritic rash involving her abdomen and back. Lesser extent on her feet. The second problem is that she is complaining about the tightness of the wound and wrap on the left leg where the wound is. 09/14/2021 upon evaluation today patient appears to be doing  decently well in regard to her leg ulcer. The biggest issue she is having is a  significant issue with a rash currently. I do not see any signs of active infection systemically which is great news although with regard to the rash she did see dermatology they told her she had eczema that does not seem to sit right with me out think that is mainly the issues she has going on. I do think she may benefit from the use of a short course of prednisone which should help with the rash which is absolutely driving her crazy. 09/21/2021 upon evaluation today patient appears to be doing decently well in regard to her wound this is still pretty deep. The prednisone has helped with the rash although is not completely done and it is doing much better than what it was. Fortunately there is no signs of active infection systemically at this time also great news. 09/28/2021 upon evaluation today patient's wound on her leg actually showing some signs of improvement which is good news I am happy in that regard. Fortunately there does not appear to be any evidence of active infection systemically which is great news. Unfortunately she is still continuing to have issues with her rash which is almost worse than the actual wound itself for her at this point. 10/05/2021 upon evaluation today patient appears to be doing well with regard to her leg ulcer. She is tolerating the dressing changes without complication and overall I am extremely pleased with where we stand. With that being said I do think that she could possibly benefit from a snap VAC if we can gain approval I am not certain if this will be improved or not. Nonetheless I definitely think we can give this a try and if we get approval I think this will help speed up the healing process to be honest. In the interim I do believe the [facility] is doing decently well. 10/10/2021 upon evaluation today patient appears to be doing well with regard to her wound although is  not significantly smaller. We still have quite a bit of depth here. Fortunately there is no signs of active infection systemically at this time. No fevers, chills, nausea, vomiting, or diarrhea. 10/17/2021 upon evaluation today patient appears to be doing poorly in regard to her leg compared to last time there is a lot of irritation today. She actually kept the Zetuvit on all week which does not seem to have been beneficial for her. She was supposed to change it at home we did order her supplies but she did not even find the supplies on her porch until she tells me yesterday. Either way we had already decided that the best thing was probably can to be to have her come in for nurse visits. I think that still probably the best thing to do I feel like there is a bit of dementia here that I was not aware of last week or even the previous visit to be honest that is going to prohibit her from being able to make these dressing changes herself. 10/27/2021 upon evaluation today patient appears to be doing well with regard to her wound currently. She is actually showing signs of excellent improvement and I do feel like the collagen is doing a great job. I am extremely pleased with where we stand today. 11/03/2021 upon evaluation patient's wound bed actually showed signs of good granulation epithelization at this point. I am actually extremely pleased with where we stand and how this is progressing I think the patient is making great progress. She is very happy with today's  findings. 12/27. This is a patient with wounds on her left lateral lower extremity. She has 2 open wounds. She did not tolerate 3 layer compression we have therefore been using Tubigrip. We use dermacol last week but we have endoform to use this week Electronic Signature(s) Signed: 11/21/2021 4:21:37 PM By: Linton Ham MD Entered By: Linton Ham on 11/21/2021 16:14:29 [name], [name]  ([id number]) -------------------------------------------------------------------------------- Physical Exam Details Patient Name: [name], [name] Date of Service: 11/21/2021 3:00 PM Medical Record Number: [id number] Patient Account Number: [id number] Date of Birth/Sex: Oct 09, 1941 (80 y.o. F) Treating RN: Levora Dredge Primary Care Provider: [name] Other Clinician: Referring Provider: [name] Treating Provider/Extender: Tito Dine in Treatment: 11 Constitutional Patient is hypertensive.. Pulse regular and within target range for patient.Marland Kitchen Respirations regular, non-labored and within target range.. Temperature is normal and within the target range for the patient.Marland Kitchen appears in no distress. Notes Wound exam; generally healthy looking granulation although I used a #3 curette to remove surface slough and subcutaneous tissue. Wounds are small but with some depth no evidence of surrounding infection Electronic Signature(s) Signed: 11/21/2021 4:21:37 PM By: Linton Ham MD Entered By: Linton Ham on 11/21/2021 16:15:12 [name], [name] ([id number]) -------------------------------------------------------------------------------- Physician Orders Details Patient Name: [name], [name] Date of Service: 11/21/2021 3:00 PM Medical Record Number: [id number] Patient Account Number: [id number] Date of Birth/Sex: 1941/04/19 (80 y.o. F) Treating RN: Levora Dredge Primary Care Provider: [name] Other Clinician: Referring Provider: [name] Treating Provider/Extender: Tito Dine in Treatment: 109 Verbal / Phone Orders: No Diagnosis Coding Follow-up Appointments o Return Appointment in 1 week. - once a week provider o Nurse Visit as needed - once per week Bathing/ Shower/ Hygiene o May shower with wound dressing protected with water repellent cover or cast protector. o No tub bath. Anesthetic (Use 'Patient Medications'  Section for Anesthetic Order Entry) o Lidocaine applied to wound bed Edema Control - Lymphedema / Segmental Compressive Device / Other o Tubigrip single layer applied. - E o Elevate, Exercise Daily and Avoid Standing for Long Periods of Time. o Elevate legs to the level of the heart and pump ankles as often as possible o Elevate leg(s) parallel to the floor when sitting. Wound Treatment Wound #1 - Lower Leg Wound Laterality: Left, Anterior Cleanser: Byram Ancillary Kit - 15 Day Supply (Generic) 2 x Per Week/30 Days Discharge Instructions: Use supplies as instructed; Kit contains: (15) Saline Bullets; (15) 3x3 Gauze; 15 pr Gloves Cleanser: Soap and Water 2 x Per Week/30 Days Discharge Instructions: Gently cleanse wound with antibacterial soap, rinse and pat dry prior to dressing wounds Topical: Triamcinolone Acetonide Cream, 0.1%, 15 (g) tube 2 x Per Week/30 Days Discharge Instructions: Apply as directed by provider. Primary Dressing: endoform 2 x Per Week/30 Days Discharge Instructions: apply to wound twice a week Secondary Dressing: Zetuvit Plus Silicone Border Dressing 4x4 (in/in) (Generic) 2 x Per Week/30 Days Compression Wrap: Tubi E 2 x Per Week/30 Days Electronic Signature(s) Signed: 11/21/2021 4:21:37 PM By: Linton Ham MD Signed: 11/21/2021 4:34:52 PM By: Levora Dredge Entered By: Levora Dredge on 11/21/2021 16:13:00 [name], [name] ([id number]) -------------------------------------------------------------------------------- Problem List Details Patient Name: [name], [name] Date of Service: 11/21/2021 3:00 PM Medical Record Number: [id number] Patient Account Number: [id number] Date of Birth/Sex: 1940/12/05 (80 y.o. F) Treating RN: Levora Dredge Primary Care Provider: [name] Other Clinician: Referring Provider: [name] Treating Provider/Extender: Tito Dine in Treatment: 11 Active Problems ICD-10 Encounter Code  Description Active Date  MDM Diagnosis I87.2 Venous insufficiency (chronic) (peripheral) 08/31/2021 No Yes L97.822 Non-pressure chronic ulcer of other part of left lower leg with fat layer 08/31/2021 No Yes exposed M79.7 Fibromyalgia 08/31/2021 No Yes M17.0 Bilateral primary osteoarthritis of knee 08/31/2021 No Yes B35.4 Tinea corporis 08/31/2021 No Yes R21 Rash and other nonspecific skin eruption 09/04/2021 No Yes Inactive Problems Resolved Problems Electronic Signature(s) Signed: 11/21/2021 4:21:37 PM By: Linton Ham MD Entered By: Linton Ham on 11/21/2021 16:07:14 [name], [name] ([id number]) -------------------------------------------------------------------------------- Progress Note Details Patient Name: [name], [name] Date of Service: 11/21/2021 3:00 PM Medical Record Number: [id number] Patient Account Number: [id number] Date of Birth/Sex: 30-Jan-1941 (80 y.o. F) Treating RN: Levora Dredge Primary Care Provider: [name] Other Clinician: Referring Provider: [name] Treating Provider/Extender: Tito Dine in Treatment: 11 Subjective History of Present Illness (HPI) 08/31/2021 upon evaluation today patient presents for initial inspection here in our clinic concerning issues that she is having with a wound on her left anterior lower extremity. This is actually an area that she unfortunately tells me she was picking up a cage and dropped hitting the anterior portion of her shin causing a skin tear. She did not go to the hospital due to the fact that she did not realize that she would need to. This happened on July 30, 2021. With that being said the patient has been on Augmentin she has about 1 and half days of this left. Nonetheless she tells me that she does have a longstanding history of leg swelling. She does have chronic venous insufficiency as well it appears to me. She also has some evidence of may be early stages of lymphedema. She has  a history of fibromyalgia, bilateral knee osteoarthritis, and appears to have a fungal infection of the right ankle region. In regard to the arthritis she tells me her legs tend to swell less after she gets her knee injections allowing her to be able to walk and get around much more effectively. Following the injections she does well for a while and then when that seems to be wearing off she tends to start having issues with more swelling because she is more mobile that makes sense from a venous standpoint. 10/10; I was asked to see this patient today who came in for a nurse visit. Firstly she has developed a widespread very pruritic rash involving her abdomen and back. Lesser extent on her feet. The second problem is that she is complaining about the tightness of the wound and wrap on the left leg where the wound is. 09/14/2021 upon evaluation today patient appears to be doing decently well in regard to her leg ulcer. The biggest issue she is having is a significant issue with a rash currently. I do not see any signs of active infection systemically which is great news although with regard to the rash she did see dermatology they told her she had eczema that does not seem to sit right with me out think that is mainly the issues she has going on. I do think she may benefit from the use of a short course of prednisone which should help with the rash which is absolutely driving her crazy. 09/21/2021 upon evaluation today patient appears to be doing decently well in regard to her wound this is still pretty deep. The prednisone has helped with the rash although is not completely done and it is doing much better than what it was. Fortunately there is no signs of active infection systemically at this  time also great news. 09/28/2021 upon evaluation today patient's wound on her leg actually showing some signs of improvement which is good news I am happy in that regard. Fortunately there does not appear to be  any evidence of active infection systemically which is great news. Unfortunately she is still continuing to have issues with her rash which is almost worse than the actual wound itself for her at this point. 10/05/2021 upon evaluation today patient appears to be doing well with regard to her leg ulcer. She is tolerating the dressing changes without complication and overall I am extremely pleased with where we stand. With that being said I do think that she could possibly benefit from a snap VAC if we can gain approval I am not certain if this will be improved or not. Nonetheless I definitely think we can give this a try and if we get approval I think this will help speed up the healing process to be honest. In the interim I do believe the [facility] is doing decently well. 10/10/2021 upon evaluation today patient appears to be doing well with regard to her wound although is not significantly smaller. We still have quite a bit of depth here. Fortunately there is no signs of active infection systemically at this time. No fevers, chills, nausea, vomiting, or diarrhea. 10/17/2021 upon evaluation today patient appears to be doing poorly in regard to her leg compared to last time there is a lot of irritation today. She actually kept the Zetuvit on all week which does not seem to have been beneficial for her. She was supposed to change it at home we did order her supplies but she did not even find the supplies on her porch until she tells me yesterday. Either way we had already decided that the best thing was probably can to be to have her come in for nurse visits. I think that still probably the best thing to do I feel like there is a bit of dementia here that I was not aware of last week or even the previous visit to be honest that is going to prohibit her from being able to make these dressing changes herself. 10/27/2021 upon evaluation today patient appears to be doing well with regard to her wound  currently. She is actually showing signs of excellent improvement and I do feel like the collagen is doing a great job. I am extremely pleased with where we stand today. 11/03/2021 upon evaluation patient's wound bed actually showed signs of good granulation epithelization at this point. I am actually extremely pleased with where we stand and how this is progressing I think the patient is making great progress. She is very happy with today's findings. 12/27. This is a patient with wounds on her left lateral lower extremity. She has 2 open wounds. She did not tolerate 3 layer compression we have therefore been using Tubigrip. We use dermacol last week but we have endoform to use this week Objective Constitutional Patient is hypertensive.. Pulse regular and within target range for patient.Marland Kitchen Respirations regular, non-labored and within target range.. Temperature is normal and within the target range for the patient.Marland Kitchen appears in no distress. [name], [name] ([id number]) Vitals Time Taken: 3:29 PM, Height: 63 in, Weight: 198 lbs, BMI: 35.1, Temperature: 98.1 F, Pulse: 84 bpm, Respiratory Rate: 18 breaths/min, Blood Pressure: 177/102 mmHg. General Notes: pt asymptomatic, patient states runs high when at the doctors General Notes: Wound exam; generally healthy looking granulation although I used a #3  curette to remove surface slough and subcutaneous tissue. Wounds are small but with some depth no evidence of surrounding infection Integumentary (Hair, Skin) Wound #1 status is Open. Original cause of wound was Skin Tear/Laceration. The date acquired was: 07/30/2021. The wound has been in treatment 11 weeks. The wound is located on the Left,Anterior Lower Leg. The wound measures 1.9cm length x 0.5cm width x 0.4cm depth; 0.746cm^2 area and 0.298cm^3 volume. There is Fat Layer (Subcutaneous Tissue) exposed. There is no tunneling or undermining noted. There is a medium amount of serosanguineous drainage  noted. The wound margin is flat and intact. There is small (1-33%) red granulation within the wound bed. There is a medium (34-66%) amount of necrotic tissue within the wound bed including Adherent Slough. Assessment Active Problems ICD-10 Venous insufficiency (chronic) (peripheral) Non-pressure chronic ulcer of other part of left lower leg with fat layer exposed Fibromyalgia Bilateral primary osteoarthritis of knee Tinea corporis Rash and other nonspecific skin eruption Procedures Wound #1 Pre-procedure diagnosis of Wound #1 is a Venous Leg Ulcer located on the Left,Anterior Lower Leg .Severity of Tissue Pre Debridement is: Fat layer exposed. There was a Excisional Skin/Subcutaneous Tissue Debridement with a total area of 0.95 sq cm performed by Ricard Dillon, MD. With the following instrument(s): Curette to remove Viable and Non-Viable tissue/material. Material removed includes Subcutaneous Tissue and Slough and. No specimens were taken. A time out was conducted at 15:56, prior to the start of the procedure. A Minimum amount of bleeding was controlled with Pressure. The procedure was tolerated well. Post Debridement Measurements: 1.9cm length x 0.5cm width x 0.4cm depth; 0.298cm^3 volume. Character of Wound/Ulcer Post Debridement is stable. Severity of Tissue Post Debridement is: Fat layer exposed. Post procedure Diagnosis Wound #1: Same as Pre-Procedure Plan Follow-up Appointments: Return Appointment in 1 week. - once a week provider Nurse Visit as needed - once per week Bathing/ Shower/ Hygiene: May shower with wound dressing protected with water repellent cover or cast protector. No tub bath. Anesthetic (Use 'Patient Medications' Section for Anesthetic Order Entry): Lidocaine applied to wound bed Edema Control - Lymphedema / Segmental Compressive Device / Other: Tubigrip single layer applied. - E Elevate, Exercise Daily and Avoid Standing for Long Periods of Time. Elevate  legs to the level of the heart and pump ankles as often as possible Elevate leg(s) parallel to the floor when sitting. WOUND #1: - Lower Leg Wound Laterality: Left, Anterior Cleanser: Byram Ancillary Kit - 15 Day Supply (Generic) 2 x Per Week/30 Days Discharge Instructions: Use supplies as instructed; Kit contains: (15) Saline Bullets; (15) 3x3 Gauze; 15 pr Gloves Cleanser: Soap and Water 2 x Per Week/30 Days Discharge Instructions: Gently cleanse wound with antibacterial soap, rinse and pat dry prior to dressing wounds Topical: Triamcinolone Acetonide Cream, 0.1%, 15 (g) tube 2 x Per Week/30 Days [name], [name] ([id number]) Discharge Instructions: Apply as directed by provider. Primary Dressing: endoform 2 x Per Week/30 Days Discharge Instructions: apply to wound twice a week Secondary Dressing: Zetuvit Plus Silicone Border Dressing 4x4 (in/in) (Generic) 2 x Per Week/30 Days Compression Wrap: Tubi E 2 x Per Week/30 Days #1 we used endoform still with Tubigrip. She will be back with for a nurse visit 2. She has I think support stockings although I do not think these would have enough pressure to support the edema Electronic Signature(s) Signed: 11/21/2021 4:21:37 PM By: Linton Ham MD Entered By: Linton Ham on 11/21/2021 16:15:51 [name], [name] ([id number]) -------------------------------------------------------------------------------- SuperBill Details Patient Name:  Gerag[place], [place] Date of Service: 11/21/2021 Medical Record Number: [id number] Patient Account Number: [id number] Date of Birth/Sex: Mar 11, 1941 (80 y.o. F) Treating RN: Levora Dredge Primary Care Provider: [name] Other Clinician: Referring Provider: [name] Treating Provider/Extender: Tito Dine in Treatment: 11 Diagnosis Coding ICD-10 Codes Code Description I87.2 Venous insufficiency (chronic) (peripheral) L97.822 Non-pressure chronic ulcer of other part of left lower  leg with fat layer exposed M79.7 Fibromyalgia M17.0 Bilateral primary osteoarthritis of knee B35.4 Tinea corporis R21 Rash and other nonspecific skin eruption Facility Procedures CPT4 Code: [id number] Description: 79892 - DEB SUBQ TISSUE 20 SQ CM/< Modifier: Quantity: 1 CPT4 Code: Description: ICD-10 Diagnosis Description L97.822 Non-pressure chronic ulcer of other part of left lower leg with fat layer Modifier: exposed Quantity: Physician Procedures CPT4 Code: [id number] Description: 08144 - WC PHYS SUBQ TISS 20 SQ CM Modifier: Quantity: 1 CPT4 Code: Description: ICD-10 Diagnosis Description L97.822 Non-pressure chronic ulcer of other part of left lower leg with fat layer Modifier: exposed Quantity: Electronic Signature(s) Signed: 11/21/2021 4:21:37 PM By: Linton Ham MD Entered By: Linton Ham on 11/21/2021 16:16:07

## 2021-11-21 NOTE — Progress Notes (Signed)
[id number] Date of Birth/Sex: 1941/03/30 (80 y.o. F) Treating RN: Levora Dredge Primary Care : [name] Other Clinician: Referring : [name] Treating /Extender: Tito Dine in Treatment: 11 Active Inactive Wound/Skin Impairment Nursing Diagnoses: Impaired tissue integrity Goals: Patient/caregiver will verbalize understanding of skin care regimen Date Initiated: 08/31/2021 Date Inactivated: 09/21/2021 Target Resolution Date: 08/31/2021 Goal Status: Met Ulcer/skin breakdown will have a volume reduction of 30% by week 4 Date Initiated: 08/31/2021 Date Inactivated: 10/17/2021 Target Resolution Date: 10/01/2021 Goal Status: Unmet Unmet Reason: comorbities Ulcer/skin breakdown will have a volume reduction of 50% by week 8 Date Initiated: 08/31/2021 Target Resolution Date: 10/31/2021 Goal Status: Active Ulcer/skin breakdown will have a volume reduction of 80% by week 12 Date  Initiated: 08/31/2021 Target Resolution Date: 12/01/2021 Goal Status: Active Ulcer/skin breakdown will heal within 14 weeks Date Initiated: 08/31/2021 Target Resolution Date: 01/01/2022 Goal Status: Active Interventions: Assess patient/caregiver ability to obtain necessary supplies Assess patient/caregiver ability to perform ulcer/skin care regimen upon admission and as needed Assess ulceration(s) every visit Provide education on ulcer and skin care Treatment Activities: Referred to DME  for dressing supplies : 08/31/2021 Skin care regimen initiated : 08/31/2021 Notes: Electronic Signature(s) Signed: 11/21/2021 4:17:23 PM By: Levora Dredge Entered By: Levora Dredge on 11/21/2021 16:17:23 [place], [name] ([id number]) -------------------------------------------------------------------------------- Pain Assessment Details Patient Name: [name], [name] Date of Service: 11/21/2021 3:00 PM Medical Record Number: [id number] Patient Account Number: [id number] Date of Birth/Sex: [birth year] (80 y.o. F) Treating RN: Levora Dredge Primary Care : [name] Other Clinician: Referring : [name] Treating /Extender: Tito Dine in Treatment: 11 Active Problems Location of Pain Severity and Description of Pain Patient Has Paino No Site Locations Rate the pain. Current Pain Level: 0 Pain Management and Medication Current Pain Management: Electronic Signature(s) Signed: 11/21/2021 4:34:52 PM By: Levora Dredge Entered By: Levora Dredge on 11/21/2021 15:34:02 Brickman, [name] ([id number]) -------------------------------------------------------------------------------- Patient/Caregiver Education Details Patient Name: [name], [name] Date of Service: 11/21/2021 3:00 PM Medical Record Number: [id number] Patient Account Number: [id number] Date of Birth/Gender: 1941-06-27 (80 y.o. F) Treating RN: Levora Dredge Primary Care  Physician: [name] Other Clinician: Referring Physician: [name] Treating Physician/Extender: Tito Dine in Treatment: 11 Education Assessment Education Provided To: Patient Education Topics Provided Wound/Skin Impairment: Handouts: Caring for Your Ulcer Methods: Explain/Verbal Responses: State content correctly Electronic Signature(s) Signed: 11/21/2021 4:34:52 PM By: Levora Dredge Entered By: Levora Dredge on 11/21/2021 16:17:55 Wyke, [name] ([id number]) -------------------------------------------------------------------------------- Wound Assessment Details Patient Name: [name], [name] Date of Service: 11/21/2021 3:00 PM Medical Record Number: [id number] Patient Account Number: [id number] Date of Birth/Sex: 1941/10/15 (80 y.o. F) Treating RN: Levora Dredge Primary Care : [name] Other Clinician: Referring : [name] Treating /Extender: Tito Dine in Treatment: 11 Wound Status Wound Number: 1 Primary Etiology: Venous Leg Ulcer Wound Location: Left, Anterior Lower Leg Wound Status: Open Wounding Event: Skin Tear/Laceration Comorbid History: Cataracts, Osteoarthritis, Received Radiation Date Acquired: 07/30/2021 Weeks Of Treatment: 11 Clustered Wound: No Photos Wound Measurements Length: (cm) 1.9 Width: (cm) 0.5 Depth: (cm) 0.4 Area: (cm) 0.746 Volume: (cm) 0.298 % Reduction in Area: 40.7% % Reduction in Volume: 52.5% Epithelialization: Small (1-33%) Tunneling: No Undermining: No Wound Description Classification: Full Thickness Without Exposed Support Structures Wound Margin: Flat and Intact Exudate Amount: Medium Exudate Type: Serosanguineous Exudate Color: red, brown Foul Odor After Cleansing: No Slough/Fibrino Yes Wound Bed Granulation Amount: Small (1-33%) Exposed Structure Granulation Quality: Red Fascia Exposed: No Necrotic Amount: Medium (  34-66%) Fat Layer  (Subcutaneous Tissue) Exposed: Yes Necrotic Quality: Adherent Slough Tendon Exposed: No Muscle Exposed: No Joint Exposed: No Bone Exposed: No Treatment Notes Wound #1 (Lower Leg) Wound Laterality: Left, Anterior Cleanser Byram Ancillary Kit - 15 Day Supply Discharge Instruction: Use supplies as instructed; Kit contains: (15) Saline Bullets; (15) 3x3 Gauze; 15 pr Gloves Soap and [place], Ernestine L. ([id number]) Discharge Instruction: Gently cleanse wound with antibacterial soap, rinse and pat dry prior to dressing wounds Peri-Wound Care Topical Triamcinolone Acetonide Cream, 0.1%, 15 (g) tube Discharge Instruction: Apply as directed by . Primary Dressing endoform Discharge Instruction: apply to wound twice a week Secondary Dressing Zetuvit Plus Silicone Border Dressing 4x4 (in/in) Secured With Compression Wrap Tubi E Compression Stockings Add-Ons Electronic Signature(s) Signed: 11/21/2021 4:34:52 PM By: Levora Dredge Entered By: Levora Dredge on 11/21/2021 15:41:15 Bachtell, [name] ([id number]) -------------------------------------------------------------------------------- Vitals Details Patient Name: [name], [name] Date of Service: 11/21/2021 3:00 PM Medical Record Number: [id number] Patient Account Number: [id number] Date of Birth/Sex: 02-19-1941 (80 y.o. F) Treating RN: Levora Dredge Primary Care : [name] Other Clinician: Referring : [name] Treating /Extender: Tito Dine in Treatment: 11 Vital Signs Time Taken: 15:29 Temperature (F): 98.1 Height (in): 63 Pulse (bpm): 84 Weight (lbs): 198 Respiratory Rate (breaths/min): 18 Body Mass Index (BMI): 35.1 Blood Pressure (mmHg): 177/102 Reference Range: 80 - 120 mg / dl Notes pt asymptomatic, patient states runs high when at the doctors Electronic Signature(s) Signed: 11/21/2021 4:34:52 PM By: Levora Dredge Entered By: Levora Dredge on 11/21/2021 15:33:32  [id number] Date of Birth/Sex: 1941/03/30 (80 y.o. F) Treating RN: Levora Dredge Primary Care Memorie Yokoy[place]: [name] Other Clinician: Referring Carlesha Seiple: [name] Treating Shivani Barrantes/Extender: Tito Dine in Treatment: 11 Active Inactive Wound/Skin Impairment Nursing Diagnoses: Impaired tissue integrity Goals: Patient/caregiver will verbalize understanding of skin care regimen Date Initiated: 08/31/2021 Date Inactivated: 09/21/2021 Target Resolution Date: 08/31/2021 Goal Status: Met Ulcer/skin breakdown will have a volume reduction of 30% by week 4 Date Initiated: 08/31/2021 Date Inactivated: 10/17/2021 Target Resolution Date: 10/01/2021 Goal Status: Unmet Unmet Reason: comorbities Ulcer/skin breakdown will have a volume reduction of 50% by week 8 Date Initiated: 08/31/2021 Target Resolution Date: 10/31/2021 Goal Status: Active Ulcer/skin breakdown will have a volume reduction of 80% by week 12 Date  Initiated: 08/31/2021 Target Resolution Date: 12/01/2021 Goal Status: Active Ulcer/skin breakdown will heal within 14 weeks Date Initiated: 08/31/2021 Target Resolution Date: 01/01/2022 Goal Status: Active Interventions: Assess patient/caregiver ability to obtain necessary supplies Assess patient/caregiver ability to perform ulcer/skin care regimen upon admission and as needed Assess ulceration(s) every visit Provide education on ulcer and skin care Treatment Activities: Referred to DME Quintel Mccalla for dressing supplies : 08/31/2021 Skin care regimen initiated : 08/31/2021 Notes: Electronic Signature(s) Signed: 11/21/2021 4:17:23 PM By: Levora Dredge Entered By: Levora Dredge on 11/21/2021 16:17:23 [place], [name] ([id number]) -------------------------------------------------------------------------------- Pain Assessment Details Patient Name: [name], [name] Date of Service: 11/21/2021 3:00 PM Medical Record Number: [id number] Patient Account Number: [id number] Date of Birth/Sex: [birth year] (80 y.o. F) Treating RN: Levora Dredge Primary Care Tyler Cubit: [name] Other Clinician: Referring Ossie Yebra: [name] Treating Bernon Arviso/Extender: Tito Dine in Treatment: 11 Active Problems Location of Pain Severity and Description of Pain Patient Has Paino No Site Locations Rate the pain. Current Pain Level: 0 Pain Management and Medication Current Pain Management: Electronic Signature(s) Signed: 11/21/2021 4:34:52 PM By: Levora Dredge Entered By: Levora Dredge on 11/21/2021 15:34:02 Brickman, [name] ([id number]) -------------------------------------------------------------------------------- Patient/Caregiver Education Details Patient Name: [name], [name] Date of Service: 11/21/2021 3:00 PM Medical Record Number: [id number] Patient Account Number: [id number] Date of Birth/Gender: 1941-06-27 (80 y.o. F) Treating RN: Levora Dredge Primary Care  Physician: [name] Other Clinician: Referring Physician: [name] Treating Physician/Extender: Tito Dine in Treatment: 11 Education Assessment Education Provided To: Patient Education Topics Provided Wound/Skin Impairment: Handouts: Caring for Your Ulcer Methods: Explain/Verbal Responses: State content correctly Electronic Signature(s) Signed: 11/21/2021 4:34:52 PM By: Levora Dredge Entered By: Levora Dredge on 11/21/2021 16:17:55 Wyke, [name] ([id number]) -------------------------------------------------------------------------------- Wound Assessment Details Patient Name: [name], [name] Date of Service: 11/21/2021 3:00 PM Medical Record Number: [id number] Patient Account Number: [id number] Date of Birth/Sex: 1941/10/15 (80 y.o. F) Treating RN: Levora Dredge Primary Care Dorothee Napierkowski: [name] Other Clinician: Referring Flor Whitacre: [name] Treating Lonetta Blassingame/Extender: Tito Dine in Treatment: 11 Wound Status Wound Number: 1 Primary Etiology: Venous Leg Ulcer Wound Location: Left, Anterior Lower Leg Wound Status: Open Wounding Event: Skin Tear/Laceration Comorbid History: Cataracts, Osteoarthritis, Received Radiation Date Acquired: 07/30/2021 Weeks Of Treatment: 11 Clustered Wound: No Photos Wound Measurements Length: (cm) 1.9 Width: (cm) 0.5 Depth: (cm) 0.4 Area: (cm) 0.746 Volume: (cm) 0.298 % Reduction in Area: 40.7% % Reduction in Volume: 52.5% Epithelialization: Small (1-33%) Tunneling: No Undermining: No Wound Description Classification: Full Thickness Without Exposed Support Structures Wound Margin: Flat and Intact Exudate Amount: Medium Exudate Type: Serosanguineous Exudate Color: red, brown Foul Odor After Cleansing: No Slough/Fibrino Yes Wound Bed Granulation Amount: Small (1-33%) Exposed Structure Granulation Quality: Red Fascia Exposed: No Necrotic Amount: Medium (  34-66%) Fat Layer  (Subcutaneous Tissue) Exposed: Yes Necrotic Quality: Adherent Slough Tendon Exposed: No Muscle Exposed: No Joint Exposed: No Bone Exposed: No Treatment Notes Wound #1 (Lower Leg) Wound Laterality: Left, Anterior Cleanser Byram Ancillary Kit - 15 Day Supply Discharge Instruction: Use supplies as instructed; Kit contains: (15) Saline Bullets; (15) 3x3 Gauze; 15 pr Gloves Soap and [place], Ernestine L. ([id number]) Discharge Instruction: Gently cleanse wound with antibacterial soap, rinse and pat dry prior to dressing wounds Peri-Wound Care Topical Triamcinolone Acetonide Cream, 0.1%, 15 (g) tube Discharge Instruction: Apply as directed by provider. Primary Dressing endoform Discharge Instruction: apply to wound twice a week Secondary Dressing Zetuvit Plus Silicone Border Dressing 4x4 (in/in) Secured With Compression Wrap Tubi E Compression Stockings Add-Ons Electronic Signature(s) Signed: 11/21/2021 4:34:52 PM By: Levora Dredge Entered By: Levora Dredge on 11/21/2021 15:41:15 Bachtell, [name] ([id number]) -------------------------------------------------------------------------------- Vitals Details Patient Name: [name], [name] Date of Service: 11/21/2021 3:00 PM Medical Record Number: [id number] Patient Account Number: [id number] Date of Birth/Sex: 02-19-1941 (80 y.o. F) Treating RN: Levora Dredge Primary Care Provider: [name] Other Clinician: Referring Provider: [name] Treating Provider/Extender: Tito Dine in Treatment: 11 Vital Signs Time Taken: 15:29 Temperature (F): 98.1 Height (in): 63 Pulse (bpm): 84 Weight (lbs): 198 Respiratory Rate (breaths/min): 18 Body Mass Index (BMI): 35.1 Blood Pressure (mmHg): 177/102 Reference Range: 80 - 120 mg / dl Notes pt asymptomatic, patient states runs high when at the doctors Electronic Signature(s) Signed: 11/21/2021 4:34:52 PM By: Levora Dredge Entered By: Levora Dredge on 11/21/2021 15:33:32

## 2021-11-24 ENCOUNTER — Other Ambulatory Visit: Payer: Self-pay

## 2021-11-24 DIAGNOSIS — L97822 Non-pressure chronic ulcer of other part of left lower leg with fat layer exposed: Secondary | ICD-10-CM | POA: Diagnosis not present

## 2021-11-24 NOTE — Progress Notes (Signed)
[name], [name] ([id number]) Visit Report for 11/24/2021 Arrival Information Details Patient Name: [name], [name]. Date of Service: 11/24/2021 12:45 PM Medical Record Number: [id number] Patient Account Number: [id number] Date of Birth/Sex: 1941/11/15 (80 y.o. F) Treating RN: Angelina Pih Primary Care Darick Fetters: [name] Givens Other Clinician: Referring Falisha Osment: [name] Givens Treating Melda Mermelstein/Extender: Altamese [place] in Treatment: 12 Visit Information History Since Last Visit Added or deleted any medications: No Patient Arrived: Dan Humphreys Any new allergies or adverse reactions: No Arrival Time: 13:09 Had a fall or experienced change in No Accompanied By: self activities of daily living that may affect Transfer Assistance: None risk of falls: Patient Identification Verified: Yes Hospitalized since last visit: No Secondary Verification Process Completed: Yes Has Dressing in Place as Prescribed: Yes Patient Requires Transmission-Based Precautions: No Pain Present Now: No Patient Has Alerts: No Electronic Signature(s) Signed: 11/24/2021 4:39:16 PM By: Angelina Pih Entered By: Angelina Pih on 11/24/2021 13:09:53 [name] ([id number]) -------------------------------------------------------------------------------- Clinic Level of Care Assessment Details Patient Name: [name], [name] Date of Service: 11/24/2021 12:45 PM Medical Record Number: [id number] Patient Account Number: [id number] Date of Birth/Sex: [birth year] (80 y.o. F) Treating RN: Angelina Pih Primary Care Joeangel Jeanpaul: [name] Givens Other Clinician: Referring Rasheed Welty: [name] Givens Treating Margarett Viti/Extender: Altamese [place] in Treatment: 12 Clinic Level of Care Assessment Items TOOL 4 Quantity Score X - Use when only an EandM is performed on FOLLOW-UP visit 1 0 ASSESSMENTS - Nursing Assessment / Reassessment []  - Reassessment of Co-morbidities (includes updates in  patient status) 0 []  - 0 Reassessment of Adherence to Treatment Plan ASSESSMENTS - Wound and Skin Assessment / Reassessment X - Simple Wound Assessment / Reassessment - one wound 1 5 X- 1 5 Complex Wound Assessment / Reassessment - multiple wounds X- 1 10 Dermatologic / Skin Assessment (not related to wound area) ASSESSMENTS - Focused Assessment []  - Circumferential Edema Measurements - multi extremities 0 []  - 0 Nutritional Assessment / Counseling / Intervention []  - 0 Lower Extremity Assessment (monofilament, tuning fork, pulses) []  - 0 Peripheral Arterial Disease Assessment (using hand held doppler) ASSESSMENTS - Ostomy and/or Continence Assessment and Care []  - Incontinence Assessment and Management 0 []  - 0 Ostomy Care Assessment and Management (repouching, etc.) PROCESS - Coordination of Care X - Simple Patient / Family Education for ongoing care 1 15 []  - 0 Complex (extensive) Patient / Family Education for ongoing care []  - 0 Staff obtains Chiropractor, Records, Test Results / Process Orders []  - 0 Staff telephones HHA, Nursing Homes / Clarify orders / etc []  - 0 Routine Transfer to another Facility (non-emergent condition) []  - 0 Routine Hospital Admission (non-emergent condition) []  - 0 New Admissions / Manufacturing engineer / Ordering NPWT, Apligraf, etc. []  - 0 Emergency Hospital Admission (emergent condition) X- 1 10 Simple Discharge Coordination []  - 0 Complex (extensive) Discharge Coordination PROCESS - Special Needs []  - Pediatric / Minor Patient Management 0 []  - 0 Isolation Patient Management []  - 0 Hearing / Language / Visual special needs []  - 0 Assessment of Community assistance (transportation, D/C planning, etc.) []  - 0 Additional assistance / Altered mentation []  - 0 Support Surface(s) Assessment (bed, cushion, seat, etc.) INTERVENTIONS - Wound Cleansing / Measurement [name], [name] ([id number]) X- 1 5 Simple Wound Cleansing - one  wound []  - 0 Complex Wound Cleansing - multiple wounds []  - 0 Wound Imaging (photographs - any number of wounds) []  - 0 Wound Tracing (instead of photographs) []  - 0 Simple Wound Measurement -  one wound []  - 0 Complex Wound Measurement - multiple wounds INTERVENTIONS - Wound Dressings X - Small Wound Dressing one or multiple wounds 1 10 []  - 0 Medium Wound Dressing one or multiple wounds []  - 0 Large Wound Dressing one or multiple wounds []  - 0 Application of Medications - topical []  - 0 Application of Medications - injection INTERVENTIONS - Miscellaneous []  - External ear exam 0 []  - 0 Specimen Collection (cultures, biopsies, blood, body fluids, etc.) []  - 0 Specimen(s) / Culture(s) sent or taken to Lab for analysis []  - 0 Patient Transfer (multiple staff / Nurse, adult / Similar devices) []  - 0 Simple Staple / Suture removal (25 or less) []  - 0 Complex Staple / Suture removal (26 or more) []  - 0 Hypo / Hyperglycemic Management (close monitor of Blood Glucose) []  - 0 Ankle / Brachial Index (ABI) - do not check if billed separately []  - 0 Vital Signs Has the patient been seen at the hospital within the last three years: Yes Total Score: 60 Level Of Care: New/Established - Level 2 Electronic Signature(s) Signed: 11/24/2021 4:39:16 PM By: Angelina Pih Entered By: Angelina Pih on 11/24/2021 13:26:45 [name] ([id number]) -------------------------------------------------------------------------------- Encounter Discharge Information Details Patient Name: [name], [name] Date of Service: 11/24/2021 12:45 PM Medical Record Number: [id number] Patient Account Number: [id number] Date of Birth/Sex: July 02, 1941 (80 y.o. F) Treating RN: Angelina Pih Primary Care Lamontae Ricardo: [name] Givens Other Clinician: Referring Tyronica Trux[place]: [name] Givens Treating Bascom Biel/Extender: Altamese [place] in Treatment: 12 Encounter Discharge Information  Items Discharge Condition: Stable Ambulatory Status: Walker Discharge Destination: Home Transportation: Private Auto Accompanied By: self Schedule Follow-up Appointment: Yes Clinical Summary of Care: Electronic Signature(s) Signed: 11/24/2021 4:39:16 PM By: Angelina Pih Entered By: Angelina Pih on 11/24/2021 13:25:17 [name], [name] ([id number]) -------------------------------------------------------------------------------- Wound Assessment Details Patient Name: [name], [name] Date of Service: 11/24/2021 12:45 PM Medical Record Number: [id number] Patient Account Number: [id number] Date of Birth/Sex: 26-May-1941 (80 y.o. F) Treating RN: Angelina Pih Primary Care Jaylend Reiland: [name] Givens Other Clinician: Referring Avana Kreiser: [name] Givens Treating Gracyn Santillanes/Extender: Altamese [place] in Treatment: 12 Wound Status Wound Number: 1 Primary Etiology: Venous Leg Ulcer Wound Location: Left, Anterior Lower Leg Wound Status: Open Wounding Event: Skin Tear/Laceration Comorbid History: Cataracts, Osteoarthritis, Received Radiation Date Acquired: 07/30/2021 Weeks Of Treatment: 12 Clustered Wound: No Wound Measurements Length: (cm) 1.9 Width: (cm) 0.5 Depth: (cm) 0.4 Area: (cm) 0.746 Volume: (cm) 0.298 % Reduction in Area: 40.7% % Reduction in Volume: 52.5% Epithelialization: Small (1-33%) Wound Description Classification: Full Thickness Without Exposed Support Structures Wound Margin: Flat and Intact Exudate Amount: Medium Exudate Type: Serosanguineous Exudate Color: red, brown Foul Odor After Cleansing: No Slough/Fibrino Yes Wound Bed Granulation Amount: Small (1-33%) Exposed Structure Granulation Quality: Red Fascia Exposed: No Necrotic Amount: Medium (34-66%) Fat Layer (Subcutaneous Tissue) Exposed: Yes Necrotic Quality: Adherent Slough Tendon Exposed: No Muscle Exposed: No Joint Exposed: No Bone Exposed: No Treatment Notes Wound #1 (Lower  Leg) Wound Laterality: Left, Anterior Cleanser Byram Ancillary Kit - 15 Day Supply Discharge Instruction: Use supplies as instructed; Kit contains: (15) Saline Bullets; (15) 3x3 Gauze; 15 pr Gloves Soap and Water Discharge Instruction: Gently cleanse wound with antibacterial soap, rinse and pat dry prior to dressing wounds Peri-Wound Care Topical Triamcinolone Acetonide Cream, 0.1%, 15 (g) tube Discharge Instruction: Apply as directed by Toniyah Dilmore. Primary Dressing endoform Discharge Instruction: apply to wound twice a week Secondary Dressing Zetuvit Plus Silicone Border Dressing 4x4 (in/in) [name], [name] ([id number]) Secured  With Compression Wrap Tubi E Compression Stockings Add-Ons Electronic Signature(s) Signed: 11/24/2021 4:39:16 PM By: Angelina Pih Entered By: Angelina Pih on 11/24/2021 13:14:47

## 2021-11-24 NOTE — Progress Notes (Signed)
MAHOGANI, HOLOHAN ([id number]) Visit Report for 11/24/2021 Physician Orders Details Patient Name: [name], [name]. Date of Service: 11/24/2021 12:45 PM Medical Record Number: [id number] Patient Account Number: [id number] Date of Birth/Sex: 03-06-1941 (80 y.o. F) Treating RN: Levora Dredge Primary Care Provider: [name] Other Clinician: Referring Provider: [name] Treating Provider/Extender: Tito Dine in Treatment: 12 Verbal / Phone Orders: No Diagnosis Coding Follow-up Appointments o Return Appointment in 1 week. - once a week provider o Nurse Visit as needed - once per week Bathing/ Shower/ Hygiene o May shower with wound dressing protected with water repellent cover or cast protector. o No tub bath. Anesthetic (Use 'Patient Medications' Section for Anesthetic Order Entry) o Lidocaine applied to wound bed Edema Control - Lymphedema / Segmental Compressive Device / Other o Tubigrip single layer applied. - E o Elevate, Exercise Daily and Avoid Standing for Long Periods of Time. o Elevate legs to the level of the heart and pump ankles as often as possible o Elevate leg(s) parallel to the floor when sitting. Wound Treatment Wound #1 - Lower Leg Wound Laterality: Left, Anterior Cleanser: Byram Ancillary Kit - 15 Day Supply (Generic) 2 x Per Week/30 Days Discharge Instructions: Use supplies as instructed; Kit contains: (15) Saline Bullets; (15) 3x3 Gauze; 15 pr Gloves Cleanser: Soap and Water 2 x Per Week/30 Days Discharge Instructions: Gently cleanse wound with antibacterial soap, rinse and pat dry prior to dressing wounds Topical: Triamcinolone Acetonide Cream, 0.1%, 15 (g) tube 2 x Per Week/30 Days Discharge Instructions: Apply as directed by provider. Primary Dressing: endoform 2 x Per Week/30 Days Discharge Instructions: apply to wound twice a week Secondary Dressing: Zetuvit Plus Silicone Border Dressing 4x4 (in/in) (Generic) 2  x Per Week/30 Days Compression Wrap: Tubi E 2 x Per Week/30 Days Electronic Signature(s) Signed: 11/24/2021 4:08:28 PM By: Linton Ham MD Signed: 11/24/2021 4:39:16 PM By: Levora Dredge Entered By: Levora Dredge on 11/24/2021 13:23:54 [name], [name] ([id number]) -------------------------------------------------------------------------------- [place] Details Patient Name: [name], [name] Date of Service: 11/24/2021 Medical Record Number: [id number] Patient Account Number: [id number] Date of Birth/Sex: 11-May-1941 (80 y.o. 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

## 2021-11-29 ENCOUNTER — Other Ambulatory Visit: Payer: Self-pay

## 2021-11-29 ENCOUNTER — Encounter: Payer: PPO | Attending: Internal Medicine

## 2021-11-29 DIAGNOSIS — M170 Bilateral primary osteoarthritis of knee: Secondary | ICD-10-CM | POA: Diagnosis not present

## 2021-11-29 DIAGNOSIS — R21 Rash and other nonspecific skin eruption: Secondary | ICD-10-CM | POA: Insufficient documentation

## 2021-11-29 DIAGNOSIS — L97822 Non-pressure chronic ulcer of other part of left lower leg with fat layer exposed: Secondary | ICD-10-CM | POA: Diagnosis not present

## 2021-11-29 DIAGNOSIS — I872 Venous insufficiency (chronic) (peripheral): Secondary | ICD-10-CM | POA: Insufficient documentation

## 2021-11-29 DIAGNOSIS — M797 Fibromyalgia: Secondary | ICD-10-CM | POA: Diagnosis not present

## 2021-11-29 DIAGNOSIS — B354 Tinea corporis: Secondary | ICD-10-CM | POA: Diagnosis not present

## 2021-11-29 NOTE — Progress Notes (Signed)
[name], [name] ([id number]) Visit Report for 11/29/2021 Arrival Information Details Patient Name: [name], [name]. Date of Service: 11/29/2021 3:45 PM Medical Record Number: [id number] Patient Account Number: [id number] Date of Birth/Sex: [birth year] (80 y.o. F) Treating RN: Angelina Pih Primary Care Samanda Buske: [name] Givens Other Clinician: Referring Orion Mole: [name] Givens Treating Gricel Copen/Extender: Tilda Franco in Treatment: 12 Visit Information History Since Last Visit Added or deleted any medications: No Patient Arrived: Dan Humphreys Any new allergies or adverse reactions: No Arrival Time: 16:13 Had a fall or experienced change in No Accompanied By: self activities of daily living that may affect Transfer Assistance: None risk of falls: Patient Identification Verified: Yes Hospitalized since last visit: No Secondary Verification Process Completed: Yes Has Dressing in Place as Prescribed: Yes Patient Requires Transmission-Based Precautions: No Pain Present Now: No Patient Has Alerts: No Electronic Signature(s) Signed: 11/29/2021 4:31:39 PM By: Angelina Pih Entered By: Angelina Pih on 11/29/2021 16:14:25 [name], [name] ([id number]) -------------------------------------------------------------------------------- Clinic Level of Care Assessment Details Patient Name: [name], [name] Date of Service: 11/29/2021 3:45 PM Medical Record Number: [id number] Patient Account Number: [id number] Date of Birth/Sex: [birth year] (80 y.o. F) Treating RN: Angelina Pih Primary Care Toyna Erisman: [name] Givens Other Clinician: Referring Sedalia Greeson: [name] Givens Treating Swanson Farnell/Extender: Tilda Franco in Treatment: 12 Clinic Level of Care Assessment Items TOOL 4 Quantity Score X - Use when only an EandM is performed on FOLLOW-UP visit 1 0 ASSESSMENTS - Nursing Assessment / Reassessment []  - Reassessment of Co-morbidities (includes updates in patient status)  0 []  - 0 Reassessment of Adherence to Treatment Plan ASSESSMENTS - Wound and Skin Assessment / Reassessment X - Simple Wound Assessment / Reassessment - one wound 1 5 []  - 0 Complex Wound Assessment / Reassessment - multiple wounds []  - 0 Dermatologic / Skin Assessment (not related to wound area) ASSESSMENTS - Focused Assessment []  - Circumferential Edema Measurements - multi extremities 0 []  - 0 Nutritional Assessment / Counseling / Intervention []  - 0 Lower Extremity Assessment (monofilament, tuning fork, pulses) []  - 0 Peripheral Arterial Disease Assessment (using hand held doppler) ASSESSMENTS - Ostomy and/or Continence Assessment and Care []  - Incontinence Assessment and Management 0 []  - 0 Ostomy Care Assessment and Management (repouching, etc.) PROCESS - Coordination of Care X - Simple Patient / Family Education for ongoing care 1 15 []  - 0 Complex (extensive) Patient / Family Education for ongoing care []  - 0 Staff obtains Chiropractor, Records, Test Results / Process Orders []  - 0 Staff telephones HHA, Nursing Homes / Clarify orders / etc []  - 0 Routine Transfer to another Facility (non-emergent condition) []  - 0 Routine Hospital Admission (non-emergent condition) []  - 0 New Admissions / Manufacturing engineer / Ordering NPWT, Apligraf, etc. []  - 0 Emergency Hospital Admission (emergent condition) X- 1 10 Simple Discharge Coordination []  - 0 Complex (extensive) Discharge Coordination PROCESS - Special Needs []  - Pediatric / Minor Patient Management 0 []  - 0 Isolation Patient Management []  - 0 Hearing / Language / Visual special needs []  - 0 Assessment of Community assistance (transportation, D/C planning, etc.) []  - 0 Additional assistance / Altered mentation []  - 0 Support Surface(s) Assessment (bed, cushion, seat, etc.) INTERVENTIONS - Wound Cleansing / Measurement [name], [name] ([id number]) X- 1 5 Simple Wound Cleansing - one wound []  -  0 Complex Wound Cleansing - multiple wounds []  - 0 Wound Imaging (photographs - any number of wounds) []  - 0 Wound Tracing (instead of photographs) []  - 0 Simple Wound Measurement - one  wound []  - 0 Complex Wound Measurement - multiple wounds INTERVENTIONS - Wound Dressings X - Small Wound Dressing one or multiple wounds 1 10 []  - 0 Medium Wound Dressing one or multiple wounds []  - 0 Large Wound Dressing one or multiple wounds []  - 0 Application of Medications - topical []  - 0 Application of Medications - injection INTERVENTIONS - Miscellaneous []  - External ear exam 0 []  - 0 Specimen Collection (cultures, biopsies, blood, body fluids, etc.) []  - 0 Specimen(s) / Culture(s) sent or taken to Lab for analysis []  - 0 Patient Transfer (multiple staff / Nurse, adult / Similar devices) []  - 0 Simple Staple / Suture removal (25 or less) []  - 0 Complex Staple / Suture removal (26 or more) []  - 0 Hypo / Hyperglycemic Management (close monitor of Blood Glucose) []  - 0 Ankle / Brachial Index (ABI) - do not check if billed separately []  - 0 Vital Signs Has the patient been seen at the hospital within the last three years: Yes Total Score: 45 Level Of Care: New/Established - Level 2 Electronic Signature(s) Signed: 11/29/2021 4:31:39 PM By: Angelina Pih Entered By: Angelina Pih on 11/29/2021 16:30:02 [name], [name] ([id number]) -------------------------------------------------------------------------------- Encounter Discharge Information Details Patient Name: [name], [name] Date of Service: 11/29/2021 3:45 PM Medical Record Number: [id number] Patient Account Number: [id number] Date of Birth/Sex: 05/13/1941 (80 y.o. F) Treating RN: Angelina Pih Primary Care Darleen Moffitt: [name] Givens Other Clinician: Referring Kalila Adkison: [name] Givens Treating Javonn Gauger/Extender: Tilda Franco in Treatment: 12 Encounter Discharge Information Items Discharge Condition:  Stable Ambulatory Status: Walker Discharge Destination: Home Transportation: Private Auto Accompanied By: self Schedule Follow-up Appointment: Yes Clinical Summary of Care: Electronic Signature(s) Signed: 11/29/2021 4:31:39 PM By: Angelina Pih Entered By: Angelina Pih on 11/29/2021 16:29:37 [name], [name] ([id number]) -------------------------------------------------------------------------------- Wound Assessment Details Patient Name: [name], [name] Date of Service: 11/29/2021 3:45 PM Medical Record Number: [id number] Patient Account Number: [id number] Date of Birth/Sex: 1941/08/19 (80 y.o. F) Treating RN: Angelina Pih Primary Care Danialle Dement: [name] Givens Other Clinician: Referring Shayon Trompeter: [name] Givens Treating Miquel Lamson/Extender: Tilda Franco in Treatment: 12 Wound Status Wound Number: 1 Primary Etiology: Venous Leg Ulcer Wound Location: Left, Anterior Lower Leg Wound Status: Open Wounding Event: Skin Tear/Laceration Comorbid History: Cataracts, Osteoarthritis, Received Radiation Date Acquired: 07/30/2021 Weeks Of Treatment: 12 Clustered Wound: No Wound Measurements Length: (cm) 1.9 Width: (cm) 0.5 Depth: (cm) 0.4 Area: (cm) 0.746 Volume: (cm) 0.298 % Reduction in Area: 40.7% % Reduction in Volume: 52.5% Epithelialization: Small (1-33%) Wound Description Classification: Full Thickness Without Exposed Support Structures Wound Margin: Flat and Intact Exudate Amount: Medium Exudate Type: Serosanguineous Exudate Color: red, brown Foul Odor After Cleansing: No Slough/Fibrino Yes Wound Bed Granulation Amount: Small (1-33%) Exposed Structure Granulation Quality: Red Fascia Exposed: No Necrotic Amount: Medium (34-66%) Fat Layer (Subcutaneous Tissue) Exposed: Yes Necrotic Quality: Adherent Slough Tendon Exposed: No Muscle Exposed: No Joint Exposed: No Bone Exposed: No Treatment Notes Wound #1 (Lower Leg) Wound Laterality: Left,  Anterior Cleanser Byram Ancillary Kit - 15 Day Supply Discharge Instruction: Use supplies as instructed; Kit contains: (15) Saline Bullets; (15) 3x3 Gauze; 15 pr Gloves Soap and Water Discharge Instruction: Gently cleanse wound with antibacterial soap, rinse and pat dry prior to dressing wounds Peri-Wound Care Topical Triamcinolone Acetonide Cream, 0.1%, 15 (g) tube Discharge Instruction: Apply as directed by Zola Runion. Primary Dressing endoform Discharge Instruction: apply to wound twice a week Secondary Dressing Zetuvit Plus Silicone Border Dressing 4x4 (in/in) [name], [name] ([id number]) Secured With Compression Wrap  Tubi E Compression Stockings Add-Ons Electronic Signature(s) Signed: 11/29/2021 4:31:39 PM By: Angelina Pih Entered By: Angelina Pih on 11/29/2021 16:28:00

## 2021-11-30 NOTE — Progress Notes (Signed)
MCKINZY, FULLER ([id number]) Visit Report for 11/29/2021 Physician Orders Details Patient Name: [name], [name]. Date of Service: 11/29/2021 3:45 PM Medical Record Number: [id number] Patient Account Number: [id number] Date of Birth/Sex: Jan 13, 1941 (80 y.o. F) Treating RN: Levora Dredge Primary Care Provider: [name] Other Clinician: Referring Provider: [name] Treating Provider/Extender: Yaakov Guthrie in Treatment: 12 Verbal / Phone Orders: No Diagnosis Coding Follow-up Appointments o Return Appointment in 1 week. - once a week provider o Nurse Visit as needed - once per week Bathing/ Shower/ Hygiene o May shower with wound dressing protected with water repellent cover or cast protector. o No tub bath. Anesthetic (Use 'Patient Medications' Section for Anesthetic Order Entry) o Lidocaine applied to wound bed Edema Control - Lymphedema / Segmental Compressive Device / Other o Tubigrip single layer applied. - E o Elevate, Exercise Daily and Avoid Standing for Long Periods of Time. o Elevate legs to the level of the heart and pump ankles as often as possible o Elevate leg(s) parallel to the floor when sitting. Wound Treatment Wound #1 - Lower Leg Wound Laterality: Left, Anterior Cleanser: Byram Ancillary Kit - 15 Day Supply (Generic) 2 x Per Week/30 Days Discharge Instructions: Use supplies as instructed; Kit contains: (15) Saline Bullets; (15) 3x3 Gauze; 15 pr Gloves Cleanser: Soap and Water 2 x Per Week/30 Days Discharge Instructions: Gently cleanse wound with antibacterial soap, rinse and pat dry prior to dressing wounds Topical: Triamcinolone Acetonide Cream, 0.1%, 15 (g) tube 2 x Per Week/30 Days Discharge Instructions: Apply as directed by provider. Primary Dressing: endoform 2 x Per Week/30 Days Discharge Instructions: apply to wound twice a week Secondary Dressing: Zetuvit Plus Silicone Border Dressing 4x4 (in/in) (Generic) 2 x Per  Week/30 Days Compression Wrap: Tubi E 2 x Per Week/30 Days Electronic Signature(s) Signed: 11/29/2021 4:31:39 PM By: Levora Dredge Signed: 11/30/2021 9:41:25 AM By: Kalman Shan DO Entered By: Levora Dredge on 11/29/2021 16:28:54 [place], [place] ([id number]) -------------------------------------------------------------------------------- [place] Details Patient Name: Bergstresser, Desera L. Date of Service: 11/29/2021 Medical Record Number: [id number] Patient Account Number: [id number] Date of Birth/Sex: August 11, 1941 (81 y.o. 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

## 2021-12-04 ENCOUNTER — Encounter: Payer: PPO | Admitting: Physician Assistant

## 2021-12-04 ENCOUNTER — Other Ambulatory Visit: Payer: Self-pay

## 2021-12-04 DIAGNOSIS — I872 Venous insufficiency (chronic) (peripheral): Secondary | ICD-10-CM | POA: Diagnosis not present

## 2021-12-04 DIAGNOSIS — M797 Fibromyalgia: Secondary | ICD-10-CM | POA: Diagnosis not present

## 2021-12-04 DIAGNOSIS — L97822 Non-pressure chronic ulcer of other part of left lower leg with fat layer exposed: Secondary | ICD-10-CM | POA: Diagnosis not present

## 2021-12-04 DIAGNOSIS — M170 Bilateral primary osteoarthritis of knee: Secondary | ICD-10-CM | POA: Diagnosis not present

## 2021-12-04 NOTE — Progress Notes (Addendum)
Treating RN: Carlene Coria Primary Care Angelgabriel Willmore: [name] Other Clinician: Referring Hartlee Amedee: [name] Treating Kynlei Piontek/Extender: Skipper Cliche in Treatment: 13 Wound Status Wound Number: 1 Primary Etiology: Venous Leg Ulcer Wound Location: Left, Anterior Lower Leg Wound Status: Open Wounding Event: Skin Tear/Laceration Comorbid History: Cataracts, Osteoarthritis, Received Radiation Date Acquired: 07/30/2021 Weeks  Of Treatment: 13 Clustered Wound: No Photos Wound Measurements Length: (cm) 2.4 Width: (cm) 0.5 Depth: (cm) 0.4 Area: (cm) 0.942 Volume: (cm) 0.377 % Reduction in Area: 25.1% % Reduction in Volume: 40% Epithelialization: Small (1-33%) Tunneling: No Undermining: No Wound Description Classification: Full Thickness Without Exposed Support Structures Wound Margin: Flat and Intact Exudate Amount: Medium Exudate Type: Serosanguineous Exudate Color: red, brown Foul Odor After Cleansing: No Slough/Fibrino Yes Wound Bed Granulation Amount: Small (1-33%) Exposed Structure Granulation Quality: Red Fascia Exposed: No Necrotic Amount: Medium (34-66%) Fat Layer (Subcutaneous Tissue) Exposed: Yes Necrotic Quality: Adherent Slough Tendon Exposed: No Muscle Exposed: No Joint Exposed: No Bone Exposed: No Treatment Notes Wound #1 (Lower Leg) Wound Laterality: Left, Anterior Cleanser Byram Ancillary Kit - 15 Day Supply Discharge Instruction: Use supplies as instructed; Kit contains: (15) Saline Bullets; (15) 3x3 Gauze; 15 pr Gloves Soap and Water [name], [name] ([id number]) Discharge Instruction: Gently cleanse wound with antibacterial soap, rinse and pat dry prior to dressing wounds Peri-Wound Care Topical Triamcinolone Acetonide Cream, 0.1%, 15 (g) tube Discharge Instruction: Apply as directed by Vanecia Limpert. Primary Dressing Hydrofera Blue Ready Transfer Foam, 2.5x2.5 (in/in) Discharge Instruction: Apply Hydrofera Blue Ready to wound bed as directed Secondary Dressing Gauze Discharge Instruction: As directed: dry, moistened with saline or moistened with Dakins Solution Kerlix 4.5 x 4.1 (in/yd) Discharge Instruction: Apply Kerlix 4.5 x 4.1 (in/yd) as instructed Secured With 30M Medipore H Soft Cloth Surgical Tape, 2x2 (in/yd) Compression Wrap Tubi E Compression Stockings Add-Ons Electronic Signature(s) Signed: 12/05/2021 8:45:44 AM By: Carlene Coria RN Entered By: Carlene Coria on 12/04/2021 11:20:24 [name], [name] ([id number]) -------------------------------------------------------------------------------- Vitals Details Patient Name: [name], [name] Date of Service: 12/04/2021 11:00 AM Medical Record Number: [id number] Patient Account Number: [id number] Date of Birth/Sex: 1940-11-30 (80 y.o. F) Treating RN: Carlene Coria Primary Care Mahek Schlesinger: [name] Other Clinician: Referring Kacin Dancy: [name] Treating Talana Slatten/Extender: Skipper Cliche in Treatment: 13 Vital Signs Time Taken: 11:16 Temperature (F): 97.6 Height (in): 63 Pulse (bpm): 79 Weight (lbs): 198 Respiratory Rate (breaths/min): 18 Body Mass Index (BMI): 35.1 Blood Pressure (mmHg): 188/94 Reference Range: 80 - 120 mg / dl Electronic Signature(s) Signed: 12/05/2021 8:45:44 AM By: Carlene Coria RN Entered By: Carlene Coria on 12/04/2021 11:16:43  Treating RN: Carlene Coria Primary Care Angelgabriel Willmore: [name] Other Clinician: Referring Hartlee Amedee: [name] Treating Kynlei Piontek/Extender: Skipper Cliche in Treatment: 13 Wound Status Wound Number: 1 Primary Etiology: Venous Leg Ulcer Wound Location: Left, Anterior Lower Leg Wound Status: Open Wounding Event: Skin Tear/Laceration Comorbid History: Cataracts, Osteoarthritis, Received Radiation Date Acquired: 07/30/2021 Weeks  Of Treatment: 13 Clustered Wound: No Photos Wound Measurements Length: (cm) 2.4 Width: (cm) 0.5 Depth: (cm) 0.4 Area: (cm) 0.942 Volume: (cm) 0.377 % Reduction in Area: 25.1% % Reduction in Volume: 40% Epithelialization: Small (1-33%) Tunneling: No Undermining: No Wound Description Classification: Full Thickness Without Exposed Support Structures Wound Margin: Flat and Intact Exudate Amount: Medium Exudate Type: Serosanguineous Exudate Color: red, brown Foul Odor After Cleansing: No Slough/Fibrino Yes Wound Bed Granulation Amount: Small (1-33%) Exposed Structure Granulation Quality: Red Fascia Exposed: No Necrotic Amount: Medium (34-66%) Fat Layer (Subcutaneous Tissue) Exposed: Yes Necrotic Quality: Adherent Slough Tendon Exposed: No Muscle Exposed: No Joint Exposed: No Bone Exposed: No Treatment Notes Wound #1 (Lower Leg) Wound Laterality: Left, Anterior Cleanser Byram Ancillary Kit - 15 Day Supply Discharge Instruction: Use supplies as instructed; Kit contains: (15) Saline Bullets; (15) 3x3 Gauze; 15 pr Gloves Soap and Water [name], [name] ([id number]) Discharge Instruction: Gently cleanse wound with antibacterial soap, rinse and pat dry prior to dressing wounds Peri-Wound Care Topical Triamcinolone Acetonide Cream, 0.1%, 15 (g) tube Discharge Instruction: Apply as directed by Vanecia Limpert. Primary Dressing Hydrofera Blue Ready Transfer Foam, 2.5x2.5 (in/in) Discharge Instruction: Apply Hydrofera Blue Ready to wound bed as directed Secondary Dressing Gauze Discharge Instruction: As directed: dry, moistened with saline or moistened with Dakins Solution Kerlix 4.5 x 4.1 (in/yd) Discharge Instruction: Apply Kerlix 4.5 x 4.1 (in/yd) as instructed Secured With 30M Medipore H Soft Cloth Surgical Tape, 2x2 (in/yd) Compression Wrap Tubi E Compression Stockings Add-Ons Electronic Signature(s) Signed: 12/05/2021 8:45:44 AM By: Carlene Coria RN Entered By: Carlene Coria on 12/04/2021 11:20:24 [name], [name] ([id number]) -------------------------------------------------------------------------------- Vitals Details Patient Name: [name], [name] Date of Service: 12/04/2021 11:00 AM Medical Record Number: [id number] Patient Account Number: [id number] Date of Birth/Sex: 1940-11-30 (80 y.o. F) Treating RN: Carlene Coria Primary Care Mahek Schlesinger: [name] Other Clinician: Referring Kacin Dancy: [name] Treating Talana Slatten/Extender: Skipper Cliche in Treatment: 13 Vital Signs Time Taken: 11:16 Temperature (F): 97.6 Height (in): 63 Pulse (bpm): 79 Weight (lbs): 198 Respiratory Rate (breaths/min): 18 Body Mass Index (BMI): 35.1 Blood Pressure (mmHg): 188/94 Reference Range: 80 - 120 mg / dl Electronic Signature(s) Signed: 12/05/2021 8:45:44 AM By: Carlene Coria RN Entered By: Carlene Coria on 12/04/2021 11:16:43  Treating RN: Carlene Coria Primary Care Angelgabriel Willmore: [name] Other Clinician: Referring Hartlee Amedee: [name] Treating Kynlei Piontek/Extender: Skipper Cliche in Treatment: 13 Wound Status Wound Number: 1 Primary Etiology: Venous Leg Ulcer Wound Location: Left, Anterior Lower Leg Wound Status: Open Wounding Event: Skin Tear/Laceration Comorbid History: Cataracts, Osteoarthritis, Received Radiation Date Acquired: 07/30/2021 Weeks  Of Treatment: 13 Clustered Wound: No Photos Wound Measurements Length: (cm) 2.4 Width: (cm) 0.5 Depth: (cm) 0.4 Area: (cm) 0.942 Volume: (cm) 0.377 % Reduction in Area: 25.1% % Reduction in Volume: 40% Epithelialization: Small (1-33%) Tunneling: No Undermining: No Wound Description Classification: Full Thickness Without Exposed Support Structures Wound Margin: Flat and Intact Exudate Amount: Medium Exudate Type: Serosanguineous Exudate Color: red, brown Foul Odor After Cleansing: No Slough/Fibrino Yes Wound Bed Granulation Amount: Small (1-33%) Exposed Structure Granulation Quality: Red Fascia Exposed: No Necrotic Amount: Medium (34-66%) Fat Layer (Subcutaneous Tissue) Exposed: Yes Necrotic Quality: Adherent Slough Tendon Exposed: No Muscle Exposed: No Joint Exposed: No Bone Exposed: No Treatment Notes Wound #1 (Lower Leg) Wound Laterality: Left, Anterior Cleanser Byram Ancillary Kit - 15 Day Supply Discharge Instruction: Use supplies as instructed; Kit contains: (15) Saline Bullets; (15) 3x3 Gauze; 15 pr Gloves Soap and Water [name], [name] ([id number]) Discharge Instruction: Gently cleanse wound with antibacterial soap, rinse and pat dry prior to dressing wounds Peri-Wound Care Topical Triamcinolone Acetonide Cream, 0.1%, 15 (g) tube Discharge Instruction: Apply as directed by Vanecia Limpert. Primary Dressing Hydrofera Blue Ready Transfer Foam, 2.5x2.5 (in/in) Discharge Instruction: Apply Hydrofera Blue Ready to wound bed as directed Secondary Dressing Gauze Discharge Instruction: As directed: dry, moistened with saline or moistened with Dakins Solution Kerlix 4.5 x 4.1 (in/yd) Discharge Instruction: Apply Kerlix 4.5 x 4.1 (in/yd) as instructed Secured With 30M Medipore H Soft Cloth Surgical Tape, 2x2 (in/yd) Compression Wrap Tubi E Compression Stockings Add-Ons Electronic Signature(s) Signed: 12/05/2021 8:45:44 AM By: Carlene Coria RN Entered By: Carlene Coria on 12/04/2021 11:20:24 [name], [name] ([id number]) -------------------------------------------------------------------------------- Vitals Details Patient Name: [name], [name] Date of Service: 12/04/2021 11:00 AM Medical Record Number: [id number] Patient Account Number: [id number] Date of Birth/Sex: 1940-11-30 (80 y.o. F) Treating RN: Carlene Coria Primary Care Mahek Schlesinger: [name] Other Clinician: Referring Kacin Dancy: [name] Treating Talana Slatten/Extender: Skipper Cliche in Treatment: 13 Vital Signs Time Taken: 11:16 Temperature (F): 97.6 Height (in): 63 Pulse (bpm): 79 Weight (lbs): 198 Respiratory Rate (breaths/min): 18 Body Mass Index (BMI): 35.1 Blood Pressure (mmHg): 188/94 Reference Range: 80 - 120 mg / dl Electronic Signature(s) Signed: 12/05/2021 8:45:44 AM By: Carlene Coria RN Entered By: Carlene Coria on 12/04/2021 11:16:43  one wound '[]'  - 0 Complex Wound Cleansing - multiple wounds X- 1 5 Wound Imaging (photographs - any number of wounds) '[]'  - 0 Wound Tracing (instead of photographs) X- 1 5 Simple Wound Measurement - one wound '[]'  - 0 Complex Wound Measurement - multiple wounds INTERVENTIONS - Wound Dressings X - Small Wound Dressing one or multiple wounds 1 10 '[]'  - 0 Medium Wound Dressing one or multiple wounds '[]'  - 0 Large Wound Dressing one or multiple wounds X- 1 5 Application of Medications - topical '[]'  - 0 Application of Medications - injection INTERVENTIONS - Miscellaneous '[]'  - External ear exam 0 '[]'  - 0 Specimen Collection (cultures, biopsies, blood, body fluids, etc.) '[]'  - 0 Specimen(s) / Culture(s) sent or taken to Lab for analysis '[]'  - 0 Patient Transfer (multiple staff / [employer] fast streamer / Similar devices) '[]'  - 0 Simple Staple / Suture removal (25 or less) '[]'  - 0 Complex Staple / Suture removal (26 or more) '[]'  - 0 Hypo / Hyperglycemic Management (close monitor of Blood Glucose) '[]'  - 0 Ankle / Brachial Index (ABI) - do not check if billed separately X- 1 5 Vital Signs Has the patient been seen at the hospital within the last three years: Yes Total Score: 80 Level Of Care: New/Established - Level 3 Electronic Signature(s) Signed: 12/05/2021 8:45:44 AM By: Carlene Coria RN Entered By: Carlene Coria on 12/04/2021 11:42:03 [name], [name] ([id number]) -------------------------------------------------------------------------------- Encounter Discharge Information Details Patient Name: [name], [name] Date of Service: 12/04/2021 11:00 AM Medical Record Number: [id number] Patient Account Number: [id number] Date of Birth/Sex: 08-27-1941 (80 y.o. F) Treating RN: Carlene Coria Primary Care Shonette Rhames: [name] Other Clinician: Referring Jubilee Vivero: [name] Treating Royann Wildasin/Extender: Skipper Cliche in Treatment: 13 Encounter Discharge Information Items Discharge Condition: Stable Ambulatory Status: Ambulatory Discharge Destination: Home Transportation: Private Auto Accompanied By: self Schedule Follow-up Appointment: Yes Clinical Summary of Care: Patient Declined Electronic Signature(s) Signed: 12/05/2021 8:45:44 AM By: Carlene Coria RN Entered By: Carlene Coria on 12/04/2021 11:44:01 [name], [name] ([id number]) -------------------------------------------------------------------------------- Lower Extremity Assessment Details Patient Name: [name], [name] Date of Service: 12/04/2021 11:00 AM Medical Record Number: [id number] Patient Account Number: [id number] Date of Birth/Sex: May 26, 1941 (80 y.o. F) Treating RN: Carlene Coria Primary Care Carsynn Bethune: [name] Other Clinician: Referring Miamor Ayler: [name] Treating Azeez Dunker/Extender: Jeri Cos Weeks in Treatment: 13 Edema Assessment Assessed: [Left: No] Patrice Paradise: No] Edema: [Left: Ye] [Right: s] Calf Left: Right: Point of Measurement: 31 cm From Medial Instep 38 cm Ankle Left: Right: Point of Measurement: 11 cm From Medial Instep 24.5 cm Vascular Assessment Pulses: Dorsalis Pedis Palpable: [Left:Yes] Electronic Signature(s) Signed: 12/05/2021 8:45:44 AM By: Carlene Coria RN Entered By: Carlene Coria on 12/04/2021 11:21:08 [name], [name] ([id number]) -------------------------------------------------------------------------------- Multi Wound Chart Details Patient Name: [name], [name] Date of Service: 12/04/2021 11:00 AM Medical Record Number: [id number] Patient Account Number: [id number] Date of Birth/Sex: August 22, 1941 (80 y.o. F) Treating RN: Carlene Coria Primary Care Adolfo Granieri: [name] Other Clinician: Referring Faolan [place]: [name] Treating  Arul Farabee/Extender: Skipper Cliche in Treatment: 13 Vital Signs Height(in): 63 Pulse(bpm): 11 Weight(lbs): 198 Blood Pressure(mmHg): 188/94 Body Mass Index(BMI): 35 Temperature(F): 97.6 Respiratory Rate(breaths/min): 18 Photos: [N/A:N/A] Wound Location: Left, Anterior Lower Leg N/A N/A Wounding Event: Skin Tear/Laceration N/A N/A Primary Etiology: Venous Leg Ulcer N/A N/A Comorbid History: Cataracts, Osteoarthritis, Received N/A N/A Radiation Date Acquired: 07/30/2021 N/A N/A Weeks of Treatment: 13 N/A N/A Wound Status: Open N/A N/A Measurements L x W x D (cm) 2.4x0.5x0.4 N/A

## 2021-12-04 NOTE — Progress Notes (Addendum)
[name] ([id number]) Visit Report for 12/04/2021 Chief Complaint Document Details Patient Name: [name], [name] Date of Service: 12/04/2021 11:00 AM Medical Record Number: [id number] Patient Account Number: [id number] Date of Birth/Sex: [birth year] (81 y.o. F) Treating RN: Carlene Coria Primary Care Provider: [name] Other Clinician: Referring Provider: [name] Treating Provider/Extender: Skipper Cliche in Treatment: 13 Information Obtained from: Patient Chief Complaint Left LE Ulcer Electronic Signature(s) Signed: 12/04/2021 11:06:04 AM By: Worthy Keeler PA-C Entered By: Worthy Keeler on 12/04/2021 11:06:04 [name], [name] ([id number]) -------------------------------------------------------------------------------- HPI Details Patient Name: [name], [name] Date of Service: 12/04/2021 11:00 AM Medical Record Number: [id number] Patient Account Number: [id number] Date of Birth/Sex: November 02, 1941 (81 y.o. F) Treating RN: Carlene Coria Primary Care Provider: [name] Other Clinician: Referring Provider: [name] Treating Provider/Extender: Skipper Cliche in Treatment: 13 History of Present Illness HPI Description: 08/31/2021 upon evaluation today patient presents for initial inspection here in our clinic concerning issues that she is having with a wound on her left anterior lower extremity. This is actually an area that she unfortunately tells me she was picking up a cage and dropped hitting the anterior portion of her shin causing a skin tear. She did not go to the hospital due to the fact that she did not realize that she would need to. This happened on July 30, 2021. With that being said the patient has been on Augmentin she has about 1 and half days of this left. Nonetheless she tells me that she does have a longstanding history of leg swelling. She does have chronic venous insufficiency as well it appears to me. She also has some  evidence of may be early stages of lymphedema. She has a history of fibromyalgia, bilateral knee osteoarthritis, and appears to have a fungal infection of the right ankle region. In regard to the arthritis she tells me her legs tend to swell less after she gets her knee injections allowing her to be able to walk and get around much more effectively. Following the injections she does well for a while and then when that seems to be wearing off she tends to start having issues with more swelling because she is more mobile that makes sense from a venous standpoint. 10/10; I was asked to see this patient today who came in for a nurse visit. Firstly she has developed a widespread very pruritic rash involving her abdomen and back. Lesser extent on her feet. The second problem is that she is complaining about the tightness of the wound and wrap on the left leg where the wound is. 09/14/2021 upon evaluation today patient appears to be doing decently well in regard to her leg ulcer. The biggest issue she is having is a significant issue with a rash currently. I do not see any signs of active infection systemically which is great news although with regard to the rash she did see dermatology they told her she had eczema that does not seem to sit right with me out think that is mainly the issues she has going on. I do think she may benefit from the use of a short course of prednisone which should help with the rash which is absolutely driving her crazy. 09/21/2021 upon evaluation today patient appears to be doing decently well in regard to her wound this is still pretty deep. The prednisone has helped with the rash although is not completely done and it is doing much better than what it was. Fortunately there is  no signs of active infection systemically at this time also great news. 09/28/2021 upon evaluation today patient's wound on her leg actually showing some signs of improvement which is good news I am happy  in that regard. Fortunately there does not appear to be any evidence of active infection systemically which is great news. Unfortunately she is still continuing to have issues with her rash which is almost worse than the actual wound itself for her at this point. 10/05/2021 upon evaluation today patient appears to be doing well with regard to her leg ulcer. She is tolerating the dressing changes without complication and overall I am extremely pleased with where we stand. With that being said I do think that she could possibly benefit from a snap VAC if we can gain approval I am not certain if this will be improved or not. Nonetheless I definitely think we can give this a try and if we get approval I think this will help speed up the healing process to be honest. In the interim I do believe the [facility] is doing decently well. 10/10/2021 upon evaluation today patient appears to be doing well with regard to her wound although is not significantly smaller. We still have quite a bit of depth here. Fortunately there is no signs of active infection systemically at this time. No fevers, chills, nausea, vomiting, or diarrhea. 10/17/2021 upon evaluation today patient appears to be doing poorly in regard to her leg compared to last time there is a lot of irritation today. She actually kept the Zetuvit on all week which does not seem to have been beneficial for her. She was supposed to change it at home we did order her supplies but she did not even find the supplies on her porch until she tells me yesterday. Either way we had already decided that the best thing was probably can to be to have her come in for nurse visits. I think that still probably the best thing to do I feel like there is a bit of dementia here that I was not aware of last week or even the previous visit to be honest that is going to prohibit her from being able to make these dressing changes herself. 10/27/2021 upon evaluation today  patient appears to be doing well with regard to her wound currently. She is actually showing signs of excellent improvement and I do feel like the collagen is doing a great job. I am extremely pleased with where we stand today. 11/03/2021 upon evaluation patient's wound bed actually showed signs of good granulation epithelization at this point. I am actually extremely pleased with where we stand and how this is progressing I think the patient is making great progress. She is very happy with today's findings. 12/27. This is a patient with wounds on her left lateral lower extremity. She has 2 open wounds. She did not tolerate 3 layer compression we have therefore been using Tubigrip. We use dermacol last week but we have endoform to use this week 12/04/2021 upon evaluation today patient appears to be doing well with regard to her wound. There is still some irritation around where she has a small area that she bumped inferior to where the original wound was. This is not appearing to be too significant at this point which is good news. Electronic Signature(s) Signed: 12/04/2021 11:59:09 AM By: Worthy Keeler PA-C Entered By: Worthy Keeler on 12/04/2021 11:59:09 [name], [name] ([id number]) -------------------------------------------------------------------------------- Physical Exam Details Patient Name: Levon Hedger  L. Date of Service: 12/04/2021 11:00 AM Medical Record Number: [id number] Patient Account Number: [id number] Date of Birth/Sex: January 04, 1941 (81 y.o. F) Treating RN: Carlene Coria Primary Care Provider: [name] Other Clinician: Referring Provider: [name] Treating Provider/Extender: Skipper Cliche in Treatment: 48 Constitutional Well-nourished and well-hydrated in no acute distress. Respiratory normal breathing without difficulty. Psychiatric this patient is able to make decisions and demonstrates good insight into disease process. Alert and Oriented x 3. pleasant  and cooperative. Notes Upon inspection patient's wound bed showed signs of good granulation and epithelization at this point. Fortunately I do not see any evidence of infection or active significant drainage which is good news and overall I think that we are headed in the right direction. The patient is very pleased but she does note she would like to cut back on the number of time she comes to the clinic. Obviously when we get to that point I will be happy to do it right now I am concerned that if we cut back too soon that we cannot backtrack in a lot of ways with the progress we have made. Electronic Signature(s) Signed: 12/04/2021 11:59:44 AM By: Worthy Keeler PA-C Entered By: Worthy Keeler on 12/04/2021 11:59:44 Yawn, [name] ([id number]) -------------------------------------------------------------------------------- Physician Orders Details Patient Name: [name], [name] Date of Service: 12/04/2021 11:00 AM Medical Record Number: [id number] Patient Account Number: [id number] Date of Birth/Sex: 13-Jul-1941 (81 y.o. F) Treating RN: Carlene Coria Primary Care Provider: [name] Other Clinician: Referring Provider: [name] Treating Provider/Extender: Skipper Cliche in Treatment: 13 Verbal / Phone Orders: No Diagnosis Coding ICD-10 Coding Code Description I87.2 Venous insufficiency (chronic) (peripheral) L97.822 Non-pressure chronic ulcer of other part of left lower leg with fat layer exposed M79.7 Fibromyalgia M17.0 Bilateral primary osteoarthritis of knee B35.4 Tinea corporis R21 Rash and other nonspecific skin eruption Follow-up Appointments o Return Appointment in 1 week. - once a week provider o Nurse Visit as needed - once per week Bathing/ Shower/ Hygiene o May shower with wound dressing protected with water repellent cover or cast protector. o No tub bath. Anesthetic (Use 'Patient Medications' Section for Anesthetic Order Entry) o  Lidocaine applied to wound bed Edema Control - Lymphedema / Segmental Compressive Device / Other o Tubigrip single layer applied. - E o Elevate, Exercise Daily and Avoid Standing for Long Periods of Time. o Elevate legs to the level of the heart and pump ankles as often as possible o Elevate leg(s) parallel to the floor when sitting. Wound Treatment Wound #1 - Lower Leg Wound Laterality: Left, Anterior Cleanser: Byram Ancillary Kit - 15 Day Supply (Generic) 2 x Per Week/30 Days Discharge Instructions: Use supplies as instructed; Kit contains: (15) Saline Bullets; (15) 3x3 Gauze; 15 pr Gloves Cleanser: Soap and Water 2 x Per Week/30 Days Discharge Instructions: Gently cleanse wound with antibacterial soap, rinse and pat dry prior to dressing wounds Topical: Triamcinolone Acetonide Cream, 0.1%, 15 (g) tube 2 x Per Week/30 Days Discharge Instructions: Apply as directed by provider. Primary Dressing: Hydrofera Blue Ready Transfer Foam, 2.5x2.5 (in/in) 2 x Per Week/30 Days Discharge Instructions: Apply Hydrofera Blue Ready to wound bed as directed Secondary Dressing: Gauze 2 x Per Week/30 Days Discharge Instructions: As directed: dry, moistened with saline or moistened with Dakins Solution Secondary Dressing: Kerlix 4.5 x 4.1 (in/yd) 2 x Per Week/30 Days Discharge Instructions: Apply Kerlix 4.5 x 4.1 (in/yd) as instructed Secured With: 77M Medipore H Soft Cloth Surgical Tape, 2x2 (in/yd) 2 x Per  Week/30 Days Compression Wrap: Tubi E 2 x Per Week/30 Days Electronic Signature(s) DHANA, TOTTON ([id number]) Signed: 12/04/2021 3:33:26 PM By: Worthy Keeler PA-C Signed: 12/05/2021 8:45:44 AM By: Carlene Coria RN Entered By: Carlene Coria on 12/04/2021 11:36:51 [name], [name] ([id number]) -------------------------------------------------------------------------------- Problem List Details Patient Name: Vandervoort, Jeilani L. Date of Service: 12/04/2021 11:00 AM Medical Record Number:  [id number] Patient Account Number: [id number] Date of Birth/Sex: August 10, 1941 (81 y.o. F) Treating RN: Carlene Coria Primary Care Provider: [name] Other Clinician: Referring Provider: [name] Treating Provider/Extender: Skipper Cliche in Treatment: 13 Active Problems ICD-10 Encounter Code Description Active Date MDM Diagnosis I87.2 Venous insufficiency (chronic) (peripheral) 08/31/2021 No Yes L97.822 Non-pressure chronic ulcer of other part of left lower leg with fat layer 08/31/2021 No Yes exposed M79.7 Fibromyalgia 08/31/2021 No Yes M17.0 Bilateral primary osteoarthritis of knee 08/31/2021 No Yes B35.4 Tinea corporis 08/31/2021 No Yes R21 Rash and other nonspecific skin eruption 09/04/2021 No Yes Inactive Problems Resolved Problems Electronic Signature(s) Signed: 12/04/2021 11:05:58 AM By: Worthy Keeler PA-C Entered By: Worthy Keeler on 12/04/2021 11:05:58 [name], [name] ([id number]) -------------------------------------------------------------------------------- Progress Note Details Patient Name: [name], [name] Date of Service: 12/04/2021 11:00 AM Medical Record Number: [id number] Patient Account Number: [id number] Date of Birth/Sex: [birth year] (81 y.o. F) Treating RN: Carlene Coria Primary Care Provider: [name] Other Clinician: Referring Provider: [name] Treating Provider/Extender: Skipper Cliche in Treatment: 13 Subjective Chief Complaint Information obtained from Patient Left LE Ulcer History of Present Illness (HPI) 08/31/2021 upon evaluation today patient presents for initial inspection here in our clinic concerning issues that she is having with a wound on her left anterior lower extremity. This is actually an area that she unfortunately tells me she was picking up a cage and dropped hitting the anterior portion of her shin causing a skin tear. She did not go to the hospital due to the fact that she did not realize that she would  need to. This happened on July 30, 2021. With that being said the patient has been on Augmentin she has about 1 and half days of this left. Nonetheless she tells me that she does have a longstanding history of leg swelling. She does have chronic venous insufficiency as well it appears to me. She also has some evidence of may be early stages of lymphedema. She has a history of fibromyalgia, bilateral knee osteoarthritis, and appears to have a fungal infection of the right ankle region. In regard to the arthritis she tells me her legs tend to swell less after she gets her knee injections allowing her to be able to walk and get around much more effectively. Following the injections she does well for a while and then when that seems to be wearing off she tends to start having issues with more swelling because she is more mobile that makes sense from a venous standpoint. 10/10; I was asked to see this patient today who came in for a nurse visit. Firstly she has developed a widespread very pruritic rash involving her abdomen and back. Lesser extent on her feet. The second problem is that she is complaining about the tightness of the wound and wrap on the left leg where the wound is. 09/14/2021 upon evaluation today patient appears to be doing decently well in regard to her leg ulcer. The biggest issue she is having is a significant issue with a rash currently. I do not see any signs of active infection systemically which is great news  although with regard to the rash she did see dermatology they told her she had eczema that does not seem to sit right with me out think that is mainly the issues she has going on. I do think she may benefit from the use of a short course of prednisone which should help with the rash which is absolutely driving her crazy. 09/21/2021 upon evaluation today patient appears to be doing decently well in regard to her wound this is still pretty deep. The prednisone has helped  with the rash although is not completely done and it is doing much better than what it was. Fortunately there is no signs of active infection systemically at this time also great news. 09/28/2021 upon evaluation today patient's wound on her leg actually showing some signs of improvement which is good news I am happy in that regard. Fortunately there does not appear to be any evidence of active infection systemically which is great news. Unfortunately she is still continuing to have issues with her rash which is almost worse than the actual wound itself for her at this point. 10/05/2021 upon evaluation today patient appears to be doing well with regard to her leg ulcer. She is tolerating the dressing changes without complication and overall I am extremely pleased with where we stand. With that being said I do think that she could possibly benefit from a snap VAC if we can gain approval I am not certain if this will be improved or not. Nonetheless I definitely think we can give this a try and if we get approval I think this will help speed up the healing process to be honest. In the interim I do believe the [facility] is doing decently well. 10/10/2021 upon evaluation today patient appears to be doing well with regard to her wound although is not significantly smaller. We still have quite a bit of depth here. Fortunately there is no signs of active infection systemically at this time. No fevers, chills, nausea, vomiting, or diarrhea. 10/17/2021 upon evaluation today patient appears to be doing poorly in regard to her leg compared to last time there is a lot of irritation today. She actually kept the Zetuvit on all week which does not seem to have been beneficial for her. She was supposed to change it at home we did order her supplies but she did not even find the supplies on her porch until she tells me yesterday. Either way we had already decided that the best thing was probably can to be to have her  come in for nurse visits. I think that still probably the best thing to do I feel like there is a bit of dementia here that I was not aware of last week or even the previous visit to be honest that is going to prohibit her from being able to make these dressing changes herself. 10/27/2021 upon evaluation today patient appears to be doing well with regard to her wound currently. She is actually showing signs of excellent improvement and I do feel like the collagen is doing a great job. I am extremely pleased with where we stand today. 11/03/2021 upon evaluation patient's wound bed actually showed signs of good granulation epithelization at this point. I am actually extremely pleased with where we stand and how this is progressing I think the patient is making great progress. She is very happy with today's findings. 12/27. This is a patient with wounds on her left lateral lower extremity. She has 2 open wounds.  She did not tolerate 3 layer compression we have therefore been using Tubigrip. We use dermacol last week but we have endoform to use this week 12/04/2021 upon evaluation today patient appears to be doing well with regard to her wound. There is still some irritation around where she has a small area that she bumped inferior to where the original wound was. This is not appearing to be too significant at this point which is good news. [name], [name] ([id number]) Objective Constitutional Well-nourished and well-hydrated in no acute distress. Vitals Time Taken: 11:16 AM, Height: 63 in, Weight: 198 lbs, BMI: 35.1, Temperature: 97.6 F, Pulse: 79 bpm, Respiratory Rate: 18 breaths/min, Blood Pressure: 188/94 mmHg. Respiratory normal breathing without difficulty. Psychiatric this patient is able to make decisions and demonstrates good insight into disease process. Alert and Oriented x 3. pleasant and cooperative. General Notes: Upon inspection patient's wound bed showed signs of good granulation  and epithelization at this point. Fortunately I do not see any evidence of infection or active significant drainage which is good news and overall I think that we are headed in the right direction. The patient is very pleased but she does note she would like to cut back on the number of time she comes to the clinic. Obviously when we get to that point I will be happy to do it right now I am concerned that if we cut back too soon that we cannot backtrack in a lot of ways with the progress we have made. Integumentary (Hair, Skin) Wound #1 status is Open. Original cause of wound was Skin Tear/Laceration. The date acquired was: 07/30/2021. The wound has been in treatment 13 weeks. The wound is located on the Left,Anterior Lower Leg. The wound measures 2.4cm length x 0.5cm width x 0.4cm depth; 0.942cm^2 area and 0.377cm^3 volume. There is Fat Layer (Subcutaneous Tissue) exposed. There is no tunneling or undermining noted. There is a medium amount of serosanguineous drainage noted. The wound margin is flat and intact. There is small (1-33%) red granulation within the wound bed. There is a medium (34-66%) amount of necrotic tissue within the wound bed including Adherent Slough. Assessment Active Problems ICD-10 Venous insufficiency (chronic) (peripheral) Non-pressure chronic ulcer of other part of left lower leg with fat layer exposed Fibromyalgia Bilateral primary osteoarthritis of knee Tinea corporis Rash and other nonspecific skin eruption Plan Follow-up Appointments: Return Appointment in 1 week. - once a week provider Nurse Visit as needed - once per week Bathing/ Shower/ Hygiene: May shower with wound dressing protected with water repellent cover or cast protector. No tub bath. Anesthetic (Use 'Patient Medications' Section for Anesthetic Order Entry): Lidocaine applied to wound bed Edema Control - Lymphedema / Segmental Compressive Device / Other: Tubigrip single layer applied. -  E Elevate, Exercise Daily and Avoid Standing for Long Periods of Time. Elevate legs to the level of the heart and pump ankles as often as possible Elevate leg(s) parallel to the floor when sitting. WOUND #1: - Lower Leg Wound Laterality: Left, Anterior Cleanser: Byram Ancillary Kit - 15 Day Supply (Generic) 2 x Per Week/30 Days Discharge Instructions: Use supplies as instructed; Kit contains: (15) Saline Bullets; (15) 3x3 Gauze; 15 pr Gloves Cleanser: Soap and Water 2 x Per Week/30 Days Discharge Instructions: Gently cleanse wound with antibacterial soap, rinse and pat dry prior to dressing wounds Topical: Triamcinolone Acetonide Cream, 0.1%, 15 (g) tube 2 x Per Week/30 Days Discharge Instructions: Apply as directed by provider. Primary Dressing: Hydrofera Blue Ready Transfer  Foam, 2.5x2.5 (in/in) 2 x Per Week/30 Days Discharge Instructions: Apply Hydrofera Blue Ready to wound bed as directed Secondary Dressing: Gauze 2 x Per Week/30 Days [name], [name] ([id number]) Discharge Instructions: As directed: dry, moistened with saline or moistened with Dakins Solution Secondary Dressing: Kerlix 4.5 x 4.1 (in/yd) 2 x Per Week/30 Days Discharge Instructions: Apply Kerlix 4.5 x 4.1 (in/yd) as instructed Secured With: 59M Medipore H Soft Cloth Surgical Tape, 2x2 (in/yd) 2 x Per Week/30 Days Compression Wrap: Tubi E 2 x Per Week/30 Days 1. Would recommend currently that we going continue with the wound care measures as before with regard to the Tubigrip I think this is doing a good job for her. 2. With regard to the dressing I think I Minna switch away from the endoform and back to the [facility] which I think may do better there is little bit of drainage that does not seem to be controlled with just the current measures were also can use an ABD pad and roll gauze to secure in place with the Tubigrip. We will see patient back for reevaluation in 1 week here in the clinic. If anything worsens or  changes patient will contact our office for additional recommendations. Electronic Signature(s) Signed: 12/04/2021 12:00:41 PM By: Worthy Keeler PA-C Entered By: Worthy Keeler on 12/04/2021 12:00:40 [place], [name] ([id number]) -------------------------------------------------------------------------------- SuperBill Details Patient Name: [name], [name] Date of Service: 12/04/2021 Medical Record Number: [id number] Patient Account Number: [id number] Date of Birth/Sex: 08-23-1941 (81 y.o. F) Treating RN: Carlene Coria Primary Care Provider: [name] Other Clinician: Referring Provider: [name] Treating Provider/Extender: Skipper Cliche in Treatment: 13 Diagnosis Coding ICD-10 Codes Code Description I87.2 Venous insufficiency (chronic) (peripheral) L97.822 Non-pressure chronic ulcer of other part of left lower leg with fat layer exposed M79.7 Fibromyalgia M17.0 Bilateral primary osteoarthritis of knee B35.4 Tinea corporis R21 Rash and other nonspecific skin eruption Facility Procedures CPT4 Code: [id number] Description: 99213 - WOUND CARE VISIT-LEV 3 EST PT Modifier: Quantity: 1 Physician Procedures CPT4 Code: 7356701 Description: 41030 - WC PHYS LEVEL 3 - EST PT Modifier: Quantity: 1 CPT4 Code: Description: ICD-10 Diagnosis Description 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

## 2021-12-06 ENCOUNTER — Ambulatory Visit: Payer: PPO

## 2021-12-07 ENCOUNTER — Other Ambulatory Visit: Payer: Self-pay

## 2021-12-07 ENCOUNTER — Encounter: Payer: PPO | Admitting: Physician Assistant

## 2021-12-07 ENCOUNTER — Ambulatory Visit: Payer: Self-pay

## 2021-12-07 ENCOUNTER — Ambulatory Visit: Payer: PPO

## 2021-12-07 DIAGNOSIS — M797 Fibromyalgia: Secondary | ICD-10-CM | POA: Diagnosis not present

## 2021-12-07 DIAGNOSIS — I872 Venous insufficiency (chronic) (peripheral): Secondary | ICD-10-CM | POA: Diagnosis not present

## 2021-12-07 DIAGNOSIS — M170 Bilateral primary osteoarthritis of knee: Secondary | ICD-10-CM | POA: Diagnosis not present

## 2021-12-07 DIAGNOSIS — L97822 Non-pressure chronic ulcer of other part of left lower leg with fat layer exposed: Secondary | ICD-10-CM | POA: Diagnosis not present

## 2021-12-07 NOTE — Progress Notes (Addendum)
KARMINA, ZUFALL ([id number]) Visit Report for 12/07/2021 Chief Complaint Document Details Patient Name: [name], [name] Date of Service: 12/07/2021 8:00 AM Medical Record Number: [id number] Patient Account Number: [id number] Date of Birth/Sex: 1940-12-24 (81 y.o. F) Treating RN: Carlene Coria Primary Care Provider: [name] Other Clinician: Referring Provider: [name] Treating Provider/Extender: Skipper Cliche in Treatment: 14 Information Obtained from: Patient Chief Complaint Left LE Ulcer Electronic Signature(s) Signed: 12/07/2021 8:20:25 AM By: Worthy Keeler PA-C Entered By: Worthy Keeler on 12/07/2021 08:20:25 [name], [name] ([id number]) -------------------------------------------------------------------------------- HPI Details Patient Name: [name], [name] Date of Service: 12/07/2021 8:00 AM Medical Record Number: [id number] Patient Account Number: [id number] Date of Birth/Sex: October 05, 1941 (81 y.o. F) Treating RN: Carlene Coria Primary Care Provider: [name] Other Clinician: Referring Provider: [name] Treating Provider/Extender: Skipper Cliche in Treatment: 14 History of Present Illness HPI Description: 08/31/2021 upon evaluation today patient presents for initial inspection here in our clinic concerning issues that she is having with a wound on her left anterior lower extremity. This is actually an area that she unfortunately tells me she was picking up a cage and dropped hitting the anterior portion of her shin causing a skin tear. She did not go to the hospital due to the fact that she did not realize that she would need to. This happened on July 30, 2021. With that being said the patient has been on Augmentin she has about 1 and half days of this left. Nonetheless she tells me that she does have a longstanding history of leg swelling. She does have chronic venous insufficiency as well it appears to me. She also has some  evidence of may be early stages of lymphedema. She has a history of fibromyalgia, bilateral knee osteoarthritis, and appears to have a fungal infection of the right ankle region. In regard to the arthritis she tells me her legs tend to swell less after she gets her knee injections allowing her to be able to walk and get around much more effectively. Following the injections she does well for a while and then when that seems to be wearing off she tends to start having issues with more swelling because she is more mobile that makes sense from a venous standpoint. 10/10; I was asked to see this patient today who came in for a nurse visit. Firstly she has developed a widespread very pruritic rash involving her abdomen and back. Lesser extent on her feet. The second problem is that she is complaining about the tightness of the wound and wrap on the left leg where the wound is. 09/14/2021 upon evaluation today patient appears to be doing decently well in regard to her leg ulcer. The biggest issue she is having is a significant issue with a rash currently. I do not see any signs of active infection systemically which is great news although with regard to the rash she did see dermatology they told her she had eczema that does not seem to sit right with me out think that is mainly the issues she has going on. I do think she may benefit from the use of a short course of prednisone which should help with the rash which is absolutely driving her crazy. 09/21/2021 upon evaluation today patient appears to be doing decently well in regard to her wound this is still pretty deep. The prednisone has helped with the rash although is not completely done and it is doing much better than what it was. Fortunately there is  no signs of active infection systemically at this time also great news. 09/28/2021 upon evaluation today patient's wound on her leg actually showing some signs of improvement which is good news I am happy  in that regard. Fortunately there does not appear to be any evidence of active infection systemically which is great news. Unfortunately she is still continuing to have issues with her rash which is almost worse than the actual wound itself for her at this point. 10/05/2021 upon evaluation today patient appears to be doing well with regard to her leg ulcer. She is tolerating the dressing changes without complication and overall I am extremely pleased with where we stand. With that being said I do think that she could possibly benefit from a snap VAC if we can gain approval I am not certain if this will be improved or not. Nonetheless I definitely think we can give this a try and if we get approval I think this will help speed up the healing process to be honest. In the interim I do believe the [facility] is doing decently well. 10/10/2021 upon evaluation today patient appears to be doing well with regard to her wound although is not significantly smaller. We still have quite a bit of depth here. Fortunately there is no signs of active infection systemically at this time. No fevers, chills, nausea, vomiting, or diarrhea. 10/17/2021 upon evaluation today patient appears to be doing poorly in regard to her leg compared to last time there is a lot of irritation today. She actually kept the Zetuvit on all week which does not seem to have been beneficial for her. She was supposed to change it at home we did order her supplies but she did not even find the supplies on her porch until she tells me yesterday. Either way we had already decided that the best thing was probably can to be to have her come in for nurse visits. I think that still probably the best thing to do I feel like there is a bit of dementia here that I was not aware of last week or even the previous visit to be honest that is going to prohibit her from being able to make these dressing changes herself. 10/27/2021 upon evaluation today  patient appears to be doing well with regard to her wound currently. She is actually showing signs of excellent improvement and I do feel like the collagen is doing a great job. I am extremely pleased with where we stand today. 11/03/2021 upon evaluation patient's wound bed actually showed signs of good granulation epithelization at this point. I am actually extremely pleased with where we stand and how this is progressing I think the patient is making great progress. She is very happy with today's findings. 12/27. This is a patient with wounds on her left lateral lower extremity. She has 2 open wounds. She did not tolerate 3 layer compression we have therefore been using Tubigrip. We use dermacol last week but we have endoform to use this week 12/04/2021 upon evaluation today patient appears to be doing well with regard to her wound. There is still some irritation around where she has a small area that she bumped inferior to where the original wound was. This is not appearing to be too significant at this point which is good news. 12/07/2020 upon evaluation today patient appears to be doing well with regard to her wounds actually look much better even than earlier this week when I saw her. They are measuring  smaller. Unfortunately she does appear to have some cellulitis of the leg in general. There is really nothing significant that I can culture here so I am just can have to treat prophylactically and see if we can get this under control. The patient voiced understanding and does realize what is going on as well as we discussed that today. Electronic Signature(s) Signed: 12/07/2021 8:32:07 AM By: Worthy Keeler PA-C Entered By: Worthy Keeler on 12/07/2021 08:32:07 [name], [name] ([id number]) [name], [name] ([id number]) -------------------------------------------------------------------------------- Physical Exam Details Patient Name: Slimp, Sameen L. Date of Service: 12/07/2021 8:00  AM Medical Record Number: [id number] Patient Account Number: [id number] Date of Birth/Sex: 1941/09/28 (81 y.o. F) Treating RN: Carlene Coria Primary Care Provider: [name] Other Clinician: Referring Provider: [name] Treating Provider/Extender: Skipper Cliche in Treatment: 87 Constitutional Well-nourished and well-hydrated in no acute distress. Respiratory normal breathing without difficulty. Psychiatric this patient is able to make decisions and demonstrates good insight into disease process. Alert and Oriented x 3. pleasant and cooperative. Notes Upon inspection patient's wounds again actually are showing signs of significant improvement I am actually very pleased with what we see in that regard. Nonetheless I do believe that she is doing somewhat poorly in regard to cellulitis. I see no evidence of a DVT here and in fact her redness and erythema seem to be in the distribution around where the wounds are circumferentially. Nonetheless I think that what we really need to do is try to get her on antibiotic to get the cellulitis under control unfortunately there is not really much I can do from a culture standpoint here. I am therefore can go with doxycycline since she is allergic to sulfur medications. Electronic Signature(s) Signed: 12/07/2021 8:33:03 AM By: Worthy Keeler PA-C Entered By: Worthy Keeler on 12/07/2021 08:33:02 [name], [name] ([id number]) -------------------------------------------------------------------------------- Physician Orders Details Patient Name: [name], [name] Date of Service: 12/07/2021 8:00 AM Medical Record Number: [id number] Patient Account Number: [id number] Date of Birth/Sex: July 07, 1941 (81 y.o. F) Treating RN: Carlene Coria Primary Care Provider: [name] Other Clinician: Referring Provider: [name] Treating Provider/Extender: Skipper Cliche in Treatment: 14 Verbal / Phone Orders: No Diagnosis Coding ICD-10  Coding Code Description I87.2 Venous insufficiency (chronic) (peripheral) L97.822 Non-pressure chronic ulcer of other part of left lower leg with fat layer exposed M79.7 Fibromyalgia M17.0 Bilateral primary osteoarthritis of knee B35.4 Tinea corporis R21 Rash and other nonspecific skin eruption Follow-up Appointments o Return Appointment in 1 week. - once a week provider o Nurse Visit as needed - once per week Bathing/ Shower/ Hygiene o May shower with wound dressing protected with water repellent cover or cast protector. o No tub bath. Anesthetic (Use 'Patient Medications' Section for Anesthetic Order Entry) o Lidocaine applied to wound bed Edema Control - Lymphedema / Segmental Compressive Device / Other o Tubigrip single layer applied. - E o Elevate, Exercise Daily and Avoid Standing for Long Periods of Time. o Elevate legs to the level of the heart and pump ankles as often as possible o Elevate leg(s) parallel to the floor when sitting. Wound Treatment Wound #1 - Lower Leg Wound Laterality: Left, Anterior Cleanser: Byram Ancillary Kit - 15 Day Supply (Generic) 2 x Per Week/30 Days Discharge Instructions: Use supplies as instructed; Kit contains: (15) Saline Bullets; (15) 3x3 Gauze; 15 pr Gloves Cleanser: Soap and Water 2 x Per Week/30 Days Discharge Instructions: Gently cleanse wound with antibacterial soap, rinse and pat dry prior to dressing wounds Topical:  Triamcinolone Acetonide Cream, 0.1%, 15 (g) tube 2 x Per Week/30 Days Discharge Instructions: Apply as directed by provider. Primary Dressing: Silvercel Small 2x2 (in/in) 2 x Per Week/30 Days Discharge Instructions: Apply Silvercel Small 2x2 (in/in) as instructed Secondary Dressing: Zetuvit Plus Silicone Non-bordered 5x5 (in/in) 2 x Per Week/30 Days Compression Wrap: Tubi E 2 x Per Week/30 Days Wound #2 - Lower Leg Wound Laterality: Left, Distal Cleanser: Byram Ancillary Kit - 15 Day Supply (Generic) 2 x  Per Week/30 Days Discharge Instructions: Use supplies as instructed; Kit contains: (15) Saline Bullets; (15) 3x3 Gauze; 15 pr Gloves Cleanser: Soap and Water 2 x Per Week/30 Days Discharge Instructions: Gently cleanse wound with antibacterial soap, rinse and pat dry prior to dressing wounds Topical: Triamcinolone Acetonide Cream, 0.1%, 15 (g) tube 2 x Per Week/30 Days Discharge Instructions: Apply as directed by provider. [name], [name] ([id number]) Primary Dressing: Silvercel Small 2x2 (in/in) 2 x Per Week/30 Days Discharge Instructions: Apply Silvercel Small 2x2 (in/in) as instructed Secondary Dressing: Zetuvit Plus Silicone Non-bordered 5x5 (in/in) 2 x Per Week/30 Days Compression Wrap: Tubi E 2 x Per Week/30 Days Patient Medications Allergies: sulfur, Macrodantin Notifications Medication Indication Start End doxycycline hyclate 12/07/2021 DOSE 1 - oral 100 mg capsule - 1 capsule oral taken 2 times per day for 14 days Electronic Signature(s) Signed: 12/08/2021 9:05:45 AM By: Worthy Keeler PA-C Signed: 12/08/2021 4:26:12 PM By: Carlene Coria RN Previous Signature: 12/07/2021 8:34:28 AM Version By: Worthy Keeler PA-C Entered By: Carlene Coria on 12/07/2021 08:36:39 [name], [name] ([id number]) -------------------------------------------------------------------------------- Problem List Details Patient Name: Kayes, Jamee L. Date of Service: 12/07/2021 8:00 AM Medical Record Number: [id number] Patient Account Number: [id number] Date of Birth/Sex: 1941/03/09 (81 y.o. F) Treating RN: Carlene Coria Primary Care Provider: [name] Other Clinician: Referring Provider: [name] Treating Provider/Extender: Skipper Cliche in Treatment: 14 Active Problems ICD-10 Encounter Code Description Active Date MDM Diagnosis I87.2 Venous insufficiency (chronic) (peripheral) 08/31/2021 No Yes L97.822 Non-pressure chronic ulcer of other part of left lower leg with fat layer  08/31/2021 No Yes exposed M79.7 Fibromyalgia 08/31/2021 No Yes M17.0 Bilateral primary osteoarthritis of knee 08/31/2021 No Yes B35.4 Tinea corporis 08/31/2021 No Yes R21 Rash and other nonspecific skin eruption 09/04/2021 No Yes Inactive Problems Resolved Problems Electronic Signature(s) Signed: 12/07/2021 8:20:17 AM By: Worthy Keeler PA-C Entered By: Worthy Keeler on 12/07/2021 08:20:17 [name], [name] ([id number]) -------------------------------------------------------------------------------- Progress Note Details Patient Name: [name], [name] Date of Service: 12/07/2021 8:00 AM Medical Record Number: [id number] Patient Account Number: [id number] Date of Birth/Sex: 16-Sep-1941 (81 y.o. F) Treating RN: Carlene Coria Primary Care Provider: [name] Other Clinician: Referring Provider: [name] Treating Provider/Extender: Skipper Cliche in Treatment: 14 Subjective Chief Complaint Information obtained from Patient Left LE Ulcer History of Present Illness (HPI) 08/31/2021 upon evaluation today patient presents for initial inspection here in our clinic concerning issues that she is having with a wound on her left anterior lower extremity. This is actually an area that she unfortunately tells me she was picking up a cage and dropped hitting the anterior portion of her shin causing a skin tear. She did not go to the hospital due to the fact that she did not realize that she would need to. This happened on July 30, 2021. With that being said the patient has been on Augmentin she has about 1 and half days of this left. Nonetheless she tells me that she does have a longstanding history of leg swelling. She  does have chronic venous insufficiency as well it appears to me. She also has some evidence of may be early stages of lymphedema. She has a history of fibromyalgia, bilateral knee osteoarthritis, and appears to have a fungal infection of the right ankle region. In  regard to the arthritis she tells me her legs tend to swell less after she gets her knee injections allowing her to be able to walk and get around much more effectively. Following the injections she does well for a while and then when that seems to be wearing off she tends to start having issues with more swelling because she is more mobile that makes sense from a venous standpoint. 10/10; I was asked to see this patient today who came in for a nurse visit. Firstly she has developed a widespread very pruritic rash involving her abdomen and back. Lesser extent on her feet. The second problem is that she is complaining about the tightness of the wound and wrap on the left leg where the wound is. 09/14/2021 upon evaluation today patient appears to be doing decently well in regard to her leg ulcer. The biggest issue she is having is a significant issue with a rash currently. I do not see any signs of active infection systemically which is great news although with regard to the rash she did see dermatology they told her she had eczema that does not seem to sit right with me out think that is mainly the issues she has going on. I do think she may benefit from the use of a short course of prednisone which should help with the rash which is absolutely driving her crazy. 09/21/2021 upon evaluation today patient appears to be doing decently well in regard to her wound this is still pretty deep. The prednisone has helped with the rash although is not completely done and it is doing much better than what it was. Fortunately there is no signs of active infection systemically at this time also great news. 09/28/2021 upon evaluation today patient's wound on her leg actually showing some signs of improvement which is good news I am happy in that regard. Fortunately there does not appear to be any evidence of active infection systemically which is great news. Unfortunately she is still continuing to have issues with  her rash which is almost worse than the actual wound itself for her at this point. 10/05/2021 upon evaluation today patient appears to be doing well with regard to her leg ulcer. She is tolerating the dressing changes without complication and overall I am extremely pleased with where we stand. With that being said I do think that she could possibly benefit from a snap VAC if we can gain approval I am not certain if this will be improved or not. Nonetheless I definitely think we can give this a try and if we get approval I think this will help speed up the healing process to be honest. In the interim I do believe the [facility] is doing decently well. 10/10/2021 upon evaluation today patient appears to be doing well with regard to her wound although is not significantly smaller. We still have quite a bit of depth here. Fortunately there is no signs of active infection systemically at this time. No fevers, chills, nausea, vomiting, or diarrhea. 10/17/2021 upon evaluation today patient appears to be doing poorly in regard to her leg compared to last time there is a lot of irritation today. She actually kept the Zetuvit on all week which  does not seem to have been beneficial for her. She was supposed to change it at home we did order her supplies but she did not even find the supplies on her porch until she tells me yesterday. Either way we had already decided that the best thing was probably can to be to have her come in for nurse visits. I think that still probably the best thing to do I feel like there is a bit of dementia here that I was not aware of last week or even the previous visit to be honest that is going to prohibit her from being able to make these dressing changes herself. 10/27/2021 upon evaluation today patient appears to be doing well with regard to her wound currently. She is actually showing signs of excellent improvement and I do feel like the collagen is doing a great job. I am  extremely pleased with where we stand today. 11/03/2021 upon evaluation patient's wound bed actually showed signs of good granulation epithelization at this point. I am actually extremely pleased with where we stand and how this is progressing I think the patient is making great progress. She is very happy with today's findings. 12/27. This is a patient with wounds on her left lateral lower extremity. She has 2 open wounds. She did not tolerate 3 layer compression we have therefore been using Tubigrip. We use dermacol last week but we have endoform to use this week 12/04/2021 upon evaluation today patient appears to be doing well with regard to her wound. There is still some irritation around where she has a small area that she bumped inferior to where the original wound was. This is not appearing to be too significant at this point which is good news. 12/07/2020 upon evaluation today patient appears to be doing well with regard to her wounds actually look much better even than earlier this week when I saw her. They are measuring smaller. Unfortunately she does appear to have some cellulitis of the leg in general. There is really nothing significant that I can culture here so I am just can have to treat prophylactically and see if we can get this under control. The patient voiced understanding and does realize what is going on as well as we discussed that today. [name], [name] ([id number]) Objective Constitutional Well-nourished and well-hydrated in no acute distress. Vitals Time Taken: 8:12 AM, Height: 63 in, Weight: 198 lbs, BMI: 35.1, Temperature: 98.7 F, Pulse: 86 bpm, Respiratory Rate: 18 breaths/min, Blood Pressure: 174/81 mmHg. Respiratory normal breathing without difficulty. Psychiatric this patient is able to make decisions and demonstrates good insight into disease process. Alert and Oriented x 3. pleasant and cooperative. General Notes: Upon inspection patient's wounds again  actually are showing signs of significant improvement I am actually very pleased with what we see in that regard. Nonetheless I do believe that she is doing somewhat poorly in regard to cellulitis. I see no evidence of a DVT here and in fact her redness and erythema seem to be in the distribution around where the wounds are circumferentially. Nonetheless I think that what we really need to do is try to get her on antibiotic to get the cellulitis under control unfortunately there is not really much I can do from a culture standpoint here. I am therefore can go with doxycycline since she is allergic to sulfur medications. Integumentary (Hair, Skin) Wound #1 status is Open. Original cause of wound was Skin Tear/Laceration. The date acquired was: 07/30/2021. The wound has  been in treatment 14 weeks. The wound is located on the Left,Anterior Lower Leg. The wound measures 1cm length x 0.5cm width x 0.4cm depth; 0.393cm^2 area and 0.157cm^3 volume. There is Fat Layer (Subcutaneous Tissue) exposed. There is no tunneling or undermining noted. There is a medium amount of serosanguineous drainage noted. The wound margin is flat and intact. There is small (1-33%) red granulation within the wound bed. There is a medium (34-66%) amount of necrotic tissue within the wound bed including Adherent Slough. Wound #2 status is Open. Original cause of wound was Trauma. The date acquired was: 12/04/2021. The wound is located on the Left,Distal Lower Leg. The wound measures 1cm length x 0.5cm width x 0.1cm depth; 0.393cm^2 area and 0.039cm^3 volume. There is Fat Layer (Subcutaneous Tissue) exposed. There is no tunneling or undermining noted. There is a medium amount of serosanguineous drainage noted. There is large (67- 100%) red granulation within the wound bed. There is a small (1-33%) amount of necrotic tissue within the wound bed including Adherent Slough. Assessment Active Problems ICD-10 Venous insufficiency (chronic)  (peripheral) Non-pressure chronic ulcer of other part of left lower leg with fat layer exposed Fibromyalgia Bilateral primary osteoarthritis of knee Tinea corporis Rash and other nonspecific skin eruption Plan Follow-up Appointments: Return Appointment in 1 week. - once a week provider Nurse Visit as needed - once per week Bathing/ Shower/ Hygiene: May shower with wound dressing protected with water repellent cover or cast protector. No tub bath. Anesthetic (Use 'Patient Medications' Section for Anesthetic Order Entry): Lidocaine applied to wound bed Edema Control - Lymphedema / Segmental Compressive Device / Other: Tubigrip single layer applied. - E Elevate, Exercise Daily and Avoid Standing for Long Periods of Time. Elevate legs to the level of the heart and pump ankles as often as possible [name], [name] ([id number]) Elevate leg(s) parallel to the floor when sitting. The following medication(s) was prescribed: doxycycline hyclate oral 100 mg capsule 1 1 capsule oral taken 2 times per day for 14 days starting 12/07/2021 WOUND #1: - Lower Leg Wound Laterality: Left, Anterior Cleanser: Byram Ancillary Kit - 15 Day Supply (Generic) 2 x Per Week/30 Days Discharge Instructions: Use supplies as instructed; Kit contains: (15) Saline Bullets; (15) 3x3 Gauze; 15 pr Gloves Cleanser: Soap and Water 2 x Per Week/30 Days Discharge Instructions: Gently cleanse wound with antibacterial soap, rinse and pat dry prior to dressing wounds Topical: Triamcinolone Acetonide Cream, 0.1%, 15 (g) tube 2 x Per Week/30 Days Discharge Instructions: Apply as directed by provider. Primary Dressing: Silvercel Small 2x2 (in/in) 2 x Per Week/30 Days Discharge Instructions: Apply Silvercel Small 2x2 (in/in) as instructed Secondary Dressing: Gauze 2 x Per Week/30 Days Discharge Instructions: As directed: dry, moistened with saline or moistened with Dakins Solution Secondary Dressing: Kerlix 4.5 x 4.1 (in/yd) 2 x  Per Week/30 Days Discharge Instructions: Apply Kerlix 4.5 x 4.1 (in/yd) as instructed Secured With: 101M Medipore H Soft Cloth Surgical Tape, 2x2 (in/yd) 2 x Per Week/30 Days Compression Wrap: Tubi E 2 x Per Week/30 Days WOUND #2: - Lower Leg Wound Laterality: Left, Distal Cleanser: Byram Ancillary Kit - 15 Day Supply (Generic) 2 x Per Week/30 Days Discharge Instructions: Use supplies as instructed; Kit contains: (15) Saline Bullets; (15) 3x3 Gauze; 15 pr Gloves Cleanser: Soap and Water 2 x Per Week/30 Days Discharge Instructions: Gently cleanse wound with antibacterial soap, rinse and pat dry prior to dressing wounds Topical: Triamcinolone Acetonide Cream, 0.1%, 15 (g) tube 2 x Per Week/30 Days Discharge  Instructions: Apply as directed by provider. Primary Dressing: Silvercel Small 2x2 (in/in) 2 x Per Week/30 Days Discharge Instructions: Apply Silvercel Small 2x2 (in/in) as instructed Secondary Dressing: Gauze 2 x Per Week/30 Days Discharge Instructions: As directed: dry, moistened with saline or moistened with Dakins Solution Secondary Dressing: Kerlix 4.5 x 4.1 (in/yd) 2 x Per Week/30 Days Discharge Instructions: Apply Kerlix 4.5 x 4.1 (in/yd) as instructed Secured With: 74M Medipore H Soft Cloth Surgical Tape, 2x2 (in/yd) 2 x Per Week/30 Days Compression Wrap: Tubi E 2 x Per Week/30 Days 1. Would recommend currently that we go ahead and send in a prescription for doxycycline for the patient she is in agreement with the plan. 2. I am also can recommend that we have the patient go ahead and continue with the dressing changes as before. I think that that is doing a good job and I see no issues in that regard. The biggest thing I think is just getting the infection under control. I know this is giving her a lot of pain but as we get the infection moving a better direction she should be doing much better in general. We will see patient back for reevaluation in 1 week here in the clinic. If  anything worsens or changes patient will contact our office for additional recommendations. Electronic Signature(s) Signed: 12/07/2021 8:34:58 AM By: Worthy Keeler PA-C Entered By: Worthy Keeler on 12/07/2021 08:34:58 [name], [name] ([id number]) -------------------------------------------------------------------------------- SuperBill Details Patient Name: Krajewski, Dorethea L. Date of Service: 12/07/2021 Medical Record Number: [id number] Patient Account Number: [id number] Date of Birth/Sex: 28-Apr-1941 (81 y.o. 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 B35.4 Tinea corporis R21 Rash and other nonspecific skin eruption Facility Procedures CPT4 Code: [id number] Description: 99213 - WOUND CARE VISIT-LEV 3 EST PT Modifier: Quantity: 1 Physician Procedures CPT4 Code: [id number] Description: 28206 - WC PHYS LEVEL 4 - EST PT Modifier: Quantity: 1 CPT4 Code: Description: ICD-10 Diagnosis Description 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

## 2021-12-08 ENCOUNTER — Ambulatory Visit: Payer: PPO

## 2021-12-08 NOTE — Progress Notes (Signed)
Wound Assessment Details Patient Name: [name]. Date of Service: 12/07/2021 8:00 AM Medical Record Number: [id number] Patient Account Number: [id number] Date of Birth/Sex: Oct 06, 1941 (81 y.o. F) Treating RN: Carlene Coria Primary Care Kristinia Leavy: [name] Other Clinician: Referring Lashandra Arauz: [name] Treating Chee Dimon/Extender: Skipper Cliche in Treatment: 14 Wound  Status Wound Number: 1 Primary Etiology: Venous Leg Ulcer Wound Location: Left, Anterior Lower Leg Wound Status: Open Wounding Event: Skin Tear/Laceration Comorbid History: Cataracts, Osteoarthritis, Received Radiation Date Acquired: 07/30/2021 Weeks Of Treatment: 14 Clustered Wound: No Photos Wound Measurements Length: (cm) 1 Width: (cm) 0.5 Depth: (cm) 0.4 Area: (cm) 0.393 Volume: (cm) 0.157 % Reduction in Area: 68.7% % Reduction in Volume: 75% Epithelialization: Small (1-33%) Tunneling: No Undermining: No Wound Description Classification: Full Thickness Without Exposed Support Structures Wound Margin: Flat and Intact Exudate Amount: Medium Exudate Type: Serosanguineous Exudate Color: red, brown Foul Odor After Cleansing: No Slough/Fibrino Yes Wound Bed Granulation Amount: Small (1-33%) Exposed Structure Granulation Quality: Red Fascia Exposed: No Necrotic Amount: Medium (34-66%) Fat Layer (Subcutaneous Tissue) Exposed: Yes Necrotic Quality: Adherent Slough Tendon Exposed: No Muscle Exposed: No Joint Exposed: No Bone Exposed: No Treatment Notes Wound #1 (Lower Leg) Wound Laterality: Left, Anterior Cleanser Byram Ancillary Kit - 15 Day Supply Discharge Instruction: Use supplies as instructed; Kit contains: (15) Saline Bullets; (15) 3x3 Gauze; 15 pr Gloves Soap and Water Kepner, Marigene [name]. ([id number]) Discharge Instruction: Gently cleanse wound with antibacterial soap, rinse and pat dry prior to dressing wounds Peri-Wound Care Topical Triamcinolone Acetonide Cream, 0.1%, 15 (g) tube Discharge Instruction: Apply as directed by Felis Quillin. Primary Dressing Silvercel Small 2x2 (in/in) Discharge Instruction: Apply Silvercel Small 2x2 (in/in) as instructed Secondary Dressing Zetuvit Plus Silicone Non-bordered 5x5 (in/in) Secured With Compression Wrap Tubi E Compression Stockings Add-Ons Electronic Signature(s) Signed: 12/08/2021 4:26:12 PM By: Carlene Coria  RN Entered By: Carlene Coria on 12/07/2021 08:16:33 Blackley, [name] ([id number]) -------------------------------------------------------------------------------- Wound Assessment Details Patient Name: [name]. Date of Service: 12/07/2021 8:00 AM Medical Record Number: [id number] Patient Account Number: [id number] Date of Birth/Sex: 1941/02/20 (81 y.o. F) Treating RN: Carlene Coria Primary Care Lasheena Frieze: [name] Other Clinician: Referring Jadae Steinke: [name] Treating Glenn Christo/Extender: Skipper Cliche in Treatment: 14 Wound Status Wound Number: 2 Primary Etiology: Venous Leg Ulcer Wound Location: Left, Distal Lower Leg Wound Status: Open Wounding Event: Trauma Comorbid History: Cataracts, Osteoarthritis, Received Radiation Date Acquired: 12/04/2021 Weeks Of Treatment: 0 Clustered Wound: No Photos Wound Measurements Length: (cm) 1 Width: (cm) 0.5 Depth: (cm) 0.1 Area: (cm) 0.393 Volume: (cm) 0.039 % Reduction in Area: % Reduction in Volume: Epithelialization: None Tunneling: No Undermining: No Wound Description Classification: Full Thickness Without Exposed Support Structures Exudate Amount: Medium Exudate Type: Serosanguineous Exudate Color: red, brown Foul Odor After Cleansing: No Slough/Fibrino Yes Wound Bed Granulation Amount: Large (67-100%) Exposed Structure Granulation Quality: Red Fascia Exposed: No Necrotic Amount: Small (1-33%) Fat Layer (Subcutaneous Tissue) Exposed: Yes Necrotic Quality: Adherent Slough Tendon Exposed: No Muscle Exposed: No Joint Exposed: No Bone Exposed: No Treatment Notes Wound #2 (Lower Leg) Wound Laterality: Left, Distal Cleanser Byram Ancillary Kit - 15 Day Supply Discharge Instruction: Use supplies as instructed; Kit contains: (15) Saline Bullets; (15) 3x3 Gauze; 15 pr Gloves Soap and Water Discharge Instruction: Gently cleanse wound with antibacterial soap, rinse and pat dry prior to dressing  wounds Dy, [name]. ([id number]) Peri-Wound Care Topical Triamcinolone Acetonide Cream, 0.1%, 15 (g) tube Discharge Instruction: Apply as directed by Doyce Saling. Primary Dressing Silvercel Small 2x2 (in/in) Discharge Instruction:  x W x D (cm) 1x0.5x0.4 1x0.5x0.1 N/A Area (cm) : 0.393 0.393 N/A Volume (cm) : 0.157 0.039 N/A % Reduction in Area: 68.70% N/A N/A % Reduction in Volume: 75.00% N/A N/A Classification: Full Thickness Without Exposed Full Thickness Without Exposed N/A Support Structures Support Structures Exudate Amount: Medium Medium N/A Exudate Type: Serosanguineous Serosanguineous N/A Exudate Color: red, brown red, brown N/A Wound Margin: Flat and Intact N/A N/A Granulation Amount: Small (1-33%) Large (67-100%) N/A Granulation Quality: Red Red N/A Necrotic Amount: Medium (34-66%) Small (1-33%) N/A Exposed Structures: Fat Layer (Subcutaneous Tissue): Fat Layer (Subcutaneous Tissue): N/A Yes Yes Fascia: No Fascia: No Tendon: No Tendon: No Muscle: No Muscle: No Joint: No Joint: No Bone: No Bone: No Epithelialization: Small (1-33%) None N/A Treatment Notes Electronic Signature(s) Signed: 12/08/2021 4:26:12 PM By: Carlene Coria RN Entered By: Carlene Coria on 12/07/2021 08:25:28 Yarbrough, [name] ([id number]) -------------------------------------------------------------------------------- [facility] Details Patient Name: [name]. Date of Service: 12/07/2021 8:00 AM Medical Record Number: [id number] Patient Account Number:  [id number] Date of Birth/Sex: [birth year] (81 y.o. F) Treating RN: Carlene Coria Primary Care Kerrilynn Derenzo: [name] Other Clinician: Referring Chenell Lozon: [name] Treating Jonaya Freshour/Extender: Skipper Cliche in Treatment: 14 Active Inactive Wound/Skin Impairment Nursing Diagnoses: Impaired tissue integrity Goals: Patient/caregiver will verbalize understanding of skin care regimen Date Initiated: 08/31/2021 Date Inactivated: 09/21/2021 Target Resolution Date: 08/31/2021 Goal Status: Met Ulcer/skin breakdown will have a volume reduction of 30% by week 4 Date Initiated: 08/31/2021 Date Inactivated: 10/17/2021 Target Resolution Date: 10/01/2021 Goal Status: Unmet Unmet Reason: comorbities Ulcer/skin breakdown will have a volume reduction of 50% by week 8 Date Initiated: 08/31/2021 Date Inactivated: 12/04/2021 Target Resolution Date: 10/31/2021 Goal Status: Unmet Unmet Reason: comorbities Ulcer/skin breakdown will have a volume reduction of 80% by week 12 Date Initiated: 08/31/2021 Date Inactivated: 12/04/2021 Target Resolution Date: 12/01/2021 Goal Status: Unmet Unmet Reason: comorbities Ulcer/skin breakdown will heal within 14 weeks Date Initiated: 08/31/2021 Target Resolution Date: 01/01/2022 Goal Status: Active Interventions: Assess patient/caregiver ability to obtain necessary supplies Assess patient/caregiver ability to perform ulcer/skin care regimen upon admission and as needed Assess ulceration(s) every visit Provide education on ulcer and skin care Treatment Activities: Referred to DME Joachim Carton for dressing supplies : 08/31/2021 Skin care regimen initiated : 08/31/2021 Notes: Electronic Signature(s) Signed: 12/08/2021 4:26:12 PM By: Carlene Coria RN Entered By: Carlene Coria on 12/07/2021 08:25:13 Mastropietro, [name] ([id number]) -------------------------------------------------------------------------------- Pain Assessment Details Patient Name: Sugg, Dell [name]. Date  of Service: 12/07/2021 8:00 AM Medical Record Number: [id number] Patient Account Number: [id number] Date of Birth/Sex: 1941-10-02 (80 y.o. F) Treating RN: Carlene Coria Primary Care Arayna Illescas: [name] Other Clinician: Referring Perpetua Elling: [name] Treating Kamalei Roeder/Extender: Skipper Cliche in Treatment: 14 Active Problems Location of Pain Severity and Description of Pain Patient Has Paino No Site Locations Pain Management and Medication Current Pain Management: Electronic Signature(s) Signed: 12/08/2021 4:26:12 PM By: Carlene Coria RN Entered By: Carlene Coria on 12/07/2021 08:13:10 Zane, [name] ([id number]) -------------------------------------------------------------------------------- Patient/Caregiver Education Details Patient Name: Fenderson, Jonesha [name]. Date of Service: 12/07/2021 8:00 AM Medical Record Number: [id number] Patient Account Number: [id number] Date of Birth/Gender: March 21, 1941 (80 y.o. F) Treating RN: Carlene Coria Primary Care Physician: [name] Other Clinician: Referring Physician: [name] Treating Physician/Extender: Skipper Cliche in Treatment: 14 Education Assessment Education Provided To: Patient Education Topics Provided Wound/Skin Impairment: Methods: Explain/Verbal Responses: State content correctly Electronic Signature(s) Signed: 12/08/2021 4:26:12 PM By: Carlene Coria RN Entered By: Carlene Coria on 12/07/2021 08:35:06 Meuser, Maghen [name]. ([id number]) --------------------------------------------------------------------------------  SPARKLES, MCNEELY ([id number]) Visit Report for 12/07/2021 Arrival Information Details Patient Name: [name], [name]. Date of Service: 12/07/2021 8:00 AM Medical Record Number: [id number] Patient Account Number: [id number] Date of Birth/Sex: 12-28-1940 (80 y.o. F) Treating RN: Carlene Coria Primary Care Ariya Bohannon: [name] Other Clinician: Referring Japhet Morgenthaler: [name] Treating Jeanluc Wegman/Extender: Skipper Cliche in Treatment: 57 Visit Information History Since Last Visit All ordered tests and consults were completed: No Patient Arrived: [name] Added or deleted any medications: No Arrival Time: 08:12 Any new allergies or adverse reactions: No Accompanied By: self Had a fall or experienced change in No Transfer Assistance: None activities of daily living that may affect Patient Identification Verified: Yes risk of falls: Secondary Verification Process Completed: Yes Signs or symptoms of abuse/neglect since last visito No Patient Requires Transmission-Based Precautions: No Hospitalized since last visit: No Patient Has Alerts: No Implantable device outside of the clinic excluding No cellular tissue based products placed in the center since last visit: Has Dressing in Place as Prescribed: Yes Pain Present Now: Yes Electronic Signature(s) Signed: 12/08/2021 4:26:12 PM By: Carlene Coria RN Entered By: Carlene Coria on 12/07/2021 08:12:36 Pierrelouis, [name] ([id number]) -------------------------------------------------------------------------------- Clinic Level of Care Assessment Details Patient Name: Hevia, Katarina [name]. Date of Service: 12/07/2021 8:00 AM Medical Record Number: [id number] Patient Account Number: [id number] Date of Birth/Sex: 08/06/1941 (80 y.o. F) Treating RN: Carlene Coria Primary Care Sharanya Templin: [name] Other Clinician: Referring Chantille Navarrete: [name] Treating Griff Badley/Extender: Skipper Cliche in Treatment: 14 Clinic Level of Care  Assessment Items TOOL 4 Quantity Score X - Use when only an EandM is performed on FOLLOW-UP visit 1 0 ASSESSMENTS - Nursing Assessment / Reassessment X - Reassessment of Co-morbidities (includes updates in patient status) 1 10 X- 1 5 Reassessment of Adherence to Treatment Plan ASSESSMENTS - Wound and Skin Assessment / Reassessment _0  - Simple Wound Assessment / Reassessment - one wound 0 X- 2 5 Complex Wound Assessment / Reassessment - multiple wounds _1  - 0 Dermatologic / Skin Assessment (not related to wound area) ASSESSMENTS - Focused Assessment _2  - Circumferential Edema Measurements - multi extremities 0 _3  - 0 Nutritional Assessment / Counseling / Intervention _4  - 0 Lower Extremity Assessment (monofilament, tuning fork, pulses) _5  - 0 Peripheral Arterial Disease Assessment (using hand held doppler) ASSESSMENTS - Ostomy and/or Continence Assessment and Care _6  - Incontinence Assessment and Management 0 _7  - 0 Ostomy Care Assessment and Management (repouching, etc.) PROCESS - Coordination of Care X - Simple Patient / Family Education for ongoing care 1 15 _8  - 0 Complex (extensive) Patient / Family Education for ongoing care _9  - 0 Staff obtains Programmer, systems, Records, Test Results / Process Orders _10  - 0 Staff telephones HHA, Nursing Homes / Clarify orders / etc _11  - 0 Routine Transfer to another Facility (non-emergent condition) _12  - 0 Routine Hospital Admission (non-emergent condition) _13  - 0 New Admissions / Biomedical engineer / Ordering NPWT, Apligraf, etc. _14  - 0 Emergency Hospital Admission (emergent condition) X- 1 10 Simple Discharge Coordination _15  - 0 Complex (extensive) Discharge Coordination PROCESS - Special Needs _16  - Pediatric / Minor Patient Management 0 _17  - 0 Isolation Patient Management _18  - 0 Hearing / Language / Visual special needs _19  - 0 Assessment of Community assistance (transportation, D/C planning, etc.) _20  - 0 Additional  assistance / Altered mentation _21  - 0 Support Surface(s) Assessment (bed, cushion, seat, etc.) INTERVENTIONS - Wound Cleansing / Measurement Shedden, Etheline [name]. ([id number]) _22  - 0 Simple Wound Cleansing - one  SPARKLES, MCNEELY ([id number]) Visit Report for 12/07/2021 Arrival Information Details Patient Name: [name], [name]. Date of Service: 12/07/2021 8:00 AM Medical Record Number: [id number] Patient Account Number: [id number] Date of Birth/Sex: 12-28-1940 (80 y.o. F) Treating RN: Carlene Coria Primary Care Ariya Bohannon: [name] Other Clinician: Referring Japhet Morgenthaler: [name] Treating Jeanluc Wegman/Extender: Skipper Cliche in Treatment: 57 Visit Information History Since Last Visit All ordered tests and consults were completed: No Patient Arrived: [name] Added or deleted any medications: No Arrival Time: 08:12 Any new allergies or adverse reactions: No Accompanied By: self Had a fall or experienced change in No Transfer Assistance: None activities of daily living that may affect Patient Identification Verified: Yes risk of falls: Secondary Verification Process Completed: Yes Signs or symptoms of abuse/neglect since last visito No Patient Requires Transmission-Based Precautions: No Hospitalized since last visit: No Patient Has Alerts: No Implantable device outside of the clinic excluding No cellular tissue based products placed in the center since last visit: Has Dressing in Place as Prescribed: Yes Pain Present Now: Yes Electronic Signature(s) Signed: 12/08/2021 4:26:12 PM By: Carlene Coria RN Entered By: Carlene Coria on 12/07/2021 08:12:36 Pierrelouis, [name] ([id number]) -------------------------------------------------------------------------------- Clinic Level of Care Assessment Details Patient Name: Hevia, Katarina [name]. Date of Service: 12/07/2021 8:00 AM Medical Record Number: [id number] Patient Account Number: [id number] Date of Birth/Sex: 08/06/1941 (80 y.o. F) Treating RN: Carlene Coria Primary Care Sharanya Templin: [name] Other Clinician: Referring Chantille Navarrete: [name] Treating Griff Badley/Extender: Skipper Cliche in Treatment: 14 Clinic Level of Care  Assessment Items TOOL 4 Quantity Score X - Use when only an EandM is performed on FOLLOW-UP visit 1 0 ASSESSMENTS - Nursing Assessment / Reassessment X - Reassessment of Co-morbidities (includes updates in patient status) 1 10 X- 1 5 Reassessment of Adherence to Treatment Plan ASSESSMENTS - Wound and Skin Assessment / Reassessment _0  - Simple Wound Assessment / Reassessment - one wound 0 X- 2 5 Complex Wound Assessment / Reassessment - multiple wounds _1  - 0 Dermatologic / Skin Assessment (not related to wound area) ASSESSMENTS - Focused Assessment _2  - Circumferential Edema Measurements - multi extremities 0 _3  - 0 Nutritional Assessment / Counseling / Intervention _4  - 0 Lower Extremity Assessment (monofilament, tuning fork, pulses) _5  - 0 Peripheral Arterial Disease Assessment (using hand held doppler) ASSESSMENTS - Ostomy and/or Continence Assessment and Care _6  - Incontinence Assessment and Management 0 _7  - 0 Ostomy Care Assessment and Management (repouching, etc.) PROCESS - Coordination of Care X - Simple Patient / Family Education for ongoing care 1 15 _8  - 0 Complex (extensive) Patient / Family Education for ongoing care _9  - 0 Staff obtains Programmer, systems, Records, Test Results / Process Orders _10  - 0 Staff telephones HHA, Nursing Homes / Clarify orders / etc _11  - 0 Routine Transfer to another Facility (non-emergent condition) _12  - 0 Routine Hospital Admission (non-emergent condition) _13  - 0 New Admissions / Biomedical engineer / Ordering NPWT, Apligraf, etc. _14  - 0 Emergency Hospital Admission (emergent condition) X- 1 10 Simple Discharge Coordination _15  - 0 Complex (extensive) Discharge Coordination PROCESS - Special Needs _16  - Pediatric / Minor Patient Management 0 _17  - 0 Isolation Patient Management _18  - 0 Hearing / Language / Visual special needs _19  - 0 Assessment of Community assistance (transportation, D/C planning, etc.) _20  - 0 Additional  assistance / Altered mentation _21  - 0 Support Surface(s) Assessment (bed, cushion, seat, etc.) INTERVENTIONS - Wound Cleansing / Measurement Shedden, Etheline [name]. ([id number]) _22  - 0 Simple Wound Cleansing - one  Wound Assessment Details Patient Name: [name]. Date of Service: 12/07/2021 8:00 AM Medical Record Number: [id number] Patient Account Number: [id number] Date of Birth/Sex: Oct 06, 1941 (81 y.o. F) Treating RN: Carlene Coria Primary Care Kristinia Leavy: [name] Other Clinician: Referring Lashandra Arauz: [name] Treating Chee Dimon/Extender: Skipper Cliche in Treatment: 14 Wound  Status Wound Number: 1 Primary Etiology: Venous Leg Ulcer Wound Location: Left, Anterior Lower Leg Wound Status: Open Wounding Event: Skin Tear/Laceration Comorbid History: Cataracts, Osteoarthritis, Received Radiation Date Acquired: 07/30/2021 Weeks Of Treatment: 14 Clustered Wound: No Photos Wound Measurements Length: (cm) 1 Width: (cm) 0.5 Depth: (cm) 0.4 Area: (cm) 0.393 Volume: (cm) 0.157 % Reduction in Area: 68.7% % Reduction in Volume: 75% Epithelialization: Small (1-33%) Tunneling: No Undermining: No Wound Description Classification: Full Thickness Without Exposed Support Structures Wound Margin: Flat and Intact Exudate Amount: Medium Exudate Type: Serosanguineous Exudate Color: red, brown Foul Odor After Cleansing: No Slough/Fibrino Yes Wound Bed Granulation Amount: Small (1-33%) Exposed Structure Granulation Quality: Red Fascia Exposed: No Necrotic Amount: Medium (34-66%) Fat Layer (Subcutaneous Tissue) Exposed: Yes Necrotic Quality: Adherent Slough Tendon Exposed: No Muscle Exposed: No Joint Exposed: No Bone Exposed: No Treatment Notes Wound #1 (Lower Leg) Wound Laterality: Left, Anterior Cleanser Byram Ancillary Kit - 15 Day Supply Discharge Instruction: Use supplies as instructed; Kit contains: (15) Saline Bullets; (15) 3x3 Gauze; 15 pr Gloves Soap and Water Kepner, Marigene [name]. ([id number]) Discharge Instruction: Gently cleanse wound with antibacterial soap, rinse and pat dry prior to dressing wounds Peri-Wound Care Topical Triamcinolone Acetonide Cream, 0.1%, 15 (g) tube Discharge Instruction: Apply as directed by Felis Quillin. Primary Dressing Silvercel Small 2x2 (in/in) Discharge Instruction: Apply Silvercel Small 2x2 (in/in) as instructed Secondary Dressing Zetuvit Plus Silicone Non-bordered 5x5 (in/in) Secured With Compression Wrap Tubi E Compression Stockings Add-Ons Electronic Signature(s) Signed: 12/08/2021 4:26:12 PM By: Carlene Coria  RN Entered By: Carlene Coria on 12/07/2021 08:16:33 Blackley, [name] ([id number]) -------------------------------------------------------------------------------- Wound Assessment Details Patient Name: [name]. Date of Service: 12/07/2021 8:00 AM Medical Record Number: [id number] Patient Account Number: [id number] Date of Birth/Sex: 1941/02/20 (81 y.o. F) Treating RN: Carlene Coria Primary Care Lasheena Frieze: [name] Other Clinician: Referring Jadae Steinke: [name] Treating Glenn Christo/Extender: Skipper Cliche in Treatment: 14 Wound Status Wound Number: 2 Primary Etiology: Venous Leg Ulcer Wound Location: Left, Distal Lower Leg Wound Status: Open Wounding Event: Trauma Comorbid History: Cataracts, Osteoarthritis, Received Radiation Date Acquired: 12/04/2021 Weeks Of Treatment: 0 Clustered Wound: No Photos Wound Measurements Length: (cm) 1 Width: (cm) 0.5 Depth: (cm) 0.1 Area: (cm) 0.393 Volume: (cm) 0.039 % Reduction in Area: % Reduction in Volume: Epithelialization: None Tunneling: No Undermining: No Wound Description Classification: Full Thickness Without Exposed Support Structures Exudate Amount: Medium Exudate Type: Serosanguineous Exudate Color: red, brown Foul Odor After Cleansing: No Slough/Fibrino Yes Wound Bed Granulation Amount: Large (67-100%) Exposed Structure Granulation Quality: Red Fascia Exposed: No Necrotic Amount: Small (1-33%) Fat Layer (Subcutaneous Tissue) Exposed: Yes Necrotic Quality: Adherent Slough Tendon Exposed: No Muscle Exposed: No Joint Exposed: No Bone Exposed: No Treatment Notes Wound #2 (Lower Leg) Wound Laterality: Left, Distal Cleanser Byram Ancillary Kit - 15 Day Supply Discharge Instruction: Use supplies as instructed; Kit contains: (15) Saline Bullets; (15) 3x3 Gauze; 15 pr Gloves Soap and Water Discharge Instruction: Gently cleanse wound with antibacterial soap, rinse and pat dry prior to dressing  wounds Dy, [name]. ([id number]) Peri-Wound Care Topical Triamcinolone Acetonide Cream, 0.1%, 15 (g) tube Discharge Instruction: Apply as directed by Doyce Saling. Primary Dressing Silvercel Small 2x2 (in/in) Discharge Instruction:

## 2021-12-12 ENCOUNTER — Other Ambulatory Visit: Payer: Self-pay

## 2021-12-12 DIAGNOSIS — I872 Venous insufficiency (chronic) (peripheral): Secondary | ICD-10-CM | POA: Diagnosis not present

## 2021-12-12 NOTE — Progress Notes (Signed)
[name], [name] ([id number]) Visit Report for 12/12/2021 Arrival Information Details Patient Name: [name], [name]. Date of Service: 12/12/2021 2:00 PM Medical Record Number: [id number] Patient Account Number: [id number] Date of Birth/Sex: 11-29-1940 (81 y.o. F) Treating RN: Carlene Coria Primary Care Aila Terra: [name] Other Clinician: Referring Izabella Marcantel: [name] Treating Anisia Leija/Extender: Skipper Cliche in Treatment: 82 Visit Information History Since Last Visit All ordered tests and consults were completed: No Patient Arrived: [name] Added or deleted any medications: No Arrival Time: 14:01 Any new allergies or adverse reactions: No Accompanied By: self Had a fall or experienced change in No Transfer Assistance: None activities of daily living that may affect Patient Identification Verified: Yes risk of falls: Secondary Verification Process Completed: Yes Signs or symptoms of abuse/neglect since last visito No Patient Requires Transmission-Based Precautions: No Hospitalized since last visit: No Patient Has Alerts: No Implantable device outside of the clinic excluding No cellular tissue based products placed in the center since last visit: Has Dressing in Place as Prescribed: Yes Pain Present Now: No Electronic Signature(s) Signed: 12/12/2021 3:42:59 PM By: Carlene Coria RN Entered By: Carlene Coria on 12/12/2021 14:24:47 Oxley, [name] ([id number]) -------------------------------------------------------------------------------- Clinic Level of Care Assessment Details Patient Name: [name], [name] Date of Service: 12/12/2021 2:00 PM Medical Record Number: [id number] Patient Account Number: [id number] Date of Birth/Sex: Nov 01, 1941 (81 y.o. F) Treating RN: Carlene Coria Primary Care Dell Briner: [name] Other Clinician: Referring Aneudy Champlain: [name] Treating Dene Landsberg/Extender: Skipper Cliche in Treatment: 14 Clinic Level of Care  Assessment Items TOOL 4 Quantity Score X - Use when only an EandM is performed on FOLLOW-UP visit 1 0 ASSESSMENTS - Nursing Assessment / Reassessment X - Reassessment of Co-morbidities (includes updates in patient status) 1 10 X- 1 5 Reassessment of Adherence to Treatment Plan ASSESSMENTS - Wound and Skin Assessment / Reassessment X - Simple Wound Assessment / Reassessment - one wound 1 5 '[]'  - 0 Complex Wound Assessment / Reassessment - multiple wounds '[]'  - 0 Dermatologic / Skin Assessment (not related to wound area) ASSESSMENTS - Focused Assessment '[]'  - Circumferential Edema Measurements - multi extremities 0 '[]'  - 0 Nutritional Assessment / Counseling / Intervention '[]'  - 0 Lower Extremity Assessment (monofilament, tuning fork, pulses) '[]'  - 0 Peripheral Arterial Disease Assessment (using hand held doppler) ASSESSMENTS - Ostomy and/or Continence Assessment and Care '[]'  - Incontinence Assessment and Management 0 '[]'  - 0 Ostomy Care Assessment and Management (repouching, etc.) PROCESS - Coordination of Care X - Simple Patient / Family Education for ongoing care 1 15 '[]'  - 0 Complex (extensive) Patient / Family Education for ongoing care '[]'  - 0 Staff obtains Programmer, systems, Records, Test Results / Process Orders '[]'  - 0 Staff telephones HHA, Nursing Homes / Clarify orders / etc '[]'  - 0 Routine Transfer to another Facility (non-emergent condition) '[]'  - 0 Routine Hospital Admission (non-emergent condition) '[]'  - 0 New Admissions / Biomedical engineer / Ordering NPWT, Apligraf, etc. '[]'  - 0 Emergency Hospital Admission (emergent condition) X- 1 10 Simple Discharge Coordination '[]'  - 0 Complex (extensive) Discharge Coordination PROCESS - Special Needs '[]'  - Pediatric / Minor Patient Management 0 '[]'  - 0 Isolation Patient Management '[]'  - 0 Hearing / Language / Visual special needs '[]'  - 0 Assessment of Community assistance (transportation, D/C planning, etc.) '[]'  - 0 Additional  assistance / Altered mentation '[]'  - 0 Support Surface(s) Assessment (bed, cushion, seat, etc.) INTERVENTIONS - Wound Cleansing / Measurement [name], [name] ([id number]) X- 1 5 Simple Wound Cleansing -  one wound '[]'  - 0 Complex Wound Cleansing - multiple wounds '[]'  - 0 Wound Imaging (photographs - any number of wounds) '[]'  - 0 Wound Tracing (instead of photographs) X- 1 5 Simple Wound Measurement - one wound '[]'  - 0 Complex Wound Measurement - multiple wounds INTERVENTIONS - Wound Dressings X - Small Wound Dressing one or multiple wounds 1 10 '[]'  - 0 Medium Wound Dressing one or multiple wounds '[]'  - 0 Large Wound Dressing one or multiple wounds X- 1 5 Application of Medications - topical '[]'  - 0 Application of Medications - injection INTERVENTIONS - Miscellaneous '[]'  - External ear exam 0 '[]'  - 0 Specimen Collection (cultures, biopsies, blood, body fluids, etc.) '[]'  - 0 Specimen(s) / Culture(s) sent or taken to Lab for analysis '[]'  - 0 Patient Transfer (multiple staff / Civil Service fast streamer / Similar devices) '[]'  - 0 Simple Staple / Suture removal (25 or less) '[]'  - 0 Complex Staple / Suture removal (26 or more) '[]'  - 0 Hypo / Hyperglycemic Management (close monitor of Blood Glucose) '[]'  - 0 Ankle / Brachial Index (ABI) - do not check if billed separately '[]'  - 0 Vital Signs Has the patient been seen at the hospital within the last three years: Yes Total Score: 70 Level Of Care: New/Established - Level 2 Electronic Signature(s) Signed: 12/12/2021 3:42:59 PM By: Carlene Coria RN Entered By: Carlene Coria on 12/12/2021 14:27:22 [name] ([id number]) -------------------------------------------------------------------------------- Encounter Discharge Information Details Patient Name: [name], [name] Date of Service: 12/12/2021 2:00 PM Medical Record Number: [id number] Patient Account Number: [id number] Date of Birth/Sex: 01-Apr-1941 (81 y.o. F) Treating RN: Carlene Coria Primary Care Agnes Brightbill: [name] Other Clinician: Referring Jenne Sellinger: [name] Treating Raeonna Milo/Extender: Skipper Cliche in Treatment: 14 Encounter Discharge Information Items Discharge Condition: Stable Ambulatory Status: Walker Discharge Destination: Home Transportation: Private Auto Accompanied By: self Schedule Follow-up Appointment: Yes Clinical Summary of Care: Patient Declined Electronic Signature(s) Signed: 12/12/2021 3:42:59 PM By: Carlene Coria RN Entered By: Carlene Coria on 12/12/2021 14:26:46 Spangler, [name] ([id number]) -------------------------------------------------------------------------------- Wound Assessment Details Patient Name: [name], [name] Date of Service: 12/12/2021 2:00 PM Medical Record Number: [id number] Patient Account Number: [id number] Date of Birth/Sex: 05-11-1941 (81 y.o. F) Treating RN: Carlene Coria Primary Care Dannis Deroche: [name] Other Clinician: Referring Herny Scurlock: [name] Treating Dashun Borre/Extender: Skipper Cliche in Treatment: 14 Wound Status Wound Number: 1 Primary Etiology: Venous Leg Ulcer Wound Location: Left, Anterior Lower Leg Wound Status: Open Wounding Event: Skin Tear/Laceration Comorbid History: Cataracts, Osteoarthritis, Received Radiation Date Acquired: 07/30/2021 Weeks Of Treatment: 14 Clustered Wound: No Wound Measurements Length: (cm) 1 Width: (cm) 0.5 Depth: (cm) 0.4 Area: (cm) 0.393 Volume: (cm) 0.157 % Reduction in Area: 68.7% % Reduction in Volume: 75% Epithelialization: Small (1-33%) Tunneling: No Undermining: No Wound Description Classification: Full Thickness Without Exposed Support Structures Wound Margin: Flat and Intact Exudate Amount: Medium Exudate Type: Serosanguineous Exudate Color: red, brown Foul Odor After Cleansing: No Slough/Fibrino Yes Wound Bed Granulation Amount: Small (1-33%) Exposed Structure Granulation Quality: Red Fascia Exposed:  No Necrotic Amount: Medium (34-66%) Fat Layer (Subcutaneous Tissue) Exposed: Yes Necrotic Quality: Adherent Slough Tendon Exposed: No Muscle Exposed: No Joint Exposed: No Bone Exposed: No Treatment Notes Wound #1 (Lower Leg) Wound Laterality: Left, Anterior Cleanser Byram Ancillary Kit - 15 Day Supply Discharge Instruction: Use supplies as instructed; Kit contains: (15) Saline Bullets; (15) 3x3 Gauze; 15 pr Gloves Soap and Water Discharge Instruction: Gently cleanse wound with antibacterial soap, rinse and pat dry prior to dressing  one wound '[]'  - 0 Complex Wound Cleansing - multiple wounds '[]'  - 0 Wound Imaging (photographs - any number of wounds) '[]'  - 0 Wound Tracing (instead of photographs) X- 1 5 Simple Wound Measurement - one wound '[]'  - 0 Complex Wound Measurement - multiple wounds INTERVENTIONS - Wound Dressings X - Small Wound Dressing one or multiple wounds 1 10 '[]'  - 0 Medium Wound Dressing one or multiple wounds '[]'  - 0 Large Wound Dressing one or multiple wounds X- 1 5 Application of Medications - topical '[]'  - 0 Application of Medications - injection INTERVENTIONS - Miscellaneous '[]'  - External ear exam 0 '[]'  - 0 Specimen Collection (cultures, biopsies, blood, body fluids, etc.) '[]'  - 0 Specimen(s) / Culture(s) sent or taken to Lab for analysis '[]'  - 0 Patient Transfer (multiple staff / Civil Service fast streamer / Similar devices) '[]'  - 0 Simple Staple / Suture removal (25 or less) '[]'  - 0 Complex Staple / Suture removal (26 or more) '[]'  - 0 Hypo / Hyperglycemic Management (close monitor of Blood Glucose) '[]'  - 0 Ankle / Brachial Index (ABI) - do not check if billed separately '[]'  - 0 Vital Signs Has the patient been seen at the hospital within the last three years: Yes Total Score: 70 Level Of Care: New/Established - Level 2 Electronic Signature(s) Signed: 12/12/2021 3:42:59 PM By: Carlene Coria RN Entered By: Carlene Coria on 12/12/2021 14:27:22 [name] ([id number]) -------------------------------------------------------------------------------- Encounter Discharge Information Details Patient Name: [name], [name] Date of Service: 12/12/2021 2:00 PM Medical Record Number: [id number] Patient Account Number: [id number] Date of Birth/Sex: 01-Apr-1941 (81 y.o. F) Treating RN: Carlene Coria Primary Care Agnes Brightbill: [name] Other Clinician: Referring Jenne Sellinger: [name] Treating Raeonna Milo/Extender: Skipper Cliche in Treatment: 14 Encounter Discharge Information Items Discharge Condition: Stable Ambulatory Status: Walker Discharge Destination: Home Transportation: Private Auto Accompanied By: self Schedule Follow-up Appointment: Yes Clinical Summary of Care: Patient Declined Electronic Signature(s) Signed: 12/12/2021 3:42:59 PM By: Carlene Coria RN Entered By: Carlene Coria on 12/12/2021 14:26:46 Spangler, [name] ([id number]) -------------------------------------------------------------------------------- Wound Assessment Details Patient Name: [name], [name] Date of Service: 12/12/2021 2:00 PM Medical Record Number: [id number] Patient Account Number: [id number] Date of Birth/Sex: 05-11-1941 (81 y.o. F) Treating RN: Carlene Coria Primary Care Dannis Deroche: [name] Other Clinician: Referring Herny Scurlock: [name] Treating Dashun Borre/Extender: Skipper Cliche in Treatment: 14 Wound Status Wound Number: 1 Primary Etiology: Venous Leg Ulcer Wound Location: Left, Anterior Lower Leg Wound Status: Open Wounding Event: Skin Tear/Laceration Comorbid History: Cataracts, Osteoarthritis, Received Radiation Date Acquired: 07/30/2021 Weeks Of Treatment: 14 Clustered Wound: No Wound Measurements Length: (cm) 1 Width: (cm) 0.5 Depth: (cm) 0.4 Area: (cm) 0.393 Volume: (cm) 0.157 % Reduction in Area: 68.7% % Reduction in Volume: 75% Epithelialization: Small (1-33%) Tunneling: No Undermining: No Wound Description Classification: Full Thickness Without Exposed Support Structures Wound Margin: Flat and Intact Exudate Amount: Medium Exudate Type: Serosanguineous Exudate Color: red, brown Foul Odor After Cleansing: No Slough/Fibrino Yes Wound Bed Granulation Amount: Small (1-33%) Exposed Structure Granulation Quality: Red Fascia Exposed:  No Necrotic Amount: Medium (34-66%) Fat Layer (Subcutaneous Tissue) Exposed: Yes Necrotic Quality: Adherent Slough Tendon Exposed: No Muscle Exposed: No Joint Exposed: No Bone Exposed: No Treatment Notes Wound #1 (Lower Leg) Wound Laterality: Left, Anterior Cleanser Byram Ancillary Kit - 15 Day Supply Discharge Instruction: Use supplies as instructed; Kit contains: (15) Saline Bullets; (15) 3x3 Gauze; 15 pr Gloves Soap and Water Discharge Instruction: Gently cleanse wound with antibacterial soap, rinse and pat dry prior to dressing

## 2021-12-13 NOTE — Progress Notes (Signed)
[name], [name] ([id number]) Visit Report for 12/12/2021 Physician Orders Details Patient Name: [name] [name] NOGUCHI. Date of Service: 12/12/2021 2:00 PM Medical Record Number: [id number] Patient Account Number: [id number] Date of Birth/Sex: 1941/02/12 (80 y.o. F) Treating RN: [name] [name] Primary Care Provider: [name] Other Clinician: Referring Provider: [name] Treating Provider/Extender: Skipper Cliche in Treatment: [phone] Verbal / Phone Orders: No Diagnosis Coding Follow-up Appointments o Return Appointment in 1 week. - once a week provider o Nurse Visit as needed - once per week Bathing/ Shower/ Hygiene o May shower with wound dressing protected with water repellent cover or cast protector. o No tub bath. Anesthetic (Use 'Patient Medications' Section for Anesthetic Order Entry) o Lidocaine applied to wound bed Edema Control - Lymphedema / Segmental Compressive Device / Other o Tubigrip single layer applied. - E o Elevate, Exercise Daily and Avoid Standing for Long Periods of Time. o Elevate legs to the level of the heart and pump ankles as often as possible o Elevate leg(s) parallel to the floor when sitting. Wound Treatment Wound #1 - Lower Leg Wound Laterality: Left, Anterior Cleanser: Byram Ancillary Kit - 15 Day Supply (Generic) 2 x Per Week/30 Days Discharge Instructions: Use supplies as instructed; Kit contains: (15) Saline Bullets; (15) 3x3 Gauze; 15 pr Gloves Cleanser: Soap and Water 2 x Per Week/30 Days Discharge Instructions: Gently cleanse wound with antibacterial soap, rinse and pat dry prior to dressing wounds Topical: Triamcinolone Acetonide Cream, 0.1%, 15 (g) tube 2 x Per Week/30 Days Discharge Instructions: Apply as directed by provider. Primary Dressing: Silvercel Small 2x2 (in/in) 2 x Per Week/30 Days Discharge Instructions: Apply Silvercel Small 2x2 (in/in) as instructed Secondary Dressing: Zetuvit Plus Silicone Non-bordered  5x5 (in/in) 2 x Per Week/30 Days Compression Wrap: Tubi E 2 x Per Week/30 Days Wound #2 - Lower Leg Wound Laterality: Left, Distal Cleanser: Byram Ancillary Kit - 15 Day Supply (Generic) 2 x Per Week/30 Days Discharge Instructions: Use supplies as instructed; Kit contains: (15) Saline Bullets; (15) 3x3 Gauze; 15 pr Gloves Cleanser: Soap and Water 2 x Per Week/30 Days Discharge Instructions: Gently cleanse wound with antibacterial soap, rinse and pat dry prior to dressing wounds Topical: Triamcinolone Acetonide Cream, 0.1%, 15 (g) tube 2 x Per Week/30 Days Discharge Instructions: Apply as directed by provider. Primary Dressing: Silvercel Small 2x2 (in/in) 2 x Per Week/30 Days Discharge Instructions: Apply Silvercel Small 2x2 (in/in) as instructed Secondary Dressing: Zetuvit Plus Silicone Non-bordered 5x5 (in/in) 2 x Per Week/30 Days Compression Wrap: Tubi E 2 x Per Week/30 Days [name], [name] ([id number]) Electronic Signature(s) Signed: 12/12/2021 3:42:59 PM By: [name] Coria RN Signed: 12/13/2021 4:48:19 PM By: [name] Keeler PA-C Entered By: [name] [name] on 12/12/2021 14:26:13 [name] [name] ([id number]) -------------------------------------------------------------------------------- SuperBill Details Patient Name: [name] [name] L. Date of Service: 12/12/2021 Medical Record Number: [id number] Patient Account Number: [id number] Date of Birth/Sex: 1941/07/14 (81 y.o. 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

## 2021-12-14 ENCOUNTER — Ambulatory Visit: Payer: PPO

## 2021-12-15 ENCOUNTER — Other Ambulatory Visit: Payer: Self-pay

## 2021-12-15 DIAGNOSIS — I872 Venous insufficiency (chronic) (peripheral): Secondary | ICD-10-CM | POA: Diagnosis not present

## 2021-12-15 NOTE — Progress Notes (Signed)
LATERIA, ALDERMAN ([id number]) Visit Report for 12/15/2021 Arrival Information Details Patient Name: GERALDYN, SHAIN. Date of Service: 12/15/2021 3:00 PM Medical Record Number: [id number] Patient Account Number: [id number] Date of Birth/Sex: 12-May-1941 (81 y.o. F) Treating RN: Levora Dredge Primary Care Antonieta Slaven: [name] Other Clinician: Referring Rhandi Despain: [name] Treating Tanazia Achee/Extender: Tito Dine in Treatment: 15 Visit Information History Since Last Visit Added or deleted any medications: No Patient Arrived: [name] Any new allergies or adverse reactions: No Arrival Time: 15:06 Had a fall or experienced change in No Accompanied By: self activities of daily living that may affect Transfer Assistance: None risk of falls: Patient Identification Verified: Yes Hospitalized since last visit: No Secondary Verification Process Completed: Yes Has Dressing in Place as Prescribed: Yes Patient Requires Transmission-Based Precautions: No Pain Present Now: Yes Patient Has Alerts: No Electronic Signature(s) Signed: 12/15/2021 4:47:00 PM By: Levora Dredge Entered By: Levora Dredge on 12/15/2021 15:09:12 Lundquist, [name] ([id number]) -------------------------------------------------------------------------------- Clinic Level of Care Assessment Details Patient Name: Gabler, Elma L. Date of Service: 12/15/2021 3:00 PM Medical Record Number: [id number] Patient Account Number: [id number] Date of Birth/Sex: [birth year] (81 y.o. F) Treating RN: Levora Dredge Primary Care Caleigh Rabelo: [name] Other Clinician: Referring Cariann Kinnamon: [name] Treating Sol Englert/Extender: Tito Dine in Treatment: 15 Clinic Level of Care Assessment Items TOOL 4 Quantity Score X - Use when only an EandM is performed on FOLLOW-UP visit 1 0 ASSESSMENTS - Nursing Assessment / Reassessment []  - Reassessment of Co-morbidities (includes updates in patient  status) 0 []  - 0 Reassessment of Adherence to Treatment Plan ASSESSMENTS - Wound and Skin Assessment / Reassessment X - Simple Wound Assessment / Reassessment - one wound 1 5 []  - 0 Complex Wound Assessment / Reassessment - multiple wounds []  - 0 Dermatologic / Skin Assessment (not related to wound area) ASSESSMENTS - Focused Assessment []  - Circumferential Edema Measurements - multi extremities 0 []  - 0 Nutritional Assessment / Counseling / Intervention []  - 0 Lower Extremity Assessment (monofilament, tuning fork, pulses) []  - 0 Peripheral Arterial Disease Assessment (using hand held doppler) ASSESSMENTS - Ostomy and/or Continence Assessment and Care []  - Incontinence Assessment and Management 0 []  - 0 Ostomy Care Assessment and Management (repouching, etc.) PROCESS - Coordination of Care X - Simple Patient / Family Education for ongoing care 1 15 []  - 0 Complex (extensive) Patient / Family Education for ongoing care []  - 0 Staff obtains Programmer, systems, Records, Test Results / Process Orders []  - 0 Staff telephones HHA, Nursing Homes / Clarify orders / etc []  - 0 Routine Transfer to another Facility (non-emergent condition) []  - 0 Routine Hospital Admission (non-emergent condition) []  - 0 New Admissions / Biomedical engineer / Ordering NPWT, Apligraf, etc. []  - 0 Emergency Hospital Admission (emergent condition) X- 1 10 Simple Discharge Coordination []  - 0 Complex (extensive) Discharge Coordination PROCESS - Special Needs []  - Pediatric / Minor Patient Management 0 []  - 0 Isolation Patient Management []  - 0 Hearing / Language / Visual special needs []  - 0 Assessment of Community assistance (transportation, D/C planning, etc.) []  - 0 Additional assistance / Altered mentation []  - 0 Support Surface(s) Assessment (bed, cushion, seat, etc.) INTERVENTIONS - Wound Cleansing / Measurement Smoak, Pahola L. ([id number]) X- 1 5 Simple Wound Cleansing - one wound []  -  0 Complex Wound Cleansing - multiple wounds X- 1 5 Wound Imaging (photographs - any number of wounds) []  - 0 Wound Tracing (instead of photographs) []  - 0 Simple Wound Measurement -  g) tube Discharge Instruction: Apply as directed by Shrey Boike. Primary Dressing Silvercel Small 2x2 (in/in) Discharge Instruction: Apply Silvercel Small 2x2 (in/in) as instructed Secondary Dressing Zetuvit Plus Silicone Non-bordered 5x5 (in/in) Secured With Coban Cohesive Bandage 4x5 (yds) Stretched Discharge Instruction: Apply Coban as directed. Kerlix Roll Sterile or Non-Sterile 6-ply 4.5x4 (yd/yd) Discharge Instruction: Apply Kerlix as directed Compression Wrap Compression Stockings Add-Ons Electronic Signature(s) Signed: 12/15/2021 4:47:00 PM By: Levora Dredge Entered By: Levora Dredge on 12/15/2021 15:22:50 Berkey, [name] ([id number]) -------------------------------------------------------------------------------- Wound Assessment Details Patient Name: Lastinger, Trinisha L. Date of Service: 12/15/2021 3:00 PM Medical Record Number: [id number] Patient Account Number: [id number] Date of Birth/Sex: February 26, 1941 (81 y.o. F) Treating RN: Levora Dredge Primary Care Nanci Lakatos: [name] Other Clinician: Referring Naif Alabi: [name] Treating Twala Collings/Extender: Tito Dine in Treatment: 15 Wound Status Wound Number: 2 Primary Etiology: Venous Leg Ulcer Wound Location: Left, Distal Lower Leg Wound Status: Open Wounding Event: Trauma Comorbid History: Cataracts, Osteoarthritis, Received Radiation Date Acquired:  12/04/2021 Weeks Of Treatment: 1 Clustered Wound: No Wound Measurements Length: (cm) 1 Width: (cm) 0.5 Depth: (cm) 0.1 Area: (cm) 0.393 Volume: (cm) 0.039 % Reduction in Area: 0% % Reduction in Volume: 0% Epithelialization: None Tunneling: No Undermining: No Wound Description Classification: Full Thickness Without Exposed Support Structu Exudate Amount: Medium Exudate Type: Serosanguineous Exudate Color: red, brown res Foul Odor After Cleansing: No Slough/Fibrino Yes Wound Bed Granulation Amount: None Present (0%) Exposed Structure Necrotic Amount: Large (67-100%) Fascia Exposed: No Necrotic Quality: Eschar Fat Layer (Subcutaneous Tissue) Exposed: No Tendon Exposed: No Muscle Exposed: No Joint Exposed: No Bone Exposed: No Treatment Notes Wound #2 (Lower Leg) Wound Laterality: Left, Distal Cleanser Soap and Water Discharge Instruction: Gently cleanse wound with antibacterial soap, rinse and pat dry prior to dressing wounds Peri-Wound Care Topical Triamcinolone Acetonide Cream, 0.1%, 15 (g) tube Discharge Instruction: Apply as directed by Daquan Crapps. Primary Dressing Silvercel Small 2x2 (in/in) Discharge Instruction: Apply Silvercel Small 2x2 (in/in) as instructed Secondary Dressing Zetuvit Plus Silicone Non-bordered 5x5 (in/in) Secured With Coban Cohesive Bandage 4x5 (yds) Stretched Discharge Instruction: Apply Coban as directed. Kerlix Roll Sterile or Non-Sterile 6-ply 4.5x4 (yd/yd) Bricco, Jadalee L. ([id number]) Discharge Instruction: Apply Kerlix as directed Compression Wrap Compression Stockings Add-Ons Electronic Signature(s) Signed: 12/15/2021 4:47:00 PM By: Levora Dredge Entered By: Levora Dredge on 12/15/2021 15:41:38  g) tube Discharge Instruction: Apply as directed by Shrey Boike. Primary Dressing Silvercel Small 2x2 (in/in) Discharge Instruction: Apply Silvercel Small 2x2 (in/in) as instructed Secondary Dressing Zetuvit Plus Silicone Non-bordered 5x5 (in/in) Secured With Coban Cohesive Bandage 4x5 (yds) Stretched Discharge Instruction: Apply Coban as directed. Kerlix Roll Sterile or Non-Sterile 6-ply 4.5x4 (yd/yd) Discharge Instruction: Apply Kerlix as directed Compression Wrap Compression Stockings Add-Ons Electronic Signature(s) Signed: 12/15/2021 4:47:00 PM By: Levora Dredge Entered By: Levora Dredge on 12/15/2021 15:22:50 Berkey, [name] ([id number]) -------------------------------------------------------------------------------- Wound Assessment Details Patient Name: Lastinger, Trinisha L. Date of Service: 12/15/2021 3:00 PM Medical Record Number: [id number] Patient Account Number: [id number] Date of Birth/Sex: February 26, 1941 (81 y.o. F) Treating RN: Levora Dredge Primary Care Nanci Lakatos: [name] Other Clinician: Referring Naif Alabi: [name] Treating Twala Collings/Extender: Tito Dine in Treatment: 15 Wound Status Wound Number: 2 Primary Etiology: Venous Leg Ulcer Wound Location: Left, Distal Lower Leg Wound Status: Open Wounding Event: Trauma Comorbid History: Cataracts, Osteoarthritis, Received Radiation Date Acquired:  12/04/2021 Weeks Of Treatment: 1 Clustered Wound: No Wound Measurements Length: (cm) 1 Width: (cm) 0.5 Depth: (cm) 0.1 Area: (cm) 0.393 Volume: (cm) 0.039 % Reduction in Area: 0% % Reduction in Volume: 0% Epithelialization: None Tunneling: No Undermining: No Wound Description Classification: Full Thickness Without Exposed Support Structu Exudate Amount: Medium Exudate Type: Serosanguineous Exudate Color: red, brown res Foul Odor After Cleansing: No Slough/Fibrino Yes Wound Bed Granulation Amount: None Present (0%) Exposed Structure Necrotic Amount: Large (67-100%) Fascia Exposed: No Necrotic Quality: Eschar Fat Layer (Subcutaneous Tissue) Exposed: No Tendon Exposed: No Muscle Exposed: No Joint Exposed: No Bone Exposed: No Treatment Notes Wound #2 (Lower Leg) Wound Laterality: Left, Distal Cleanser Soap and Water Discharge Instruction: Gently cleanse wound with antibacterial soap, rinse and pat dry prior to dressing wounds Peri-Wound Care Topical Triamcinolone Acetonide Cream, 0.1%, 15 (g) tube Discharge Instruction: Apply as directed by Daquan Crapps. Primary Dressing Silvercel Small 2x2 (in/in) Discharge Instruction: Apply Silvercel Small 2x2 (in/in) as instructed Secondary Dressing Zetuvit Plus Silicone Non-bordered 5x5 (in/in) Secured With Coban Cohesive Bandage 4x5 (yds) Stretched Discharge Instruction: Apply Coban as directed. Kerlix Roll Sterile or Non-Sterile 6-ply 4.5x4 (yd/yd) Bricco, Jadalee L. ([id number]) Discharge Instruction: Apply Kerlix as directed Compression Wrap Compression Stockings Add-Ons Electronic Signature(s) Signed: 12/15/2021 4:47:00 PM By: Levora Dredge Entered By: Levora Dredge on 12/15/2021 15:41:38

## 2021-12-18 ENCOUNTER — Other Ambulatory Visit: Payer: Self-pay

## 2021-12-18 ENCOUNTER — Other Ambulatory Visit (INDEPENDENT_AMBULATORY_CARE_PROVIDER_SITE_OTHER): Payer: Self-pay | Admitting: Internal Medicine

## 2021-12-18 DIAGNOSIS — M7989 Other specified soft tissue disorders: Secondary | ICD-10-CM

## 2021-12-18 DIAGNOSIS — R238 Other skin changes: Secondary | ICD-10-CM

## 2021-12-18 DIAGNOSIS — M79606 Pain in leg, unspecified: Secondary | ICD-10-CM

## 2021-12-18 DIAGNOSIS — I872 Venous insufficiency (chronic) (peripheral): Secondary | ICD-10-CM | POA: Diagnosis not present

## 2021-12-18 DIAGNOSIS — S81809S Unspecified open wound, unspecified lower leg, sequela: Secondary | ICD-10-CM

## 2021-12-18 NOTE — Progress Notes (Signed)
[name], [name] ([id number]) Visit Report for 12/18/2021 Physician Orders Details Patient Name: [name], [name]. Date of Service: 12/18/2021 11:30 AM Medical Record Number: [id number] Patient Account Number: [id number] Date of Birth/Sex: 05-20-1941 (81 y.o. F) Treating RN: Donnamarie Poag Primary Care Provider: [name] Other Clinician: Referring Provider: [name] Treating Provider/Extender: Skipper Cliche in Treatment: 15 Verbal / Phone Orders: No Diagnosis Coding Follow-up Appointments o Return Appointment in 1 week. - once a week provider o Nurse Visit as needed - once per week Bathing/ Shower/ Hygiene o May shower with wound dressing protected with water repellent cover or cast protector. o No tub bath. Anesthetic (Use 'Patient Medications' Section for Anesthetic Order Entry) o Lidocaine applied to wound bed Edema Control - Lymphedema / Segmental Compressive Device / Other o Patient to wear own compression stockings. Remove compression stockings every night before going to bed and put on every morning when getting up. - right leg o Elevate, Exercise Daily and Avoid Standing for Long Periods of Time. o Elevate legs to the level of the heart and pump ankles as often as possible o Elevate leg(s) parallel to the floor when sitting. o DO YOUR BEST to sleep in the bed at night. DO NOT sleep in your recliner. Long hours of sitting in a recliner leads to swelling of the legs and/or potential wounds on your backside. Additional Orders / Instructions o Follow Nutritious Diet and Increase Protein Intake Wound Treatment Wound #1 - Lower Leg Wound Laterality: Left, Anterior Cleanser: Soap and Water 2 x Per Week/30 Days Discharge Instructions: Gently cleanse wound with antibacterial soap, rinse and pat dry prior to dressing wounds Topical: Triamcinolone Acetonide Cream, 0.1%, 15 (g) tube 2 x Per Week/30 Days Discharge Instructions: Apply as directed by  provider. Primary Dressing: Silvercel Small 2x2 (in/in) 2 x Per Week/30 Days Discharge Instructions: Apply Silvercel Small 2x2 (in/in) as instructed Secondary Dressing: Zetuvit Plus Silicone Non-bordered 5x5 (in/in) 2 x Per Week/30 Days Secured With: Coban Cohesive Bandage 4x5 (yds) Stretched 2 x Per Week/30 Days Discharge Instructions: Apply Coban as directed. Secured With: [employer] or Non-Sterile 6-ply 4.5x4 (yd/yd) 2 x Per Week/30 Days Discharge Instructions: Apply Kerlix as directed Wound #2 - Lower Leg Wound Laterality: Left, Distal Cleanser: Soap and Water 2 x Per Week/30 Days Discharge Instructions: Gently cleanse wound with antibacterial soap, rinse and pat dry prior to dressing wounds Topical: Triamcinolone Acetonide Cream, 0.1%, 15 (g) tube 2 x Per Week/30 Days Discharge Instructions: Apply as directed by provider. Primary Dressing: Silvercel Small 2x2 (in/in) 2 x Per Week/30 Days Discharge Instructions: Apply Silvercel Small 2x2 (in/in) as instructed [name], [name] ([id number]) Secondary Dressing: Zetuvit Plus Silicone Non-bordered 5x5 (in/in) 2 x Per Week/30 Days Secured With: Coban Cohesive Bandage 4x5 (yds) Stretched 2 x Per Week/30 Days Discharge Instructions: Apply Coban as directed. Secured With: [employer] or Non-Sterile 6-ply 4.5x4 (yd/yd) 2 x Per Week/30 Days Discharge Instructions: Apply Kerlix as directed Electronic Signature(s) Signed: 12/18/2021 4:33:32 PM By: Donnamarie Poag Signed: 12/18/2021 4:34:41 PM By: Worthy Keeler PA-C Entered By: Donnamarie Poag on 12/18/2021 12:59:56 [name], [name] ([id number]) -------------------------------------------------------------------------------- [place] Details Patient Name: [name], [name] Date of Service: 12/18/2021 Medical Record Number: [id number] Patient Account Number: [id number] Date of Birth/Sex: [birth year] (81 y.o. 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

## 2021-12-18 NOTE — Progress Notes (Signed)
ADA, HOLNESS ([id number]) Visit Report for 12/15/2021 Physician Orders Details Patient Name: [name], [name]. Date of Service: 12/15/2021 3:00 PM Medical Record Number: [id number] Patient Account Number: [id number] Date of Birth/Sex: 04-28-1941 (81 y.o. F) Treating RN: Cornell Barman Primary Care Provider: [name] Other Clinician: Referring Provider: [name] Treating Provider/Extender: Tito Dine in Treatment: 15 Verbal / Phone Orders: No Diagnosis Coding Follow-up Appointments o Return Appointment in 1 week. - once a week provider o Nurse Visit as needed - once per week Bathing/ Shower/ Hygiene o May shower with wound dressing protected with water repellent cover or cast protector. o No tub bath. Edema Control - Lymphedema / Segmental Compressive Device / Other o Elevate, Exercise Daily and Avoid Standing for Long Periods of Time. o Elevate legs to the level of the heart and pump ankles as often as possible o Elevate leg(s) parallel to the floor when sitting. Wound Treatment Wound #1 - Lower Leg Wound Laterality: Left, Anterior Cleanser: Soap and Water 2 x Per Week/30 Days Discharge Instructions: Gently cleanse wound with antibacterial soap, rinse and pat dry prior to dressing wounds Topical: Triamcinolone Acetonide Cream, 0.1%, 15 (g) tube 2 x Per Week/30 Days Discharge Instructions: Apply as directed by provider. Primary Dressing: Silvercel Small 2x2 (in/in) 2 x Per Week/30 Days Discharge Instructions: Apply Silvercel Small 2x2 (in/in) as instructed Secondary Dressing: Zetuvit Plus Silicone Non-bordered 5x5 (in/in) 2 x Per Week/30 Days Secured With: Coban Cohesive Bandage 4x5 (yds) Stretched 2 x Per Week/30 Days Discharge Instructions: Apply Coban as directed. Secured With: [employer] or Non-Sterile 6-ply 4.5x4 (yd/yd) 2 x Per Week/30 Days Discharge Instructions: Apply Kerlix as directed Wound #2 - Lower Leg Wound Laterality:  Left, Distal Cleanser: Soap and Water 2 x Per Week/30 Days Discharge Instructions: Gently cleanse wound with antibacterial soap, rinse and pat dry prior to dressing wounds Topical: Triamcinolone Acetonide Cream, 0.1%, 15 (g) tube 2 x Per Week/30 Days Discharge Instructions: Apply as directed by provider. Primary Dressing: Silvercel Small 2x2 (in/in) 2 x Per Week/30 Days Discharge Instructions: Apply Silvercel Small 2x2 (in/in) as instructed Secondary Dressing: Zetuvit Plus Silicone Non-bordered 5x5 (in/in) 2 x Per Week/30 Days Secured With: Coban Cohesive Bandage 4x5 (yds) Stretched 2 x Per Week/30 Days Discharge Instructions: Apply Coban as directed. Secured With: [employer] or Non-Sterile 6-ply 4.5x4 (yd/yd) 2 x Per Week/30 Days Discharge Instructions: Apply Kerlix as directed Schiano, Jerah [name]. ([id number]) Services and Therapies o Venous Duplex Doppler - Bilateral Venous Reflux studies and DVT study on left lower extremity. Electronic Signature(s) Signed: 12/15/2021 4:47:00 PM By: Levora Dredge Signed: 12/18/2021 11:09:04 AM By: Linton Ham MD Previous Signature: 12/15/2021 3:33:45 PM Version By: Gretta Cool BSN, RN, CWS, Kim RN, BSN Entered By: Levora Dredge on 12/15/2021 15:45:07 Bolla, [name] ([id number]) -------------------------------------------------------------------------------- [place] Details Patient Name: [name]. Date of Service: 12/15/2021 Medical Record Number: [id number] Patient Account Number: [id number] Date of Birth/Sex: 1941/11/17 (81 y.o. 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

## 2021-12-18 NOTE — Progress Notes (Signed)
STARLENA, BEIL ([id number]) Visit Report for 12/18/2021 Arrival Information Details Patient Name: [name], [name]. Date of Service: 12/18/2021 11:30 AM Medical Record Number: [id number] Patient Account Number: [id number] Date of Birth/Sex: October 26, 1941 (80 y.o. F) Treating RN: Levora Dredge Primary Care Ahlam Piscitelli: [name] Other Clinician: Referring Yadier Bramhall: [name] Treating Hermenia Fritcher/Extender: Skipper Cliche in Treatment: 15 Visit Information History Since Last Visit Added or deleted any medications: No Patient Arrived: [name] Any new allergies or adverse reactions: No Arrival Time: 11:49 Had a fall or experienced change in No Accompanied By: self activities of daily living that may affect Transfer Assistance: None risk of falls: Patient Requires Transmission-Based Precautions: No Hospitalized since last visit: No Patient Has Alerts: No Has Dressing in Place as Prescribed: Yes Pain Present Now: Yes Notes pt states no pain at wound site, pt states pain in calf states muscle pain. area is sore. Last visit kerlix and coban wrap was placed. Patient states could not tolerate the wrap and removed it that evening. Electronic Signature(s) Signed: 12/18/2021 4:44:26 PM By: Levora Dredge Entered By: Levora Dredge on 12/18/2021 11:51:56 Lasker, [name] ([id number]) -------------------------------------------------------------------------------- Clinic Level of Care Assessment Details Patient Name: [name], [name] Date of Service: 12/18/2021 11:30 AM Medical Record Number: [id number] Patient Account Number: [id number] Date of Birth/Sex: [birth year] (80 y.o. F) Treating RN: Donnamarie Poag Primary Care Taisley Mordan: [name] Other Clinician: Referring Jalayna Josten: [name] Treating Bandy Honaker/Extender: Skipper Cliche in Treatment: 15 Clinic Level of Care Assessment Items TOOL 4 Quantity Score []  - Use when only an EandM is performed on FOLLOW-UP visit  0 ASSESSMENTS - Nursing Assessment / Reassessment []  - Reassessment of Co-morbidities (includes updates in patient status) 0 []  - 0 Reassessment of Adherence to Treatment Plan ASSESSMENTS - Wound and Skin Assessment / Reassessment X - Simple Wound Assessment / Reassessment - one wound 1 5 []  - 0 Complex Wound Assessment / Reassessment - multiple wounds []  - 0 Dermatologic / Skin Assessment (not related to wound area) ASSESSMENTS - Focused Assessment []  - Circumferential Edema Measurements - multi extremities 0 []  - 0 Nutritional Assessment / Counseling / Intervention []  - 0 Lower Extremity Assessment (monofilament, tuning fork, pulses) []  - 0 Peripheral Arterial Disease Assessment (using hand held doppler) ASSESSMENTS - Ostomy and/or Continence Assessment and Care []  - Incontinence Assessment and Management 0 []  - 0 Ostomy Care Assessment and Management (repouching, etc.) PROCESS - Coordination of Care X - Simple Patient / Family Education for ongoing care 1 15 []  - 0 Complex (extensive) Patient / Family Education for ongoing care []  - 0 Staff obtains Programmer, systems, Records, Test Results / Process Orders []  - 0 Staff telephones HHA, Nursing Homes / Clarify orders / etc []  - 0 Routine Transfer to another Facility (non-emergent condition) []  - 0 Routine Hospital Admission (non-emergent condition) []  - 0 New Admissions / Biomedical engineer / Ordering NPWT, Apligraf, etc. []  - 0 Emergency Hospital Admission (emergent condition) X- 1 10 Simple Discharge Coordination []  - 0 Complex (extensive) Discharge Coordination PROCESS - Special Needs []  - Pediatric / Minor Patient Management 0 []  - 0 Isolation Patient Management []  - 0 Hearing / Language / Visual special needs []  - 0 Assessment of Community assistance (transportation, D/C planning, etc.) []  - 0 Additional assistance / Altered mentation []  - 0 Support Surface(s) Assessment (bed, cushion, seat,  etc.) INTERVENTIONS - Wound Cleansing / Measurement Mcnutt, Daejah L. ([id number]) X- 1 5 Simple Wound Cleansing - one wound []  - 0 Complex Wound Cleansing -  STARLENA, BEIL ([id number]) Visit Report for 12/18/2021 Arrival Information Details Patient Name: [name], [name]. Date of Service: 12/18/2021 11:30 AM Medical Record Number: [id number] Patient Account Number: [id number] Date of Birth/Sex: October 26, 1941 (80 y.o. F) Treating RN: Levora Dredge Primary Care Ahlam Piscitelli: [name] Other Clinician: Referring Yadier Bramhall: [name] Treating Hermenia Fritcher/Extender: Skipper Cliche in Treatment: 15 Visit Information History Since Last Visit Added or deleted any medications: No Patient Arrived: [name] Any new allergies or adverse reactions: No Arrival Time: 11:49 Had a fall or experienced change in No Accompanied By: self activities of daily living that may affect Transfer Assistance: None risk of falls: Patient Requires Transmission-Based Precautions: No Hospitalized since last visit: No Patient Has Alerts: No Has Dressing in Place as Prescribed: Yes Pain Present Now: Yes Notes pt states no pain at wound site, pt states pain in calf states muscle pain. area is sore. Last visit kerlix and coban wrap was placed. Patient states could not tolerate the wrap and removed it that evening. Electronic Signature(s) Signed: 12/18/2021 4:44:26 PM By: Levora Dredge Entered By: Levora Dredge on 12/18/2021 11:51:56 Lasker, [name] ([id number]) -------------------------------------------------------------------------------- Clinic Level of Care Assessment Details Patient Name: [name], [name] Date of Service: 12/18/2021 11:30 AM Medical Record Number: [id number] Patient Account Number: [id number] Date of Birth/Sex: [birth year] (80 y.o. F) Treating RN: Donnamarie Poag Primary Care Taisley Mordan: [name] Other Clinician: Referring Jalayna Josten: [name] Treating Bandy Honaker/Extender: Skipper Cliche in Treatment: 15 Clinic Level of Care Assessment Items TOOL 4 Quantity Score []  - Use when only an EandM is performed on FOLLOW-UP visit  0 ASSESSMENTS - Nursing Assessment / Reassessment []  - Reassessment of Co-morbidities (includes updates in patient status) 0 []  - 0 Reassessment of Adherence to Treatment Plan ASSESSMENTS - Wound and Skin Assessment / Reassessment X - Simple Wound Assessment / Reassessment - one wound 1 5 []  - 0 Complex Wound Assessment / Reassessment - multiple wounds []  - 0 Dermatologic / Skin Assessment (not related to wound area) ASSESSMENTS - Focused Assessment []  - Circumferential Edema Measurements - multi extremities 0 []  - 0 Nutritional Assessment / Counseling / Intervention []  - 0 Lower Extremity Assessment (monofilament, tuning fork, pulses) []  - 0 Peripheral Arterial Disease Assessment (using hand held doppler) ASSESSMENTS - Ostomy and/or Continence Assessment and Care []  - Incontinence Assessment and Management 0 []  - 0 Ostomy Care Assessment and Management (repouching, etc.) PROCESS - Coordination of Care X - Simple Patient / Family Education for ongoing care 1 15 []  - 0 Complex (extensive) Patient / Family Education for ongoing care []  - 0 Staff obtains Programmer, systems, Records, Test Results / Process Orders []  - 0 Staff telephones HHA, Nursing Homes / Clarify orders / etc []  - 0 Routine Transfer to another Facility (non-emergent condition) []  - 0 Routine Hospital Admission (non-emergent condition) []  - 0 New Admissions / Biomedical engineer / Ordering NPWT, Apligraf, etc. []  - 0 Emergency Hospital Admission (emergent condition) X- 1 10 Simple Discharge Coordination []  - 0 Complex (extensive) Discharge Coordination PROCESS - Special Needs []  - Pediatric / Minor Patient Management 0 []  - 0 Isolation Patient Management []  - 0 Hearing / Language / Visual special needs []  - 0 Assessment of Community assistance (transportation, D/C planning, etc.) []  - 0 Additional assistance / Altered mentation []  - 0 Support Surface(s) Assessment (bed, cushion, seat,  etc.) INTERVENTIONS - Wound Cleansing / Measurement Mcnutt, Daejah L. ([id number]) X- 1 5 Simple Wound Cleansing - one wound []  - 0 Complex Wound Cleansing -  multiple wounds []  - 0 Wound Imaging (photographs - any number of wounds) []  - 0 Wound Tracing (instead of photographs) X- 1 5 Simple Wound Measurement - one wound []  - 0 Complex Wound Measurement - multiple wounds INTERVENTIONS - Wound Dressings X - Small Wound Dressing one or multiple wounds 1 10 []  - 0 Medium Wound Dressing one or multiple wounds []  - 0 Large Wound Dressing one or multiple wounds []  - 0 Application of Medications - topical []  - 0 Application of Medications - injection INTERVENTIONS - Miscellaneous []  - External ear exam 0 []  - 0 Specimen Collection (cultures, biopsies, blood, body fluids, etc.) []  - 0 Specimen(s) / Culture(s) sent or taken to Lab for analysis []  - 0 Patient Transfer (multiple staff / Civil Service fast streamer / Similar devices) []  - 0 Simple Staple / Suture removal (25 or less) []  - 0 Complex Staple / Suture removal (26 or more) []  - 0 Hypo / Hyperglycemic Management (close monitor of Blood Glucose) []  - 0 Ankle / Brachial Index (ABI) - do not check if billed separately []  - 0 Vital Signs Has the patient been seen at the hospital within the last three years: Yes Total Score: 50 Level Of Care: New/Established - Level 2 Electronic Signature(s) Signed: 12/18/2021 4:33:32 PM By: Donnamarie Poag Entered By: Donnamarie Poag on 12/18/2021 13:01:27 Belizaire, [name] ([id number]) -------------------------------------------------------------------------------- Encounter Discharge Information Details Patient Name: Pesqueira, Nika L. Date of Service: 12/18/2021 11:30 AM Medical Record Number: [id number] Patient Account Number: [id number] Date of Birth/Sex: 1940/12/28 (80 y.o. F) Treating RN: Donnamarie Poag Primary Care Markesia Crilly: [name] Other Clinician: Referring Aubriella Perezgarcia: [name] Treating  Edwena Mayorga/Extender: Skipper Cliche in Treatment: 15 Encounter Discharge Information Items Discharge Condition: Stable Ambulatory Status: Walker Discharge Destination: Home Transportation: Private Auto Accompanied By: self Schedule Follow-up Appointment: Yes Clinical Summary of Care: Electronic Signature(s) Signed: 12/18/2021 4:33:32 PM By: Donnamarie Poag Entered By: Donnamarie Poag on 12/18/2021 13:00:53 Ocampo, [name] ([id number]) -------------------------------------------------------------------------------- Wound Assessment Details Patient Name: Bevill, Kam L. Date of Service: 12/18/2021 11:30 AM Medical Record Number: [id number] Patient Account Number: [id number] Date of Birth/Sex: 01/14/1941 (80 y.o. F) Treating RN: Donnamarie Poag Primary Care Nathanial Arrighi: [name] Other Clinician: Referring Arron Tetrault: [name] Treating Kadelyn Dimascio/Extender: Jeri Cos Weeks in Treatment: 15 Wound Status Wound Number: 1 Primary Etiology: Venous Leg Ulcer Wound Location: Left, Anterior Lower Leg Wound Status: Open Wounding Event: Skin Tear/Laceration Comorbid History: Cataracts, Osteoarthritis, Received Radiation Date Acquired: 07/30/2021 Weeks Of Treatment: 15 Clustered Wound: No Wound Measurements Length: (cm) 1 Width: (cm) 0.5 Depth: (cm) 0.4 Area: (cm) 0.393 Volume: (cm) 0.157 % Reduction in Area: 68.7% % Reduction in Volume: 75% Epithelialization: Small (1-33%) Wound Description Classification: Full Thickness Without Exposed Support Structures Wound Margin: Flat and Intact Exudate Amount: Medium Exudate Type: Serosanguineous Exudate Color: red, brown Foul Odor After Cleansing: No Slough/Fibrino Yes Wound Bed Granulation Amount: Small (1-33%) Exposed Structure Granulation Quality: Red Fascia Exposed: No Necrotic Amount: Medium (34-66%) Fat Layer (Subcutaneous Tissue) Exposed: Yes Necrotic Quality: Adherent Slough Tendon Exposed: No Muscle Exposed: No Joint  Exposed: No Bone Exposed: No Treatment Notes Wound #1 (Lower Leg) Wound Laterality: Left, Anterior Cleanser Soap and Water Discharge Instruction: Gently cleanse wound with antibacterial soap, rinse and pat dry prior to dressing wounds Peri-Wound Care Topical Triamcinolone Acetonide Cream, 0.1%, 15 (g) tube Discharge Instruction: Apply as directed by Leeandre Nordling. Primary Dressing Silvercel Small 2x2 (in/in) Discharge Instruction: Apply Silvercel Small 2x2 (in/in) as instructed Secondary Dressing Zetuvit Plus Silicone Non-bordered 5x5 (in/in)

## 2021-12-20 ENCOUNTER — Encounter (HOSPITAL_BASED_OUTPATIENT_CLINIC_OR_DEPARTMENT_OTHER): Payer: PPO | Admitting: Internal Medicine

## 2021-12-20 ENCOUNTER — Other Ambulatory Visit: Payer: Self-pay

## 2021-12-20 DIAGNOSIS — L97822 Non-pressure chronic ulcer of other part of left lower leg with fat layer exposed: Secondary | ICD-10-CM

## 2021-12-20 DIAGNOSIS — I872 Venous insufficiency (chronic) (peripheral): Secondary | ICD-10-CM | POA: Diagnosis not present

## 2021-12-20 NOTE — Progress Notes (Deleted)
Subjective:   [name] is a 81 y.o. female who presents for Medicare Annual (Subsequent) preventive examination.  I connected with [name] today by telephone and verified that I am speaking with the correct person using two identifiers. Location patient: home Location provider: work Persons participating in the virtual visit: patient, Marine scientist.    I discussed the limitations, risks, security and privacy concerns of performing an evaluation and management service by telephone and the availability of in person appointments. I also discussed with the patient that there may be a patient responsible charge related to this service. The patient expressed understanding and verbally consented to this telephonic visit.    Interactive audio and video telecommunications were attempted between this provider and patient, however failed, due to patient having technical difficulties OR patient did not have access to video capability.  We continued and completed visit with audio only.  Some vital signs may be absent or patient reported.   Time Spent with patient on telephone encounter: *** minutes  Review of Systems           Objective:    There were no vitals filed for this visit. There is no height or weight on file to calculate BMI.  Advanced Directives 07/30/2021 04/13/2020 09/05/2018 04/26/2017  Does Patient Have a Medical Advance Directive? No No No No  Would patient like information on creating a medical advance directive? No - Patient declined No - Patient declined No - Patient declined -    Current Medications (verified) Outpatient Encounter Medications as of 12/21/2021  Medication Sig   acetaminophen (TYLENOL) 325 MG tablet Take 2 tablets (650 mg total) by mouth 3 (three) times daily as needed.   ibuprofen (ADVIL) 200 MG tablet Take 1-2 tablets (200-400 mg total) by mouth every 8 (eight) hours as needed (with food, for knee pain).   LORazepam (ATIVAN) 1 MG tablet Take 1/4-1/2  tablet by mouth every 8 hours as needed   Multiple Vitamins-Minerals (PRESERVISION AREDS 2 PO) Take by mouth.   triamcinolone cream (KENALOG) 0.1 % Apply 1 application topically 2 (two) times daily. On the right leg   No facility-administered encounter medications on file as of 12/21/2021.    Allergies (verified) Lasix [furosemide], Lipitor [atorvastatin], Lisinopril, Metoprolol succinate, Nitrofurantoin, Sulfadiazine, and Yellow jacket venom [bee venom]   History: Past Medical History:  Diagnosis Date   Anxiety    Arthritis    Breast cancer ([place]) 2001   s/p rady and lumpectomy   Carcinoma in situ of breast 2001   [place]   Esophageal reflux    Fatty liver    Fibromyalgia    Hx of adenomatous colonic polyps    Labyrinthitis, unspecified    Pure hypercholesterolemia    Unspecified essential hypertension    Urinary tract infection, site not specified    Past Surgical History:  Procedure Laterality Date   BREAST BIOPSY  07/2000   positive right side, [facility]    BREAST LUMPECTOMY  08/20/2000   right breast, malignant stage I, [facility]   CATARACT EXTRACTION  1988, 1990   FOOT SURGERY  1980   minor    Family History  Problem Relation Age of Onset   Prostate cancer Brother    Heart disease Brother    Pancreatitis Brother        x 2   Prostate cancer Brother    Alzheimer's disease Father    Dementia Father    Parkinsonism Mother  Heart disease Brother    Heart disease Brother    Breast cancer Paternal Aunt    Colon cancer Neg Hx    Social History   Socioeconomic History   Marital status: Married    Spouse name: Not on file   Number of children: Not on file   Years of education: Not on file   Highest education level: Not on file  Occupational History   Occupation: Housewife  Tobacco Use   Smoking status: Never   Smokeless tobacco: Never  Vaping Use   Vaping Use: Never used  Substance and Sexual Activity   Alcohol use: No     Alcohol/week: 0.0 standard drinks   Drug use: No   Sexual activity: Not Currently  Other Topics Concern   Not on file  Social History Narrative   Patient does not get regular exercise   Daily caffeine-one per day   Married 1958   Retired from [employer] Strain: Not on [employer] Insecurity: Not on file  Transportation Needs: Not on file  Physical Activity: Not on file  Stress: Not on file  Social Connections: Not on file    Tobacco Counseling Counseling given: Not Answered   Clinical Intake:                 Diabetic? No         Activities of Daily Living No flowsheet data found.  Patient Care Team: [name] Ghent, MD as PCP - General (Family Medicine)  Indicate any recent Medical Services you may have received from other than [facility] providers in the past year (date may be approximate).     Assessment:   This is a routine wellness examination for [name].  Hearing/Vision screen No results found.  Dietary issues and exercise activities discussed:     Goals Addressed   None    Depression Screen PHQ 2/9 Scores 11/01/2020 04/13/2020 09/05/2018 04/26/2017 02/24/2016 10/28/2014 09/17/2013  PHQ - 2 Score 0 0 1 0 0 0 0  PHQ- 9 Score - 0 1 - - - -    Fall Risk Fall Risk  11/01/2020 04/13/2020 09/05/2018 04/26/2017 02/24/2016  Falls in the past year? 0 1 Yes Yes No  Comment - - fell as result of foot getting tangled in a rope; bruising to both knees after falling on rocks pt fell on slippery deck; no injury or medical treatment -  Number falls in past yr: 0 0 1 1 -  Injury with Fall? 0 0 Yes No -  Risk for fall due to : - Medication side effect;Impaired balance/gait Impaired balance/gait;Impaired mobility - -  Follow up Falls evaluation completed Falls evaluation completed;Falls prevention discussed - - -    FALL RISK PREVENTION PERTAINING TO THE HOME:  Any stairs in or around the home? {YES/NO:21197} If  so, are there any without handrails? {YES/NO:21197} Home free of loose throw rugs in walkways, pet beds, electrical cords, etc? {YES/NO:21197} Adequate lighting in your home to reduce risk of falls? {YES/NO:21197}  ASSISTIVE DEVICES UTILIZED TO PREVENT FALLS:  Life alert? {YES/NO:21197} Use of a cane, walker or w/c? {YES/NO:21197} Grab bars in the bathroom? {YES/NO:21197} Shower chair or bench in shower? {YES/NO:21197} Elevated toilet seat or a handicapped toilet? {YES/NO:21197}  TIMED UP AND GO:  Was the test performed? No .    Cognitive Function: MMSE - Mini Mental State Exam 04/13/2020 09/05/2018 04/26/2017  Orientation to time 5 5 5  Orientation to Place 5 5 5   Registration 3 3 3   Attention/ Calculation 5 0 0  Recall 3 3 3   Language- name 2 objects - 0 0  Language- repeat 1 1 1   Language- follow 3 step command - 3 3  Language- read & follow direction - 0 0  Write a sentence - 0 0  Copy design - 0 0  Total score - 20 20        Immunizations Immunization History  Administered Date(s) Administered   Fluad Quad(high Dose 65+) 10/12/2021   Influenza Whole 10/07/2006, 10/22/2007, 09/20/2009, 09/20/2010   Influenza,inj,Quad PF,6+ Mos 08/27/2013, 10/28/2014, 11/07/2016, 12/11/2017, 09/05/2018   PFIZER(Purple Top)SARS-COV-2 Vaccination 12/28/2019, 01/18/2020, 10/26/2020   Pneumococcal Conjugate-13 02/24/2016   Pneumococcal Polysaccharide-23 08/23/1999, 05/26/2012   Td 07/16/2006, 03/15/2011   Tdap 07/30/2021   Zoster, Live 03/10/2009    TDAP status: Up to date  Flu Vaccine status: Up to date  Pneumococcal vaccine status: Up to date  {Covid-19 vaccine status:2101808}  Qualifies for Shingles Vaccine? Yes   Zostavax completed Yes   {Shingrix Completed?:2101804}  Screening Tests Health Maintenance  Topic Date Due   Zoster Vaccines- Shingrix (1 of 2) Never done   URINE MICROALBUMIN  03/14/2011   COVID-19 Vaccine (4 - Booster for [place] series) 12/21/2020    COLONOSCOPY (Pts 45-42yrs Insurance coverage will need to be confirmed)  04/13/2024 (Originally 05/30/2017)   TETANUS/TDAP  07/31/2031   Pneumonia Vaccine 54+ Years old  Completed   INFLUENZA VACCINE  Completed   DEXA SCAN  Completed   HPV [place] Maintenance Due  Topic Date Due   Zoster Vaccines- Shingrix (1 of 2) Never done   URINE MICROALBUMIN  03/14/2011   COVID-19 Vaccine (4 - Booster for [place] series) 12/21/2020    Colorectal cancer screening: No longer required.   {Mammogram status:21018020}  {Bone Density status:21018021}  Lung Cancer Screening: (Low Dose CT Chest recommended if Age 62-80 years, 30 pack-year currently smoking OR have quit w/in 15years.) does not qualify.     Additional Screening:  Hepatitis C Screening: does not qualify;  Vision Screening: Recommended annual ophthalmology exams for early detection of glaucoma and other disorders of the eye. Is the patient up to date with their annual eye exam?  {YES/NO:21197} Who is the provider or what is the name of the office in which the patient attends annual eye exams? *** If pt is not established with a provider, would they like to be referred to a provider to establish care? {YES/NO:21197}.   Dental Screening: Recommended annual dental exams for proper oral hygiene  Community Resource Referral / Chronic Care Management: CRR required this visit?  {YES/NO:21197}  CCM required this visit?  {YES/NO:21197}     Plan:     I have personally reviewed and noted the following in the patients chart:   Medical and social history Use of alcohol, tobacco or illicit drugs  Current medications and supplements including opioid prescriptions.  Functional ability and status Nutritional status Physical activity Advanced directives List of other physicians Hospitalizations, surgeries, and ER visits in previous 12 months Vitals Screenings to include cognitive, depression,  and falls Referrals and appointments  In addition, I have reviewed and discussed with patient certain preventive protocols, quality metrics, and best practice recommendations. A written personalized care plan for preventive services as well as general preventive health recommendations were provided to patient.   Due to this being a telephonic visit, the after  visit summary with patients personalized plan was offered to patient via mail or my-chart. ***Patient declined at this time./ Patient would like to access on my-chart/ per request, patient was mailed a copy of AVS./ Patient preferred to pick up at office at next visit.   Loma Messing, LPN   08/11/9449   Nurse Health Advisor  Nurse Notes: none

## 2021-12-20 NOTE — Progress Notes (Signed)
TAYJA, MANZER ([id number]) Visit Report for 12/20/2021 Chief Complaint Document Details Patient Name: [name], [name] Date of Service: 12/20/2021 3:00 PM Medical Record Number: [id number] Patient Account Number: [id number] Date of Birth/Sex: 26-May-1941 (80 y.o. F) Treating RN: Levora Dredge Primary Care Provider: [name] Other Clinician: Referring Provider: [name] Treating Provider/Extender: Yaakov Guthrie in Treatment: 15 Information Obtained from: Patient Chief Complaint Left LE Ulcer Electronic Signature(s) Signed: 12/20/2021 4:50:34 PM By: Kalman Shan DO Entered By: Kalman Shan on 12/20/2021 16:46:25 [name], [name] ([id number]) -------------------------------------------------------------------------------- HPI Details Patient Name: [name], [name] Date of Service: 12/20/2021 3:00 PM Medical Record Number: [id number] Patient Account Number: [id number] Date of Birth/Sex: 1941-02-27 (80 y.o. F) Treating RN: Levora Dredge Primary Care Provider: [name] Other Clinician: Referring Provider: [name] Treating Provider/Extender: Yaakov Guthrie in Treatment: 15 History of Present Illness HPI Description: 08/31/2021 upon evaluation today patient presents for initial inspection here in our clinic concerning issues that she is having with a wound on her left anterior lower extremity. This is actually an area that she unfortunately tells me she was picking up a cage and dropped hitting the anterior portion of her shin causing a skin tear. She did not go to the hospital due to the fact that she did not realize that she would need to. This happened on July 30, 2021. With that being said the patient has been on Augmentin she has about 1 and half days of this left. Nonetheless she tells me that she does have a longstanding history of leg swelling. She does have chronic venous insufficiency as well it appears to me. She  also has some evidence of may be early stages of lymphedema. She has a history of fibromyalgia, bilateral knee osteoarthritis, and appears to have a fungal infection of the right ankle region. In regard to the arthritis she tells me her legs tend to swell less after she gets her knee injections allowing her to be able to walk and get around much more effectively. Following the injections she does well for a while and then when that seems to be wearing off she tends to start having issues with more swelling because she is more mobile that makes sense from a venous standpoint. 10/10; I was asked to see this patient today who came in for a nurse visit. Firstly she has developed a widespread very pruritic rash involving her abdomen and back. Lesser extent on her feet. The second problem is that she is complaining about the tightness of the wound and wrap on the left leg where the wound is. 09/14/2021 upon evaluation today patient appears to be doing decently well in regard to her leg ulcer. The biggest issue she is having is a significant issue with a rash currently. I do not see any signs of active infection systemically which is great news although with regard to the rash she did see dermatology they told her she had eczema that does not seem to sit right with me out think that is mainly the issues she has going on. I do think she may benefit from the use of a short course of prednisone which should help with the rash which is absolutely driving her crazy. 09/21/2021 upon evaluation today patient appears to be doing decently well in regard to her wound this is still pretty deep. The prednisone has helped with the rash although is not completely done and it is doing much better than what it was. Fortunately there is no signs  of active infection systemically at this time also great news. 09/28/2021 upon evaluation today patient's wound on her leg actually showing some signs of improvement which is good  news I am happy in that regard. Fortunately there does not appear to be any evidence of active infection systemically which is great news. Unfortunately she is still continuing to have issues with her rash which is almost worse than the actual wound itself for her at this point. 10/05/2021 upon evaluation today patient appears to be doing well with regard to her leg ulcer. She is tolerating the dressing changes without complication and overall I am extremely pleased with where we stand. With that being said I do think that she could possibly benefit from a snap VAC if we can gain approval I am not certain if this will be improved or not. Nonetheless I definitely think we can give this a try and if we get approval I think this will help speed up the healing process to be honest. In the interim I do believe the [facility] is doing decently well. 10/10/2021 upon evaluation today patient appears to be doing well with regard to her wound although is not significantly smaller. We still have quite a bit of depth here. Fortunately there is no signs of active infection systemically at this time. No fevers, chills, nausea, vomiting, or diarrhea. 10/17/2021 upon evaluation today patient appears to be doing poorly in regard to her leg compared to last time there is a lot of irritation today. She actually kept the Zetuvit on all week which does not seem to have been beneficial for her. She was supposed to change it at home we did order her supplies but she did not even find the supplies on her porch until she tells me yesterday. Either way we had already decided that the best thing was probably can to be to have her come in for nurse visits. I think that still probably the best thing to do I feel like there is a bit of dementia here that I was not aware of last week or even the previous visit to be honest that is going to prohibit her from being able to make these dressing changes herself. 10/27/2021 upon  evaluation today patient appears to be doing well with regard to her wound currently. She is actually showing signs of excellent improvement and I do feel like the collagen is doing a great job. I am extremely pleased with where we stand today. 11/03/2021 upon evaluation patient's wound bed actually showed signs of good granulation epithelization at this point. I am actually extremely pleased with where we stand and how this is progressing I think the patient is making great progress. She is very happy with today's findings. 12/27. This is a patient with wounds on her left lateral lower extremity. She has 2 open wounds. She did not tolerate 3 layer compression we have therefore been using Tubigrip. We use dermacol last week but we have endoform to use this week 12/04/2021 upon evaluation today patient appears to be doing well with regard to her wound. There is still some irritation around where she has a small area that she bumped inferior to where the original wound was. This is not appearing to be too significant at this point which is good news. 12/07/2020 upon evaluation today patient appears to be doing well with regard to her wounds actually look much better even than earlier this week when I saw her. They are measuring smaller. Unfortunately  she does appear to have some cellulitis of the leg in general. There is really nothing significant that I can culture here so I am just can have to treat prophylactically and see if we can get this under control. The patient voiced understanding and does realize what is going on as well as we discussed that today. 1/25; patient presents for follow-up. She reports finishing her course of antibiotics. She reports improvement in her symptoms. She is only able to tolerate the compression wrap for a few days before she takes this off herself. It is unclear if she is dressing the wound when she takes the compression wraps off. She is scheduled for her venous reflux  studies tomorrow. She currently denies signs of infection. CALEYAH, JR ([id number]) Electronic Signature(s) Signed: 12/20/2021 4:50:34 PM By: Kalman Shan DO Entered By: Kalman Shan on 12/20/2021 16:47:10 [name], [name] ([id number]) -------------------------------------------------------------------------------- Physical Exam Details Patient Name: Saksa, Christinamarie L. Date of Service: 12/20/2021 3:00 PM Medical Record Number: [id number] Patient Account Number: [id number] Date of Birth/Sex: May 10, 1941 (80 y.o. F) Treating RN: Levora Dredge Primary Care Provider: [name] Other Clinician: Referring Provider: [name] Treating Provider/Extender: Yaakov Guthrie in Treatment: 15 Constitutional . Cardiovascular . Psychiatric . Notes Left lower extremity: 2 open wounds with granulation tissue and scant nonviable tissue easily removed with mechanical debridement. No signs of surrounding infection. Poor edema control, 2+ pitting edema to the knee. Electronic Signature(s) Signed: 12/20/2021 4:50:34 PM By: Kalman Shan DO Entered By: Kalman Shan on 12/20/2021 16:48:00 [place]jai, [name] ([id number]) -------------------------------------------------------------------------------- Physician Orders Details Patient Name: Heidler, Libbey L. Date of Service: 12/20/2021 3:00 PM Medical Record Number: [id number] Patient Account Number: [id number] Date of Birth/Sex: Sep 06, 1941 (80 y.o. F) Treating RN: Levora Dredge Primary Care Provider: [name] Other Clinician: Referring Provider: [name] Treating Provider/Extender: Yaakov Guthrie in Treatment: 15 Verbal / Phone Orders: No Diagnosis Coding Follow-up Appointments o Return Appointment in 1 week. - once a week provider o Nurse Visit as needed - once per week Bathing/ Shower/ Hygiene o May shower with wound dressing protected with water repellent cover or cast  protector. o No tub bath. Anesthetic (Use 'Patient Medications' Section for Anesthetic Order Entry) o Lidocaine applied to wound bed Edema Control - Lymphedema / Segmental Compressive Device / Other o Patient to wear own compression stockings. Remove compression stockings every night before going to bed and put on every morning when getting up. - right leg o Elevate, Exercise Daily and Avoid Standing for Long Periods of Time. o Elevate legs to the level of the heart and pump ankles as often as possible o Elevate leg(s) parallel to the floor when sitting. o DO YOUR BEST to sleep in the bed at night. DO NOT sleep in your recliner. Long hours of sitting in a recliner leads to swelling of the legs and/or potential wounds on your backside. Additional Orders / Instructions o Follow Nutritious Diet and Increase Protein Intake Wound Treatment Wound #1 - Lower Leg Wound Laterality: Left, Anterior Cleanser: Soap and Water 2 x Per Week/30 Days Discharge Instructions: Gently cleanse wound with antibacterial soap, rinse and pat dry prior to dressing wounds Topical: Gentamicin 2 x Per Week/30 Days Discharge Instructions: Apply as directed by provider. in office only Topical: Triamcinolone Acetonide Cream, 0.1%, 15 (g) tube 2 x Per Week/30 Days Discharge Instructions: Apply as directed by provider. Primary Dressing: Silvercel Small 2x2 (in/in) 2 x Per Week/30 Days Discharge Instructions: Apply Silvercel Small 2x2 (in/in) as instructed Secondary  Dressing: Zetuvit Plus Silicone Non-bordered 5x5 (in/in) 2 x Per Week/30 Days Secured With: Coban Cohesive Bandage 4x5 (yds) Stretched 2 x Per Week/30 Days Discharge Instructions: Apply Coban as directed. Secured With: [employer] or Non-Sterile 6-ply 4.5x4 (yd/yd) 2 x Per Week/30 Days Discharge Instructions: Apply Kerlix as directed Wound #2 - Lower Leg Wound Laterality: Left, Distal Cleanser: Soap and Water 2 x Per Week/30  Days Discharge Instructions: Gently cleanse wound with antibacterial soap, rinse and pat dry prior to dressing wounds Topical: Gentamicin 2 x Per Week/30 Days Discharge Instructions: Apply as directed by provider. in office only Topical: Triamcinolone Acetonide Cream, 0.1%, 15 (g) tube 2 x Per Week/30 Days Discharge Instructions: Apply as directed by provider. MELIYA, MCCONAHY ([id number]) Primary Dressing: Silvercel Small 2x2 (in/in) 2 x Per Week/30 Days Discharge Instructions: Apply Silvercel Small 2x2 (in/in) as instructed Secondary Dressing: Zetuvit Plus Silicone Non-bordered 5x5 (in/in) 2 x Per Week/30 Days Secured With: Coban Cohesive Bandage 4x5 (yds) Stretched 2 x Per Week/30 Days Discharge Instructions: Apply Coban as directed. Secured With: [employer] or Non-Sterile 6-ply 4.5x4 (yd/yd) 2 x Per Week/30 Days Discharge Instructions: Apply Kerlix as directed Electronic Signature(s) Signed: 12/20/2021 4:50:34 PM By: Kalman Shan DO Entered By: Kalman Shan on 12/20/2021 16:50:07 [name], [name] ([id number]) -------------------------------------------------------------------------------- Problem List Details Patient Name: [name], [name] Date of Service: 12/20/2021 3:00 PM Medical Record Number: [id number] Patient Account Number: [id number] Date of Birth/Sex: [birth year] (80 y.o. F) Treating RN: Levora Dredge Primary Care Provider: [name] Other Clinician: Referring Provider: [name] Treating Provider/Extender: Yaakov Guthrie in Treatment: 15 Active Problems ICD-10 Encounter Code Description Active Date MDM Diagnosis I87.2 Venous insufficiency (chronic) (peripheral) 08/31/2021 No Yes L97.822 Non-pressure chronic ulcer of other part of left lower leg with fat layer 08/31/2021 No Yes exposed M79.7 Fibromyalgia 08/31/2021 No Yes M17.0 Bilateral primary osteoarthritis of knee 08/31/2021 No Yes B35.4 Tinea corporis 08/31/2021 No Yes R21  Rash and other nonspecific skin eruption 09/04/2021 No Yes Inactive Problems Resolved Problems Electronic Signature(s) Signed: 12/20/2021 4:50:34 PM By: Kalman Shan DO Entered By: Kalman Shan on 12/20/2021 16:46:20 [name], [name] ([id number]) -------------------------------------------------------------------------------- Progress Note Details Patient Name: [name], [name] Date of Service: 12/20/2021 3:00 PM Medical Record Number: [id number] Patient Account Number: [id number] Date of Birth/Sex: January 05, 1941 (80 y.o. F) Treating RN: Levora Dredge Primary Care Provider: [name] Other Clinician: Referring Provider: [name] Treating Provider/Extender: Yaakov Guthrie in Treatment: 15 Subjective Chief Complaint Information obtained from Patient Left LE Ulcer History of Present Illness (HPI) 08/31/2021 upon evaluation today patient presents for initial inspection here in our clinic concerning issues that she is having with a wound on her left anterior lower extremity. This is actually an area that she unfortunately tells me she was picking up a cage and dropped hitting the anterior portion of her shin causing a skin tear. She did not go to the hospital due to the fact that she did not realize that she would need to. This happened on July 30, 2021. With that being said the patient has been on Augmentin she has about 1 and half days of this left. Nonetheless she tells me that she does have a longstanding history of leg swelling. She does have chronic venous insufficiency as well it appears to me. She also has some evidence of may be early stages of lymphedema. She has a history of fibromyalgia, bilateral knee osteoarthritis, and appears to have a fungal infection of the right ankle region. In regard  to the arthritis she tells me her legs tend to swell less after she gets her knee injections allowing her to be able to walk and get around much more  effectively. Following the injections she does well for a while and then when that seems to be wearing off she tends to start having issues with more swelling because she is more mobile that makes sense from a venous standpoint. 10/10; I was asked to see this patient today who came in for a nurse visit. Firstly she has developed a widespread very pruritic rash involving her abdomen and back. Lesser extent on her feet. The second problem is that she is complaining about the tightness of the wound and wrap on the left leg where the wound is. 09/14/2021 upon evaluation today patient appears to be doing decently well in regard to her leg ulcer. The biggest issue she is having is a significant issue with a rash currently. I do not see any signs of active infection systemically which is great news although with regard to the rash she did see dermatology they told her she had eczema that does not seem to sit right with me out think that is mainly the issues she has going on. I do think she may benefit from the use of a short course of prednisone which should help with the rash which is absolutely driving her crazy. 09/21/2021 upon evaluation today patient appears to be doing decently well in regard to her wound this is still pretty deep. The prednisone has helped with the rash although is not completely done and it is doing much better than what it was. Fortunately there is no signs of active infection systemically at this time also great news. 09/28/2021 upon evaluation today patient's wound on her leg actually showing some signs of improvement which is good news I am happy in that regard. Fortunately there does not appear to be any evidence of active infection systemically which is great news. Unfortunately she is still continuing to have issues with her rash which is almost worse than the actual wound itself for her at this point. 10/05/2021 upon evaluation today patient appears to be doing well with  regard to her leg ulcer. She is tolerating the dressing changes without complication and overall I am extremely pleased with where we stand. With that being said I do think that she could possibly benefit from a snap VAC if we can gain approval I am not certain if this will be improved or not. Nonetheless I definitely think we can give this a try and if we get approval I think this will help speed up the healing process to be honest. In the interim I do believe the [facility] is doing decently well. 10/10/2021 upon evaluation today patient appears to be doing well with regard to her wound although is not significantly smaller. We still have quite a bit of depth here. Fortunately there is no signs of active infection systemically at this time. No fevers, chills, nausea, vomiting, or diarrhea. 10/17/2021 upon evaluation today patient appears to be doing poorly in regard to her leg compared to last time there is a lot of irritation today. She actually kept the Zetuvit on all week which does not seem to have been beneficial for her. She was supposed to change it at home we did order her supplies but she did not even find the supplies on her porch until she tells me yesterday. Either way we had already decided that the  best thing was probably can to be to have her come in for nurse visits. I think that still probably the best thing to do I feel like there is a bit of dementia here that I was not aware of last week or even the previous visit to be honest that is going to prohibit her from being able to make these dressing changes herself. 10/27/2021 upon evaluation today patient appears to be doing well with regard to her wound currently. She is actually showing signs of excellent improvement and I do feel like the collagen is doing a great job. I am extremely pleased with where we stand today. 11/03/2021 upon evaluation patient's wound bed actually showed signs of good granulation epithelization at this  point. I am actually extremely pleased with where we stand and how this is progressing I think the patient is making great progress. She is very happy with today's findings. 12/27. This is a patient with wounds on her left lateral lower extremity. She has 2 open wounds. She did not tolerate 3 layer compression we have therefore been using Tubigrip. We use dermacol last week but we have endoform to use this week 12/04/2021 upon evaluation today patient appears to be doing well with regard to her wound. There is still some irritation around where she has a small area that she bumped inferior to where the original wound was. This is not appearing to be too significant at this point which is good news. 12/07/2020 upon evaluation today patient appears to be doing well with regard to her wounds actually look much better even than earlier this week when I saw her. They are measuring smaller. Unfortunately she does appear to have some cellulitis of the leg in general. There is really nothing significant that I can culture here so I am just can have to treat prophylactically and see if we can get this under control. The patient voiced understanding and does realize what is going on as well as we discussed that today. 1/25; patient presents for follow-up. She reports finishing her course of antibiotics. She reports improvement in her symptoms. She is only able to tolerate the compression wrap for a few days before she takes this off herself. It is unclear if she is dressing the wound when she takes the Windhorst, Shametra L. ([id number]) compression wraps off. She is scheduled for her venous reflux studies tomorrow. She currently denies signs of infection. Objective Constitutional Vitals Time Taken: 3:14 PM, Height: 63 in, Weight: 198 lbs, BMI: 35.1, Temperature: 97.5 F, Pulse: 90 bpm, Respiratory Rate: 18 breaths/min, Blood Pressure: 145/91 mmHg. General Notes: Left lower extremity: 2 open wounds with  granulation tissue and scant nonviable tissue easily removed with mechanical debridement. No signs of surrounding infection. Poor edema control, 2+ pitting edema to the knee. Integumentary (Hair, Skin) Wound #1 status is Open. Original cause of wound was Skin Tear/Laceration. The date acquired was: 07/30/2021. The wound has been in treatment 15 weeks. The wound is located on the Left,Anterior Lower Leg. The wound measures 1.6cm length x 0.6cm width x 0.3cm depth; 0.754cm^2 area and 0.226cm^3 volume. There is Fat Layer (Subcutaneous Tissue) exposed. There is no tunneling or undermining noted. There is a medium amount of serosanguineous drainage noted. The wound margin is flat and intact. There is small (1-33%) red granulation within the wound bed. There is a medium (34-66%) amount of necrotic tissue within the wound bed including Adherent Slough. Wound #2 status is Open. Original cause of wound was Trauma.  The date acquired was: 12/04/2021. The wound has been in treatment 1 weeks. The wound is located on the Left,Distal Lower Leg. The wound measures 0.4cm length x 0.4cm width x 0.1cm depth; 0.126cm^2 area and 0.013cm^3 volume. There is no tunneling or undermining noted. There is a medium amount of serosanguineous drainage noted. There is no granulation within the wound bed. There is a large (67-100%) amount of necrotic tissue within the wound bed including Eschar. Assessment Active Problems ICD-10 Venous insufficiency (chronic) (peripheral) Non-pressure chronic ulcer of other part of left lower leg with fat layer exposed Fibromyalgia Bilateral primary osteoarthritis of knee Tinea corporis Rash and other nonspecific skin eruption Patient's wounds are stable. No signs of infection on exam. I recommended continuing with silver alginate under Kerlix/Coban. We will add gentamicin ointment to address any bioburden. She is having her Venous reflux studies done tomorrow. Follow-up in 1 week. She knows to  call with any questions or concerns and can be seen sooner. She may have to do a nurse visit to get rewrapped after her reflux studies and she knows to call for this. Plan Follow-up Appointments: Return Appointment in 1 week. - once a week provider Nurse Visit as needed - once per week Bathing/ Shower/ Hygiene: May shower with wound dressing protected with water repellent cover or cast protector. No tub bath. Anesthetic (Use 'Patient Medications' Section for Anesthetic Order Entry): Lidocaine applied to wound bed Edema Control - Lymphedema / Segmental Compressive Device / Other: Patient to wear own compression stockings. Remove compression stockings every night before going to bed and put on every morning when getting up. - right leg Elevate, Exercise Daily and Avoid Standing for Long Periods of Time. Elevate legs to the level of the heart and pump ankles as often as possible Elevate leg(s) parallel to the floor when sitting. [name], [name] ([id number]) DO YOUR BEST to sleep in the bed at night. DO NOT sleep in your recliner. Long hours of sitting in a recliner leads to swelling of the legs and/or potential wounds on your backside. Additional Orders / Instructions: Follow Nutritious Diet and Increase Protein Intake WOUND #1: - Lower Leg Wound Laterality: Left, Anterior Cleanser: Soap and Water 2 x Per Week/30 Days Discharge Instructions: Gently cleanse wound with antibacterial soap, rinse and pat dry prior to dressing wounds Topical: Gentamicin 2 x Per Week/30 Days Discharge Instructions: Apply as directed by provider. in office only Topical: Triamcinolone Acetonide Cream, 0.1%, 15 (g) tube 2 x Per Week/30 Days Discharge Instructions: Apply as directed by provider. Primary Dressing: Silvercel Small 2x2 (in/in) 2 x Per Week/30 Days Discharge Instructions: Apply Silvercel Small 2x2 (in/in) as instructed Secondary Dressing: Zetuvit Plus Silicone Non-bordered 5x5 (in/in) 2 x Per  Week/30 Days Secured With: Coban Cohesive Bandage 4x5 (yds) Stretched 2 x Per Week/30 Days Discharge Instructions: Apply Coban as directed. Secured With: [employer] or Non-Sterile 6-ply 4.5x4 (yd/yd) 2 x Per Week/30 Days Discharge Instructions: Apply Kerlix as directed WOUND #2: - Lower Leg Wound Laterality: Left, Distal Cleanser: Soap and Water 2 x Per Week/30 Days Discharge Instructions: Gently cleanse wound with antibacterial soap, rinse and pat dry prior to dressing wounds Topical: Gentamicin 2 x Per Week/30 Days Discharge Instructions: Apply as directed by provider. in office only Topical: Triamcinolone Acetonide Cream, 0.1%, 15 (g) tube 2 x Per Week/30 Days Discharge Instructions: Apply as directed by provider. Primary Dressing: Silvercel Small 2x2 (in/in) 2 x Per Week/30 Days Discharge Instructions: Apply Silvercel Small 2x2 (in/in) as instructed  Secondary Dressing: Zetuvit Plus Silicone Non-bordered 5x5 (in/in) 2 x Per Week/30 Days Secured With: Coban Cohesive Bandage 4x5 (yds) Stretched 2 x Per Week/30 Days Discharge Instructions: Apply Coban as directed. Secured With: [employer] or Non-Sterile 6-ply 4.5x4 (yd/yd) 2 x Per Week/30 Days Discharge Instructions: Apply Kerlix as directed 1. Gentamicin with silver alginate under Kerlix/Coban 2. Follow-up in 1 week Electronic Signature(s) Signed: 12/20/2021 4:50:34 PM By: Kalman Shan DO Entered By: Kalman Shan on 12/20/2021 16:49:31 [name], [name] ([id number]) -------------------------------------------------------------------------------- SuperBill Details Patient Name: [name], [name] Date of Service: 12/20/2021 Medical Record Number: [id number] Patient Account Number: [id number] Date of Birth/Sex: [birth year] (81 y.o. 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 B35.4 Tinea corporis R21 Rash and other nonspecific skin eruption Facility Procedures CPT4 Code: 60045997 Description: 74142 - WOUND CARE VISIT-LEV 3 EST PT Modifier: Quantity: 1 Physician Procedures CPT4 Code: 3953202 Description: 33435 - WC PHYS LEVEL 3 - EST PT Modifier: Quantity: 1 CPT4 Code: Description: ICD-10 Diagnosis Description L97.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

## 2021-12-20 NOTE — Progress Notes (Signed)
X- 2 5 Complex Wound Measurement - multiple wounds INTERVENTIONS - Wound Dressings '[]'  - Small Wound Dressing one or multiple wounds 0 X- 2 15 Medium Wound Dressing one or multiple wounds '[]'  - 0 Large Wound Dressing one or multiple wounds X- 1 5 Application of Medications - topical '[]'  - 0 Application of Medications - injection INTERVENTIONS - Miscellaneous '[]'  - External ear exam 0 '[]'  - 0 Specimen Collection (cultures, biopsies, blood, body fluids, etc.) '[]'  - 0 Specimen(s) / Culture(s) sent or taken to Lab for analysis '[]'  - 0 Patient Transfer (multiple staff / Civil Service fast streamer / Similar devices) X- 1 5 Simple Staple / Suture removal (25 or less) '[]'  - 0 Complex Staple / Suture removal (26 or more) '[]'  - 0 Hypo / Hyperglycemic Management (close monitor of Blood Glucose) '[]'  - 0 Ankle / Brachial Index (ABI) - do not check if billed separately '[]'  - 0 Vital Signs Has the patient been seen at the hospital within the last three years: Yes Total Score: 95 Level Of Care: New/Established - Level 3 Electronic Signature(s) Signed: 12/20/2021 4:52:49 PM By: Levora Dredge Entered By: Levora Dredge on 12/20/2021 16:47:50 Goldthwaite, [name] ([id number]) -------------------------------------------------------------------------------- Encounter Discharge Information Details Patient Name: [name], [name] Date of Service: 12/20/2021 3:00 PM Medical Record Number: [id number] Patient Account Number: [id number] Date of Birth/Sex: March 19, 1941 (81 y.o. F) Treating RN: Levora Dredge Primary Care Devaney Segers: [name] Other Clinician: Referring Brigg Cape: [name] Treating Dejan Angert/Extender: Yaakov Guthrie in Treatment: 15 Encounter Discharge Information Items Discharge Condition:  Stable Ambulatory Status: Walker Discharge Destination: Home Transportation: Private Auto Accompanied By: self Schedule Follow-up Appointment: Yes Clinical Summary of Care: Notes pt advised to call if wrap removed at [place] appointment, pt stated understanding. Electronic Signature(s) Signed: 12/20/2021 4:49:16 PM By: Levora Dredge Entered By: Levora Dredge on 12/20/2021 16:49:15 Foote, [name] ([id number]) -------------------------------------------------------------------------------- Lower Extremity Assessment Details Patient Name: [name], [name] Date of Service: 12/20/2021 3:00 PM Medical Record Number: [id number] Patient Account Number: [id number] Date of Birth/Sex: September 10, 1941 (81 y.o. F) Treating RN: Levora Dredge Primary Care Murriel Holwerda: [name] Other Clinician: Referring Kati Riggenbach: [name] Treating Helder Crisafulli/Extender: Yaakov Guthrie in Treatment: 15 Edema Assessment Assessed: [name]: No] [name]: No] Edema: [Left: Ye] [Right: s] Calf Left: Right: Point of Measurement: 31 cm From Medial Instep 44.6 cm Ankle Left: Right: Point of Measurement: 11 cm From Medial Instep 29.5 cm Vascular Assessment Pulses: Dorsalis Pedis Palpable: [Left:Yes] Posterior Tibial Palpable: [Left:Yes] Electronic Signature(s) Signed: 12/20/2021 4:52:49 PM By: Levora Dredge Entered By: Levora Dredge on 12/20/2021 15:28:37 [name], [name] ([id number]) -------------------------------------------------------------------------------- Multi Wound Chart Details Patient Name: [name], [name] Date of Service: 12/20/2021 3:00 PM Medical Record Number: [id number] Patient Account Number: [id number] Date of Birth/Sex: 1941-03-21 (81 y.o. F) Treating RN: Levora Dredge Primary Care Montavis Schubring: [name] Other Clinician: Referring Jshawn Hurta: [name] Treating Arya Luttrull/Extender: Yaakov Guthrie in Treatment: 15 Vital Signs Height(in):  63 Pulse(bpm): 90 Weight(lbs): 198 Blood Pressure(mmHg): 145/91 Body Mass Index(BMI): 35.1 Temperature(F): 97.5 Respiratory Rate(breaths/min): 18 Photos: [N/A:N/A] Wound Location: Left, Anterior Lower Leg Left, Distal Lower Leg N/A Wounding Event: Skin Tear/Laceration Trauma N/A Primary Etiology: Venous Leg Ulcer Venous Leg Ulcer N/A Comorbid History: Cataracts, Osteoarthritis, Received Cataracts, Osteoarthritis, Received N/A Radiation Radiation Date Acquired: 07/30/2021 12/04/2021 N/A Weeks of Treatment: 15 1 N/A Wound Status: Open Open N/A Wound Recurrence: No No N/A Measurements L x W x D (cm) 1.6x0.6x0.3 0.4x0.4x0.1 N/A Area (cm) : 0.754 0.126 N/A Volume (cm) : 0.226 0.013 N/A %  [name] ([id number]) -------------------------------------------------------------------------------- Wound Assessment Details Patient Name: [name], [name] Date of Service: 12/20/2021 3:00 PM Medical Record Number: [id number] Patient Account Number: [id number] Date of Birth/Sex: 1940-11-28 (81 y.o. F) Treating  RN: Levora Dredge Primary Care Jing Howatt: [name] Other Clinician: Referring Sandrea Boer: [name] Treating Blas Riches/Extender: Yaakov Guthrie in Treatment: 15 Wound Status Wound Number: 1 Primary Etiology: Venous Leg Ulcer Wound Location: Left, Anterior Lower Leg Wound Status: Open Wounding Event: Skin Tear/Laceration Comorbid History: Cataracts, Osteoarthritis, Received Radiation Date Acquired: 07/30/2021 Weeks Of Treatment: 15 Clustered Wound: No Photos Wound Measurements Length: (cm) 1.6 Width: (cm) 0.6 Depth: (cm) 0.3 Area: (cm) 0.754 Volume: (cm) 0.226 % Reduction in Area: 40% % Reduction in Volume: 64% Epithelialization: Small (1-33%) Tunneling: No Undermining: No Wound Description Classification: Full Thickness Without Exposed Support Structures Wound Margin: Flat and Intact Exudate Amount: Medium Exudate Type: Serosanguineous Exudate Color: red, brown Foul Odor After Cleansing: No Slough/Fibrino Yes Wound Bed Granulation Amount: Small (1-33%) Exposed Structure Granulation Quality: Red Fascia Exposed: No Necrotic Amount: Medium (34-66%) Fat Layer (Subcutaneous Tissue) Exposed: Yes Necrotic Quality: Adherent Slough Tendon Exposed: No Muscle Exposed: No Joint Exposed: No Bone Exposed: No Treatment Notes Wound #1 (Lower Leg) Wound Laterality: Left, Anterior Cleanser Soap and Water Discharge Instruction: Gently cleanse wound with antibacterial soap, rinse and pat dry prior to dressing wounds Peri-Wound Care Liebig, Lyly L. ([id number]) Topical Gentamicin Discharge Instruction: Apply as directed by Reya Aurich. in office only Triamcinolone Acetonide Cream, 0.1%, 15 (g) tube Discharge Instruction: Apply as directed by Ola Raap. Primary Dressing Silvercel Small 2x2 (in/in) Discharge Instruction: Apply Silvercel Small 2x2 (in/in) as instructed Secondary Dressing Zetuvit Plus Silicone Non-bordered 5x5 (in/in) Secured With Coban Cohesive  Bandage 4x5 (yds) Stretched Discharge Instruction: Apply Coban as directed. Kerlix Roll Sterile or Non-Sterile 6-ply 4.5x4 (yd/yd) Discharge Instruction: Apply Kerlix as directed Compression Wrap Compression Stockings Add-Ons Electronic Signature(s) Signed: 12/20/2021 4:52:49 PM By: Levora Dredge Entered By: Levora Dredge on 12/20/2021 15:26:53 [name], [name] ([id number]) -------------------------------------------------------------------------------- Wound Assessment Details Patient Name: [name], [name] Date of Service: 12/20/2021 3:00 PM Medical Record Number: [id number] Patient Account Number: [id number] Date of Birth/Sex: 07/01/1941 (81 y.o. F) Treating RN: Levora Dredge Primary Care Elija Mccamish: [name] Other Clinician: Referring Shaundra Fullam: [name] Treating Dalphine Cowie/Extender: Yaakov Guthrie in Treatment: 15 Wound Status Wound Number: 2 Primary Etiology: Venous Leg Ulcer Wound Location: Left, Distal Lower Leg Wound Status: Open Wounding Event: Trauma Comorbid History: Cataracts, Osteoarthritis, Received Radiation Date Acquired: 12/04/2021 Weeks Of Treatment: 1 Clustered Wound: No Photos Wound Measurements Length: (cm) 0.4 Width: (cm) 0.4 Depth: (cm) 0.1 Area: (cm) 0.126 Volume: (cm) 0.013 % Reduction in Area: 67.9% % Reduction in Volume: 66.7% Epithelialization: None Tunneling: No Undermining: No Wound Description Classification: Full Thickness Without Exposed Support Structures Exudate Amount: Medium Exudate Type: Serosanguineous Exudate Color: red, brown Foul Odor After Cleansing: No Slough/Fibrino Yes Wound Bed Granulation Amount: None Present (0%) Exposed Structure Necrotic Amount: Large (67-100%) Fascia Exposed: No Necrotic Quality: Eschar Fat Layer (Subcutaneous Tissue) Exposed: No Tendon Exposed: No Muscle Exposed: No Joint Exposed: No Bone Exposed: No Treatment Notes Wound #2 (Lower Leg) Wound Laterality: Left,  Distal Cleanser Soap and Water Discharge Instruction: Gently cleanse wound with antibacterial soap, rinse and pat dry prior to dressing wounds Peri-Wound Care [name], [name] ([id number]) Topical Gentamicin Discharge Instruction: Apply as directed by Javarri Segal. in office only Triamcinolone Acetonide Cream, 0.1%, 15 (g) tube Discharge Instruction: Apply as directed by Shambria Camerer. Primary Dressing Silvercel Small 2x2 (in/in) Discharge Instruction:  Reduction in Area: 40.00% 67.90% N/A % Reduction in Volume: 64.00% 66.70% N/A Classification: Full Thickness Without Exposed Full Thickness Without Exposed N/A Support Structures Support Structures Exudate Amount: Medium Medium N/A Exudate Type: Serosanguineous Serosanguineous N/A Exudate Color: red, brown red, brown N/A Wound Margin: Flat and Intact N/A N/A Granulation Amount: Small (1-33%) None Present (0%) N/A Granulation Quality: Red N/A N/A Necrotic Amount: Medium (34-66%) Large (67-100%) N/A Necrotic Tissue: Adherent Slough Eschar N/A Exposed Structures: Fat Layer (Subcutaneous Tissue): Fascia: No N/A Yes Fat Layer (Subcutaneous Tissue): Fascia: No No Tendon: No Tendon: No Muscle: No Muscle: No Joint: No Joint: No Bone: No Bone: No Epithelialization: Small (1-33%) None N/A Treatment Notes Electronic Signature(s) Signed: 12/20/2021 4:52:49 PM By: Levora Dredge Entered By: Levora Dredge on 12/20/2021 16:13:28 Vanmeter, [name] ([id number]) U[place]ah, [name] ([id number]) -------------------------------------------------------------------------------- Multi-Disciplinary Care Plan Details Patient Name: [name], [name] Date of Service: 12/20/2021 3:00 PM Medical Record  Number: [id number] Patient Account Number: [id number] Date of Birth/Sex: 04-28-1941 (81 y.o. F) Treating RN: Levora Dredge Primary Care Takumi Din: [name] Other Clinician: Referring Ellysia Char: [name] Treating Taijuan Serviss/Extender: Yaakov Guthrie in Treatment: 15 Active Inactive Wound/Skin Impairment Nursing Diagnoses: Impaired tissue integrity Goals: Patient/caregiver will verbalize understanding of skin care regimen Date Initiated: 08/31/2021 Date Inactivated: 09/21/2021 Target Resolution Date: 08/31/2021 Goal Status: Met Ulcer/skin breakdown will have a volume reduction of 30% by week 4 Date Initiated: 08/31/2021 Date Inactivated: 10/17/2021 Target Resolution Date: 10/01/2021 Goal Status: Unmet Unmet Reason: comorbities Ulcer/skin breakdown will have a volume reduction of 50% by week 8 Date Initiated: 08/31/2021 Date Inactivated: 12/04/2021 Target Resolution Date: 10/31/2021 Goal Status: Unmet Unmet Reason: comorbities Ulcer/skin breakdown will have a volume reduction of 80% by week 12 Date Initiated: 08/31/2021 Date Inactivated: 12/04/2021 Target Resolution Date: 12/01/2021 Goal Status: Unmet Unmet Reason: comorbities Ulcer/skin breakdown will heal within 14 weeks Date Initiated: 08/31/2021 Target Resolution Date: 01/01/2022 Goal Status: Active Interventions: Assess patient/caregiver ability to obtain necessary supplies Assess patient/caregiver ability to perform ulcer/skin care regimen upon admission and as needed Assess ulceration(s) every visit Provide education on ulcer and skin care Treatment Activities: Referred to DME Britton Bera for dressing supplies : 08/31/2021 Skin care regimen initiated : 08/31/2021 Notes: Electronic Signature(s) Signed: 12/20/2021 4:52:49 PM By: Levora Dredge Entered By: Levora Dredge on 12/20/2021 16:13:17 Wauters, [name] ([id number]) -------------------------------------------------------------------------------- Pain  Assessment Details Patient Name: [name], [name] Date of Service: 12/20/2021 3:00 PM Medical Record Number: [id number] Patient Account Number: [id number] Date of Birth/Sex: 12-10-1940 (81 y.o. F) Treating RN: Levora Dredge Primary Care Laverle Pillard: [name] Other Clinician: Referring Soua Caltagirone: [name] Treating Zafir Schauer/Extender: Yaakov Guthrie in Treatment: 15 Active Problems Location of Pain Severity and Description of Pain Patient Has Paino Yes Site Locations Rate the pain. Current Pain Level: 4 Pain Management and Medication Current Pain Management: Notes pt states pain in LLE Electronic Signature(s) Signed: 12/20/2021 4:52:49 PM By: Levora Dredge Entered By: Levora Dredge on 12/20/2021 15:16:23 [name], [name] ([id number]) -------------------------------------------------------------------------------- Patient/Caregiver Education Details Patient Name: [name], [name] Date of Service: 12/20/2021 3:00 PM Medical Record Number: [id number] Patient Account Number: [id number] Date of Birth/Gender: 1941/06/12 (81 y.o. F) Treating RN: Levora Dredge Primary Care Physician: [name] Other Clinician: Referring Physician: [name] Treating Physician/Extender: Yaakov Guthrie in Treatment: 15 Education Assessment Education Provided To: Patient Education Topics Provided Wound/Skin Impairment: Handouts: Caring for Your Ulcer Methods: Explain/Verbal Responses: State content correctly Electronic Signature(s) Signed: 12/20/2021 4:52:49 PM By: Levora Dredge Entered By: Levora Dredge on 12/20/2021 16:48:08 Stafford,  MALGORZATA, ALBERT ([id number]) Visit Report for 12/20/2021 Arrival Information Details Patient Name: ERZA, MOTHERSHEAD. Date of Service: 12/20/2021 3:00 PM Medical Record Number: [id number] Patient Account Number: [id number] Date of Birth/Sex: Jun 30, 1941 (81 y.o. F) Treating RN: Levora Dredge Primary Care Xavius [name]: [name] Other Clinician: Referring Darbie Biancardi: [name] Treating Myiesha Edgar/Extender: Yaakov Guthrie in Treatment: 15 Visit Information History Since Last Visit Added or deleted any medications: No Patient Arrived: [name] Any new allergies or adverse reactions: No Arrival Time: 15:11 Had a fall or experienced change in No Accompanied By: self activities of daily living that may affect Transfer Assistance: None risk of falls: Patient Identification Verified: Yes Hospitalized since last visit: No Secondary Verification Process Completed: Yes Has Dressing in Place as Prescribed: Yes Patient Requires Transmission-Based Precautions: No Pain Present Now: Yes Patient Has Alerts: No Electronic Signature(s) Signed: 12/20/2021 4:52:49 PM By: Levora Dredge Entered By: Levora Dredge on 12/20/2021 15:12:27 Snelling, [name] ([id number]) -------------------------------------------------------------------------------- Clinic Level of Care Assessment Details Patient Name: Deckard, Blia L. Date of Service: 12/20/2021 3:00 PM Medical Record Number: [id number] Patient Account Number: [id number] Date of Birth/Sex: [birth year] (81 y.o. F) Treating RN: Levora Dredge Primary Care Andersen Mckiver: [name] Other Clinician: Referring Keshaun Dubey: [name] Treating Zaylin Runco/Extender: Yaakov Guthrie in Treatment: 15 Clinic Level of Care Assessment Items TOOL 4 Quantity Score '[]'  - Use when only an EandM is performed on FOLLOW-UP visit 0 ASSESSMENTS - Nursing Assessment / Reassessment '[]'  - Reassessment of Co-morbidities (includes updates in patient  status) 0 '[]'  - 0 Reassessment of Adherence to Treatment Plan ASSESSMENTS - Wound and Skin Assessment / Reassessment X - Simple Wound Assessment / Reassessment - one wound 1 5 '[]'  - 0 Complex Wound Assessment / Reassessment - multiple wounds '[]'  - 0 Dermatologic / Skin Assessment (not related to wound area) ASSESSMENTS - Focused Assessment '[]'  - Circumferential Edema Measurements - multi extremities 0 '[]'  - 0 Nutritional Assessment / Counseling / Intervention '[]'  - 0 Lower Extremity Assessment (monofilament, tuning fork, pulses) '[]'  - 0 Peripheral Arterial Disease Assessment (using hand held doppler) ASSESSMENTS - Ostomy and/or Continence Assessment and Care '[]'  - Incontinence Assessment and Management 0 '[]'  - 0 Ostomy Care Assessment and Management (repouching, etc.) PROCESS - Coordination of Care X - Simple Patient / Family Education for ongoing care 1 15 '[]'  - 0 Complex (extensive) Patient / Family Education for ongoing care '[]'  - 0 Staff obtains Programmer, systems, Records, Test Results / Process Orders '[]'  - 0 Staff telephones HHA, Nursing Homes / Clarify orders / etc '[]'  - 0 Routine Transfer to another Facility (non-emergent condition) '[]'  - 0 Routine Hospital Admission (non-emergent condition) '[]'  - 0 New Admissions / Biomedical engineer / Ordering NPWT, Apligraf, etc. '[]'  - 0 Emergency Hospital Admission (emergent condition) X- 1 10 Simple Discharge Coordination '[]'  - 0 Complex (extensive) Discharge Coordination PROCESS - Special Needs '[]'  - Pediatric / Minor Patient Management 0 '[]'  - 0 Isolation Patient Management '[]'  - 0 Hearing / Language / Visual special needs '[]'  - 0 Assessment of Community assistance (transportation, D/C planning, etc.) '[]'  - 0 Additional assistance / Altered mentation '[]'  - 0 Support Surface(s) Assessment (bed, cushion, seat, etc.) INTERVENTIONS - Wound Cleansing / Measurement [name], [name] ([id number]) '[]'  - 0 Simple Wound Cleansing - one wound X- 2  5 Complex Wound Cleansing - multiple wounds X- 1 5 Wound Imaging (photographs - any number of wounds) '[]'  - 0 Wound Tracing (instead of photographs) '[]'  - 0 Simple Wound Measurement - one wound  X- 2 5 Complex Wound Measurement - multiple wounds INTERVENTIONS - Wound Dressings '[]'  - Small Wound Dressing one or multiple wounds 0 X- 2 15 Medium Wound Dressing one or multiple wounds '[]'  - 0 Large Wound Dressing one or multiple wounds X- 1 5 Application of Medications - topical '[]'  - 0 Application of Medications - injection INTERVENTIONS - Miscellaneous '[]'  - External ear exam 0 '[]'  - 0 Specimen Collection (cultures, biopsies, blood, body fluids, etc.) '[]'  - 0 Specimen(s) / Culture(s) sent or taken to Lab for analysis '[]'  - 0 Patient Transfer (multiple staff / Civil Service fast streamer / Similar devices) X- 1 5 Simple Staple / Suture removal (25 or less) '[]'  - 0 Complex Staple / Suture removal (26 or more) '[]'  - 0 Hypo / Hyperglycemic Management (close monitor of Blood Glucose) '[]'  - 0 Ankle / Brachial Index (ABI) - do not check if billed separately '[]'  - 0 Vital Signs Has the patient been seen at the hospital within the last three years: Yes Total Score: 95 Level Of Care: New/Established - Level 3 Electronic Signature(s) Signed: 12/20/2021 4:52:49 PM By: Levora Dredge Entered By: Levora Dredge on 12/20/2021 16:47:50 Goldthwaite, [name] ([id number]) -------------------------------------------------------------------------------- Encounter Discharge Information Details Patient Name: [name], [name] Date of Service: 12/20/2021 3:00 PM Medical Record Number: [id number] Patient Account Number: [id number] Date of Birth/Sex: March 19, 1941 (81 y.o. F) Treating RN: Levora Dredge Primary Care Devaney Segers: [name] Other Clinician: Referring Brigg Cape: [name] Treating Dejan Angert/Extender: Yaakov Guthrie in Treatment: 15 Encounter Discharge Information Items Discharge Condition:  Stable Ambulatory Status: Walker Discharge Destination: Home Transportation: Private Auto Accompanied By: self Schedule Follow-up Appointment: Yes Clinical Summary of Care: Notes pt advised to call if wrap removed at [place] appointment, pt stated understanding. Electronic Signature(s) Signed: 12/20/2021 4:49:16 PM By: Levora Dredge Entered By: Levora Dredge on 12/20/2021 16:49:15 Foote, [name] ([id number]) -------------------------------------------------------------------------------- Lower Extremity Assessment Details Patient Name: [name], [name] Date of Service: 12/20/2021 3:00 PM Medical Record Number: [id number] Patient Account Number: [id number] Date of Birth/Sex: September 10, 1941 (81 y.o. F) Treating RN: Levora Dredge Primary Care Murriel Holwerda: [name] Other Clinician: Referring Kati Riggenbach: [name] Treating Helder Crisafulli/Extender: Yaakov Guthrie in Treatment: 15 Edema Assessment Assessed: [name]: No] [name]: No] Edema: [Left: Ye] [Right: s] Calf Left: Right: Point of Measurement: 31 cm From Medial Instep 44.6 cm Ankle Left: Right: Point of Measurement: 11 cm From Medial Instep 29.5 cm Vascular Assessment Pulses: Dorsalis Pedis Palpable: [Left:Yes] Posterior Tibial Palpable: [Left:Yes] Electronic Signature(s) Signed: 12/20/2021 4:52:49 PM By: Levora Dredge Entered By: Levora Dredge on 12/20/2021 15:28:37 [name], [name] ([id number]) -------------------------------------------------------------------------------- Multi Wound Chart Details Patient Name: [name], [name] Date of Service: 12/20/2021 3:00 PM Medical Record Number: [id number] Patient Account Number: [id number] Date of Birth/Sex: 1941-03-21 (81 y.o. F) Treating RN: Levora Dredge Primary Care Montavis Schubring: [name] Other Clinician: Referring Jshawn Hurta: [name] Treating Arya Luttrull/Extender: Yaakov Guthrie in Treatment: 15 Vital Signs Height(in):  63 Pulse(bpm): 90 Weight(lbs): 198 Blood Pressure(mmHg): 145/91 Body Mass Index(BMI): 35.1 Temperature(F): 97.5 Respiratory Rate(breaths/min): 18 Photos: [N/A:N/A] Wound Location: Left, Anterior Lower Leg Left, Distal Lower Leg N/A Wounding Event: Skin Tear/Laceration Trauma N/A Primary Etiology: Venous Leg Ulcer Venous Leg Ulcer N/A Comorbid History: Cataracts, Osteoarthritis, Received Cataracts, Osteoarthritis, Received N/A Radiation Radiation Date Acquired: 07/30/2021 12/04/2021 N/A Weeks of Treatment: 15 1 N/A Wound Status: Open Open N/A Wound Recurrence: No No N/A Measurements L x W x D (cm) 1.6x0.6x0.3 0.4x0.4x0.1 N/A Area (cm) : 0.754 0.126 N/A Volume (cm) : 0.226 0.013 N/A %

## 2021-12-21 ENCOUNTER — Ambulatory Visit (INDEPENDENT_AMBULATORY_CARE_PROVIDER_SITE_OTHER): Payer: PPO

## 2021-12-21 ENCOUNTER — Ambulatory Visit: Payer: PPO

## 2021-12-21 ENCOUNTER — Telehealth: Payer: Self-pay

## 2021-12-21 DIAGNOSIS — S81809S Unspecified open wound, unspecified lower leg, sequela: Secondary | ICD-10-CM | POA: Diagnosis not present

## 2021-12-21 DIAGNOSIS — R238 Other skin changes: Secondary | ICD-10-CM

## 2021-12-21 DIAGNOSIS — M7989 Other specified soft tissue disorders: Secondary | ICD-10-CM | POA: Diagnosis not present

## 2021-12-21 DIAGNOSIS — M79606 Pain in leg, unspecified: Secondary | ICD-10-CM | POA: Diagnosis not present

## 2021-12-21 NOTE — Telephone Encounter (Signed)
Made several attempts to contact patient in regards to annual wellness visit for medicare today @ 1:15pm, left vm message on patient home and mobile number advising of the appointment and to contact the office to reschedule when available. TM

## 2021-12-25 ENCOUNTER — Other Ambulatory Visit: Payer: Self-pay

## 2021-12-25 DIAGNOSIS — I872 Venous insufficiency (chronic) (peripheral): Secondary | ICD-10-CM | POA: Diagnosis not present

## 2021-12-25 NOTE — Progress Notes (Signed)
[name], [name] ([id number]) Visit Report for 12/25/2021 Physician Orders Details Patient Name: [name], [name] ARTZ. Date of Service: 12/25/2021 8:00 AM Medical Record Number: [id number] Patient Account Number: [id number] Date of Birth/Sex: September 18, 1941 (81 y.o. F) Treating RN: Donnamarie Poag Primary Care Provider: [name] Other Clinician: Referring Provider: [name] Treating Provider/Extender: Suella Grove in Treatment: 29 Verbal / Phone Orders: No Diagnosis Coding Follow-up Appointments o Return Appointment in 1 week. - once a week provider o Nurse Visit as needed - once per week Bathing/ Shower/ Hygiene o May shower with wound dressing protected with water repellent cover or cast protector. o No tub bath. Anesthetic (Use 'Patient Medications' Section for Anesthetic Order Entry) o Lidocaine applied to wound bed Edema Control - Lymphedema / Segmental Compressive Device / Other o Patient to wear own compression stockings. Remove compression stockings every night before going to bed and put on every morning when getting up. - right leg o Elevate, Exercise Daily and Avoid Standing for Long Periods of Time. o Elevate legs to the level of the heart and pump ankles as often as possible o Elevate leg(s) parallel to the floor when sitting. o DO YOUR BEST to sleep in the bed at night. DO NOT sleep in your recliner. Long hours of sitting in a recliner leads to swelling of the legs and/or potential wounds on your backside. Additional Orders / Instructions o Follow Nutritious Diet and Increase Protein Intake Wound Treatment Wound #1 - Lower Leg Wound Laterality: Left, Anterior Cleanser: Soap and Water 2 x Per Week/30 Days Discharge Instructions: Gently cleanse wound with antibacterial soap, rinse and pat dry prior to dressing wounds Topical: Gentamicin 2 x Per Week/30 Days Discharge Instructions: Apply as directed by provider. in office only Topical: Triamcinolone  Acetonide Cream, 0.1%, 15 (g) tube 2 x Per Week/30 Days Discharge Instructions: Apply as directed by provider. Primary Dressing: Silvercel Small 2x2 (in/in) 2 x Per Week/30 Days Discharge Instructions: Apply Silvercel Small 2x2 (in/in) as instructed Secondary Dressing: Zetuvit Plus Silicone Non-bordered 5x5 (in/in) 2 x Per Week/30 Days Secured With: Coban Cohesive Bandage 4x5 (yds) Stretched 2 x Per Week/30 Days Discharge Instructions: Apply Coban as directed. Secured With: [employer] or Non-Sterile 6-ply 4.5x4 (yd/yd) 2 x Per Week/30 Days Discharge Instructions: Apply Kerlix as directed Wound #2 - Lower Leg Wound Laterality: Left, Distal Cleanser: Soap and Water 2 x Per Week/30 Days Discharge Instructions: Gently cleanse wound with antibacterial soap, rinse and pat dry prior to dressing wounds Topical: Gentamicin 2 x Per Week/30 Days Discharge Instructions: Apply as directed by provider. in office only [name], TAPSCOTT. ([id number]) Topical: Triamcinolone Acetonide Cream, 0.1%, 15 (g) tube 2 x Per Week/30 Days Discharge Instructions: Apply as directed by provider. Primary Dressing: Silvercel Small 2x2 (in/in) 2 x Per Week/30 Days Discharge Instructions: Apply Silvercel Small 2x2 (in/in) as instructed Secondary Dressing: Zetuvit Plus Silicone Non-bordered 5x5 (in/in) 2 x Per Week/30 Days Secured With: Coban Cohesive Bandage 4x5 (yds) Stretched 2 x Per Week/30 Days Discharge Instructions: Apply Coban as directed. Secured With: [employer] or Non-Sterile 6-ply 4.5x4 (yd/yd) 2 x Per Week/30 Days Discharge Instructions: Apply Kerlix as directed Electronic Signature(s) Signed: 12/25/2021 3:55:39 PM By: Donnamarie Poag Entered By: Donnamarie Poag on 12/25/2021 08:14:40 [name], [name] ([id number]) -------------------------------------------------------------------------------- [place] Details Patient Name: [name], [name] Date of Service: 12/25/2021 Medical Record Number:  [id number] Patient Account Number: [id number] Date of Birth/Sex: [birth year] (81 y.o. 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

## 2021-12-25 NOTE — Progress Notes (Signed)
Dressings X - Small Wound Dressing one or multiple wounds 2 10 []  - 0 Medium Wound Dressing one or multiple wounds []  - 0 Large Wound Dressing one or multiple wounds X- 1 5 Application of Medications - topical []  - 0 Application of Medications - injection INTERVENTIONS - Miscellaneous []  - External ear exam 0 []  - 0 Specimen Collection (cultures, biopsies, blood, body fluids, etc.) []  - 0 Specimen(s) / Culture(s) sent or taken to Lab for analysis []  - 0 Patient Transfer (multiple staff / [employer] fast streamer / Similar devices) []  - 0 Simple Staple / Suture removal (25 or less) []  - 0 Complex Staple / Suture removal (26 or more) []  - 0 Hypo / Hyperglycemic Management (close monitor of Blood Glucose) []  - 0 Ankle / Brachial Index (ABI) - do not check if billed separately X- 1 5 Vital Signs Has the patient been seen at the hospital within the last three years: Yes Total Score: 90 Level Of Care: New/Established - Level 3 Electronic Signature(s) Signed: 12/25/2021 3:55:39 PM By: Donnamarie Poag Entered By: Donnamarie Poag on 12/25/2021 08:24:24 [name], [name] LMarland Kitchen ([id number]) -------------------------------------------------------------------------------- Complex / Palliative Patient Assessment Details Patient Name: [name], [name] Date of Service: 12/25/2021 8:00 AM Medical Record Number: [id number] Patient Account Number: [id number] Date of Birth/Sex: 06-24-1941 (80 y.o. F) Treating RN: Donnamarie Poag Primary Care Price Lachapelle: [name] Other Clinician: Referring Fulton Merry: [name] Treating Gregery Walberg/Extender: Skipper Cliche in Treatment: 16 Complex Wound Management Criteria Patient has remarkable or complex co-morbidities requiring medications or treatments that extend wound healing times.  Examples: o Diabetes mellitus with chronic renal failure or end stage renal disease requiring dialysis o Advanced or poorly controlled rheumatoid arthritis o Diabetes mellitus and end stage chronic obstructive pulmonary disease o Active cancer with current chemo- or radiation therapy con't lymphedema Palliative Wound Management Criteria Care Approach Wound Care Plan: Complex Wound Management Electronic Signature(s) Signed: 12/25/2021 4:05:53 PM By: Donnamarie Poag Signed: 12/25/2021 5:27:53 PM By: Worthy Keeler PA-C Entered By: Donnamarie Poag on 12/25/2021 16:05:53 [name] ([id number]) -------------------------------------------------------------------------------- Encounter Discharge Information Details Patient Name: [name], [name] Date of Service: 12/25/2021 8:00 AM Medical Record Number: [id number] Patient Account Number: [id number] Date of Birth/Sex: 11-14-1941 (80 y.o. F) Treating RN: Donnamarie Poag Primary Care Tequilla Cousineau: [name] Other Clinician: Referring Michaiah Maiden: [name] Treating Aya Geisel/Extender: Suella Grove in Treatment: 16 Encounter Discharge Information Items Discharge Condition: Stable Ambulatory Status: Walker Discharge Destination: Home Transportation: Private Auto Accompanied By: self Schedule Follow-up Appointment: Yes Clinical Summary of Care: Electronic Signature(s) Signed: 12/25/2021 3:55:39 PM By: Donnamarie Poag Entered By: Donnamarie Poag on 12/25/2021 08:22:49 [name] ([id number]) -------------------------------------------------------------------------------- Wound Assessment Details Patient Name: [name], [name] Date of Service: 12/25/2021 8:00 AM Medical Record Number: [id number] Patient Account Number: [id number] Date of Birth/Sex: [birth year] (80 y.o. F) Treating RN: Donnamarie Poag Primary Care Deardra Hinkley: [name] Other Clinician: Referring Tyronn Golda: [name] Treating Rondel Episcopo/Extender: Suella Grove in Treatment:  16 Wound Status Wound Number: 1 Primary Etiology: Venous Leg Ulcer Wound Location: Left, Anterior Lower Leg Wound Status: Open Wounding Event: Skin Tear/Laceration Comorbid History: Cataracts, Osteoarthritis, Received Radiation Date Acquired: 07/30/2021 Weeks Of Treatment: 16 Clustered Wound: No Wound Measurements Length: (cm) 0.7 Width: (cm) 0.3 Depth: (cm) 0.2 Area: (cm) 0.165 Volume: (cm) 0.033 % Reduction in Area: 86.9% % Reduction in Volume: 94.7% Epithelialization: Small (1-33%) Wound Description Classification: Full Thickness Without Exposed Support Structures Wound Margin: Flat and Intact Exudate Amount: Medium Exudate Type: Serosanguineous Exudate Color: red, brown Foul Odor After Cleansing:  [name] ([id number]) Visit Report for 12/25/2021 Arrival Information Details Patient Name: [name], HECKART. Date of Service: 12/25/2021 8:00 AM Medical Record Number: [id number] Patient Account Number: [id number] Date of Birth/Sex: 21-Dec-1940 (80 y.o. F) Treating RN: Donnamarie Poag Primary Care Wania Longstreth: [name] Other Clinician: Referring Veralyn Lopp: [name] Treating Senai Kingsley/Extender: Suella Grove in Treatment: 46 Visit Information History Since Last Visit Added or deleted any medications: No Patient Arrived: Ambulatory Had a fall or experienced change in No Arrival Time: 08:03 activities of daily living that may affect Accompanied By: self risk of falls: Transfer Assistance: None Hospitalized since last visit: No Patient Identification Verified: Yes Has Dressing in Place as Prescribed: Yes Secondary Verification Process Completed: Yes Pain Present Now: No Patient Requires Transmission-Based Precautions: No Patient Has Alerts: No Electronic Signature(s) Signed: 12/25/2021 3:55:39 PM By: Donnamarie Poag Entered By: Donnamarie Poag on 12/25/2021 08:08:51 [name], [name] Vernon. ([id number]) -------------------------------------------------------------------------------- Clinic Level of Care Assessment Details Patient Name: [name], [name] Date of Service: 12/25/2021 8:00 AM Medical Record Number: [id number] Patient Account Number: [id number] Date of Birth/Sex: January 24, 1941 (80 y.o. F) Treating RN: Donnamarie Poag Primary Care Nashae Maudlin: [name] Other Clinician: Referring Less Woolsey: [name] Treating Ritamarie Arkin/Extender: Suella Grove in Treatment: 16 Clinic Level of Care Assessment Items TOOL 4 Quantity Score []  - Use when only an EandM is performed on FOLLOW-UP visit 0 ASSESSMENTS - Nursing Assessment / Reassessment []  - Reassessment of Co-morbidities (includes updates in patient status) 0 []  - 0 Reassessment of Adherence to Treatment Plan ASSESSMENTS - Wound and Skin  Assessment / Reassessment []  - Simple Wound Assessment / Reassessment - one wound 0 X- 2 5 Complex Wound Assessment / Reassessment - multiple wounds []  - 0 Dermatologic / Skin Assessment (not related to wound area) ASSESSMENTS - Focused Assessment []  - Circumferential Edema Measurements - multi extremities 0 []  - 0 Nutritional Assessment / Counseling / Intervention []  - 0 Lower Extremity Assessment (monofilament, tuning fork, pulses) []  - 0 Peripheral Arterial Disease Assessment (using hand held doppler) ASSESSMENTS - Ostomy and/or Continence Assessment and Care []  - Incontinence Assessment and Management 0 []  - 0 Ostomy Care Assessment and Management (repouching, etc.) PROCESS - Coordination of Care X - Simple Patient / Family Education for ongoing care 1 15 []  - 0 Complex (extensive) Patient / Family Education for ongoing care []  - 0 Staff obtains Programmer, systems, Records, Test Results / Process Orders []  - 0 Staff telephones HHA, Nursing Homes / Clarify orders / etc []  - 0 Routine Transfer to another Facility (non-emergent condition) []  - 0 Routine Hospital Admission (non-emergent condition) []  - 0 New Admissions / Biomedical engineer / Ordering NPWT, Apligraf, etc. []  - 0 Emergency Hospital Admission (emergent condition) X- 1 10 Simple Discharge Coordination []  - 0 Complex (extensive) Discharge Coordination PROCESS - Special Needs []  - Pediatric / Minor Patient Management 0 []  - 0 Isolation Patient Management []  - 0 Hearing / Language / Visual special needs []  - 0 Assessment of Community assistance (transportation, D/C planning, etc.) []  - 0 Additional assistance / Altered mentation []  - 0 Support Surface(s) Assessment (bed, cushion, seat, etc.) INTERVENTIONS - Wound Cleansing / Measurement [name], [name] ([id number]) []  - 0 Simple Wound Cleansing - one wound X- 2 5 Complex Wound Cleansing - multiple wounds X- 1 5 Wound Imaging (photographs - any number  of wounds) []  - 0 Wound Tracing (instead of photographs) []  - 0 Simple Wound Measurement - one wound X- 2 5 Complex Wound Measurement - multiple wounds INTERVENTIONS - Wound  [name] ([id number]) Visit Report for 12/25/2021 Arrival Information Details Patient Name: [name], HECKART. Date of Service: 12/25/2021 8:00 AM Medical Record Number: [id number] Patient Account Number: [id number] Date of Birth/Sex: 21-Dec-1940 (80 y.o. F) Treating RN: Donnamarie Poag Primary Care Wania Longstreth: [name] Other Clinician: Referring Veralyn Lopp: [name] Treating Senai Kingsley/Extender: Suella Grove in Treatment: 46 Visit Information History Since Last Visit Added or deleted any medications: No Patient Arrived: Ambulatory Had a fall or experienced change in No Arrival Time: 08:03 activities of daily living that may affect Accompanied By: self risk of falls: Transfer Assistance: None Hospitalized since last visit: No Patient Identification Verified: Yes Has Dressing in Place as Prescribed: Yes Secondary Verification Process Completed: Yes Pain Present Now: No Patient Requires Transmission-Based Precautions: No Patient Has Alerts: No Electronic Signature(s) Signed: 12/25/2021 3:55:39 PM By: Donnamarie Poag Entered By: Donnamarie Poag on 12/25/2021 08:08:51 [name], [name] Vernon. ([id number]) -------------------------------------------------------------------------------- Clinic Level of Care Assessment Details Patient Name: [name], [name] Date of Service: 12/25/2021 8:00 AM Medical Record Number: [id number] Patient Account Number: [id number] Date of Birth/Sex: January 24, 1941 (80 y.o. F) Treating RN: Donnamarie Poag Primary Care Nashae Maudlin: [name] Other Clinician: Referring Less Woolsey: [name] Treating Ritamarie Arkin/Extender: Suella Grove in Treatment: 16 Clinic Level of Care Assessment Items TOOL 4 Quantity Score []  - Use when only an EandM is performed on FOLLOW-UP visit 0 ASSESSMENTS - Nursing Assessment / Reassessment []  - Reassessment of Co-morbidities (includes updates in patient status) 0 []  - 0 Reassessment of Adherence to Treatment Plan ASSESSMENTS - Wound and Skin  Assessment / Reassessment []  - Simple Wound Assessment / Reassessment - one wound 0 X- 2 5 Complex Wound Assessment / Reassessment - multiple wounds []  - 0 Dermatologic / Skin Assessment (not related to wound area) ASSESSMENTS - Focused Assessment []  - Circumferential Edema Measurements - multi extremities 0 []  - 0 Nutritional Assessment / Counseling / Intervention []  - 0 Lower Extremity Assessment (monofilament, tuning fork, pulses) []  - 0 Peripheral Arterial Disease Assessment (using hand held doppler) ASSESSMENTS - Ostomy and/or Continence Assessment and Care []  - Incontinence Assessment and Management 0 []  - 0 Ostomy Care Assessment and Management (repouching, etc.) PROCESS - Coordination of Care X - Simple Patient / Family Education for ongoing care 1 15 []  - 0 Complex (extensive) Patient / Family Education for ongoing care []  - 0 Staff obtains Programmer, systems, Records, Test Results / Process Orders []  - 0 Staff telephones HHA, Nursing Homes / Clarify orders / etc []  - 0 Routine Transfer to another Facility (non-emergent condition) []  - 0 Routine Hospital Admission (non-emergent condition) []  - 0 New Admissions / Biomedical engineer / Ordering NPWT, Apligraf, etc. []  - 0 Emergency Hospital Admission (emergent condition) X- 1 10 Simple Discharge Coordination []  - 0 Complex (extensive) Discharge Coordination PROCESS - Special Needs []  - Pediatric / Minor Patient Management 0 []  - 0 Isolation Patient Management []  - 0 Hearing / Language / Visual special needs []  - 0 Assessment of Community assistance (transportation, D/C planning, etc.) []  - 0 Additional assistance / Altered mentation []  - 0 Support Surface(s) Assessment (bed, cushion, seat, etc.) INTERVENTIONS - Wound Cleansing / Measurement [name], [name] ([id number]) []  - 0 Simple Wound Cleansing - one wound X- 2 5 Complex Wound Cleansing - multiple wounds X- 1 5 Wound Imaging (photographs - any number  of wounds) []  - 0 Wound Tracing (instead of photographs) []  - 0 Simple Wound Measurement - one wound X- 2 5 Complex Wound Measurement - multiple wounds INTERVENTIONS - Wound

## 2021-12-26 ENCOUNTER — Ambulatory Visit: Payer: PPO

## 2021-12-29 ENCOUNTER — Other Ambulatory Visit: Payer: Self-pay

## 2021-12-29 ENCOUNTER — Encounter: Payer: PPO | Attending: Physician Assistant | Admitting: Physician Assistant

## 2021-12-29 DIAGNOSIS — R21 Rash and other nonspecific skin eruption: Secondary | ICD-10-CM | POA: Insufficient documentation

## 2021-12-29 DIAGNOSIS — M797 Fibromyalgia: Secondary | ICD-10-CM | POA: Diagnosis not present

## 2021-12-29 DIAGNOSIS — L97822 Non-pressure chronic ulcer of other part of left lower leg with fat layer exposed: Secondary | ICD-10-CM | POA: Insufficient documentation

## 2021-12-29 DIAGNOSIS — M170 Bilateral primary osteoarthritis of knee: Secondary | ICD-10-CM | POA: Diagnosis not present

## 2021-12-29 DIAGNOSIS — Z09 Encounter for follow-up examination after completed treatment for conditions other than malignant neoplasm: Secondary | ICD-10-CM | POA: Diagnosis not present

## 2021-12-29 DIAGNOSIS — I872 Venous insufficiency (chronic) (peripheral): Secondary | ICD-10-CM | POA: Diagnosis not present

## 2021-12-29 DIAGNOSIS — B354 Tinea corporis: Secondary | ICD-10-CM | POA: Insufficient documentation

## 2021-12-29 NOTE — Progress Notes (Addendum)
[name], [name] ([id number]) Visit Report for 12/29/2021 Chief Complaint Document Details Patient Name: [name], [name]. Date of Service: 12/29/2021 10:30 AM Medical Record Number: [id number] Patient Account Number: [id number] Date of Birth/Sex: Jun 19, 1941 (80 y.o. F) Treating RN: Levora Dredge Primary Care Provider: [name] Other Clinician: Referring Provider: [name] Treating Provider/Extender: Skipper Cliche in Treatment: 17 Information Obtained from: Patient Chief Complaint Left LE Ulcer Electronic Signature(s) Signed: 12/29/2021 10:48:44 AM By: Worthy Keeler PA-C Entered By: Worthy Keeler on 12/29/2021 10:48:44 Knoble, [name] ([id number]) -------------------------------------------------------------------------------- HPI Details Patient Name: [name], [name] Date of Service: 12/29/2021 10:30 AM Medical Record Number: [id number] Patient Account Number: [id number] Date of Birth/Sex: 1941-04-25 (80 y.o. F) Treating RN: Levora Dredge Primary Care Provider: [name] Other Clinician: Referring Provider: [name] Treating Provider/Extender: Skipper Cliche in Treatment: 17 History of Present Illness HPI Description: 08/31/2021 upon evaluation today patient presents for initial inspection here in our clinic concerning issues that she is having with a wound on her left anterior lower extremity. This is actually an area that she unfortunately tells me she was picking up a cage and dropped hitting the anterior portion of her shin causing a skin tear. She did not go to the hospital due to the fact that she did not realize that she would need to. This happened on July 30, 2021. With that being said the patient has been on Augmentin she has about 1 and half days of this left. Nonetheless she tells me that she does have a longstanding history of leg swelling. She does have chronic venous insufficiency as well it appears to me. She also has some  evidence of may be early stages of lymphedema. She has a history of fibromyalgia, bilateral knee osteoarthritis, and appears to have a fungal infection of the right ankle region. In regard to the arthritis she tells me her legs tend to swell less after she gets her knee injections allowing her to be able to walk and get around much more effectively. Following the injections she does well for a while and then when that seems to be wearing off she tends to start having issues with more swelling because she is more mobile that makes sense from a venous standpoint. 10/10; I was asked to see this patient today who came in for a nurse visit. Firstly she has developed a widespread very pruritic rash involving her abdomen and back. Lesser extent on her feet. The second problem is that she is complaining about the tightness of the wound and wrap on the left leg where the wound is. 09/14/2021 upon evaluation today patient appears to be doing decently well in regard to her leg ulcer. The biggest issue she is having is a significant issue with a rash currently. I do not see any signs of active infection systemically which is great news although with regard to the rash she did see dermatology they told her she had eczema that does not seem to sit right with me out think that is mainly the issues she has going on. I do think she may benefit from the use of a short course of prednisone which should help with the rash which is absolutely driving her crazy. 09/21/2021 upon evaluation today patient appears to be doing decently well in regard to her wound this is still pretty deep. The prednisone has helped with the rash although is not completely done and it is doing much better than what it was. Fortunately there is  no signs of active infection systemically at this time also great news. 09/28/2021 upon evaluation today patient's wound on her leg actually showing some signs of improvement which is good news I am happy  in that regard. Fortunately there does not appear to be any evidence of active infection systemically which is great news. Unfortunately she is still continuing to have issues with her rash which is almost worse than the actual wound itself for her at this point. 10/05/2021 upon evaluation today patient appears to be doing well with regard to her leg ulcer. She is tolerating the dressing changes without complication and overall I am extremely pleased with where we stand. With that being said I do think that she could possibly benefit from a snap VAC if we can gain approval I am not certain if this will be improved or not. Nonetheless I definitely think we can give this a try and if we get approval I think this will help speed up the healing process to be honest. In the interim I do believe the [facility] is doing decently well. 10/10/2021 upon evaluation today patient appears to be doing well with regard to her wound although is not significantly smaller. We still have quite a bit of depth here. Fortunately there is no signs of active infection systemically at this time. No fevers, chills, nausea, vomiting, or diarrhea. 10/17/2021 upon evaluation today patient appears to be doing poorly in regard to her leg compared to last time there is a lot of irritation today. She actually kept the Zetuvit on all week which does not seem to have been beneficial for her. She was supposed to change it at home we did order her supplies but she did not even find the supplies on her porch until she tells me yesterday. Either way we had already decided that the best thing was probably can to be to have her come in for nurse visits. I think that still probably the best thing to do I feel like there is a bit of dementia here that I was not aware of last week or even the previous visit to be honest that is going to prohibit her from being able to make these dressing changes herself. 10/27/2021 upon evaluation today  patient appears to be doing well with regard to her wound currently. She is actually showing signs of excellent improvement and I do feel like the collagen is doing a great job. I am extremely pleased with where we stand today. 11/03/2021 upon evaluation patient's wound bed actually showed signs of good granulation epithelization at this point. I am actually extremely pleased with where we stand and how this is progressing I think the patient is making great progress. She is very happy with today's findings. 12/27. This is a patient with wounds on her left lateral lower extremity. She has 2 open wounds. She did not tolerate 3 layer compression we have therefore been using Tubigrip. We use dermacol last week but we have endoform to use this week 12/04/2021 upon evaluation today patient appears to be doing well with regard to her wound. There is still some irritation around where she has a small area that she bumped inferior to where the original wound was. This is not appearing to be too significant at this point which is good news. 12/07/2020 upon evaluation today patient appears to be doing well with regard to her wounds actually look much better even than earlier this week when I saw her. They are measuring  smaller. Unfortunately she does appear to have some cellulitis of the leg in general. There is really nothing significant that I can culture here so I am just can have to treat prophylactically and see if we can get this under control. The patient voiced understanding and does realize what is going on as well as we discussed that today. 1/25; patient presents for follow-up. She reports finishing her course of antibiotics. She reports improvement in her symptoms. She is only able to tolerate the compression wrap for a few days before she takes this off herself. It is unclear if she is dressing the wound when she takes the compression wraps off. She is scheduled for her venous reflux studies tomorrow.  She currently denies signs of infection. 12/29/2021 upon evaluation today patient actually appears to be completely healed which is great news. She has been using her compression socks which is good. Fortunately I do not see any signs of active infection locally nor systemically at this time. RUTHER, EPHRAIM ([id number]) Electronic Signature(s) Signed: 12/29/2021 11:11:16 AM By: Worthy Keeler PA-C Previous Signature: 12/29/2021 11:09:39 AM Version By: Worthy Keeler PA-C Entered By: Worthy Keeler on 12/29/2021 11:11:16 Pretty, [name] ([id number]) -------------------------------------------------------------------------------- Physical Exam Details Patient Name: Weatherbee, Kathaleya L. Date of Service: 12/29/2021 10:30 AM Medical Record Number: [id number] Patient Account Number: [id number] Date of Birth/Sex: 27-Mar-1941 (80 y.o. F) Treating RN: Levora Dredge Primary Care Provider: [name] Other Clinician: Referring Provider: [name] Treating Provider/Extender: Skipper Cliche in Treatment: 54 Constitutional Well-nourished and well-hydrated in no acute distress. Respiratory normal breathing without difficulty. Psychiatric this patient is able to make decisions and demonstrates good insight into disease process. Alert and Oriented x 3. pleasant and cooperative. Notes Upon inspection patient's wound bed actually showed signs of complete epithelization I am actually very pleased with where we stand and she is doing well with her compression sock at this point. I think that we can continue as such with that I will probably have her continue with the dressings with the alginate and the border foam as well just to make sure everything is okay but overall I am extremely pleased. Electronic Signature(s) Signed: 12/29/2021 11:11:37 AM By: Worthy Keeler PA-C Entered By: Worthy Keeler on 12/29/2021 11:11:37 Lukins, [name]  ([id number]) -------------------------------------------------------------------------------- Physician Orders Details Patient Name: Rencher, Nydia L. Date of Service: 12/29/2021 10:30 AM Medical Record Number: [id number] Patient Account Number: [id number] Date of Birth/Sex: 07-24-1941 (80 y.o. F) Treating RN: Levora Dredge Primary Care Provider: [name] Other Clinician: Referring Provider: [name] Treating Provider/Extender: Skipper Cliche in Treatment: 17 Verbal / Phone Orders: No Diagnosis Coding ICD-10 Coding Code Description I87.2 Venous insufficiency (chronic) (peripheral) L97.822 Non-pressure chronic ulcer of other part of left lower leg with fat layer exposed M79.7 Fibromyalgia M17.0 Bilateral primary osteoarthritis of knee B35.4 Tinea corporis R21 Rash and other nonspecific skin eruption Discharge From [facility] o Wear compression garments daily. Put garments on first thing when you wake up and remove them before bed. - During the day and remove at bedtime and apply lotion (Eucerin) to leg o Moisturize legs daily after removing compression garments. - continue dressing wound for 2 more weeks. Please call with any issues. Wound clinic will call Vascular to check on appointment with doctor, you were provided vascular number as well and please do call to check in on appointment as well. o Elevate, Exercise Daily and Avoid Standing for Long Periods of Time. o DO YOUR BEST  to sleep in the bed at night. DO NOT sleep in your recliner. Long hours of sitting in a recliner leads to swelling of the legs and/or potential wounds on your backside. Bathing/ Shower/ Hygiene o May shower with wound dressing protected with water repellent cover or cast protector. o No tub bath. Anesthetic (Use 'Patient Medications' Section for Anesthetic Order Entry) o Lidocaine applied to wound bed Edema Control - Lymphedema / Segmental Compressive Device / Other o  Patient to wear own compression stockings. Remove compression stockings every night before going to bed and put on every morning when getting up. - right leg o Elevate, Exercise Daily and Avoid Standing for Long Periods of Time. o Elevate legs to the level of the heart and pump ankles as often as possible o Elevate leg(s) parallel to the floor when sitting. o DO YOUR BEST to sleep in the bed at night. DO NOT sleep in your recliner. Long hours of sitting in a recliner leads to swelling of the legs and/or potential wounds on your backside. Additional Orders / Instructions o Follow Nutritious Diet and Increase Protein Intake Electronic Signature(s) Signed: 12/29/2021 4:48:07 PM By: Worthy Keeler PA-C Signed: 01/01/2022 5:12:29 PM By: Levora Dredge Entered By: Levora Dredge on 12/29/2021 11:03:32 Grieger, [name] ([id number]) -------------------------------------------------------------------------------- Problem List Details Patient Name: Brule, Terrisa L. Date of Service: 12/29/2021 10:30 AM Medical Record Number: [id number] Patient Account Number: [id number] Date of Birth/Sex: 1941/04/01 (80 y.o. F) Treating RN: Levora Dredge Primary Care Provider: [name] Other Clinician: Referring Provider: [name] Treating Provider/Extender: Skipper Cliche in Treatment: 17 Active Problems ICD-10 Encounter Code Description Active Date MDM Diagnosis I87.2 Venous insufficiency (chronic) (peripheral) 08/31/2021 No Yes L97.822 Non-pressure chronic ulcer of other part of left lower leg with fat layer 08/31/2021 No Yes exposed M79.7 Fibromyalgia 08/31/2021 No Yes M17.0 Bilateral primary osteoarthritis of knee 08/31/2021 No Yes B35.4 Tinea corporis 08/31/2021 No Yes R21 Rash and other nonspecific skin eruption 09/04/2021 No Yes Inactive Problems Resolved Problems Electronic Signature(s) Signed: 12/29/2021 10:48:38 AM By: Worthy Keeler PA-C Entered By: Worthy Keeler on  12/29/2021 10:48:38 Fadness, [name] ([id number]) -------------------------------------------------------------------------------- Progress Note Details Patient Name: Aber, Niasia L. Date of Service: 12/29/2021 10:30 AM Medical Record Number: [id number] Patient Account Number: [id number] Date of Birth/Sex: 1941-11-04 (80 y.o. F) Treating RN: Levora Dredge Primary Care Provider: [name] Other Clinician: Referring Provider: [name] Treating Provider/Extender: Skipper Cliche in Treatment: 17 Subjective Chief Complaint Information obtained from Patient Left LE Ulcer History of Present Illness (HPI) 08/31/2021 upon evaluation today patient presents for initial inspection here in our clinic concerning issues that she is having with a wound on her left anterior lower extremity. This is actually an area that she unfortunately tells me she was picking up a cage and dropped hitting the anterior portion of her shin causing a skin tear. She did not go to the hospital due to the fact that she did not realize that she would need to. This happened on July 30, 2021. With that being said the patient has been on Augmentin she has about 1 and half days of this left. Nonetheless she tells me that she does have a longstanding history of leg swelling. She does have chronic venous insufficiency as well it appears to me. She also has some evidence of may be early stages of lymphedema. She has a history of fibromyalgia, bilateral knee osteoarthritis, and appears to have a fungal infection of the right ankle region. In regard  to the arthritis she tells me her legs tend to swell less after she gets her knee injections allowing her to be able to walk and get around much more effectively. Following the injections she does well for a while and then when that seems to be wearing off she tends to start having issues with more swelling because she is more mobile that makes sense from a venous  standpoint. 10/10; I was asked to see this patient today who came in for a nurse visit. Firstly she has developed a widespread very pruritic rash involving her abdomen and back. Lesser extent on her feet. The second problem is that she is complaining about the tightness of the wound and wrap on the left leg where the wound is. 09/14/2021 upon evaluation today patient appears to be doing decently well in regard to her leg ulcer. The biggest issue she is having is a significant issue with a rash currently. I do not see any signs of active infection systemically which is great news although with regard to the rash she did see dermatology they told her she had eczema that does not seem to sit right with me out think that is mainly the issues she has going on. I do think she may benefit from the use of a short course of prednisone which should help with the rash which is absolutely driving her crazy. 09/21/2021 upon evaluation today patient appears to be doing decently well in regard to her wound this is still pretty deep. The prednisone has helped with the rash although is not completely done and it is doing much better than what it was. Fortunately there is no signs of active infection systemically at this time also great news. 09/28/2021 upon evaluation today patient's wound on her leg actually showing some signs of improvement which is good news I am happy in that regard. Fortunately there does not appear to be any evidence of active infection systemically which is great news. Unfortunately she is still continuing to have issues with her rash which is almost worse than the actual wound itself for her at this point. 10/05/2021 upon evaluation today patient appears to be doing well with regard to her leg ulcer. She is tolerating the dressing changes without complication and overall I am extremely pleased with where we stand. With that being said I do think that she could possibly benefit from a snap VAC  if we can gain approval I am not certain if this will be improved or not. Nonetheless I definitely think we can give this a try and if we get approval I think this will help speed up the healing process to be honest. In the interim I do believe the [facility] is doing decently well. 10/10/2021 upon evaluation today patient appears to be doing well with regard to her wound although is not significantly smaller. We still have quite a bit of depth here. Fortunately there is no signs of active infection systemically at this time. No fevers, chills, nausea, vomiting, or diarrhea. 10/17/2021 upon evaluation today patient appears to be doing poorly in regard to her leg compared to last time there is a lot of irritation today. She actually kept the Zetuvit on all week which does not seem to have been beneficial for her. She was supposed to change it at home we did order her supplies but she did not even find the supplies on her porch until she tells me yesterday. Either way we had already decided that the  best thing was probably can to be to have her come in for nurse visits. I think that still probably the best thing to do I feel like there is a bit of dementia here that I was not aware of last week or even the previous visit to be honest that is going to prohibit her from being able to make these dressing changes herself. 10/27/2021 upon evaluation today patient appears to be doing well with regard to her wound currently. She is actually showing signs of excellent improvement and I do feel like the collagen is doing a great job. I am extremely pleased with where we stand today. 11/03/2021 upon evaluation patient's wound bed actually showed signs of good granulation epithelization at this point. I am actually extremely pleased with where we stand and how this is progressing I think the patient is making great progress. She is very happy with today's findings. 12/27. This is a patient with wounds on her left  lateral lower extremity. She has 2 open wounds. She did not tolerate 3 layer compression we have therefore been using Tubigrip. We use dermacol last week but we have endoform to use this week 12/04/2021 upon evaluation today patient appears to be doing well with regard to her wound. There is still some irritation around where she has a small area that she bumped inferior to where the original wound was. This is not appearing to be too significant at this point which is good news. 12/07/2020 upon evaluation today patient appears to be doing well with regard to her wounds actually look much better even than earlier this week when I saw her. They are measuring smaller. Unfortunately she does appear to have some cellulitis of the leg in general. There is really nothing significant that I can culture here so I am just can have to treat prophylactically and see if we can get this under control. The patient voiced understanding and does realize what is going on as well as we discussed that today. 1/25; patient presents for follow-up. She reports finishing her course of antibiotics. She reports improvement in her symptoms. She is only able to tolerate the compression wrap for a few days before she takes this off herself. It is unclear if she is dressing the wound when she takes the [place], Shane[place] L. ([id number]) compression wraps off. She is scheduled for her venous reflux studies tomorrow. She currently denies signs of infection. 12/29/2021 upon evaluation today patient actually appears to be completely healed which is great news. She has been using her compression socks which is good. Fortunately I do not see any signs of active infection locally nor systemically at this time. Objective Constitutional Well-nourished and well-hydrated in no acute distress. Vitals Time Taken: 10:35 AM, Height: 63 in, Weight: 198 lbs, BMI: 35.1, Temperature: 97.5 F, Pulse: 76 bpm, Respiratory Rate: 18 breaths/min, Blood  Pressure: 161/86 mmHg. Respiratory normal breathing without difficulty. Psychiatric this patient is able to make decisions and demonstrates good insight into disease process. Alert and Oriented x 3. pleasant and cooperative. General Notes: Upon inspection patient's wound bed actually showed signs of complete epithelization I am actually very pleased with where we stand and she is doing well with her compression sock at this point. I think that we can continue as such with that I will probably have her continue with the dressings with the alginate and the border foam as well just to make sure everything is okay but overall I am extremely pleased. Integumentary (Hair, Skin)  Wound #1 status is Healed - Epithelialized. Original cause of wound was Skin Tear/Laceration. The date acquired was: 07/30/2021. The wound has been in treatment 17 weeks. The wound is located on the Left,Anterior Lower Leg. The wound measures 0cm length x 0cm width x 0cm depth; 0cm^2 area and 0cm^3 volume. There is Fat Layer (Subcutaneous Tissue) exposed. There is no tunneling or undermining noted. There is a medium amount of serosanguineous drainage noted. The wound margin is flat and intact. There is large (67-100%) pink, pale granulation within the wound bed. There is a small (1-33%) amount of necrotic tissue within the wound bed. Wound #2 status is Healed - Epithelialized. Original cause of wound was Trauma. The date acquired was: 12/04/2021. The wound has been in treatment 3 weeks. The wound is located on the Left,Distal Lower Leg. The wound measures 0cm length x 0cm width x 0cm depth; 0cm^2 area and 0cm^3 volume. There is no tunneling or undermining noted. There is a medium amount of serosanguineous drainage noted. There is no granulation within the wound bed. There is no necrotic tissue within the wound bed. Assessment Active Problems ICD-10 Venous insufficiency (chronic) (peripheral) Non-pressure chronic ulcer of other  part of left lower leg with fat layer exposed Fibromyalgia Bilateral primary osteoarthritis of knee Tinea corporis Rash and other nonspecific skin eruption Plan Discharge From [facility] Services: Wear compression garments daily. Put garments on first thing when you wake up and remove them before bed. - During the day and remove at bedtime and apply lotion (Eucerin) to leg Moisturize legs daily after removing compression garments. - continue dressing wound for 2 more weeks. Please call with any issues. Wound clinic will call Vascular to check on appointment with doctor, you were provided vascular number as well and please do call to check in on appointment as well. Elevate, Exercise Daily and Avoid Standing for Long Periods of Time. [place], [place] ([id number]) DO YOUR BEST to sleep in the bed at night. DO NOT sleep in your recliner. Long hours of sitting in a recliner leads to swelling of the legs and/or potential wounds on your backside. Bathing/ Shower/ Hygiene: May shower with wound dressing protected with water repellent cover or cast protector. No tub bath. Anesthetic (Use 'Patient Medications' Section for Anesthetic Order Entry): Lidocaine applied to wound bed Edema Control - Lymphedema / Segmental Compressive Device / Other: Patient to wear own compression stockings. Remove compression stockings every night before going to bed and put on every morning when getting up. - right leg Elevate, Exercise Daily and Avoid Standing for Long Periods of Time. Elevate legs to the level of the heart and pump ankles as often as possible Elevate leg(s) parallel to the floor when sitting. DO YOUR BEST to sleep in the bed at night. DO NOT sleep in your recliner. Long hours of sitting in a recliner leads to swelling of the legs and/or potential wounds on your backside. Additional Orders / Instructions: Follow Nutritious Diet and Increase Protein Intake 1. I would recommend her continuing to use the  compression sock which I think is doing a good job. 2. I am also can recommend that we have the patient continue with the silver alginate dressing and the border foam just for protection I think this is good and with her dementia this is something she at least knows to do I think that is good to be easier and try to switch things up on her to be honest. We will see her back for follow-up  visit as needed. Electronic Signature(s) Signed: 12/29/2021 11:12:36 AM By: Worthy Keeler PA-C Entered By: Worthy Keeler on 12/29/2021 11:12:36 Vellucci, [name] ([id number]) -------------------------------------------------------------------------------- SuperBill Details Patient Name: Birt, Sharnise L. Date of Service: 12/29/2021 Medical Record Number: [id number] Patient Account Number: [id number] Date of Birth/Sex: 05/03/1941 (81 y.o. 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

## 2022-01-01 NOTE — Progress Notes (Addendum)
[name], [name] ([id number]) Visit Report for 12/29/2021 Arrival Information Details Patient Name: [name], [name]. Date of Service: 12/29/2021 10:30 AM Medical Record Number: [id number] Patient Account Number: [id number] Date of Birth/Sex: 1941-10-24 (80 y.o. F) Treating RN: [name] Primary Care [name]: [name] Other Clinician: Referring [name]: [name] Treating [name]/Extender: [name] in Treatment: 17 Visit Information History Since Last Visit Added or deleted any medications: No Patient Arrived: [name] Any new allergies or adverse reactions: No Arrival Time: 10:32 Had a fall or experienced change in No Accompanied By: self activities of daily living that may affect Transfer Assistance: None risk of falls: Patient Identification Verified: Yes Hospitalized since last visit: No Secondary Verification Process Completed: Yes Has Dressing in Place as Prescribed: Yes Patient Requires Transmission-Based Precautions: No Has Compression in Place as Prescribed: Yes Patient Has Alerts: No Pain Present Now: No Electronic Signature(s) Signed: 01/01/2022 5:12:29 PM By: [name] Entered By: [name] on 12/29/2021 10:35:10 [name] ([id number]) -------------------------------------------------------------------------------- Clinic Level of Care Assessment Details Patient Name: [name], [name] Date of Service: 12/29/2021 10:30 AM Medical Record Number: [id number] Patient Account Number: [id number] Date of Birth/Sex: [birth year] (80 y.o. F) Treating RN: [name] Primary Care [name]: [name] Other Clinician: Referring [name]: [name] Treating [name]/Extender: [name] in Treatment: 17 Clinic Level of Care Assessment Items TOOL 4 Quantity Score []  - Use when only an EandM is performed on FOLLOW-UP visit 0 ASSESSMENTS - Nursing Assessment / Reassessment X - Reassessment of Co-morbidities  (includes updates in patient status) 1 10 X- 1 5 Reassessment of Adherence to Treatment Plan ASSESSMENTS - Wound and Skin Assessment / Reassessment X - Simple Wound Assessment / Reassessment - one wound 1 5 []  - 0 Complex Wound Assessment / Reassessment - multiple wounds []  - 0 Dermatologic / Skin Assessment (not related to wound area) ASSESSMENTS - Focused Assessment []  - Circumferential Edema Measurements - multi extremities 0 []  - 0 Nutritional Assessment / Counseling / Intervention []  - 0 Lower Extremity Assessment (monofilament, tuning fork, pulses) []  - 0 Peripheral Arterial Disease Assessment (using hand held doppler) ASSESSMENTS - Ostomy and/or Continence Assessment and Care []  - Incontinence Assessment and Management 0 []  - 0 Ostomy Care Assessment and Management (repouching, etc.) PROCESS - Coordination of Care X - Simple Patient / Family Education for ongoing care 1 15 []  - 0 Complex (extensive) Patient / Family Education for ongoing care []  - 0 Staff obtains Programmer, systems, Records, Test Results / Process Orders []  - 0 Staff telephones HHA, Nursing Homes / Clarify orders / etc []  - 0 Routine Transfer to another Facility (non-emergent condition) []  - 0 Routine Hospital Admission (non-emergent condition) []  - 0 New Admissions / Biomedical engineer / Ordering NPWT, Apligraf, etc. []  - 0 Emergency Hospital Admission (emergent condition) X- 1 10 Simple Discharge Coordination []  - 0 Complex (extensive) Discharge Coordination PROCESS - Special Needs []  - Pediatric / Minor Patient Management 0 []  - 0 Isolation Patient Management []  - 0 Hearing / Language / Visual special needs []  - 0 Assessment of Community assistance (transportation, D/C planning, etc.) []  - 0 Additional assistance / Altered mentation []  - 0 Support Surface(s) Assessment (bed, cushion, seat, etc.) INTERVENTIONS - Wound Cleansing / Measurement [name], [name] ([id number]) []  - 0 Simple  Wound Cleansing - one wound []  - 0 Complex Wound Cleansing - multiple wounds X- 1 5 Wound Imaging (photographs - any number of wounds) []  - 0 Wound Tracing (instead of photographs) []  -  0 Simple Wound Measurement - one wound []  - 0 Complex Wound Measurement - multiple wounds INTERVENTIONS - Wound Dressings []  - Small Wound Dressing one or multiple wounds 0 []  - 0 Medium Wound Dressing one or multiple wounds []  - 0 Large Wound Dressing one or multiple wounds []  - 0 Application of Medications - topical []  - 0 Application of Medications - injection INTERVENTIONS - Miscellaneous []  - External ear exam 0 []  - 0 Specimen Collection (cultures, biopsies, blood, body fluids, etc.) []  - 0 Specimen(s) / Culture(s) sent or taken to Lab for analysis []  - 0 Patient Transfer (multiple staff / Civil Service fast streamer / Similar devices) []  - 0 Simple Staple / Suture removal (25 or less) []  - 0 Complex Staple / Suture removal (26 or more) []  - 0 Hypo / Hyperglycemic Management (close monitor of Blood Glucose) []  - 0 Ankle / Brachial Index (ABI) - do not check if billed separately X- 1 5 Vital Signs Has the patient been seen at the hospital within the last three years: Yes Total Score: 55 Level Of Care: New/Established - Level 2 Electronic Signature(s) Signed: 01/02/2022 9:26:54 AM By: [name] Cool, BSN, RN, CWS, Kim RN, BSN Entered By: [name] on 01/02/2022 09:20:29 [name] ([id number]) -------------------------------------------------------------------------------- Encounter Discharge Information Details Patient Name: [name], [name] Date of Service: 12/29/2021 10:30 AM Medical Record Number: [id number] Patient Account Number: [id number] Date of Birth/Sex: 03-05-1941 (80 y.o. F) Treating RN: [name] Primary Care [name]: [name] Other Clinician: Referring [name]: [name] Treating [name]/Extender: [name] in Treatment: 17 Encounter  Discharge Information Items Discharge Condition: Stable Discharge Destination: Home Schedule Follow-up Appointment: No Clinical Summary of Care: Electronic Signature(s) Signed: 01/02/2022 9:21:18 AM By: [name] Cool, BSN, RN, CWS, Kim RN, BSN Entered By: [name] on 01/02/2022 09:21:18 [place], [name] ([id number]) -------------------------------------------------------------------------------- Lower Extremity Assessment Details Patient Name: [name], [name] Date of Service: 12/29/2021 10:30 AM Medical Record Number: [id number] Patient Account Number: [id number] Date of Birth/Sex: [birth year] (80 y.o. F) Treating RN: [name] Primary Care Jersie Beel: [name] Other Clinician: Referring Zephyra Bernardi: [name] Treating Aishia Barkey/Extender: Jeri Cos Weeks in Treatment: 17 Edema Assessment Assessed: Shirlyn Goltz: No] Patrice Paradise: No] Edema: [Left: Ye] [Right: s] Calf Left: Right: Point of Measurement: 31 cm From Medial Instep 41.5 cm Ankle Left: Right: Point of Measurement: 11 cm From Medial Instep 28 cm Vascular Assessment Pulses: Dorsalis Pedis Palpable: [Left:Yes] Posterior Tibial Palpable: [Left:Yes] Electronic Signature(s) Signed: 01/01/2022 5:12:29 PM By: [name] Entered By: [name] on 12/29/2021 10:47:09 [name] ([id number]) -------------------------------------------------------------------------------- Multi Wound Chart Details Patient Name: [name], [name] Date of Service: 12/29/2021 10:30 AM Medical Record Number: [id number] Patient Account Number: [id number] Date of Birth/Sex: 1940-11-27 (80 y.o. F) Treating RN: [name] Primary Care Jonel Sick: [name] Other Clinician: Referring Hardin Hardenbrook: [name] Treating Cheron Coryell/Extender: [name] in Treatment: 17 Vital Signs Height(in): 63 Pulse(bpm): 59 Weight(lbs): 198 Blood Pressure(mmHg): 161/86 Body Mass Index(BMI): 35.1 Temperature(F):  97.5 Respiratory Rate(breaths/min): 18 Photos: [N/A:N/A] Wound Location: Left, Anterior Lower Leg Left, Distal Lower Leg N/A Wounding Event: Skin Tear/Laceration Trauma N/A Primary Etiology: Venous Leg Ulcer Venous Leg Ulcer N/A Comorbid History: Cataracts, Osteoarthritis, Received Cataracts, Osteoarthritis, Received N/A Radiation Radiation Date Acquired: 07/30/2021 12/04/2021 N/A Weeks of Treatment: 17 3 N/A Wound Status: Healed - Epithelialized Healed - Epithelialized N/A Wound Recurrence: No No N/A Measurements L x W x D (cm) 0x0x0 0x0x0 N/A Area (cm) : 0 0 N/A Volume (cm) :  [name], [name] ([id number]) Visit Report for 12/29/2021 Arrival Information Details Patient Name: [name], [name]. Date of Service: 12/29/2021 10:30 AM Medical Record Number: [id number] Patient Account Number: [id number] Date of Birth/Sex: 1941-10-24 (80 y.o. F) Treating RN: [name] Primary Care [name]: [name] Other Clinician: Referring [name]: [name] Treating [name]/Extender: [name] in Treatment: 17 Visit Information History Since Last Visit Added or deleted any medications: No Patient Arrived: [name] Any new allergies or adverse reactions: No Arrival Time: 10:32 Had a fall or experienced change in No Accompanied By: self activities of daily living that may affect Transfer Assistance: None risk of falls: Patient Identification Verified: Yes Hospitalized since last visit: No Secondary Verification Process Completed: Yes Has Dressing in Place as Prescribed: Yes Patient Requires Transmission-Based Precautions: No Has Compression in Place as Prescribed: Yes Patient Has Alerts: No Pain Present Now: No Electronic Signature(s) Signed: 01/01/2022 5:12:29 PM By: [name] Entered By: [name] on 12/29/2021 10:35:10 [name] ([id number]) -------------------------------------------------------------------------------- Clinic Level of Care Assessment Details Patient Name: [name], [name] Date of Service: 12/29/2021 10:30 AM Medical Record Number: [id number] Patient Account Number: [id number] Date of Birth/Sex: [birth year] (80 y.o. F) Treating RN: [name] Primary Care [name]: [name] Other Clinician: Referring [name]: [name] Treating [name]/Extender: [name] in Treatment: 17 Clinic Level of Care Assessment Items TOOL 4 Quantity Score []  - Use when only an EandM is performed on FOLLOW-UP visit 0 ASSESSMENTS - Nursing Assessment / Reassessment X - Reassessment of Co-morbidities  (includes updates in patient status) 1 10 X- 1 5 Reassessment of Adherence to Treatment Plan ASSESSMENTS - Wound and Skin Assessment / Reassessment X - Simple Wound Assessment / Reassessment - one wound 1 5 []  - 0 Complex Wound Assessment / Reassessment - multiple wounds []  - 0 Dermatologic / Skin Assessment (not related to wound area) ASSESSMENTS - Focused Assessment []  - Circumferential Edema Measurements - multi extremities 0 []  - 0 Nutritional Assessment / Counseling / Intervention []  - 0 Lower Extremity Assessment (monofilament, tuning fork, pulses) []  - 0 Peripheral Arterial Disease Assessment (using hand held doppler) ASSESSMENTS - Ostomy and/or Continence Assessment and Care []  - Incontinence Assessment and Management 0 []  - 0 Ostomy Care Assessment and Management (repouching, etc.) PROCESS - Coordination of Care X - Simple Patient / Family Education for ongoing care 1 15 []  - 0 Complex (extensive) Patient / Family Education for ongoing care []  - 0 Staff obtains Programmer, systems, Records, Test Results / Process Orders []  - 0 Staff telephones HHA, Nursing Homes / Clarify orders / etc []  - 0 Routine Transfer to another Facility (non-emergent condition) []  - 0 Routine Hospital Admission (non-emergent condition) []  - 0 New Admissions / Biomedical engineer / Ordering NPWT, Apligraf, etc. []  - 0 Emergency Hospital Admission (emergent condition) X- 1 10 Simple Discharge Coordination []  - 0 Complex (extensive) Discharge Coordination PROCESS - Special Needs []  - Pediatric / Minor Patient Management 0 []  - 0 Isolation Patient Management []  - 0 Hearing / Language / Visual special needs []  - 0 Assessment of Community assistance (transportation, D/C planning, etc.) []  - 0 Additional assistance / Altered mentation []  - 0 Support Surface(s) Assessment (bed, cushion, seat, etc.) INTERVENTIONS - Wound Cleansing / Measurement [name], [name] ([id number]) []  - 0 Simple  Wound Cleansing - one wound []  - 0 Complex Wound Cleansing - multiple wounds X- 1 5 Wound Imaging (photographs - any number of wounds) []  - 0 Wound Tracing (instead of photographs) []  -  0 Simple Wound Measurement - one wound []  - 0 Complex Wound Measurement - multiple wounds INTERVENTIONS - Wound Dressings []  - Small Wound Dressing one or multiple wounds 0 []  - 0 Medium Wound Dressing one or multiple wounds []  - 0 Large Wound Dressing one or multiple wounds []  - 0 Application of Medications - topical []  - 0 Application of Medications - injection INTERVENTIONS - Miscellaneous []  - External ear exam 0 []  - 0 Specimen Collection (cultures, biopsies, blood, body fluids, etc.) []  - 0 Specimen(s) / Culture(s) sent or taken to Lab for analysis []  - 0 Patient Transfer (multiple staff / Civil Service fast streamer / Similar devices) []  - 0 Simple Staple / Suture removal (25 or less) []  - 0 Complex Staple / Suture removal (26 or more) []  - 0 Hypo / Hyperglycemic Management (close monitor of Blood Glucose) []  - 0 Ankle / Brachial Index (ABI) - do not check if billed separately X- 1 5 Vital Signs Has the patient been seen at the hospital within the last three years: Yes Total Score: 55 Level Of Care: New/Established - Level 2 Electronic Signature(s) Signed: 01/02/2022 9:26:54 AM By: [name] Cool, BSN, RN, CWS, Kim RN, BSN Entered By: [name] on 01/02/2022 09:20:29 [name] ([id number]) -------------------------------------------------------------------------------- Encounter Discharge Information Details Patient Name: [name], [name] Date of Service: 12/29/2021 10:30 AM Medical Record Number: [id number] Patient Account Number: [id number] Date of Birth/Sex: 03-05-1941 (80 y.o. F) Treating RN: [name] Primary Care [name]: [name] Other Clinician: Referring [name]: [name] Treating [name]/Extender: [name] in Treatment: 17 Encounter  Discharge Information Items Discharge Condition: Stable Discharge Destination: Home Schedule Follow-up Appointment: No Clinical Summary of Care: Electronic Signature(s) Signed: 01/02/2022 9:21:18 AM By: [name] Cool, BSN, RN, CWS, Kim RN, BSN Entered By: [name] on 01/02/2022 09:21:18 [place], [name] ([id number]) -------------------------------------------------------------------------------- Lower Extremity Assessment Details Patient Name: [name], [name] Date of Service: 12/29/2021 10:30 AM Medical Record Number: [id number] Patient Account Number: [id number] Date of Birth/Sex: [birth year] (80 y.o. F) Treating RN: [name] Primary Care Jersie Beel: [name] Other Clinician: Referring Zephyra Bernardi: [name] Treating Aishia Barkey/Extender: Jeri Cos Weeks in Treatment: 17 Edema Assessment Assessed: Shirlyn Goltz: No] Patrice Paradise: No] Edema: [Left: Ye] [Right: s] Calf Left: Right: Point of Measurement: 31 cm From Medial Instep 41.5 cm Ankle Left: Right: Point of Measurement: 11 cm From Medial Instep 28 cm Vascular Assessment Pulses: Dorsalis Pedis Palpable: [Left:Yes] Posterior Tibial Palpable: [Left:Yes] Electronic Signature(s) Signed: 01/01/2022 5:12:29 PM By: [name] Entered By: [name] on 12/29/2021 10:47:09 [name] ([id number]) -------------------------------------------------------------------------------- Multi Wound Chart Details Patient Name: [name], [name] Date of Service: 12/29/2021 10:30 AM Medical Record Number: [id number] Patient Account Number: [id number] Date of Birth/Sex: 1940-11-27 (80 y.o. F) Treating RN: [name] Primary Care Jonel Sick: [name] Other Clinician: Referring Hardin Hardenbrook: [name] Treating Cheron Coryell/Extender: [name] in Treatment: 17 Vital Signs Height(in): 63 Pulse(bpm): 59 Weight(lbs): 198 Blood Pressure(mmHg): 161/86 Body Mass Index(BMI): 35.1 Temperature(F):  97.5 Respiratory Rate(breaths/min): 18 Photos: [N/A:N/A] Wound Location: Left, Anterior Lower Leg Left, Distal Lower Leg N/A Wounding Event: Skin Tear/Laceration Trauma N/A Primary Etiology: Venous Leg Ulcer Venous Leg Ulcer N/A Comorbid History: Cataracts, Osteoarthritis, Received Cataracts, Osteoarthritis, Received N/A Radiation Radiation Date Acquired: 07/30/2021 12/04/2021 N/A Weeks of Treatment: 17 3 N/A Wound Status: Healed - Epithelialized Healed - Epithelialized N/A Wound Recurrence: No No N/A Measurements L x W x D (cm) 0x0x0 0x0x0 N/A Area (cm) : 0 0 N/A Volume (cm) :

## 2022-01-02 ENCOUNTER — Ambulatory Visit: Payer: PPO

## 2022-01-05 ENCOUNTER — Ambulatory Visit: Payer: PPO | Admitting: Physician Assistant

## 2022-01-11 ENCOUNTER — Ambulatory Visit (INDEPENDENT_AMBULATORY_CARE_PROVIDER_SITE_OTHER): Payer: PPO | Admitting: Nurse Practitioner

## 2022-01-11 ENCOUNTER — Other Ambulatory Visit: Payer: Self-pay

## 2022-01-11 ENCOUNTER — Telehealth: Payer: Self-pay

## 2022-01-11 ENCOUNTER — Encounter (INDEPENDENT_AMBULATORY_CARE_PROVIDER_SITE_OTHER): Payer: PPO | Admitting: Vascular Surgery

## 2022-01-11 VITALS — BP 160/92 | HR 85 | Temp 97.9°F | Resp 12 | Ht 64.0 in | Wt 192.6 lb

## 2022-01-11 DIAGNOSIS — R21 Rash and other nonspecific skin eruption: Secondary | ICD-10-CM | POA: Diagnosis not present

## 2022-01-11 MED ORDER — METHYLPREDNISOLONE ACETATE 40 MG/ML IJ SUSP
40.0000 mg | Freq: Once | INTRAMUSCULAR | Status: AC
  Administered 2022-01-11: 40 mg via INTRAMUSCULAR

## 2022-01-11 MED ORDER — FAMOTIDINE 20 MG PO TABS: 20.0000 mg | ORAL_TABLET | Freq: Two times a day (BID) | ORAL | 0 refills | Status: DC

## 2022-01-11 MED ORDER — PREDNISONE 20 MG PO TABS: ORAL_TABLET | ORAL | 0 refills | Status: AC

## 2022-01-11 NOTE — Assessment & Plan Note (Signed)
Patient has a widespread macular erythematous rash to anterior and posterior trunk.  Patient is a 67 with a water supply and she was not taking full showers due to being treated by the wound clinic and over the past few days started taking for showers and started having the breakout of the rash on her trunk.  Does have some on bilateral upper extremities.  Has tried over-the-counter Claritin and Benadryl without relief.  Will administer Depo-Medrol 40 mg IM x1 dose and write a prednisone taper for patient to take while at home encouraged her to continue taking her Claritin.  Discouraged Benadryl use and will add Pepcid twice daily for 7 days.  Did review signs and symptoms when to seek urgent or emergent health care.  Return to clinic precautions discussed

## 2022-01-11 NOTE — Progress Notes (Signed)
Acute Office Visit  Subjective:    Patient ID: [name], female    DOB: Jan 31, 1941, 81 y.o.   MRN: [id number]  Chief Complaint  Patient presents with   Rash    X 2 days, worse yesterday, a lot of itching, slight rash in the upper chest area, back and abdominal pain. No changes in soap or detergent. Has taking Claritin and Benadryl which did not help.    Rash Pertinent negatives include no cough, diarrhea, fever, shortness of breath or vomiting.  Patient is in today for Rash  States started about 2 days ago. States that it is on the front of her chest and neck and belly. States that her husband is having something similar and she thinks it is dealign with erh water. States she has had plumbers and the water has been a brown color. States that she has tried benadryl and claritin and has not helped much. Changed mattress around 6 months or so ago.   States that she was with the wound cetner and was doing sponge baths. She started taking a shower a few days ago and then had the break out  Past Medical History:  Diagnosis Date   Anxiety    Arthritis    Breast cancer ([place]) 2001   s/p rady and lumpectomy   Carcinoma in situ of breast 2001   [place]   Esophageal reflux    Fatty liver    Fibromyalgia    Hx of adenomatous colonic polyps    Labyrinthitis, unspecified    Pure hypercholesterolemia    Unspecified essential hypertension    Urinary tract infection, site not specified     Past Surgical History:  Procedure Laterality Date   BREAST BIOPSY  07/2000   positive right side, [facility]    BREAST LUMPECTOMY  08/20/2000   right breast, malignant stage I, [facility]   CATARACT EXTRACTION  1988, [place]   minor     Family History  Problem Relation Age of Onset   Prostate cancer Brother    Heart disease Brother    Pancreatitis Brother        x 2   Prostate cancer Brother    Alzheimer's disease Father    Dementia Father     Parkinsonism Mother    Heart disease Brother    Heart disease Brother    Breast cancer Paternal Aunt    Colon cancer Neg Hx     Social History   Socioeconomic History   Marital status: Married    Spouse name: Not on file   Number of children: Not on file   Years of education: Not on file   Highest education level: Not on file  Occupational History   Occupation: Housewife  Tobacco Use   Smoking status: Never   Smokeless tobacco: Never  Vaping Use   Vaping Use: Never used  Substance and Sexual Activity   Alcohol use: No    Alcohol/week: 0.0 standard drinks   Drug use: No   Sexual activity: Not Currently  Other Topics Concern   Not on file  Social History Narrative   Patient does not get regular exercise   Daily caffeine-one per day   Married 1958   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     ibuprofen (ADVIL) 200 MG tablet Take 1-2 tablets (200-400 mg total) by mouth every 8 (eight) hours as needed (with food, for knee pain).     LORazepam (ATIVAN) 1 MG tablet Take 1/4-1/2 tablet by mouth every 8 hours as needed 30 tablet 0   Multiple Vitamins-Minerals (PRESERVISION AREDS 2 PO) Take by mouth.     triamcinolone cream (KENALOG) 0.1 % Apply 1 application topically 2 (two) times daily. On the right leg 454 g 0   No facility-administered medications prior to visit.    Allergies  Allergen Reactions   Lasix [Furosemide]     Rash   Lipitor [Atorvastatin]     intolerant   Lisinopril Other (See Comments)    Intolerant, abd pain   Metoprolol  Succinate     REACTION: Exhaustion, increased dizziness   Nitrofurantoin     REACTION: Rash   Sulfadiazine     REACTION: Rash   Yellow Jacket Venom [Bee Venom]     Short of breath    Review of Systems  Constitutional:  Negative for chills and fever.  HENT:  Negative for trouble swallowing and voice change.   Respiratory:  Negative for cough and shortness of breath.   Gastrointestinal:  Negative for diarrhea, nausea and vomiting.  Skin:  Positive for color change and rash.       Pruritus       Objective:    Physical Exam Cardiovascular:     Rate and Rhythm: Normal rate and regular rhythm.     Heart sounds: Normal heart sounds.  Pulmonary:     Effort: Pulmonary effort is normal.     Breath sounds: Normal breath sounds.  Abdominal:     General: Bowel sounds are normal.  Skin:    General: Skin is warm.     Findings: Rash present. Rash is macular.          Comments: Wide spread macular rash to posterior and anterior truncal region     BP (!) 160/92    Pulse 85    Temp 97.9 F (36.6 C)    Resp 12    Ht 5\' 4"  (1.626 m)    Wt 192 lb 9 oz (87.3 kg)    SpO2 99%    BMI 33.05 kg/m  Wt Readings from Last 3 Encounters:  01/11/22 192 lb 9 oz (87.3 kg)  11/06/21 194 lb 6 oz (88.2 kg)  10/12/21 200 lb (90.7 kg)    Health Maintenance Due  Topic Date Due   Zoster Vaccines- Shingrix (1 of 2) Never done   COVID-19 Vaccine (4 - Booster for Pfizer series) 12/21/2020    There are no preventive care reminders to display for this patient.   Lab Results  Component Value Date   TSH 3.99 06/09/2018   Lab Results  Component Value Date   WBC 8.9 07/30/2021   HGB 14.1 07/30/2021   HCT 41.8 07/30/2021   MCV 91.5 07/30/2021   PLT 237 07/30/2021   Lab Results  Component Value Date   NA 142 07/30/2021   K 4.1 07/30/2021   CO2 27 07/30/2021   GLUCOSE 117 (H) 07/30/2021   BUN 11 07/30/2021  CREATININE 0.76 07/30/2021   BILITOT 1.5 (H) 07/30/2021   ALKPHOS 103 07/30/2021   AST  15 07/30/2021   ALT 14 07/30/2021   PROT 7.1 07/30/2021   ALBUMIN 4.4 07/30/2021   CALCIUM 9.3 07/30/2021   ANIONGAP 8 07/30/2021   GFR 73.13 04/06/2020   Lab Results  Component Value Date   CHOL 214 (H) 04/06/2020   Lab Results  Component Value Date   HDL 45.40 04/06/2020   Lab Results  Component Value Date   LDLCALC 136 (H) 04/06/2020   Lab Results  Component Value Date   TRIG 162.0 (H) 04/06/2020   Lab Results  Component Value Date   CHOLHDL 5 04/06/2020   Lab Results  Component Value Date   HGBA1C 5.5 09/05/2018       Assessment & Plan:   Problem List Items Addressed This Visit       Musculoskeletal and Integument   Rash - Primary    Patient has a widespread macular erythematous rash to anterior and posterior trunk.  Patient is a 66 with a water supply and she was not taking full showers due to being treated by the wound clinic and over the past few days started taking for showers and started having the breakout of the rash on her trunk.  Does have some on bilateral upper extremities.  Has tried over-the-counter Claritin and Benadryl without relief.  Will administer Depo-Medrol 40 mg IM x1 dose and write a prednisone taper for patient to take while at home encouraged her to continue taking her Claritin.  Discouraged Benadryl use and will add Pepcid twice daily for 7 days.  Did review signs and symptoms when to seek urgent or emergent health care.  Return to clinic precautions discussed      Relevant Medications   predniSONE (DELTASONE) 20 MG tablet   famotidine (PEPCID) 20 MG tablet     Meds ordered this encounter  Medications   methylPREDNISolone acetate (DEPO-MEDROL) injection 40 mg   predniSONE (DELTASONE) 20 MG tablet    Sig: Take 1 tablet (20 mg total) by mouth 2 (two) times daily with a meal for 3 days, THEN 1 tablet (20 mg total) daily with breakfast for 3 days. Do NOT take with NSAIDs like: ibuprofen, Aleve, Motrin, Naproxen, BC/Goody powders. Start  01/12/2022.    Dispense:  9 tablet    Refill:  0    Order Specific Question:   Supervising Provider    Answer:   Loura Pardon A [[id number]]   famotidine (PEPCID) 20 MG tablet    Sig: Take 1 tablet (20 mg total) by mouth 2 (two) times daily for 7 days.    Dispense:  14 tablet    Refill:  0    Order Specific Question:   Supervising Provider    Answer:   Loura Pardon A [[id number]]   This visit occurred during the SARS-CoV-2 public health emergency.  Safety protocols were in place, including screening questions prior to the visit, additional usage of staff PPE, and extensive cleaning of exam room while observing appropriate contact time as indicated for disinfecting solutions.    Romilda Garret, NP

## 2022-01-11 NOTE — Patient Instructions (Signed)
Nice to see you today I sent in a prescription for the steroids that I want you to start taking tomorrow 01/12/2022. You can start the Pepcid (famotidine) tonight if you want. Continue using the Claritin daily. Follow up if symptoms do not improve or worsen.

## 2022-01-11 NOTE — Telephone Encounter (Signed)
I spoke with pt; pt has fine rash on upper body with a lot of itching. Pt said no other symptoms at this time. No fever and no other covid symptoms. No new soap, deodorant, laundry detergent,clothes or furniture. Pt does not have swelling in face, throat or neck and no difficulty breathing. Pt scheduled appt with Romilda Garret NP 01/11/22 at 3 pm with UC & ED precautions and pt voiced understanding.Sending note to Romilda Garret NP and Anastasiya CMA.

## 2022-01-11 NOTE — Telephone Encounter (Signed)
[facility] Night - Client Nonclinical Telephone Record  [facility] Client [facility] Night - Client Client Site [facility] - Night Provider Renford Dills - MD Contact Type Call Who Is Calling Patient / Member / Family / Caregiver Caller Name [facility] Phone Number [phone] Patient Name [name] Patient DOB 1941-07-24 Call Type Message Only Information Provided Reason for Call Request to Schedule Office Appointment Initial Comment Caller states that she is itching and broke out in a rash and she wants to be seen today. Declined triage. Patient request to speak to RN No Disp. Time Disposition Final User 01/11/2022 7:59:33 AM General Information Provided Yes Windy Canny Call Closed By: Windy Canny Transaction Date/Time: 01/11/2022 7:57:17 AM (ET

## 2022-01-11 NOTE — Telephone Encounter (Signed)
Noted. Will see at office visit

## 2022-01-17 ENCOUNTER — Other Ambulatory Visit: Payer: Self-pay

## 2022-01-17 ENCOUNTER — Ambulatory Visit (INDEPENDENT_AMBULATORY_CARE_PROVIDER_SITE_OTHER): Payer: PPO | Admitting: Family Medicine

## 2022-01-17 ENCOUNTER — Encounter: Payer: Self-pay | Admitting: Family Medicine

## 2022-01-17 VITALS — BP 140/80 | HR 81 | Temp 98.5°F | Ht 64.0 in | Wt 191.4 lb

## 2022-01-17 DIAGNOSIS — M1712 Unilateral primary osteoarthritis, left knee: Secondary | ICD-10-CM | POA: Diagnosis not present

## 2022-01-17 MED ORDER — TRIAMCINOLONE ACETONIDE 40 MG/ML IJ SUSP
40.0000 mg | Freq: Once | INTRAMUSCULAR | Status: AC
  Administered 2022-01-17: 40 mg via INTRA_ARTICULAR

## 2022-01-17 NOTE — Progress Notes (Signed)
[name] Calbert T. Ardel Jagger, MD, CAQ Sports Medicine [facility] [address] [place], [address]  Phone: [phone]   FAX: [phone]  [name] - 81 y.o. female   MRN [id number]   Date of Birth: 1941-03-23  Date: 01/17/2022   PCP: Joaquim Nam, MD   Referral: Joaquim Nam, MD  Chief Complaint  Patient presents with   Knee Pain    Left    This visit occurred during the SARS-CoV-2 public health emergency.  Safety protocols were in place, including screening questions prior to the visit, additional usage of staff PPE, and extensive cleaning of exam room while observing appropriate contact time as indicated for disinfecting solutions.   Subjective:   [name] is a 81 y.o. very pleasant female patient with Body mass index is 32.85 kg/m. who presents with the following:  She is here to talk about left-sided knee pain.  Left knee, severe osteoarthritis.  Procedure only  Aspiration/Injection Procedure Note JAYNA GILFILLAN 1941-08-27 Date of procedure: 01/17/2022  Procedure: Large Joint Aspiration / Injection of Knee, L Indications: Pain  Procedure Details Patient verbally consented to procedure. Risks, benefits, and alternatives explained. Sterilely prepped with Chloraprep. Ethyl cholride used for anesthesia. 9 cc Lidocaine 1% mixed with 1 mL of Kenalog 40 mg injected using the anteromedial approach without difficulty. No complications with procedure and tolerated well. Patient had decreased pain post-injection. Medication: 1 mL of Kenalog 40 mg     ICD-10-CM   1. Primary osteoarthritis of left knee  M17.12 triamcinolone acetonide (KENALOG-40) injection 40 mg      Meds ordered this encounter  Medications   triamcinolone acetonide (KENALOG-40) injection 40 mg   There are no discontinued medications. No orders of the defined types were placed in this encounter.   Follow-up: No follow-ups on file.  Occupational hygienist

## 2022-01-29 ENCOUNTER — Telehealth: Payer: Self-pay | Admitting: Family Medicine

## 2022-01-29 NOTE — Telephone Encounter (Signed)
Called and spoke with patient and scheduled her with Romilda Garret, NP tomorrow at 8 am.  ?

## 2022-01-29 NOTE — Telephone Encounter (Signed)
Pt called stating that she is burning when urinating and would like something called in. There are no appts today. Please advise. ?

## 2022-01-29 NOTE — Telephone Encounter (Signed)
Patient is calling to follow up on message left this AM. Patient is requesting a call. Patient is aware GSD is in office seeing patient. Please advise

## 2022-01-30 ENCOUNTER — Ambulatory Visit (INDEPENDENT_AMBULATORY_CARE_PROVIDER_SITE_OTHER): Payer: PPO | Admitting: Nurse Practitioner

## 2022-01-30 ENCOUNTER — Other Ambulatory Visit: Payer: Self-pay

## 2022-01-30 VITALS — BP 134/82 | HR 65 | Temp 97.5°F | Resp 12 | Ht 64.0 in | Wt 193.5 lb

## 2022-01-30 DIAGNOSIS — N3000 Acute cystitis without hematuria: Secondary | ICD-10-CM | POA: Diagnosis not present

## 2022-01-30 DIAGNOSIS — R300 Dysuria: Secondary | ICD-10-CM | POA: Diagnosis not present

## 2022-01-30 LAB — POCT URINALYSIS DIPSTICK
Bilirubin, UA: NEGATIVE
Blood, UA: NEGATIVE
Glucose, UA: NEGATIVE
Ketones, UA: NEGATIVE
Nitrite, UA: NEGATIVE
Protein, UA: NEGATIVE
Spec Grav, UA: 1.015 (ref 1.010–1.025)
Urobilinogen, UA: 0.2 E.U./dL
pH, UA: 7 (ref 5.0–8.0)

## 2022-01-30 MED ORDER — CEPHALEXIN 500 MG PO CAPS: 500.0000 mg | ORAL_CAPSULE | Freq: Three times a day (TID) | ORAL | 0 refills | Status: AC

## 2022-01-30 NOTE — Assessment & Plan Note (Signed)
Patient history of recurrent UTIs.  She does have some leukocytes in urine today we will go ahead and treat with Keflex 500 mg 3 times daily for 7 days.  Pending urinary culture ?

## 2022-01-30 NOTE — Progress Notes (Signed)
? ?Acute Office Visit ? ?Subjective:  ? ? Patient ID: [name], female    DOB: 1941/05/09, 81 y.o.   MRN: [id number] ? ?Chief Complaint  ?Patient presents with  ? Burning with urination  ?  Sx started about 4 days ago. Urgency and frequency to urinate, feeling malaise. No fever or nausea.  ? ? ?HPI ?Patient is in today for Urinary sympotms ? ?Symptoms started approx 4 days ago. States that she is having dysuira. States that it burns so bad that she "draws" and feels the burning in her hands. Experiencing frequency and urgency. Also states No hematuria ?States that she has been drinking cranberry juice and it seems to be helpful ? ?Past Medical History:  ?Diagnosis Date  ? Anxiety   ? Arthritis   ? Breast cancer ([place]) 2001  ? s/p rady and lumpectomy  ? Carcinoma in situ of breast 2001  ? Cataract 1988, 1990  ? [place]  ? Esophageal reflux   ? Fatty liver   ? Fibromyalgia   ? Hx of adenomatous colonic polyps   ? Labyrinthitis, unspecified   ? Pure hypercholesterolemia   ? Unspecified essential hypertension   ? Urinary tract infection, site not specified   ? ? ?Past Surgical History:  ?Procedure Laterality Date  ? BREAST BIOPSY  07/2000  ? positive right side, [facility]   ? BREAST LUMPECTOMY  08/20/2000  ? right breast, malignant stage I, [facility]  ? CATARACT EXTRACTION  1988, 1990  ? FOOT SURGERY  1980  ? minor   ? ? ?Family History  ?Problem Relation Age of Onset  ? Prostate cancer Brother   ? Heart disease Brother   ? Pancreatitis Brother   ?     x 2  ? Prostate cancer Brother   ? Alzheimer's disease Father   ? Dementia Father   ? Parkinsonism Mother   ? Heart disease Brother   ? Heart disease Brother   ? Breast cancer Paternal Aunt   ? Colon cancer Neg Hx   ? ? ?Social History  ? ?Socioeconomic History  ? Marital status: Married  ?  Spouse name: Not on file  ? Number of children: Not on file  ? Years of education: Not on file  ? Highest education level: Not on file  ?Occupational History  ? Occupation:  Housewife  ?Tobacco Use  ? Smoking status: Never  ? Smokeless tobacco: Never  ?Vaping Use  ? Vaping Use: Never used  ?Substance and Sexual Activity  ? Alcohol use: No  ?  Alcohol/week: 0.0 standard drinks  ? Drug use: No  ? Sexual activity: Not Currently  ?Other Topics Concern  ? Not on file  ?Social History Narrative  ? Patient does not get regular exercise  ? Daily caffeine-one per day  ? Married 1958  ? Retired from [employer]  ? ?Social Determinants of Health  ? ?Financial Resource Strain: Not on file  ?Food Insecurity: Not on file  ?Transportation Needs: Not on file  ?Physical Activity: Not on file  ?Stress: Not on file  ?Social Connections: Not on file  ?Intimate Partner Violence: Not on file  ? ? ?Outpatient Medications Prior to Visit  ?Medication Sig Dispense Refill  ? Multiple Vitamins-Minerals (PRESERVISION AREDS 2 PO) Take by mouth.    ? acetaminophen (TYLENOL) 325 MG tablet Take 2 tablets (650 mg total) by mouth 3 (three) times daily as needed. (Patient not taking: Reported on 01/30/2022)    ? ibuprofen (ADVIL) 200 MG tablet  Take 1-2 tablets (200-400 mg total) by mouth every 8 (eight) hours as needed (with food, for knee pain). (Patient not taking: Reported on 01/30/2022)    ? LORazepam (ATIVAN) 1 MG tablet Take 1/4-1/2 tablet by mouth every 8 hours as needed (Patient not taking: Reported on 01/30/2022) 30 tablet 0  ? triamcinolone cream (KENALOG) 0.1 % Apply 1 application topically 2 (two) times daily. On the right leg (Patient not taking: Reported on 01/30/2022) 454 g 0  ? famotidine (PEPCID) 20 MG tablet Take 1 tablet (20 mg total) by mouth 2 (two) times daily for 7 days. 14 tablet 0  ? ?No facility-administered medications prior to visit.  ? ? ?Allergies  ?Allergen Reactions  ? Lasix [Furosemide]   ?  Rash  ? Lipitor [Atorvastatin]   ?  intolerant  ? Lisinopril Other (See Comments)  ?  Intolerant, abd pain  ? Metoprolol Succinate   ?  REACTION: Exhaustion, increased dizziness  ? Nitrofurantoin   ?  REACTION:  Rash  ? Sulfadiazine   ?  REACTION: Rash  ? Yellow Jacket Venom [Bee Venom]   ?  Short of breath  ? ? ?Review of Systems  ?Constitutional:  Negative for chills and fever.  ?Gastrointestinal:  Negative for abdominal pain, nausea and vomiting.  ?Genitourinary:  Positive for dysuria, frequency and urgency. Negative for difficulty urinating.  ? ?   ?Objective:  ?  ?Physical Exam ?Vitals and nursing note reviewed.  ?Constitutional:   ?   Appearance: Normal appearance.  ?Cardiovascular:  ?   Rate and Rhythm: Normal rate and regular rhythm.  ?   Heart sounds: Normal heart sounds.  ?Pulmonary:  ?   Effort: Pulmonary effort is normal.  ?   Breath sounds: Normal breath sounds.  ?Abdominal:  ?   General: Bowel sounds are normal. There is no distension.  ?   Palpations: There is no mass.  ?   Tenderness: There is no abdominal tenderness. There is no right CVA tenderness or left CVA tenderness.  ?Neurological:  ?   Mental Status: She is alert.  ? ? ?BP 134/82   Pulse 65   Temp (!) 97.5 ?F (36.4 ?C)   Resp 12   Ht '5\' 4"'$  (1.626 m)   Wt 193 lb 8 oz (87.8 kg)   SpO2 100%   BMI 33.21 kg/m?  ?Wt Readings from Last 3 Encounters:  ?01/30/22 193 lb 8 oz (87.8 kg)  ?01/17/22 191 lb 6 oz (86.8 kg)  ?01/11/22 192 lb 9 oz (87.3 kg)  ? ? ?Health Maintenance Due  ?Topic Date Due  ? Zoster Vaccines- Shingrix (1 of 2) Never done  ? COVID-19 Vaccine (4 - Booster for Pfizer series) 12/21/2020  ? ? ?There are no preventive care reminders to display for this patient. ? ? ?Lab Results  ?Component Value Date  ? TSH 3.99 06/09/2018  ? ?Lab Results  ?Component Value Date  ? WBC 8.9 07/30/2021  ? HGB 14.1 07/30/2021  ? HCT 41.8 07/30/2021  ? MCV 91.5 07/30/2021  ? PLT 237 07/30/2021  ? ?Lab Results  ?Component Value Date  ? NA 142 07/30/2021  ? K 4.1 07/30/2021  ? CO2 27 07/30/2021  ? GLUCOSE 117 (H) 07/30/2021  ? BUN 11 07/30/2021  ? CREATININE 0.76 07/30/2021  ? BILITOT 1.5 (H) 07/30/2021  ? ALKPHOS 103 07/30/2021  ? AST 15 07/30/2021  ? ALT 14  07/30/2021  ? PROT 7.1 07/30/2021  ? ALBUMIN 4.4 07/30/2021  ? CALCIUM 9.3  07/30/2021  ? ANIONGAP 8 07/30/2021  ? GFR 73.13 04/06/2020  ? ?Lab Results  ?Component Value Date  ? CHOL 214 (H) 04/06/2020  ? ?Lab Results  ?Component Value Date  ? HDL 45.40 04/06/2020  ? ?Lab Results  ?Component Value Date  ? LDLCALC 136 (H) 04/06/2020  ? ?Lab Results  ?Component Value Date  ? TRIG 162.0 (H) 04/06/2020  ? ?Lab Results  ?Component Value Date  ? CHOLHDL 5 04/06/2020  ? ?Lab Results  ?Component Value Date  ? HGBA1C 5.5 09/05/2018  ? ? ?   ?Assessment & Plan:  ? ?Problem List Items Addressed This Visit   ? ?  ? Genitourinary  ? URINARY TRACT INFECTION, RECURRENT  ?  History of recurrent UTIs.  Patient presents with classic symptoms.  UA did show leukocytes treat with Keflex 3 times daily for 7 days.  Pending urinary culture follow-up if no improvement ?  ?  ? Relevant Medications  ? cephALEXin (KEFLEX) 500 MG capsule  ?  ? Other  ? Burning with urination - Primary  ?  Patient history of recurrent UTIs.  She does have some leukocytes in urine today we will go ahead and treat with Keflex 500 mg 3 times daily for 7 days.  Pending urinary culture ?  ?  ? Relevant Orders  ? POCT urinalysis dipstick (Completed)  ? Urine Culture  ? ? ? ?No orders of the defined types were placed in this encounter. ? ?This visit occurred during the SARS-CoV-2 public health emergency.  Safety protocols were in place, including screening questions prior to the visit, additional usage of staff PPE, and extensive cleaning of exam room while observing appropriate contact time as indicated for disinfecting solutions.  ? ? ?Romilda Garret, NP ? ?

## 2022-01-30 NOTE — Assessment & Plan Note (Signed)
History of recurrent UTIs.  Patient presents with classic symptoms.  UA did show leukocytes treat with Keflex 3 times daily for 7 days.  Pending urinary culture follow-up if no improvement ?

## 2022-01-30 NOTE — Patient Instructions (Signed)
Nice to see you today ?To do the E visits you need to activate your mychart portal ?Follow up if symptoms do not improve or you get worse ? ?

## 2022-01-31 LAB — URINE CULTURE
MICRO NUMBER:: 13098231
SPECIMEN QUALITY:: ADEQUATE

## 2022-04-16 ENCOUNTER — Ambulatory Visit (INDEPENDENT_AMBULATORY_CARE_PROVIDER_SITE_OTHER): Payer: PPO | Admitting: Family Medicine

## 2022-04-16 ENCOUNTER — Encounter: Payer: Self-pay | Admitting: Family Medicine

## 2022-04-16 VITALS — BP 148/74 | HR 76 | Temp 97.8°F | Ht 64.0 in | Wt 197.0 lb

## 2022-04-16 DIAGNOSIS — M170 Bilateral primary osteoarthritis of knee: Secondary | ICD-10-CM

## 2022-04-16 MED ORDER — TRIAMCINOLONE ACETONIDE 40 MG/ML IJ SUSP
40.0000 mg | Freq: Once | INTRAMUSCULAR | Status: AC
  Administered 2022-04-16: 40 mg via INTRA_ARTICULAR

## 2022-04-16 NOTE — Progress Notes (Signed)
Trinaty Bundrick T. Messiah Rovira, MD, CAQ Sports Medicine [facility] [address] [place], [address]  Phone: [phone]  FAX: [phone]  [name] - 81 y.o. female  MRN [id number]  Date of Birth: October 24, 1941  Date: 04/16/2022  PCP: Joaquim Nam, MD  Referral: Joaquim Nam, MD  Chief Complaint  Patient presents with   Knee Pain    Bilateral Knee Injections    Procedure only.  Aspiration/Injection Procedure Note [name] January 31, 1941 Date of procedure: 04/16/2022  Procedure: Large Joint Joint Aspiration / Injection of the Right Knee Indications: Pain  Procedure Details Patient verbally consented to procedure. Risks, benefits, and alternatives explained. Sterilely prepped with Chloraprep. Ethyl cholride used for anesthesia. 9 cc Lidocaine 1% mixed with 1 mL Kenalog 40 mg injected using the anteromedial approach without difficulty. No complications with procedure and tolerated well. Patient had decreased pain post-injection.  Medication: 1 mL of Kenalog 40 mg  Aspiration/Injection Procedure Note [name] 1941/10/15 Date of procedure: 04/16/2022  Procedure: Large Joint Aspiration / Injection of the Left Knee Indications: Pain  Procedure Details Patient verbally consented to procedure. Risks, benefits, and alternatives explained. Sterilely prepped with Chloraprep. Ethyl cholride used for anesthesia. 9 cc Lidocaine 1% mixed with 1 mL Kenalog 40 mg injected using the anteromedial approach without difficulty. No complications with procedure and tolerated well. Patient had decreased pain post-injection.  Medication: 1 mL of Kenalog 40 mg     ICD-10-CM   1. Primary osteoarthritis of knees, bilateral  M17.0        Signed,  Lezley Bedgood T. Teagen Mcleary, MD

## 2022-05-11 ENCOUNTER — Encounter (INDEPENDENT_AMBULATORY_CARE_PROVIDER_SITE_OTHER): Payer: PPO | Admitting: Vascular Surgery

## 2022-05-17 DIAGNOSIS — Z853 Personal history of malignant neoplasm of breast: Secondary | ICD-10-CM | POA: Diagnosis not present

## 2022-05-17 DIAGNOSIS — R32 Unspecified urinary incontinence: Secondary | ICD-10-CM | POA: Diagnosis not present

## 2022-05-17 DIAGNOSIS — M1990 Unspecified osteoarthritis, unspecified site: Secondary | ICD-10-CM | POA: Diagnosis not present

## 2022-05-17 DIAGNOSIS — G8929 Other chronic pain: Secondary | ICD-10-CM | POA: Diagnosis not present

## 2022-05-17 DIAGNOSIS — E785 Hyperlipidemia, unspecified: Secondary | ICD-10-CM | POA: Diagnosis not present

## 2022-05-17 DIAGNOSIS — E669 Obesity, unspecified: Secondary | ICD-10-CM | POA: Diagnosis not present

## 2022-05-28 ENCOUNTER — Ambulatory Visit (INDEPENDENT_AMBULATORY_CARE_PROVIDER_SITE_OTHER): Payer: PPO

## 2022-05-28 VITALS — Ht 64.0 in | Wt 189.0 lb

## 2022-05-28 DIAGNOSIS — Z0000 Encounter for general adult medical examination without abnormal findings: Secondary | ICD-10-CM | POA: Diagnosis not present

## 2022-05-28 NOTE — Patient Instructions (Addendum)
[name] , Thank you for taking time to come for your Medicare Wellness Visit. I appreciate your ongoing commitment to your health goals. Please review the following plan we discussed and let me know if I can assist you in the future.   These are the goals we discussed:  Goals       Increase water intake      Starting 09/05/2018, I will attempt to drink at least 6-8 glasses daily.       Patient Stated      04/13/2020,  I will maintain and continue medications as prescribed.       Stay Healthy (pt-stated)        This is a list of the screening recommended for you and due dates:  Health Maintenance  Topic Date Due   COVID-19 Vaccine (4 - Pfizer series) 06/13/2022*   Zoster (Shingles) Vaccine (1 of 2) 08/28/2022*   Colon Cancer Screening  04/13/2024*   Flu Shot  06/26/2022   Tetanus Vaccine  07/31/2031   Pneumonia Vaccine  Completed   DEXA scan (bone density measurement)  Completed   HPV Vaccine  Aged Out  *Topic was postponed. The date shown is not the original due date.    Advanced directives: No  Conditions/risks identified: None  Next appointment: Follow up in one year for your annual wellness visit    Preventive Care 65 Years and Older, Female Preventive care refers to lifestyle choices and visits with your health care provider that can promote health and wellness. What does preventive care include? A yearly physical exam. This is also called an annual well check. Dental exams once or twice a year. Routine eye exams. Ask your health care provider how often you should have your eyes checked. Personal lifestyle choices, including: Daily care of your teeth and gums. Regular physical activity. Eating a healthy diet. Avoiding tobacco and drug use. Limiting alcohol use. Practicing safe sex. Taking low-dose aspirin every day. Taking vitamin and mineral supplements as recommended by your health care provider. What happens during an annual well check? The services and  screenings done by your health care provider during your annual well check will depend on your age, overall health, lifestyle risk factors, and family history of disease. Counseling  Your health care provider may ask you questions about your: Alcohol use. Tobacco use. Drug use. Emotional well-being. Home and relationship well-being. Sexual activity. Eating habits. History of falls. Memory and ability to understand (cognition). Work and work Statistician. Reproductive health. Screening  You may have the following tests or measurements: Height, weight, and BMI. Blood pressure. Lipid and cholesterol levels. These may be checked every 5 years, or more frequently if you are over 18 years old. Skin check. Lung cancer screening. You may have this screening every year starting at age 3 if you have a 30-pack-year history of smoking and currently smoke or have quit within the past 15 years. Fecal occult blood test (FOBT) of the stool. You may have this test every year starting at age 42. Flexible sigmoidoscopy or colonoscopy. You may have a sigmoidoscopy every 5 years or a colonoscopy every 10 years starting at age 44. Hepatitis C blood test. Hepatitis B blood test. Sexually transmitted disease (STD) testing. Diabetes screening. This is done by checking your blood sugar (glucose) after you have not eaten for a while (fasting). You may have this done every 1-3 years. Bone density scan. This is done to screen for osteoporosis. You may have this done starting at  age 3. Mammogram. This may be done every 1-2 years. Talk to your health care provider about how often you should have regular mammograms. Talk with your health care provider about your test results, treatment options, and if necessary, the need for more tests. Vaccines  Your health care provider may recommend certain vaccines, such as: Influenza vaccine. This is recommended every year. Tetanus, diphtheria, and acellular pertussis (Tdap,  Td) vaccine. You may need a Td booster every 10 years. Zoster vaccine. You may need this after age 36. Pneumococcal 13-valent conjugate (PCV13) vaccine. One dose is recommended after age 16. Pneumococcal polysaccharide (PPSV23) vaccine. One dose is recommended after age 54. Talk to your health care provider about which screenings and vaccines you need and how often you need them. This information is not intended to replace advice given to you by your health care provider. Make sure you discuss any questions you have with your health care provider. Document Released: 12/09/2015 Document Revised: 08/01/2016 Document Reviewed: 09/13/2015 Elsevier Interactive Patient Education  2017 [place] Prevention in the Home Falls can cause injuries. They can happen to people of all ages. There are many things you can do to make your home safe and to help prevent falls. What can I do on the outside of my home? Regularly fix the edges of walkways and driveways and fix any cracks. Remove anything that might make you trip as you walk through a door, such as a raised step or threshold. Trim any bushes or trees on the path to your home. Use bright outdoor lighting. Clear any walking paths of anything that might make someone trip, such as rocks or tools. Regularly check to see if handrails are loose or broken. Make sure that both sides of any steps have handrails. Any raised decks and porches should have guardrails on the edges. Have any leaves, snow, or ice cleared regularly. Use sand or salt on walking paths during winter. Clean up any spills in your garage right away. This includes oil or grease spills. What can I do in the bathroom? Use night lights. Install grab bars by the toilet and in the tub and shower. Do not use towel bars as grab bars. Use non-skid mats or decals in the tub or shower. If you need to sit down in the shower, use a plastic, non-slip stool. Keep the floor dry. Clean up any  water that spills on the floor as soon as it happens. Remove soap buildup in the tub or shower regularly. Attach bath mats securely with double-sided non-slip rug tape. Do not have throw rugs and other things on the floor that can make you trip. What can I do in the bedroom? Use night lights. Make sure that you have a light by your bed that is easy to reach. Do not use any sheets or blankets that are too big for your bed. They should not hang down onto the floor. Have a firm chair that has side arms. You can use this for support while you get dressed. Do not have throw rugs and other things on the floor that can make you trip. What can I do in the kitchen? Clean up any spills right away. Avoid walking on wet floors. Keep items that you use a lot in easy-to-reach places. If you need to reach something above you, use a strong step stool that has a grab bar. Keep electrical cords out of the way. Do not use floor polish or wax that makes floors  slippery. If you must use wax, use non-skid floor wax. Do not have throw rugs and other things on the floor that can make you trip. What can I do with my stairs? Do not leave any items on the stairs. Make sure that there are handrails on both sides of the stairs and use them. Fix handrails that are broken or loose. Make sure that handrails are as long as the stairways. Check any carpeting to make sure that it is firmly attached to the stairs. Fix any carpet that is loose or worn. Avoid having throw rugs at the top or bottom of the stairs. If you do have throw rugs, attach them to the floor with carpet tape. Make sure that you have a light switch at the top of the stairs and the bottom of the stairs. If you do not have them, ask someone to add them for you. What else can I do to help prevent falls? Wear shoes that: Do not have high heels. Have rubber bottoms. Are comfortable and fit you well. Are closed at the toe. Do not wear sandals. If you use a  stepladder: Make sure that it is fully opened. Do not climb a closed stepladder. Make sure that both sides of the stepladder are locked into place. Ask someone to hold it for you, if possible. Clearly mark and make sure that you can see: Any grab bars or handrails. First and last steps. Where the edge of each step is. Use tools that help you move around (mobility aids) if they are needed. These include: Canes. Walkers. Scooters. Crutches. Turn on the lights when you go into a dark area. Replace any light bulbs as soon as they burn out. Set up your furniture so you have a clear path. Avoid moving your furniture around. If any of your floors are uneven, fix them. If there are any pets around you, be aware of where they are. Review your medicines with your doctor. Some medicines can make you feel dizzy. This can increase your chance of falling. Ask your doctor what other things that you can do to help prevent falls. This information is not intended to replace advice given to you by your health care provider. Make sure you discuss any questions you have with your health care provider. Document Released: 09/08/2009 Document Revised: 04/19/2016 Document Reviewed: 12/17/2014 Elsevier Interactive Patient Education  2017 [employer].

## 2022-05-28 NOTE — Progress Notes (Signed)
Subjective:   [name] is a 81 y.o. female who presents for Medicare Annual (Subsequent) preventive examination.  Review of Systems    Virtual Visit via Telephone Note  I connected with  [name] on 05/28/22 at  2:15 PM EDT by telephone and verified that I am speaking with the correct person using two identifiers.  Location: Patient: Home Provider: Office Persons participating in the virtual visit: patient/Nurse Health Advisor   I discussed the limitations, risks, security and privacy concerns of performing an evaluation and management service by telephone and the availability of in person appointments. The patient expressed understanding and agreed to proceed.  Interactive audio and video telecommunications were attempted between this nurse and patient, however failed, due to patient having technical difficulties OR patient did not have access to video capability.  We continued and completed visit with audio only.  Some vital signs may be absent or patient reported.   Criselda Peaches, LPN  Cardiac Risk Factors include: advanced age (>70mn, >>39women)     Objective:    Today's Vitals   05/28/22 1502  Weight: 189 lb (85.7 kg)  Height: '5\' 4"'$  (1.626 m)   Body mass index is 32.44 kg/m.     05/28/2022    3:13 PM 07/30/2021   11:33 AM 04/13/2020    2:16 PM 09/05/2018    3:19 PM 04/26/2017   11:15 AM  Advanced Directives  Does Patient Have a Medical Advance Directive? No No No No No  Would patient like information on creating a medical advance directive? No - Patient declined No - Patient declined No - Patient declined No - Patient declined     Current Medications (verified) Outpatient Encounter Medications as of 05/28/2022  Medication Sig   acetaminophen (TYLENOL) 325 MG tablet Take 2 tablets (650 mg total) by mouth 3 (three) times daily as needed.   ibuprofen (ADVIL) 200 MG tablet Take 1-2 tablets (200-400 mg total) by mouth every 8 (eight) hours as needed  (with food, for knee pain).   LORazepam (ATIVAN) 1 MG tablet Take 1/4-1/2 tablet by mouth every 8 hours as needed   Multiple Vitamins-Minerals (PRESERVISION AREDS 2 PO) Take by mouth.   No facility-administered encounter medications on file as of 05/28/2022.    Allergies (verified) Lasix [furosemide], Lipitor [atorvastatin], Lisinopril, Metoprolol succinate, Nitrofurantoin, Sulfadiazine, and Yellow jacket venom [bee venom]   History: Past Medical History:  Diagnosis Date   Anxiety    Arthritis    [place]ast cancer ([place]   s/p rady and lumpectomy   Carcinoma in situ of breast 2001   [place]  Esophageal reflux    Fatty liver    Fibromyalgia    Hx of adenomatous colonic polyps    Labyrinthitis, unspecified    Pure hypercholesterolemia    Unspecified essential hypertension    Urinary tract infection, site not specified    Past Surgical History:  Procedure Laterality Date   BREAST BIOPSY  07/2000   positive right side, [facility]    BREAST LUMPECTOMY  08/20/2000   right breast, malignant stage I, [facility]   CATARACT EXTRACTION  1988, 1990   FOOT SURGERY  1980   minor    Family History  Problem Relation Age of Onset   Prostate cancer Brother    Heart disease Brother    Pancreatitis Brother        x 2   Prostate cancer Brother    Alzheimer's disease Father  Dementia Father    Parkinsonism Mother    Heart disease Brother    Heart disease Brother    Breast cancer Paternal Aunt    Colon cancer Neg Hx    Social History   Socioeconomic History   Marital status: Married    Spouse name: Not on file   Number of children: Not on file   Years of education: Not on file   Highest education level: Not on file  Occupational History   Occupation: Housewife  Tobacco Use   Smoking status: Never   Smokeless tobacco: Never  Vaping Use   Vaping Use: Never used  Substance and Sexual Activity   Alcohol use: No    Alcohol/week: 0.0 standard drinks of  alcohol   Drug use: No   Sexual activity: Not Currently  Other Topics Concern   Not on file  Social History Narrative   Patient does not get regular exercise   Daily caffeine-one per day   Married 1958   Retired from [employer] Strain: [place]  (05/28/2022)   Overall Financial Resource Strain ([place])    Difficulty of Paying Living Expenses: Not hard at all  Food Insecurity: No [place] (05/28/2022)   Hunger Vital Sign    Worried About Running Out of Food in the Last Year: Never true    [place] in the Last Year: Never true  Transportation Needs: No Transportation Needs (05/28/2022)   PRAPARE - Hydrologist (Medical): No    Lack of Transportation (Non-Medical): No  Physical Activity: Inactive (05/28/2022)   Exercise Vital Sign    Days of Exercise per Week: 0 days    Minutes of Exercise per Session: 0 min  Stress: No Stress Concern Present (05/28/2022)   [place]    Feeling of Stress : Not at all  Social Connections: [place] (05/28/2022)   Social Connection and Isolation Panel [NHANES]    Frequency of Communication with Friends and Family: More than three times a week    Frequency of Social Gatherings with Friends and Family: More than three times a week    Attends Religious Services: More than 4 times per year    Active Member of [employer] or Organizations: Yes    Attends Music therapist: More than 4 times per year    Marital Status: Married    Clinical Intake:   Diabetic?  No  Interpreter Needed?: NoActivities of Daily Living    05/28/2022    3:08 PM  In your present state of health, do you have any difficulty performing the following activities:  Hearing? 0  Vision? 0  Difficulty concentrating or making decisions? 0  Walking or climbing stairs? 0  Dressing or bathing? 0  Doing errands,  shopping? 0  Preparing Food and eating ? N  Using the Toilet? N  In the past six months, have you accidently leaked urine? Y  Comment Wears pads. Followed by PCP  Do you have problems with loss of bowel control? N  Managing your Medications? N  Managing your Finances? N  Housekeeping or managing your Housekeeping? N    Patient Care Team: Tonia Ghent, MD as PCP - General (Family Medicine)  Indicate any recent Medical Services you may have received from other than [facility] providers in the past year (date may be approximate).     Assessment:  This is a routine wellness examination for [name].  Hearing/Vision screen Hearing Screening - Comments:: No hearing difficulty Vision Screening - Comments:: Wears glasses. Followed by [facility] issues and exercise activities discussed: Exercise limited by: None identified   Goals Addressed               This Visit's Progress     Stay Healthy (pt-stated)         Depression Screen    05/28/2022    3:06 PM 11/01/2020    3:50 PM 04/13/2020    2:17 PM 09/05/2018    3:16 PM 04/26/2017   11:00 AM 02/24/2016   11:09 AM 10/28/2014    8:57 AM  PHQ 2/9 Scores  PHQ - 2 Score 0 0 0 1 0 0 0  PHQ- 9 Score   0 1       Fall Risk    05/28/2022    3:09 PM 11/01/2020    3:50 PM 04/13/2020    2:17 PM 09/05/2018    3:16 PM 04/26/2017   11:00 AM  Fall Risk   Falls in the past year? 1 0 1 Yes Yes  Comment    fell as result of foot getting tangled in a rope; bruising to both knees after falling on rocks pt fell on slippery deck; no injury or medical treatment  Number falls in past yr: 0 0 0 1 1  Injury with Fall? 0 0 0 Yes No  Comment Hematoma to back of head. Followed by medical attention      Risk for fall due to : No Fall Risks  Medication side effect;Impaired balance/gait Impaired balance/gait;Impaired mobility   Follow up  Falls evaluation completed Falls evaluation completed;Falls prevention discussed      FALL RISK  PREVENTION PERTAINING TO THE HOME:  Any stairs in or around the home? Yes  If so, are there any without handrails? No  Home free of loose throw rugs in walkways, pet beds, electrical cords, etc? Yes  Adequate lighting in your home to reduce risk of falls? Yes   ASSISTIVE DEVICES UTILIZED TO PREVENT FALLS:  Life alert? No  Use of a cane, walker or w/c? Yes  Grab bars in the bathroom? No  Shower chair or bench in shower? Yes  Elevated toilet seat or a handicapped toilet? Yes   TIMED UP AND GO:  Was the test performed? No . Audio Visit  Cognitive Function:    04/13/2020    2:19 PM 09/05/2018    3:19 PM 04/26/2017   11:13 AM  MMSE - Mini Mental State Exam  Orientation to time '5 5 5  '$ Orientation to Place '5 5 5  '$ Registration '3 3 3  '$ Attention/ Calculation 5 0 0  Recall '3 3 3  '$ Language- name 2 objects  0 0  Language- repeat '1 1 1  '$ Language- follow 3 step command  3 3  Language- read & follow direction  0 0  Write a sentence  0 0  Copy design  0 0  Total score  20 20        05/28/2022    3:14 PM  6CIT Screen  What Year? 0 points  What month? 0 points  What time? 0 points  Count back from 20 0 points  Months in reverse 0 points  Repeat phrase 0 points  Total Score 0 points    Immunizations Immunization History  Administered Date(s) Administered   Fluad Quad(high Dose 65+) 10/12/2021  Influenza Whole 10/07/2006, 10/22/2007, 09/20/2009, 09/20/2010   Influenza,inj,Quad PF,6+ Mos 08/27/2013, 10/28/2014, 11/07/2016, 12/11/2017, 09/05/2018   PFIZER(Purple Top)SARS-COV-2 Vaccination 12/28/2019, 01/18/2020, 10/26/2020   Pneumococcal Conjugate-13 02/24/2016   Pneumococcal Polysaccharide-23 08/23/1999, 05/26/2012   Td 07/16/2006, 03/15/2011   Tdap 07/30/2021   Zoster, Live 03/10/2009    TDAP status: Up to date  Flu Vaccine status: Up to date  Pneumococcal vaccine status: Up to date  Covid-19 vaccine status: Completed vaccines  Qualifies for Shingles Vaccine? Yes    Zostavax completed No   Shingrix Completed?: No.    Education has been provided regarding the importance of this vaccine. Patient has been advised to call insurance company to determine out of pocket expense if they have not yet received this vaccine. Advised may also receive vaccine at local pharmacy or Health Dept. Verbalized acceptance and understanding.  Screening Tests Health Maintenance  Topic Date Due   COVID-19 Vaccine (4 - Pfizer series) 06/13/2022 (Originally 12/21/2020)   Zoster Vaccines- Shingrix (1 of 2) 08/28/2022 (Originally 09/23/1989)   COLONOSCOPY (Pts 45-28yr Insurance coverage will need to be confirmed)  04/13/2024 (Originally 05/30/2017)   INFLUENZA VACCINE  06/26/2022   TETANUS/TDAP  07/31/2031   Pneumonia Vaccine 81 Years old  Completed   DEXA SCAN  Completed   HPV VACCINES  Aged Out    Health Maintenance  There are no preventive care reminders to display for this patient.   Colorectal cancer screening: No longer required.   Mammogram status: No longer required due to Age.  Bone Density status: Completed 06/10/12. Results reflect: Bone density results: OSTEOPOROSIS. Repeat every   years.  Lung Cancer Screening: (Low Dose CT Chest recommended if Age 83-80years, 30 pack-year currently smoking OR have quit w/in 15years.) does not qualify.     Additional Screening:  Hepatitis C Screening: does not qualify; Completed   Vision Screening: Recommended annual ophthalmology exams for early detection of glaucoma and other disorders of the eye. Is the patient up to date with their annual eye exam?  Yes  Who is the provider or what is the name of the office in which the patient attends annual eye exams? [place] is not established with a provider, would they like to be referred to a provider to establish care? No .   Dental Screening: Recommended annual dental exams for proper oral hygiene  Community Resource Referral / Chronic Care  Management:  CRR required this visit?  No   CCM required this visit?  No      Plan:     I have personally reviewed and noted the following in the patient's chart:   Medical and social history Use of alcohol, tobacco or illicit drugs  Current medications and supplements including opioid prescriptions.  Functional ability and status Nutritional status Physical activity Advanced directives List of other physicians Hospitalizations, surgeries, and ER visits in previous 12 months Vitals Screenings to include cognitive, depression, and falls Referrals and appointments  In addition, I have reviewed and discussed with patient certain preventive protocols, quality metrics, and best practice recommendations. A written personalized care plan for preventive services as well as general preventive health recommendations were provided to patient.     BCriselda Peaches LPN   [id number]  Nurse Notes: None

## 2022-06-25 ENCOUNTER — Encounter: Payer: Self-pay | Admitting: Family

## 2022-06-25 ENCOUNTER — Ambulatory Visit (INDEPENDENT_AMBULATORY_CARE_PROVIDER_SITE_OTHER): Payer: PPO | Admitting: Family

## 2022-06-25 VITALS — BP 140/80 | HR 77 | Temp 98.6°F | Resp 16 | Ht 64.0 in | Wt 196.4 lb

## 2022-06-25 DIAGNOSIS — L03115 Cellulitis of right lower limb: Secondary | ICD-10-CM | POA: Diagnosis not present

## 2022-06-25 DIAGNOSIS — R21 Rash and other nonspecific skin eruption: Secondary | ICD-10-CM | POA: Diagnosis not present

## 2022-06-25 DIAGNOSIS — R600 Localized edema: Secondary | ICD-10-CM | POA: Insufficient documentation

## 2022-06-25 DIAGNOSIS — M79661 Pain in right lower leg: Secondary | ICD-10-CM

## 2022-06-25 DIAGNOSIS — L02415 Cutaneous abscess of right lower limb: Secondary | ICD-10-CM | POA: Insufficient documentation

## 2022-06-25 DIAGNOSIS — M7989 Other specified soft tissue disorders: Secondary | ICD-10-CM

## 2022-06-25 MED ORDER — HYDROCHLOROTHIAZIDE 25 MG PO TABS
25.0000 mg | ORAL_TABLET | Freq: Every day | ORAL | 3 refills | Status: DC
Start: 2022-06-25 — End: 2022-06-25

## 2022-06-25 MED ORDER — TRIAMCINOLONE ACETONIDE 0.1 % EX CREA: 1.0000 | TOPICAL_CREAM | Freq: Two times a day (BID) | CUTANEOUS | 0 refills | Status: DC

## 2022-06-25 MED ORDER — HYDROCHLOROTHIAZIDE 25 MG PO TABS: 25.0000 mg | ORAL_TABLET | Freq: Every day | ORAL | 0 refills | Status: DC

## 2022-06-25 MED ORDER — DOXYCYCLINE HYCLATE 100 MG PO TABS: 100.0000 mg | ORAL_TABLET | Freq: Two times a day (BID) | ORAL | 0 refills | Status: AC

## 2022-06-25 NOTE — Progress Notes (Unsigned)
Established Patient Office Visit  Subjective:  Patient ID: [name], female    DOB: Aug 14, 1941  Age: 81 y.o. MRN: [id number]  CC:  Chief Complaint  Patient presents with   Rash    Wrist and ankles    Edema    X 1 week    HPI [name] is here today with concerns.  Bil le with edema and also with red rash on her right lower ext  She is having some redness and itching bil upper forearms with some bruising from itching  She does report pain in the right lower extremity   This has been going on for one week.    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    Past Surgical History:  Procedure Laterality Date   BREAST BIOPSY  07/2000   positive right side, [facility]    BREAST LUMPECTOMY  08/20/2000   right breast, malignant stage I, [facility]   CATARACT EXTRACTION  1988, 1990   FOOT SURGERY  1980   minor     Family History  Problem Relation Age of Onset   Prostate cancer Brother    Heart disease Brother    Pancreatitis Brother        x 2   Prostate cancer Brother    Alzheimer's disease Father    Dementia Father    Parkinsonism Mother    Heart disease Brother    Heart disease Brother    Breast cancer Paternal Aunt    Colon cancer Neg Hx     Social History   Socioeconomic History   Marital status: Married    Spouse name: Not on file   Number of children: Not on file   Years of education: Not on file   Highest education level: Not on file  Occupational History   Occupation: Housewife  Tobacco Use   Smoking status: Never    Smokeless tobacco: Never  Vaping Use   Vaping Use: Never used  Substance and Sexual Activity   Alcohol use: No    Alcohol/week: 0.0 standard drinks of alcohol   Drug use: No   Sexual activity: Not Currently  Other Topics Concern   Not on file  Social History Narrative   Patient does not get regular exercise   Daily caffeine-one per day   Married 1958   Retired from [employer]   Social Determinants of [employer] Strain: Low Risk  (05/28/2022)   Overall Financial Resource Strain (CARDIA)    Difficulty of Paying Living Expenses: Not hard at all  Food Insecurity: No Food Insecurity (05/28/2022)   Hunger Vital Sign    Worried About Running Out of Food in the Last Year: Never true    Ran Out of Food in the Last Year: Never true  Transportation Needs: No Transportation Needs (05/28/2022)   PRAPARE - Administrator, Civil Service (Medical): No    Lack of Transportation (Non-Medical): No  Physical Activity:  Inactive (05/28/2022)   Exercise Vital Sign    Days of Exercise per Week: 0 days    Minutes of Exercise per Session: 0 min  Stress: No Stress Concern Present (05/28/2022)   [name] [facility] Occupational Health - Occupational Stress Questionnaire    Feeling of Stress : Not at all  Social Connections: Socially Integrated (05/28/2022)   Social Connection and Isolation Panel [NHANES]    Frequency of Communication with Friends and Family: More than three times a week    Frequency of Social Gatherings with Friends and Family: More than three times a week    Attends Religious Services: More than 4 times per year    Active Member of [employer] or Organizations: Yes    Attends Engineer, structural: More than 4 times per year    Marital Status: Married  Catering manager Violence: Not At Risk (05/28/2022)   Humiliation, Afraid, Rape, and Kick questionnaire    Fear of Current or Ex-Partner: No    Emotionally Abused: No    Physically Abused: No    Sexually Abused: No     Outpatient Medications Prior to Visit  Medication Sig Dispense Refill   acetaminophen (TYLENOL) 325 MG tablet Take 2 tablets (650 mg total) by mouth 3 (three) times daily as needed.     ibuprofen (ADVIL) 200 MG tablet Take 1-2 tablets (200-400 mg total) by mouth every 8 (eight) hours as needed (with food, for knee pain).     LORazepam (ATIVAN) 1 MG tablet Take 1/4-1/2 tablet by mouth every 8 hours as needed 30 tablet 0   Multiple Vitamins-Minerals (PRESERVISION AREDS 2 PO) Take by mouth.     No facility-administered medications prior to visit.    Allergies  Allergen Reactions   Lasix [Furosemide]     Rash   Lipitor [Atorvastatin]     intolerant   Lisinopril Other (See Comments)    Intolerant, abd pain   Metoprolol Succinate     REACTION: Exhaustion, increased dizziness   Nitrofurantoin     REACTION: Rash   Sulfadiazine     REACTION: Rash   Yellow Jacket Venom [Bee Venom]     Short of breath        Objective:    Physical Exam Constitutional:      General: She is not in acute distress.    Appearance: Normal appearance. She is obese. She is not ill-appearing, toxic-appearing or diaphoretic.  Cardiovascular:     Rate and Rhythm: Normal rate and regular rhythm.     Pulses:          Dorsalis pedis pulses are 1+ on the right side and 1+ on the left side.       Posterior tibial pulses are 1+ on the right side and 1+ on the left side.  Pulmonary:     Effort: Pulmonary effort is normal.     Breath sounds: Normal breath sounds.  Musculoskeletal:     Right lower leg: 3+ Pitting Edema present.     Left lower leg: 3+ Pitting Edema present.  Skin:    Findings: Rash (redness to right lower extremity from mid shin to lower foot with tenderness) and wound (rle medial wound with yellow discharge and redness) present.     Comments: Slight tenderness with homans sign however not moderate or severe    Neurological:     General: No focal deficit present.     Mental Status: She is  alert and oriented to person, place, and time.  Psychiatric:        Mood and Affect: Mood normal.        Behavior: Behavior normal.        Thought Content: Thought content normal.        Judgment: Judgment normal.        BP 140/80   Pulse 77   Temp 98.6 F (37 C)   Resp 16   Ht 5\' 4"  (1.626 m)   Wt 196 lb 6 oz (89.1 kg)   SpO2 99%   BMI 33.71 kg/m  Wt Readings from Last 3 Encounters:  06/25/22 196 lb 6 oz (89.1 kg)  05/28/22 189 lb (85.7 kg)  04/16/22 197 lb (89.4 kg)     Health Maintenance Due  Topic Date Due   COVID-19 Vaccine (4 - Pfizer series) 12/21/2020    There are no preventive care reminders to display for this patient.  Lab Results  Component Value Date   TSH 3.99 06/09/2018   Lab Results  Component Value Date   WBC 8.9 07/30/2021   HGB 14.1 07/30/2021   HCT 41.8 07/30/2021   MCV 91.5 07/30/2021   PLT 237 07/30/2021   Lab Results  Component Value Date   NA 142 07/30/2021   K 4.1 07/30/2021   CO2 27 07/30/2021   GLUCOSE 117 (H) 07/30/2021   BUN 11 07/30/2021   CREATININE 0.76 07/30/2021   BILITOT 1.5 (H) 07/30/2021   ALKPHOS 103 07/30/2021   AST 15 07/30/2021   ALT 14 07/30/2021   PROT 7.1 07/30/2021   ALBUMIN 4.4 07/30/2021   CALCIUM 9.3 07/30/2021   ANIONGAP 8 07/30/2021   GFR 73.13 04/06/2020   Lab Results  Component Value Date   HGBA1C 5.5 09/05/2018      Assessment & Plan:   Problem List Items Addressed This Visit       Musculoskeletal and Integument   Rash of both hands    rx triamincinolone cream        Relevant Medications   triamcinolone cream (KENALOG) 0.1 %     Other   Pedal edema    rx hctz 25 mg once daily  Check cmp for potassium today as no recent labwork since 9/22  Elevate legs       Relevant Medications   hydrochlorothiazide (HYDRODIURIL) 25 MG tablet   Cellulitis and abscess of right lower extremity    Wound culture today pending results rx RX doxycycline 100 mg po bid x 10 days Monitor for  worsening s/s infection        Relevant Medications   doxycycline (VIBRA-TABS) 100 MG tablet   hydrochlorothiazide (HYDRODIURIL) 25 MG tablet   Other Relevant Orders   US Venous Img Lower Unilateral Right (DVT)   WOUND CULTURE   Comprehensive metabolic panel   Pain and swelling of right lower leg - Primary    Stat US rle however pt states she doesn't want it set up for right now she will call to schedule herself If any sob cp go to er and or call 911      Relevant Medications   hydrochlorothiazide (HYDRODIURIL) 25 MG tablet   Other Relevant Orders   US Venous Img Lower Unilateral Right (DVT)   WOUND CULTURE   Comprehensive metabolic panel   CBC    Meds ordered this encounter  Medications   doxycycline (VIBRA-TABS) 100 MG tablet    Sig: Take 1 tablet (100 mg total) by mouth 2 (two) times daily for 10 days.  Dispense:  20 tablet    Refill:  0    Order Specific Question:   Supervising Provider    Answer:   BEDSOLE, AMY E [[id number]]   triamcinolone cream (KENALOG) 0.1 %    Sig: Apply 1 Application topically 2 (two) times daily.    Dispense:  30 g    Refill:  0    Order Specific Question:   Supervising Provider    Answer:   BEDSOLE, AMY E [[id number]]   DISCONTD: hydrochlorothiazide (HYDRODIURIL) 25 MG tablet    Sig: Take 1 tablet (25 mg total) by mouth daily.    Dispense:  90 tablet    Refill:  3    Order Specific Question:   Supervising Provider    Answer:   BEDSOLE, AMY E [[id number]]   hydrochlorothiazide (HYDRODIURIL) 25 MG tablet    Sig: Take 1 tablet (25 mg total) by mouth daily.    Dispense:  30 tablet    Refill:  0    Order Specific Question:   Supervising Provider    Answer:   Ermalene Searing, AMY E [[id number]]    Follow-up: Return in 4 days (on 06/29/2022) for with me (Tanyah Debruyne) for follow up on wound and swelling.    Mort Sawyers, FNP

## 2022-06-25 NOTE — Assessment & Plan Note (Signed)
Wound culture today pending results rx RX doxycycline 100 mg po bid x 10 days Monitor for worsening s/s infection

## 2022-06-25 NOTE — Patient Instructions (Addendum)
I want you to start taking furosemide 20 mg once daily for your swelling. Come back in five days to repeat potassium as well as monitor swelling and rash.   Monitor for worsening infections such as increasing redness, increasing swelling, and or increased itching.   Stop by the lab prior to leaving today. I will notify you of your results once received.   Keep leg covered.   Elevate legs for swelling.   Start hydrochlorathiazide daily.   I sent in some steroid cream for your arms.   Due to recent changes in healthcare laws, you may see results of your imaging and/or laboratory studies on MyChart before I have had a chance to review them.  I understand that in some cases there may be results that are confusing or concerning to you. Please understand that not all results are received at the same time and often I may need to interpret multiple results in order to provide you with the best plan of care or course of treatment. Therefore, I ask that you please give me 2 business days to thoroughly review all your results before contacting my office for clarification. Should we see a critical lab result, you will be contacted sooner.   It was a pleasure seeing you today! Please do not hesitate to reach out with any questions and or concerns.  Regards,   Eugenia Pancoast FNP-C

## 2022-06-25 NOTE — Assessment & Plan Note (Signed)
rx triamincinolone cream

## 2022-06-25 NOTE — Assessment & Plan Note (Signed)
rx hctz 25 mg once daily  Check cmp for potassium today as no recent labwork since 9/22  Elevate legs

## 2022-06-25 NOTE — Assessment & Plan Note (Signed)
Stat US rle however pt states she doesn't want it set up for right now she will call to schedule herself If any sob cp go to er and or call 911

## 2022-06-26 ENCOUNTER — Telehealth: Payer: Self-pay | Admitting: Family Medicine

## 2022-06-26 LAB — COMPREHENSIVE METABOLIC PANEL
ALT: 10 U/L (ref 0–35)
AST: 15 U/L (ref 0–37)
Albumin: 4.3 g/dL (ref 3.5–5.2)
Alkaline Phosphatase: 98 U/L (ref 39–117)
BUN: 19 mg/dL (ref 6–23)
CO2: 27 mEq/L (ref 19–32)
Calcium: 9.6 mg/dL (ref 8.4–10.5)
Chloride: 104 mEq/L (ref 96–112)
Creatinine, Ser: 1.08 mg/dL (ref 0.40–1.20)
GFR: 47.75 mL/min — ABNORMAL LOW (ref >60.00)
Glucose, Bld: 96 mg/dL (ref 70–99)
Potassium: 4.1 mEq/L (ref 3.5–5.1)
Sodium: 142 mEq/L (ref 135–145)
Total Bilirubin: 0.8 mg/dL (ref 0.2–1.2)
Total Protein: 6.8 g/dL (ref 6.0–8.3)

## 2022-06-26 LAB — CBC
HCT: 40.3 % (ref 36.0–46.0)
Hemoglobin: 13.6 g/dL (ref 12.0–15.0)
MCHC: 33.7 g/dL (ref 30.0–36.0)
MCV: 88.9 fl (ref 78.0–100.0)
Platelets: 247 10*3/uL (ref 150.0–400.0)
RBC: 4.53 Mil/uL (ref 3.87–5.11)
RDW: 13.3 % (ref 11.5–15.5)
WBC: 7.8 10*3/uL (ref 4.0–10.5)

## 2022-06-26 NOTE — Telephone Encounter (Signed)
I called pt and she said that she has been taking his Furosemide 40 Mg and she has not had any problems with it. She also stated the the pharmacist scared her into not wanting to take HCTZ because he was telling her all the side effect.

## 2022-06-26 NOTE — Progress Notes (Signed)
Kidneys have decreased a bit but last time checked was two years ago.  Will continue to monitor.  Potassium ok will repeat at f/u Any urinary symptoms?

## 2022-06-26 NOTE — Telephone Encounter (Signed)
Pt is allergic to furosemide.  She said she already took her husbands HCTZ and tolerated well.

## 2022-06-26 NOTE — Telephone Encounter (Signed)
Patient called and said that [name] sent in prescription for hydrochlorothiazide (HYDRODIURIL) 25 MG tablet and she said after talking to the pharmacisit shes afraid to take it because of the possible side effects and asked if furosemide could be called in instead. Please advise. Call back [phone]

## 2022-06-27 ENCOUNTER — Telehealth: Payer: Self-pay | Admitting: Family Medicine

## 2022-06-27 NOTE — Telephone Encounter (Signed)
Patient returned call about message bellow:  Barkley Bruns, [facility]  06/27/2022 12:15 PM EDT Back to Top    Called pt and a man answered and he said he would have her call us when she got back.   Eugenia Pancoast, FNP  06/26/2022  1:17 PM EDT     Kidneys have decreased a bit but last time checked was two years ago.  Will continue to monitor.  Potassium ok will repeat at f/u Any urinary symptoms?   Patient aware of message and she said she is not currently having urinary symptoms

## 2022-06-27 NOTE — Telephone Encounter (Signed)
Noted  

## 2022-06-28 ENCOUNTER — Ambulatory Visit
Admission: RE | Admit: 2022-06-28 | Discharge: 2022-06-28 | Disposition: A | Discharge status: Home / Self Care | Payer: PPO | Source: Ambulatory Visit | Attending: Family | Admitting: Family

## 2022-06-28 DIAGNOSIS — L02415 Cutaneous abscess of right lower limb: Secondary | ICD-10-CM | POA: Diagnosis not present

## 2022-06-28 DIAGNOSIS — M79661 Pain in right lower leg: Secondary | ICD-10-CM | POA: Insufficient documentation

## 2022-06-28 DIAGNOSIS — L03115 Cellulitis of right lower limb: Secondary | ICD-10-CM | POA: Insufficient documentation

## 2022-06-28 DIAGNOSIS — R600 Localized edema: Secondary | ICD-10-CM | POA: Diagnosis not present

## 2022-06-28 DIAGNOSIS — M7989 Other specified soft tissue disorders: Secondary | ICD-10-CM | POA: Diagnosis not present

## 2022-06-28 LAB — WOUND CULTURE
MICRO NUMBER:: 13714976
SPECIMEN QUALITY:: ADEQUATE

## 2022-06-28 NOTE — Progress Notes (Signed)
The culture came back sensitive to the antbx that pt is currently taking so continue doxycycyline.  Please advise pt I am unsure what to do, furosemide is in her chart as a medication allergy.  HCTZ is what she said she was taking of her husbands in the office hence why I gave her RX for this instead of furosemide.   Tell her I will look at her leg tomorrow for her f/u appt and decide from there if we need fluid pills.

## 2022-06-29 ENCOUNTER — Encounter: Payer: Self-pay | Admitting: Family

## 2022-06-29 ENCOUNTER — Ambulatory Visit (INDEPENDENT_AMBULATORY_CARE_PROVIDER_SITE_OTHER): Payer: PPO | Admitting: Family

## 2022-06-29 VITALS — BP 138/76 | HR 87 | Temp 98.1°F | Resp 16 | Ht 64.0 in | Wt 196.4 lb

## 2022-06-29 DIAGNOSIS — M79661 Pain in right lower leg: Secondary | ICD-10-CM

## 2022-06-29 DIAGNOSIS — L03115 Cellulitis of right lower limb: Secondary | ICD-10-CM

## 2022-06-29 DIAGNOSIS — M7989 Other specified soft tissue disorders: Secondary | ICD-10-CM

## 2022-06-29 DIAGNOSIS — L02415 Cutaneous abscess of right lower limb: Secondary | ICD-10-CM

## 2022-06-29 DIAGNOSIS — R600 Localized edema: Secondary | ICD-10-CM | POA: Diagnosis not present

## 2022-06-29 MED ORDER — DOXYCYCLINE HYCLATE 100 MG PO TABS: 100.0000 mg | ORAL_TABLET | Freq: Two times a day (BID) | ORAL | 0 refills | Status: AC

## 2022-06-29 NOTE — Telephone Encounter (Signed)
Will d/w pt at visit today

## 2022-07-02 NOTE — Assessment & Plan Note (Signed)
Negative u/s lower leg  Referring to vascular for suspected venous insufficiency

## 2022-07-02 NOTE — Assessment & Plan Note (Signed)
Again educated on elevation above heart Watch sodium intake Encouraged hctz 25 mg, low dose Do not take furosemide Weight self daily F/u with pcp in one week

## 2022-07-02 NOTE — Assessment & Plan Note (Signed)
Increase doxycycline 100 mg po bid for total of 14 days rx four more days of doxycycline Monitor daily for worsening s/s infection Also referring to vascular as I feel this may be related to venous insufficiency

## 2022-07-02 NOTE — Progress Notes (Signed)
Established Patient Office Visit  Subjective:  Patient ID: [name], female    DOB: 28-Mar-1941  Age: 81 y.o. MRN: [id number]  CC:  Chief Complaint  Patient presents with   Wound Check   Leg Swelling    HPI [name] is here today for f/u on lower leg wound.  She is currently on day five of ten day bid course of doxycycline 100 mg.  She is having slight improvement in her infection. She reports less drainage, and wound is much dryer. Wound culture shows sensitivity to tetracycline.   She still has lower extremity edema however also reports that she does not use compression stockings and or elevate her feet. She took her husbands furosemide despite me telling her shew as allergic to it and broke out in a rash which is now improving. She is no longer taking furosemide. She did not pick up HCTZ because the pharmacist told her it was 'very dangerous'.   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    Past Surgical History:  Procedure Laterality Date   BREAST BIOPSY  07/2000   positive right side, [facility]    BREAST LUMPECTOMY  08/20/2000   right breast, malignant stage I, [facility]   CATARACT EXTRACTION  1988, [place]   minor     Family History  Problem Relation Age of Onset   Prostate cancer Brother    Heart disease Brother    Pancreatitis Brother        x 2   Prostate cancer Brother    Alzheimer's disease Father    Dementia Father    Parkinsonism Mother    Heart disease Brother    Heart disease Brother    Breast cancer  Paternal Aunt    Colon cancer Neg Hx     Social History   Socioeconomic History   Marital status: Married    Spouse name: Not on file   Number of children: Not on file   Years of education: Not on file   Highest education level: Not on file  Occupational History   Occupation: Housewife  Tobacco Use   Smoking status: Never   Smokeless tobacco: Never  Vaping Use   Vaping Use: Never used  Substance and Sexual Activity   Alcohol use: No    Alcohol/week: 0.0 standard drinks of alcohol   Drug use: No   Sexual activity: Not Currently  Other Topics Concern   Not on file  Social History Narrative   Patient does not get regular exercise   Daily caffeine-one per day   Married 1958   Retired from [employer] Strain: [place]  (05/28/2022)   Overall Financial Resource Strain (CARDIA)    Difficulty of Paying Living Expenses: Not hard at all  Food Insecurity: No Food Insecurity (05/28/2022)   Hunger Vital Sign    Worried About Charity fundraiser in  the Last Year: Never true    [place] in the Last Year: Never true  Transportation Needs: No Transportation Needs (05/28/2022)   PRAPARE - Hydrologist (Medical): No    Lack of Transportation (Non-Medical): No  Physical Activity: Inactive (05/28/2022)   Exercise Vital Sign    Days of Exercise per Week: 0 days    Minutes of Exercise per Session: 0 min  Stress: No Stress Concern Present (05/28/2022)   [place]    Feeling of Stress : Not at all  Social Connections: [place] (05/28/2022)   Social Connection and Isolation Panel [NHANES]    Frequency of Communication with Friends and Family: More than three times a week    Frequency of Social Gatherings with Friends and Family: More than three times a week    Attends Religious Services: More than 4 times per year    Active Member of [employer]  or Organizations: Yes    Attends Music therapist: More than 4 times per year    Marital Status: Married  Human resources officer Violence: Not At Risk (05/28/2022)   Humiliation, Afraid, Rape, and Kick questionnaire    Fear of Current or Ex-Partner: No    Emotionally Abused: No    Physically Abused: No    Sexually Abused: No    Outpatient Medications Prior to Visit  Medication Sig Dispense Refill   acetaminophen (TYLENOL) 325 MG tablet Take 2 tablets (650 mg total) by mouth 3 (three) times daily as needed.     doxycycline (VIBRA-TABS) 100 MG tablet Take 1 tablet (100 mg total) by mouth 2 (two) times daily for 10 days. 20 tablet 0   hydrochlorothiazide (HYDRODIURIL) 25 MG tablet Take 1 tablet (25 mg total) by mouth daily. 30 tablet 0   ibuprofen (ADVIL) 200 MG tablet Take 1-2 tablets (200-400 mg total) by mouth every 8 (eight) hours as needed (with food, for knee pain).     LORazepam (ATIVAN) 1 MG tablet Take 1/4-1/2 tablet by mouth every 8 hours as needed 30 tablet 0   Multiple Vitamins-Minerals (PRESERVISION AREDS 2 PO) Take by mouth.     triamcinolone cream (KENALOG) 0.1 % Apply 1 Application topically 2 (two) times daily. 30 g 0   No facility-administered medications prior to visit.    Allergies  Allergen Reactions   Lasix [Furosemide]     Rash   Lipitor [Atorvastatin]     intolerant   Lisinopril Other (See Comments)    Intolerant, abd pain   Metoprolol Succinate     REACTION: Exhaustion, increased dizziness   Nitrofurantoin     REACTION: Rash   Sulfadiazine     REACTION: Rash   Yellow Jacket Venom [Bee Venom]     Short of breath        Objective:    Physical Exam Constitutional:      Appearance: Normal appearance. She is obese.  Cardiovascular:     Rate and Rhythm: Normal rate and regular rhythm.  Pulmonary:     Effort: Pulmonary effort is normal.     Breath sounds: Normal breath sounds.  Musculoskeletal:     Right lower leg: 3+ Pitting Edema present.      Left lower leg: 3+ Pitting Edema present.  Skin:    Findings: Erythema (red erythematic rash on right lower leg with two areas of dried over wound, slight yellow dried dischage. improving since last visit slightly.) present.  Neurological:     General: No focal deficit present.     Mental Status: She is alert and oriented to person, place, and time. Mental status is at baseline.  Psychiatric:        Mood and Affect: Mood normal.        Behavior: Behavior normal.        Thought Content: Thought content normal.        Judgment: Judgment normal.        BP 138/76   Pulse 87   Temp 98.1 F (36.7 C)   Resp 16   Ht '5\' 4"'$  (1.626 m)   Wt 196 lb 6 oz (89.1 kg)   SpO2 98%   BMI 33.71 kg/m  Wt Readings from Last 3 Encounters:  06/29/22 196 lb 6 oz (89.1 kg)  06/25/22 196 lb 6 oz (89.1 kg)  05/28/22 189 lb (85.7 kg)     Health Maintenance Due  Topic Date Due   COVID-19 Vaccine (4 - Pfizer series) 12/21/2020   INFLUENZA VACCINE  06/26/2022    There are no preventive care reminders to display for this patient.  Lab Results  Component Value Date   TSH 3.99 06/09/2018   Lab Results  Component Value Date   WBC 7.8 06/25/2022   HGB 13.6 06/25/2022   HCT 40.3 06/25/2022   MCV 88.9 06/25/2022   PLT 247.0 06/25/2022   Lab Results  Component Value Date   NA 142 06/25/2022   K 4.1 06/25/2022   CO2 27 06/25/2022   GLUCOSE 96 06/25/2022   BUN 19 06/25/2022   CREATININE 1.08 06/25/2022   BILITOT 0.8 06/25/2022   ALKPHOS 98 06/25/2022   AST 15 06/25/2022   ALT 10 06/25/2022   PROT 6.8 06/25/2022   ALBUMIN 4.3 06/25/2022   CALCIUM 9.6 06/25/2022   ANIONGAP 8 07/30/2021   GFR 47.75 (L) 06/25/2022   Lab Results  Component Value Date   HGBA1C 5.5 09/05/2018      Assessment & Plan:   Problem List Items Addressed This Visit       Other   Pedal edema    Again educated on elevation above heart Watch sodium intake Encouraged hctz 25 mg, low dose Do not take  furosemide Weight self daily F/u with pcp in one week      Relevant Medications   doxycycline (VIBRA-TABS) 100 MG tablet   Other Relevant Orders   Ambulatory referral to Vascular Surgery   Cellulitis and abscess of right lower extremity - Primary    Increase doxycycline 100 mg po bid for total of 14 days rx four more days of doxycycline Monitor daily for worsening s/s infection Also referring to vascular as I feel this may be related to venous insufficiency       Relevant Medications   doxycycline (VIBRA-TABS) 100 MG tablet   Other Relevant Orders   Ambulatory referral to Vascular Surgery   Pain and swelling of right lower leg    Negative u/s lower leg  Referring to vascular for suspected venous insufficiency      Relevant Medications   doxycycline (VIBRA-TABS) 100 MG tablet   Other Relevant Orders   Ambulatory referral to Vascular Surgery    Meds ordered this encounter  Medications   doxycycline (VIBRA-TABS) 100 MG tablet    Sig: Take 1 tablet (100 mg total) by mouth 2 (two) times daily for 4 days. Extend ten day to 14 day total duration for wound    Dispense:  8 tablet    Refill:  0    Order Specific Question:   Supervising Provider    Answer:   BEDSOLE, AMY E [[id number]]    Follow-up: No follow-ups on file.    Eugenia Pancoast, FNP

## 2022-07-03 ENCOUNTER — Encounter (INDEPENDENT_AMBULATORY_CARE_PROVIDER_SITE_OTHER): Payer: PPO | Admitting: Vascular Surgery

## 2022-07-03 ENCOUNTER — Ambulatory Visit (INDEPENDENT_AMBULATORY_CARE_PROVIDER_SITE_OTHER): Payer: PPO | Admitting: Vascular Surgery

## 2022-07-03 ENCOUNTER — Encounter (INDEPENDENT_AMBULATORY_CARE_PROVIDER_SITE_OTHER): Payer: Self-pay | Admitting: Vascular Surgery

## 2022-07-03 VITALS — BP 162/100 | HR 85 | Resp 18

## 2022-07-03 DIAGNOSIS — M7989 Other specified soft tissue disorders: Secondary | ICD-10-CM | POA: Diagnosis not present

## 2022-07-03 DIAGNOSIS — L97211 Non-pressure chronic ulcer of right calf limited to breakdown of skin: Secondary | ICD-10-CM | POA: Insufficient documentation

## 2022-07-03 DIAGNOSIS — M1710 Unilateral primary osteoarthritis, unspecified knee: Secondary | ICD-10-CM | POA: Insufficient documentation

## 2022-07-03 NOTE — Progress Notes (Signed)
Patient ID: TIA HIERONYMUS, female   DOB: [birth year], 81 y.o.   MRN: [id number]  Chief Complaint  Patient presents with   Establish Care    Referred by Dr Phebe Colla    HPI [name] is a 81 y.o. female.  I am asked to see the patient by Eugenia Pancoast for evaluation of swelling and stasis dermatitis changes with ulceration of the right lower extremity.  Within the past couple of months, the right lower leg has developed marked skin changes with weeping and skin breakdown.  She has had chronic swelling on and off for a long time.  This has worsened over the past few years with worsening mobility.  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  She says every time that she gets her shots in her knees that reduces the inflammation her leg swelling goes down noticeably.  It always swell some but it gets much worse between shots.  She is due to have that shot in about 2 to 3 weeks.  This time, she has been started on a cream for her legs which has not really helped.  She cannot really get compression socks on and off and so she does not wear them regularly.  She does try to elevate her legs is much as possible.  She likes to be active and ambulatory, but her knees have limited this recently.  She had a negative DVT study recently and has no previous history of DVT or superficial thrombophlebitis to her knowledge.     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    Past Surgical History:  Procedure Laterality  Date   BREAST BIOPSY  07/2000   positive right side, [facility]    BREAST LUMPECTOMY  08/20/2000   right breast, malignant stage I, [facility]   CATARACT EXTRACTION  1988, [place]   minor      Family History  Problem Relation Age of Onset   Prostate cancer Brother    Heart disease Brother    Pancreatitis Brother        x 2   Prostate cancer Brother    Alzheimer's disease Father    Dementia Father    Parkinsonism Mother    Heart disease Brother    Heart disease Brother    Breast cancer Paternal Aunt    Colon cancer Neg Hx       Social History   Tobacco Use   Smoking status: Never   Smokeless tobacco: Never  Vaping Use   Vaping Use: Never used  Substance Use Topics   Alcohol use: No    Alcohol/week: 0.0 standard drinks of alcohol   Drug use: No     Allergies  Allergen Reactions   Lasix [Furosemide]     Rash   Lipitor [Atorvastatin]     intolerant   Lisinopril Other (See Comments)  Intolerant, abd pain   Metoprolol Succinate     REACTION: Exhaustion, increased dizziness   Nitrofurantoin     REACTION: Rash   Sulfadiazine     REACTION: Rash   Yellow Jacket Venom [Bee Venom]     Short of breath    Current Outpatient Medications  Medication Sig Dispense Refill   acetaminophen (TYLENOL) 325 MG tablet Take 2 tablets (650 mg total) by mouth 3 (three) times daily as needed.     doxycycline (VIBRA-TABS) 100 MG tablet Take 1 tablet (100 mg total) by mouth 2 (two) times daily for 10 days. 20 tablet 0   doxycycline (VIBRA-TABS) 100 MG tablet Take 1 tablet (100 mg total) by mouth 2 (two) times daily for 4 days. Extend ten day to 14 day total duration for wound 8 tablet 0   hydrochlorothiazide (HYDRODIURIL) 25 MG tablet Take 1 tablet (25 mg total) by mouth daily. 30 tablet 0   ibuprofen (ADVIL) 200 MG tablet Take 1-2 tablets (200-400 mg total) by mouth every 8 (eight) hours as needed (with food, for knee pain).     LORazepam (ATIVAN) 1 MG tablet Take 1/4-1/2  tablet by mouth every 8 hours as needed 30 tablet 0   Multiple Vitamins-Minerals (PRESERVISION AREDS 2 PO) Take by mouth.     triamcinolone cream (KENALOG) 0.1 % Apply 1 Application topically 2 (two) times daily. (Patient not taking: Reported on 07/03/2022) 30 g 0   No current facility-administered medications for this visit.      REVIEW OF SYSTEMS (Negative unless checked)  Constitutional: '[]'$ Weight loss  '[]'$ Fever  '[]'$ Chills Cardiac: '[]'$ Chest pain   '[]'$ Chest pressure   '[]'$ Palpitations   '[]'$ Shortness of breath when laying flat   '[]'$ Shortness of breath at rest   '[]'$ Shortness of breath with exertion. Vascular:  '[]'$ Pain in legs with walking   '[]'$ Pain in legs at rest   '[]'$ Pain in legs when laying flat   '[]'$ Claudication   '[]'$ Pain in feet when walking  '[]'$ Pain in feet at rest  '[]'$ Pain in feet when laying flat   '[]'$ History of DVT   '[]'$ Phlebitis   '[]'$ Swelling in legs   '[]'$ Varicose veins   '[x]'$ Non-healing ulcers Pulmonary:   '[]'$ Uses home oxygen   '[]'$ Productive cough   '[]'$ Hemoptysis   '[]'$ Wheeze  '[]'$ COPD   '[]'$ Asthma Neurologic:  '[]'$ Dizziness  '[]'$ Blackouts   '[]'$ Seizures   '[]'$ History of stroke   '[]'$ History of TIA  '[]'$ Aphasia   '[]'$ Temporary blindness   '[]'$ Dysphagia   '[]'$ Weakness or numbness in arms   '[]'$ Weakness or numbness in legs Musculoskeletal:  '[x]'$ Arthritis   '[x]'$ Joint swelling   '[x]'$ Joint pain   '[]'$ Low back pain Hematologic:  '[]'$ Easy bruising  '[]'$ Easy bleeding   '[]'$ Hypercoagulable state   '[]'$ Anemic  '[]'$ Hepatitis Gastrointestinal:  '[]'$ Blood in stool   '[]'$ Vomiting blood  '[x]'$ Gastroesophageal reflux/heartburn   '[]'$ Abdominal pain Genitourinary:  '[]'$ Chronic kidney disease   '[]'$ Difficult urination  '[]'$ Frequent urination  '[]'$ Burning with urination   '[]'$ Hematuria Skin:  '[]'$ Rashes   '[x]'$ Ulcers   '[x]'$ Wounds Psychological:  '[x]'$ History of anxiety   '[]'$  History of major depression.    Physical Exam BP (!) 162/100 (BP Location: Left Arm)   Pulse 85   Resp 18  Gen:  WD/WN, NAD.  Appears younger than stated age Head: /AT, No temporalis wasting.  Ear/Nose/Throat:  Hearing grossly intact, nares w/o erythema or drainage, oropharynx w/o Erythema/Exudate Eyes: Conjunctiva clear, sclera non-icteric  Neck: trachea midline.  No JVD.  Pulmonary:  Good air movement, respirations not labored, no use of accessory muscles  Cardiac:  RRR, no JVD Vascular:  Vessel Right Left  Radial Palpable Palpable                                   Gastrointestinal:. No masses, surgical incisions, or scars. Musculoskeletal: M/S 5/5 throughout.  Extremities without ischemic changes.  No deformity or atrophy.  Significant stasis dermatitis changes present in the right lower leg with dry scaling skin with openings and weeping.  The left leg has no skin breakdown and only very mild stasis changes.  3+ right lower extremity edema and 2+ left lower extremity edema. Neurologic: Sensation grossly intact in extremities.  Symmetrical.  Speech is fluent. Motor exam as listed above. Psychiatric: Judgment intact, Mood & affect appropriate for pt's clinical situation. Dermatologic: Right lower leg wound as above.    Radiology US Venous Img Lower Unilateral Right (DVT)  Result Date: 06/28/2022 CLINICAL DATA:  right lower extremity pain with edema EXAM: RIGHT LOWER EXTREMITY VENOUS DOPPLER ULTRASOUND TECHNIQUE: Gray-scale sonography with compression, as well as color and duplex ultrasound, were performed to evaluate the deep venous system(s) from the level of the common femoral vein through the popliteal and proximal calf veins. COMPARISON:  None Available. FINDINGS: VENOUS Normal compressibility of the common femoral, superficial femoral, and popliteal veins, as well as the visualized calf veins. Visualized portions of profunda femoral vein and great saphenous vein unremarkable. No filling defects to suggest DVT on grayscale or color Doppler imaging. Doppler waveforms show normal direction of venous flow, normal respiratory plasticity and response to augmentation. Limited views of the  contralateral common femoral vein are unremarkable. Other: Right calf edema. IMPRESSION: No evidence of DVT in the right lower extremity. Electronically Signed   By: Margaretha Sheffield M.D.   On: 06/28/2022 14:08    Labs Recent Results (from the past 2160 hour(s))  WOUND CULTURE     Status: Abnormal   Collection Time: 06/25/22  4:28 PM   Specimen: Blood  Result Value Ref Range   MICRO NUMBER: [id number]    SPECIMEN QUALITY: Adequate    SOURCE: R LOWER LEG WOUND    STATUS: FINAL    GRAM STAIN:      No organisms or white blood cells seen No epithelial cells seen   ISOLATE 1: Staphylococcus aureus (A)     Comment: Heavy growth of Staphylococcus aureus      Susceptibility   Staphylococcus aureus - AEROBIC CULT, GRAM STAIN POSITIVE 1    VANCOMYCIN <=0.5 Sensitive     CIPROFLOXACIN <=0.5 Sensitive     CLINDAMYCIN <=0.25 Sensitive     LEVOFLOXACIN <=0.12 Sensitive     ERYTHROMYCIN <=0.25 Sensitive     GENTAMICIN <=0.5 Sensitive     OXACILLIN* <=0.25 Sensitive      * Oxacillin susceptible staphylococci are susceptible to other penicillinase-stable penicillins (e.g., methicillin, nafcillin), beta- lactam/beta-lactamase inhibitor combinations, and cephems with staphylococcal indications, including cefazolin.     TETRACYCLINE <=1 Sensitive     TRIMETH/SULFA* <=10 Sensitive      * Oxacillin susceptible staphylococci are susceptible to other penicillinase-stable penicillins (e.g., methicillin, nafcillin), beta- lactam/beta-lactamase inhibitor combinations, and cephems with staphylococcal indications, including cefazolin. Legend: S = Susceptible  I = Intermediate R = Resistant  NS = Not susceptible * = Not tested  NR = Not reported **NN = See antimicrobic comments   Comprehensive metabolic panel     Status: Abnormal   Collection Time: 06/25/22  4:28 PM  Result Value Ref Range   Sodium 142 135 - 145 mEq/L   Potassium 4.1 3.5 - 5.1 mEq/L   Chloride 104 96 - 112 mEq/L   CO2 27 19 - 32  mEq/L   Glucose, Bld 96 70 - 99 mg/dL   BUN 19 6 - 23 mg/dL   Creatinine, Ser 1.08 0.40 - 1.20 mg/dL   Total Bilirubin 0.8 0.2 - 1.2 mg/dL   Alkaline Phosphatase 98 39 - 117 U/L   AST 15 0 - 37 U/L   ALT 10 0 - 35 U/L   Total Protein 6.8 6.0 - 8.3 g/dL   Albumin 4.3 3.5 - 5.2 g/dL   GFR 47.75 (L) >60.00 mL/min    Comment: Calculated using the CKD-EPI Creatinine Equation (2021)   Calcium 9.6 8.4 - 10.5 mg/dL  CBC     Status: None   Collection Time: 06/25/22  4:28 PM  Result Value Ref Range   WBC 7.8 4.0 - 10.5 K/uL   RBC 4.53 3.87 - 5.11 Mil/uL   Platelets 247.0 150.0 - 400.0 K/uL   Hemoglobin 13.6 12.0 - 15.0 g/dL   HCT 40.3 36.0 - 46.0 %   MCV 88.9 78.0 - 100.0 fl   MCHC 33.7 30.0 - 36.0 g/dL   RDW 13.3 11.5 - 15.5 %    Assessment/Plan:  Lower limb ulcer, calf, right, limited to breakdown of skin (HCC) The patient has developed skin breakdown with weeping and ulceration on the right lower leg.  This is from severe swelling and skin stasis changes.  Were going to wrap her in a 3 layer Unna boot today and change this weekly.  Once she is out of the [employer], were going to get her into a compression system that she can use regularly.  Venous reflux study will also be performed in the near future at her convenience and she will ultimately likely benefit from a lymphedema pump.  Swelling of limb This has not resulted in skin breakdown as above.  The Unna boot will help take care of the compression garment on the right leg.  I written a prescription for the juxta lite compression system to be used bilaterally once her skin heals and she could go ahead and start using it on the left leg at that point.  Continued elevation and as much activity as tolerated will be helpful.  Hopefully the injections for her knee arthritis in the near future will help as well.  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  We will be seeing the patient back in a few weeks after weekly Unna boots and we will plan a reflux study at that time.  Arthritis of knee The patient's significant knee arthritis is certainly limiting her mobility and worsening her lower extremity swelling.  She is due for an injection on her knees in the next few weeks.      Leotis Pain 07/03/2022, 3:00 PM   This note was created with Dragon medical transcription system.  Any errors from dictation are unintentional.

## 2022-07-03 NOTE — Assessment & Plan Note (Signed)
This has not resulted in skin breakdown as above.  The Unna boot will help take care of the compression garment on the right leg.  I written a prescription for the juxta lite compression system to be used bilaterally once her skin heals and she could go ahead and start using it on the left leg at that point.  Continued elevation and as much activity as tolerated will be helpful.  Hopefully the injections for her knee arthritis in the near future will help as well.  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  We will be seeing the patient back in a few weeks after weekly Unna boots and we will plan a reflux study at that time.

## 2022-07-03 NOTE — Assessment & Plan Note (Signed)
The patient's significant knee arthritis is certainly limiting her mobility and worsening her lower extremity swelling.  She is due for an injection on her knees in the next few weeks.

## 2022-07-03 NOTE — Assessment & Plan Note (Signed)
The patient has developed skin breakdown with weeping and ulceration on the right lower leg.  This is from severe swelling and skin stasis changes.  Were going to wrap her in a 3 layer Unna boot today and change this weekly.  Once she is out of the [employer], were going to get her into a compression system that she can use regularly.  Venous reflux study will also be performed in the near future at her convenience and she will ultimately likely benefit from a lymphedema pump.

## 2022-07-10 ENCOUNTER — Ambulatory Visit (INDEPENDENT_AMBULATORY_CARE_PROVIDER_SITE_OTHER): Payer: PPO | Admitting: Nurse Practitioner

## 2022-07-10 ENCOUNTER — Encounter (INDEPENDENT_AMBULATORY_CARE_PROVIDER_SITE_OTHER): Payer: Self-pay | Admitting: Nurse Practitioner

## 2022-07-10 VITALS — BP 178/112 | HR 79 | Resp 16

## 2022-07-10 DIAGNOSIS — L97211 Non-pressure chronic ulcer of right calf limited to breakdown of skin: Secondary | ICD-10-CM

## 2022-07-10 NOTE — Progress Notes (Unsigned)
History of Present Illness  There is no documented history at this time  Assessments & Plan   There are no diagnoses linked to this encounter.    Additional instructions  Subjective:  Patient presents with venous ulcer of the Right lower extremity.    Procedure:  3 layer unna wrap was placed Right lower extremity.   Plan:   Follow up in one week.   

## 2022-07-11 ENCOUNTER — Encounter (INDEPENDENT_AMBULATORY_CARE_PROVIDER_SITE_OTHER): Payer: Self-pay | Admitting: Nurse Practitioner

## 2022-07-12 DIAGNOSIS — L309 Dermatitis, unspecified: Secondary | ICD-10-CM | POA: Diagnosis not present

## 2022-07-12 DIAGNOSIS — I872 Venous insufficiency (chronic) (peripheral): Secondary | ICD-10-CM | POA: Diagnosis not present

## 2022-07-13 ENCOUNTER — Ambulatory Visit: Payer: PPO | Admitting: Family Medicine

## 2022-07-16 ENCOUNTER — Ambulatory Visit: Payer: PPO | Admitting: Family Medicine

## 2022-07-17 ENCOUNTER — Encounter (INDEPENDENT_AMBULATORY_CARE_PROVIDER_SITE_OTHER): Payer: PPO

## 2022-07-17 NOTE — Progress Notes (Unsigned)
[name] Musson T. [name], MD, CAQ Sports Medicine [facility] at [facility] [address] [place], [address]  Phone: [phone]  FAX: [phone]  [name] - 81 y.o. female  MRN [id number]  Date of Birth: 1940-12-03  Date: 07/18/2022  PCP: Joaquim Nam, MD  Referral: Joaquim Nam, MD  No chief complaint on file.  Subjective:   [name] is a 81 y.o. very pleasant female patient with There is no height or weight on file to calculate BMI. who presents with the following:  Very pleasant lady who I have known for many years who presents in follow-up for bilateral severe knee osteoarthritis.  She has been plagued with some severe knee pain off and on for years.  She is not in a place personally where she can take time away from her husband's health to have knee arthroplasty.  Review of Systems is noted in the HPI, as appropriate  Objective:   There were no vitals taken for this visit.  GEN: No acute distress; alert,appropriate. PULM: Breathing comfortably in no respiratory distress PSYCH: Normally interactive.   Laboratory and Imaging Data:  Assessment and Plan:   ***

## 2022-07-18 ENCOUNTER — Ambulatory Visit (INDEPENDENT_AMBULATORY_CARE_PROVIDER_SITE_OTHER): Payer: PPO | Admitting: Family Medicine

## 2022-07-18 ENCOUNTER — Encounter: Payer: Self-pay | Admitting: Family Medicine

## 2022-07-18 VITALS — BP 130/80 | HR 78 | Temp 97.9°F | Ht 64.0 in | Wt 196.4 lb

## 2022-07-18 DIAGNOSIS — R21 Rash and other nonspecific skin eruption: Secondary | ICD-10-CM

## 2022-07-18 DIAGNOSIS — M170 Bilateral primary osteoarthritis of knee: Secondary | ICD-10-CM

## 2022-07-18 MED ORDER — TRIAMCINOLONE ACETONIDE 0.1 % EX CREA: 1.0000 | TOPICAL_CREAM | Freq: Two times a day (BID) | CUTANEOUS | 0 refills | Status: DC

## 2022-07-24 ENCOUNTER — Encounter (INDEPENDENT_AMBULATORY_CARE_PROVIDER_SITE_OTHER): Payer: PPO

## 2022-07-31 ENCOUNTER — Encounter (INDEPENDENT_AMBULATORY_CARE_PROVIDER_SITE_OTHER): Payer: PPO

## 2022-08-07 ENCOUNTER — Ambulatory Visit (INDEPENDENT_AMBULATORY_CARE_PROVIDER_SITE_OTHER): Payer: PPO | Admitting: Nurse Practitioner

## 2022-08-28 NOTE — Progress Notes (Unsigned)
[name] Fait T. Raylan Hanton, MD, CAQ Sports Medicine [facility] at [facility] [address] [place], [address]  Phone: [phone]  FAX: [phone]  IOLANI SOLAND - 81 y.o. female  MRN [id number]  Date of Birth: [birth year]  Date: 08/29/2022  PCP: Joaquim Nam, MD  Referral: Joaquim Nam, MD  No chief complaint on file.  Subjective:   DUSTY LEE is a 81 y.o. very pleasant female patient with There is no height or weight on file to calculate BMI. who presents with the following:  Severe B knee osteoarthritis.   Aspiration/Injection Procedure Note KAMPBELL DIFELICE 05-Jun-1941 Date of procedure: 08/29/2022  Procedure: Large Joint Joint Aspiration / Injection of the Right Knee Indications: Pain  Procedure Details Patient verbally consented to procedure. Risks, benefits, and alternatives explained. Sterilely prepped with Chloraprep. Ethyl cholride used for anesthesia. 9 cc Lidocaine 1% mixed with 1 mL Kenalog 40 mg injected using the anteromedial approach without difficulty. No complications with procedure and tolerated well. Patient had decreased pain post-injection.  Medication: 1 mL of Kenalog 40 mg  Aspiration/Injection Procedure Note JARETTA BOL 1941-08-27 Date of procedure: 08/29/2022  Procedure: Large Joint Aspiration / Injection of the Left Knee Indications: Pain  Procedure Details Patient verbally consented to procedure. Risks, benefits, and alternatives explained. Sterilely prepped with Chloraprep. Ethyl cholride used for anesthesia. 9 cc Lidocaine 1% mixed with 1 mL Kenalog 40 mg injected using the anteromedial approach without difficulty. No complications with procedure and tolerated well. Patient had decreased pain post-injection.  Medication: 1 mL of Kenalog 40 mg

## 2022-08-29 ENCOUNTER — Encounter: Payer: Self-pay | Admitting: Family Medicine

## 2022-08-29 ENCOUNTER — Ambulatory Visit (INDEPENDENT_AMBULATORY_CARE_PROVIDER_SITE_OTHER): Payer: PPO | Admitting: Family Medicine

## 2022-08-29 VITALS — BP 150/86 | HR 72 | Temp 97.5°F | Ht 64.0 in | Wt 199.0 lb

## 2022-08-29 DIAGNOSIS — M170 Bilateral primary osteoarthritis of knee: Secondary | ICD-10-CM

## 2022-08-29 MED ORDER — TRIAMCINOLONE ACETONIDE 40 MG/ML IJ SUSP
40.0000 mg | Freq: Once | INTRAMUSCULAR | Status: AC
  Administered 2022-08-29: 40 mg via INTRA_ARTICULAR

## 2022-09-24 ENCOUNTER — Encounter (INDEPENDENT_AMBULATORY_CARE_PROVIDER_SITE_OTHER): Payer: Self-pay

## 2022-10-17 DIAGNOSIS — H353131 Nonexudative age-related macular degeneration, bilateral, early dry stage: Secondary | ICD-10-CM | POA: Diagnosis not present

## 2022-12-04 ENCOUNTER — Encounter: Payer: Self-pay | Admitting: Internal Medicine

## 2022-12-04 ENCOUNTER — Ambulatory Visit (INDEPENDENT_AMBULATORY_CARE_PROVIDER_SITE_OTHER): Payer: PPO | Admitting: Internal Medicine

## 2022-12-04 VITALS — BP 138/82 | HR 78 | Temp 97.2°F | Ht 64.0 in | Wt 202.0 lb

## 2022-12-04 DIAGNOSIS — L03115 Cellulitis of right lower limb: Secondary | ICD-10-CM | POA: Insufficient documentation

## 2022-12-04 DIAGNOSIS — I83002 Varicose veins of unspecified lower extremity with ulcer of calf: Secondary | ICD-10-CM

## 2022-12-04 DIAGNOSIS — L97201 Non-pressure chronic ulcer of unspecified calf limited to breakdown of skin: Secondary | ICD-10-CM | POA: Diagnosis not present

## 2022-12-04 DIAGNOSIS — I83009 Varicose veins of unspecified lower extremity with ulcer of unspecified site: Secondary | ICD-10-CM | POA: Insufficient documentation

## 2022-12-04 DIAGNOSIS — I872 Venous insufficiency (chronic) (peripheral): Secondary | ICD-10-CM | POA: Insufficient documentation

## 2022-12-04 MED ORDER — TORSEMIDE 20 MG PO TABS: 20.0000 mg | ORAL_TABLET | Freq: Every day | ORAL | 0 refills | Status: DC | PRN

## 2022-12-04 MED ORDER — CEPHALEXIN 500 MG PO CAPS: 500.0000 mg | ORAL_CAPSULE | Freq: Three times a day (TID) | ORAL | 1 refills | Status: DC

## 2022-12-04 NOTE — Assessment & Plan Note (Signed)
Will restart cephalexin 500 tid and have her see Dr Damita Dunnings next week for follow up

## 2022-12-04 NOTE — Patient Instructions (Signed)
Please keep your legs elevated as much as possible and use the support hose/socks Try the fluid pill torsemide every day for now---and check your weight every day Set up to see Dr Damita Dunnings next week. Use the triamcinolone cream for the itching on your arms

## 2022-12-04 NOTE — Progress Notes (Signed)
Subjective:    Patient ID: [name], female    DOB: January 03, 1941, 82 y.o.   MRN: [id number]  HPI Here due to worsened swelling in her right leg  Both legs swell--but her right leg is worse Had been treated at the wound center--but had gotten better On her feet a lot---scratched the back of her heel and it has gotten worse Increased redness and pain Ulcers on right have been back for some months Some itching---was wearing support stockings (intermittent)  Has tried the triamcinolone cream  Current Outpatient Medications on File Prior to Visit  Medication Sig Dispense Refill   acetaminophen (TYLENOL) 325 MG tablet Take 2 tablets (650 mg total) by mouth 3 (three) times daily as needed.     fluocinonide ointment (LIDEX) 0.05 % SMARTSIG:sparingly Topical Twice Daily PRN     hydrochlorothiazide (HYDRODIURIL) 25 MG tablet Take 1 tablet (25 mg total) by mouth daily. 30 tablet 0   ibuprofen (ADVIL) 200 MG tablet Take 1-2 tablets (200-400 mg total) by mouth every 8 (eight) hours as needed (with food, for knee pain).     LORazepam (ATIVAN) 1 MG tablet Take 1/4-1/2 tablet by mouth every 8 hours as needed 30 tablet 0   Multiple Vitamins-Minerals (PRESERVISION AREDS 2 PO) Take by mouth.     triamcinolone cream (KENALOG) 0.1 % Apply 1 Application topically 2 (two) times daily. 454 g 0   No current facility-administered medications on file prior to visit.    Allergies  Allergen Reactions   Lasix [Furosemide]     Rash   Lipitor [Atorvastatin]     intolerant   Lisinopril Other (See Comments)    Intolerant, abd pain   Metoprolol Succinate     REACTION: Exhaustion, increased dizziness   Nitrofurantoin     REACTION: Rash   Sulfadiazine     REACTION: Rash   Yellow Jacket Venom [Bee Venom]     Short of breath    Past Medical History:  Diagnosis Date   Anxiety    Arthritis    Breast cancer ([place]) 2001   s/p rady and lumpectomy   Carcinoma in situ of breast 2001   [place]   Esophageal reflux    Fatty liver    Fibromyalgia    Hx of adenomatous colonic polyps    Labyrinthitis, unspecified    Pure hypercholesterolemia    Unspecified essential hypertension    Urinary tract infection, site not specified    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    Past Surgical History:  Procedure Laterality Date   BREAST BIOPSY  07/2000   positive right side, [facility]    BREAST LUMPECTOMY  08/20/2000   right breast, malignant stage I, [facility]   CATARACT EXTRACTION  1988, 1990   FOOT SURGERY  1980   minor     Family History  Problem Relation Age of Onset   Prostate cancer Brother    Heart disease Brother    Pancreatitis Brother        x 2   Prostate cancer Brother    Alzheimer's disease Father    Dementia Father    Parkinsonism Mother    Heart disease Brother    Heart disease Brother    Breast cancer Paternal Aunt    Colon cancer Neg Hx     Social History   Socioeconomic History   Marital status: Married    Spouse name: Not on file   Number of children: Not on  file   Years of education: Not on file   Highest education level: Not on file  Occupational History   Occupation: Housewife  Tobacco Use   Smoking status: Never   Smokeless tobacco: Never  Vaping Use   Vaping Use: Never used  Substance and Sexual Activity   Alcohol use: No    Alcohol/week: 0.0 standard drinks of alcohol   Drug use: No   Sexual activity: Not Currently  Other Topics Concern   Not on file  Social History Narrative   Patient does not get regular exercise   Daily caffeine-one per day   Married 1958   Retired from [employer] Strain: [place]  (05/28/2022)   Overall Financial Resource Strain (CARDIA)    Difficulty of Paying Living Expenses: Not hard at all  Food Insecurity: No Food Insecurity (05/28/2022)   Hunger Vital Sign    Worried About Running Out of Food in the Last Year: Never true    [place]  in the Last Year: Never true  Transportation Needs: No Transportation Needs (05/28/2022)   PRAPARE - Hydrologist (Medical): No    Lack of Transportation (Non-Medical): No  Physical Activity: Inactive (05/28/2022)   Exercise Vital Sign    Days of Exercise per Week: 0 days    Minutes of Exercise per Session: 0 min  Stress: No Stress Concern Present (05/28/2022)   [place]    Feeling of Stress : Not at all  Social Connections: [place] (05/28/2022)   Social Connection and Isolation Panel [NHANES]    Frequency of Communication with Friends and Family: More than three times a week    Frequency of Social Gatherings with Friends and Family: More than three times a week    Attends Religious Services: More than 4 times per year    Active Member of [employer] or Organizations: Yes    Attends Music therapist: More than 4 times per year    Marital Status: Married  Human resources officer Violence: Not At Risk (05/28/2022)   Humiliation, Afraid, Rape, and Kick questionnaire    Fear of Current or Ex-Partner: No    Emotionally Abused: No    Physically Abused: No    Sexually Abused: No   Review of Systems Does add salt Weight is the same No chest pain or SOB    Objective:   Physical Exam Constitutional:      Appearance: Normal appearance.  Musculoskeletal:     Comments: 2+ edema slightly worse on right Multiple superficial ulcers on lower right calf---with redness/warmth and mild tenderness. Slight discharge from several  Neurological:     Mental Status: She is alert.            Assessment & Plan:

## 2022-12-04 NOTE — Assessment & Plan Note (Addendum)
Will give her torsemide 20 for prn use (use daily for a few days) Has the compression socks/hose Follow up next week===may need to go back to vascular for UNNA boots again

## 2022-12-11 ENCOUNTER — Ambulatory Visit (INDEPENDENT_AMBULATORY_CARE_PROVIDER_SITE_OTHER): Payer: PPO | Admitting: Family Medicine

## 2022-12-11 ENCOUNTER — Encounter: Payer: Self-pay | Admitting: Family Medicine

## 2022-12-11 VITALS — BP 124/70 | HR 82 | Temp 97.9°F | Ht 64.0 in | Wt 202.0 lb

## 2022-12-11 DIAGNOSIS — R609 Edema, unspecified: Secondary | ICD-10-CM | POA: Diagnosis not present

## 2022-12-11 NOTE — Progress Notes (Signed)
Tearful discussing her husband.   He is chronically ill.    Discussed recent events.  Had trouble elevating her legs while caring for her husband.   Still on keflex.  No change in the meantime.  Has worn compression stockings.  Hasn't tried torsemide yet.  No fevers.  Legs prev weeping but not now.    Per patient, she could take furosemide w/o ADE or rash.    Meds, vitals, and allergies reviewed.   ROS: Per HPI unless specifically indicated in ROS section   Nad Ncat Tearful but regains composure. Rrr Ctab Abdomen soft.  Nontender. 2+ bilateral lower extremity edema, right greater than left.  She has right greater than left stasis changes without weeping at the skin at time of exam

## 2022-12-11 NOTE — Patient Instructions (Signed)
I would try taking torsemide, use the compression stockings during the day, and try to prop your foot up.  If you have a really itchy spot, you could use a small amount of triamcinolone cream.   Let me know how it goes.  Take care.  Glad to see you.

## 2022-12-12 NOTE — Assessment & Plan Note (Signed)
Would finish Keflex.  Continue using compression stockings.  Start taking torsemide as directed and asked her to let me know if that is not helping.  The neck step would likely be an Haematologist.  At this point still okay for outpatient follow-up.  Plan discussed with patient in detail.  30 minutes were devoted to patient care in this encounter (this includes time spent reviewing the patient's file/history, interviewing and examining the patient, counseling/reviewing plan with patient).

## 2022-12-21 ENCOUNTER — Telehealth: Payer: Self-pay

## 2022-12-21 NOTE — Telephone Encounter (Signed)
Noted. Thanks.

## 2022-12-21 NOTE — Telephone Encounter (Signed)
Did she try torsemide? The next option after torsemide may be an unna boot but that needs to be put on in clinic.

## 2022-12-21 NOTE — Telephone Encounter (Signed)
I spoke with Marzetta Board who is PT for [facility] and who is seeing pts husband. 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 Pt has finished the abx 3 days ago and Marzetta Board said pts rt lower leg is still very swollen, red and painful. Pain level is 3 or 4. Pt said there is still redness lower leg and ankle bone has area that has not healed over; raw skin the size of a silver dollar. Pt said the area is wet. No fever. Pt said she cannot leave her husband and does not have anyone that can stay with him for her to go to UC. UC & ED precautions given and pt voiced understanding.Pt request med sent to [facility] said Dr Damita Dunnings knew her situation and her husband is not doing well. Pt request cb after note reviewed by Dr Damita Dunnings. Sending note to Dr Damita Dunnings and Damita Dunnings pool and will let Janett Billow CMA be aware.

## 2022-12-21 NOTE — Telephone Encounter (Signed)
Called and spoke with patient. She has not taken torsemide yet; she was afraid it would make her dizzy and with taking care of her husband she did not want that. I advised patient to try it or can try taking half a tablet at first. Advised if that does not help over the weekend then we need to do a unna boot on her here. Also advised if she gets worse to go to UC this weekend. Patient states she will try the torsemide tomorrow morning; she does not want to have to get up at night to go to the bathroom all night.

## 2022-12-21 NOTE — Telephone Encounter (Signed)
Received note about patient in her husbands cahrt that [name] was treated for cheilitis and the redness was still there and swollen.  Dr. Damita Dunnings would like patient triaged.

## 2022-12-26 ENCOUNTER — Other Ambulatory Visit: Payer: Self-pay | Admitting: Internal Medicine

## 2022-12-26 NOTE — Telephone Encounter (Signed)
She was supposed to come back in for me to recheck her legs. Please set up appt as soon as possible

## 2022-12-26 NOTE — Telephone Encounter (Signed)
See below and set f/u when possible, either with me or Dr. Silvio Pate. Thanks.

## 2022-12-27 NOTE — Telephone Encounter (Signed)
Patient has been scheduled

## 2022-12-28 ENCOUNTER — Encounter: Payer: Self-pay | Admitting: Family Medicine

## 2022-12-28 ENCOUNTER — Ambulatory Visit (INDEPENDENT_AMBULATORY_CARE_PROVIDER_SITE_OTHER): Payer: PPO | Admitting: Family Medicine

## 2022-12-28 VITALS — BP 138/80 | HR 79 | Temp 97.0°F | Ht 64.0 in | Wt 194.0 lb

## 2022-12-28 DIAGNOSIS — I872 Venous insufficiency (chronic) (peripheral): Secondary | ICD-10-CM

## 2022-12-28 DIAGNOSIS — L299 Pruritus, unspecified: Secondary | ICD-10-CM

## 2022-12-28 NOTE — Patient Instructions (Addendum)
Try to leave the bandage on until 01/03/23.  Take it off then and let me know how your leg feels/looks.  Update me as needed in the meantime.  Use triamcinolone cream for itching on the arm and let me know if that isn't helping.   Take care.  Glad to see you.

## 2022-12-28 NOTE — Progress Notes (Unsigned)
She felt lightheaded with torsemide use.  Still with R leg edema.  Has used calamine cream in the meantime.  We talked about options going forward, specifically an Haematologist.  She is also had some itching noted on the arms and was asked about options.  Discussed using topical triamcinolone for that.  Meds, vitals, and allergies reviewed.   ROS: Per HPI unless specifically indicated in ROS section   Nad Ncat Rrr Ctab Medial R lower shin with chronic changes.  Erythema appears to be chronic with venous stasis changes with 20cm max length vertically.  Right greater than left lower extremity edema at baseline.

## 2022-12-30 DIAGNOSIS — L299 Pruritus, unspecified: Secondary | ICD-10-CM | POA: Insufficient documentation

## 2022-12-30 NOTE — Assessment & Plan Note (Signed)
Discussed using topical triamcinolone on her arms as needed and update me as needed.

## 2022-12-30 NOTE — Assessment & Plan Note (Signed)
Unna boot applied without complication and tolerated well.  Routine cautions given to patient.  She can leave it on for 1 week and then take it off and update me about her situation.  She is able to take it off at home.  If she does not tolerate in the meantime then she can take it off ahead of time.  She did not tolerate torsemide use.  I asked her to try to elevate her leg is much as possible.  She agrees with plan.

## 2023-01-01 ENCOUNTER — Encounter: Payer: Self-pay | Admitting: Family Medicine

## 2023-01-01 ENCOUNTER — Telehealth: Payer: Self-pay | Admitting: Family Medicine

## 2023-01-01 ENCOUNTER — Ambulatory Visit (INDEPENDENT_AMBULATORY_CARE_PROVIDER_SITE_OTHER): Payer: PPO | Admitting: Family Medicine

## 2023-01-01 VITALS — BP 140/80 | HR 73 | Temp 97.3°F | Ht 64.0 in | Wt 196.2 lb

## 2023-01-01 DIAGNOSIS — I872 Venous insufficiency (chronic) (peripheral): Secondary | ICD-10-CM | POA: Diagnosis not present

## 2023-01-01 DIAGNOSIS — L239 Allergic contact dermatitis, unspecified cause: Secondary | ICD-10-CM | POA: Diagnosis not present

## 2023-01-01 MED ORDER — PREDNISONE 20 MG PO TABS: ORAL_TABLET | ORAL | 0 refills | Status: DC

## 2023-01-01 NOTE — Patient Instructions (Signed)
Complete prednisone taper   Call if new fever, odor or increasing pain  in right leg, or if redness spreading.

## 2023-01-01 NOTE — Progress Notes (Signed)
Patient ID: [name], female    DOB: Jan 09, 1941, 82 y.o.   MRN: [id number]  This visit was conducted in person.  BP (!) 140/80   Pulse 73   Temp (!) 97.3 F (36.3 C) (Temporal)   Ht '5\' 4"'$  (1.626 m)   Wt 196 lb 4 oz (89 kg)   SpO2 99%   BMI 33.69 kg/m    CC:  Chief Complaint  Patient presents with   Rash   Venous Statis Dermatits    Right Ankle-Seen by Dr. Damita Dunnings on 12/28/22-Wrapped with Christoper Allegra and wonders if reaction was to that but rash has now spread to arms/back of legs    Subjective:   HPI: [name] is a 82 y.o. female presenting on 01/01/2023 for Rash and Venous Statis Dermatits (Right Ankle-Seen by Dr. Damita Dunnings on 12/28/22-Wrapped with Christoper Allegra and wonders if reaction was to that but rash has now spread to arms/back of legs)  Recently seen on 12/28/2022 by Dr. Damita Dunnings for venous stasis dermatitis right lower leg ( reviewed note)... at thattime unna boot was placed, also she had noted ithcing on bilateral arms and was given trimacinolone 0.1 %    Gradually 3-4 days later she noted rash on right ankle... it started itching.  She took off the [place] boot on day 4.  She scratched skin and further irritated the area.   Noted red bumps and itching on arms and legs and chest,  back. Burns like Estate agent.   Keeping her up at night.   No odor or fever.  She has not treated it with anything else.  Triamcinolone stops the itch on arms.      No diabetes.   Relevant past medical, surgical, family and social history reviewed and updated as indicated. Interim medical history since our last visit reviewed. Allergies and medications reviewed and updated. Outpatient Medications Prior to Visit  Medication Sig Dispense Refill   acetaminophen (TYLENOL) 325 MG tablet Take 2 tablets (650 mg total) by mouth 3 (three) times daily as needed.     hydrochlorothiazide (HYDRODIURIL) 25 MG tablet Take 1 tablet (25 mg total) by mouth daily. 30 tablet 0   ibuprofen (ADVIL) 200 MG  tablet Take 1-2 tablets (200-400 mg total) by mouth every 8 (eight) hours as needed (with food, for knee pain).     LORazepam (ATIVAN) 1 MG tablet Take 1/4-1/2 tablet by mouth every 8 hours as needed 30 tablet 0   Multiple Vitamins-Minerals (PRESERVISION AREDS 2 PO) Take by mouth.     triamcinolone cream (KENALOG) 0.1 % Apply 1 Application topically 2 (two) times daily. 454 g 0   No facility-administered medications prior to visit.     Per HPI unless specifically indicated in ROS section below Review of Systems  Constitutional:  Negative for fatigue and fever.  HENT:  Negative for congestion.   Eyes:  Negative for pain.  Respiratory:  Negative for cough and shortness of breath.   Cardiovascular:  Negative for chest pain, palpitations and leg swelling.  Gastrointestinal:  Negative for abdominal pain.  Genitourinary:  Negative for dysuria and vaginal bleeding.  Musculoskeletal:  Negative for back pain.  Skin:  Positive for rash.  Neurological:  Positive for light-headedness. Negative for syncope and headaches.  Psychiatric/Behavioral:  Negative for dysphoric mood.    Objective:  BP (!) 140/80   Pulse 73   Temp (!) 97.3 F (36.3 C) (Temporal)   Ht '5\' 4"'$  (1.626 m)   Wt 196  lb 4 oz (89 kg)   SpO2 99%   BMI 33.69 kg/m   Wt Readings from Last 3 Encounters:  01/01/23 196 lb 4 oz (89 kg)  12/28/22 194 lb (88 kg)  12/11/22 202 lb (91.6 kg)      Physical Exam Constitutional:      General: She is not in acute distress.    Appearance: Normal appearance. She is well-developed. She is obese. She is not ill-appearing or toxic-appearing.  HENT:     Head: Normocephalic.     Right Ear: Hearing, tympanic membrane, ear canal and external ear normal. Tympanic membrane is not erythematous, retracted or bulging.     Left Ear: Hearing, tympanic membrane, ear canal and external ear normal. Tympanic membrane is not erythematous, retracted or bulging.     Nose: No mucosal edema or rhinorrhea.      Right Sinus: No maxillary sinus tenderness or frontal sinus tenderness.     Left Sinus: No maxillary sinus tenderness or frontal sinus tenderness.     Mouth/Throat:     Pharynx: Uvula midline.  Eyes:     General: Lids are normal. Lids are everted, no foreign bodies appreciated.     Conjunctiva/sclera: Conjunctivae normal.     Pupils: Pupils are equal, round, and reactive to light.  Neck:     Thyroid: No thyroid mass or thyromegaly.     Vascular: No carotid bruit.     Trachea: Trachea normal.  Cardiovascular:     Rate and Rhythm: Normal rate and regular rhythm.     Pulses: Normal pulses.     Heart sounds: Normal heart sounds, S1 normal and S2 normal. No murmur heard.    No friction rub. No gallop.  Pulmonary:     Effort: Pulmonary effort is normal. No tachypnea or respiratory distress.     Breath sounds: Normal breath sounds. No decreased breath sounds, wheezing, rhonchi or rales.  Abdominal:     General: Bowel sounds are normal.     Palpations: Abdomen is soft.     Tenderness: There is no abdominal tenderness.  Musculoskeletal:     Cervical back: Normal range of motion and neck supple.     Right lower leg: 3+ Edema present.  Skin:    General: Skin is warm and dry.     Findings: No rash.  Neurological:     Mental Status: She is alert.  Psychiatric:        Mood and Affect: Mood is not anxious or depressed.        Speech: Speech normal.        Behavior: Behavior normal. Behavior is cooperative.        Thought Content: Thought content normal.        Judgment: Judgment normal.        Acute, she appears to    Results for orders placed or performed in visit on 06/25/22  WOUND CULTURE   Specimen: Blood  Result Value Ref Range   MICRO NUMBER: [id number]    SPECIMEN QUALITY: Adequate    SOURCE: R LOWER LEG WOUND    STATUS: FINAL    GRAM STAIN:      No organisms or white blood cells seen No epithelial cells seen   ISOLATE 1: Staphylococcus aureus (A)       Susceptibility    Staphylococcus aureus - AEROBIC CULT, GRAM STAIN POSITIVE 1    VANCOMYCIN <=0.5 Sensitive     CIPROFLOXACIN <=0.5 Sensitive     CLINDAMYCIN <=0.25  Sensitive     LEVOFLOXACIN <=0.12 Sensitive     ERYTHROMYCIN <=0.25 Sensitive     GENTAMICIN <=0.5 Sensitive     OXACILLIN* <=0.25 Sensitive      * Oxacillin susceptible staphylococci are susceptible to other penicillinase-stable penicillins (e.g., methicillin, nafcillin), beta- lactam/beta-lactamase inhibitor combinations, and cephems with staphylococcal indications, including cefazolin.     TETRACYCLINE <=1 Sensitive     TRIMETH/SULFA* <=10 Sensitive      * Oxacillin susceptible staphylococci are susceptible to other penicillinase-stable penicillins (e.g., methicillin, nafcillin), beta- lactam/beta-lactamase inhibitor combinations, and cephems with staphylococcal indications, including cefazolin. Legend: S = Susceptible  I = Intermediate R = Resistant  NS = Not susceptible * = Not tested  NR = Not reported **NN = See antimicrobic comments   Comprehensive metabolic panel  Result Value Ref Range   Sodium 142 135 - 145 mEq/L   Potassium 4.1 3.5 - 5.1 mEq/L   Chloride 104 96 - 112 mEq/L   CO2 27 19 - 32 mEq/L   Glucose, Bld 96 70 - 99 mg/dL   BUN 19 6 - 23 mg/dL   Creatinine, Ser 1.08 0.40 - 1.20 mg/dL   Total Bilirubin 0.8 0.2 - 1.2 mg/dL   Alkaline Phosphatase 98 39 - 117 U/L   AST 15 0 - 37 U/L   ALT 10 0 - 35 U/L   Total Protein 6.8 6.0 - 8.3 g/dL   Albumin 4.3 3.5 - 5.2 g/dL   GFR 47.75 (L) >60.00 mL/min   Calcium 9.6 8.4 - 10.5 mg/dL  CBC  Result Value Ref Range   WBC 7.8 4.0 - 10.5 K/uL   RBC 4.53 3.87 - 5.11 Mil/uL   Platelets 247.0 150.0 - 400.0 K/uL   Hemoglobin 13.6 12.0 - 15.0 g/dL   HCT 40.3 36.0 - 46.0 %   MCV 88.9 78.0 - 100.0 fl   MCHC 33.7 30.0 - 36.0 g/dL   RDW 13.3 11.5 - 15.5 %    Assessment and Plan  Allergic dermatitis Assessment & Plan: Have had a reaction to the Unna boot locally on her  right lower leg as well as some systemic response with rash on the remainder of her body. Will treat with prednisone taper No clear bacterial infection, but return and ER precautions were provided. I encouraged her to elevate her right leg above her heart..  Close follow-up with her PCP for reevaluation in 2 days.   Venous stasis dermatitis, unspecified laterality Assessment & Plan: Encouraged her to elevate her leg as often as possible given she is intolerant of diuretics and now likely allergy to [employer].   Other orders -     predniSONE; 3 tabs by mouth daily x 3 days, then 2 tabs by mouth daily x 2 days then 1 tab by mouth daily x 2 days  Dispense: 15 tablet; Refill: 0    Return in about 2 days (around 01/03/2023) for  Follow up rash with PCP Dr. Damita Dunnings.   Eliezer Lofts, MD

## 2023-01-01 NOTE — Assessment & Plan Note (Signed)
Encouraged her to elevate her leg as often as possible given she is intolerant of diuretics and now likely allergy to [employer].

## 2023-01-01 NOTE — Telephone Encounter (Signed)
Dr. Damita Dunnings this pt saw Dr. Diona Browner today and she wanted the pt to come back to see you in 2 days and you did not have anything so I rescheduled her with Dr. Diona Browner (per Amy advice).

## 2023-01-01 NOTE — Assessment & Plan Note (Signed)
Have had a reaction to the Unna boot locally on her right lower leg as well as some systemic response with rash on the remainder of her body. Will treat with prednisone taper No clear bacterial infection, but return and ER precautions were provided. I encouraged her to elevate her right leg above her heart..  Close follow-up with her PCP for reevaluation in 2 days.

## 2023-01-01 NOTE — Telephone Encounter (Signed)
Noted. Thanks.

## 2023-01-03 ENCOUNTER — Ambulatory Visit (INDEPENDENT_AMBULATORY_CARE_PROVIDER_SITE_OTHER): Payer: PPO | Admitting: Family Medicine

## 2023-01-03 ENCOUNTER — Encounter: Payer: Self-pay | Admitting: Family Medicine

## 2023-01-03 VITALS — BP 138/78 | HR 70 | Temp 98.0°F | Ht 64.0 in | Wt 197.0 lb

## 2023-01-03 DIAGNOSIS — R600 Localized edema: Secondary | ICD-10-CM

## 2023-01-03 DIAGNOSIS — I872 Venous insufficiency (chronic) (peripheral): Secondary | ICD-10-CM

## 2023-01-03 DIAGNOSIS — R609 Edema, unspecified: Secondary | ICD-10-CM

## 2023-01-03 DIAGNOSIS — L239 Allergic contact dermatitis, unspecified cause: Secondary | ICD-10-CM | POA: Diagnosis not present

## 2023-01-03 MED ORDER — POTASSIUM CHLORIDE CRYS ER 20 MEQ PO TBCR: 20.0000 meq | EXTENDED_RELEASE_TABLET | Freq: Every day | ORAL | 0 refills | Status: DC

## 2023-01-03 MED ORDER — FUROSEMIDE 20 MG PO TABS: 20.0000 mg | ORAL_TABLET | Freq: Every day | ORAL | 0 refills | Status: DC

## 2023-01-03 NOTE — Assessment & Plan Note (Signed)
Swelling in right leg continues.  She states she is really unable to elevate it but is willing to do so more now.  She is also willing now to wear compression hose. We will start furosemide 20 mg p.o. daily along with matching potassium over the next 3 to 5 days to help with getting rid of fluid.  She will hold hydrochlorothiazide while she is on it.  Hopefully along with elevation and her beginning to wear compression hose regularly this will help the edema in her leg improved/resolved so that the associated scratches and wounds can continue to heal.

## 2023-01-03 NOTE — Patient Instructions (Addendum)
Elevate legs above heart.  Complete prednisone taper.  Hold HCTZ.  Start  furosemide 20 mg  daily along with potassium x  3-5 days.  Start wearing compression hose daily.  Call with update  next week.

## 2023-01-03 NOTE — Progress Notes (Signed)
Patient ID: [name], female    DOB: 02-12-1941, 82 y.o.   MRN: [id number]  This visit was conducted in person.  BP 138/78   Pulse 70   Temp 98 F (36.7 C) (Temporal)   Ht '5\' 4"'$  (1.626 m)   Wt 197 lb (89.4 kg)   SpO2 100%   BMI 33.81 kg/m    CC:  Chief Complaint  Patient presents with   Rash    Follow up-Started the Prednisone but did not talk the fluid pill    Subjective:   HPI: [name] is a 82 y.o. female presenting on 01/03/2023 for Rash (Follow up-Started the Prednisone but did not talk the fluid pill)  Recent OV for rash  on right lower leg.. and on torso.. felt to be like allergic dermatitis.  Treated with prednisone taper on day 3/7   She is on HCTZ .Marland Kitchen In past intolerant of torsemide... made her dizzy.       Relevant past medical, surgical, family and social history reviewed and updated as indicated. Interim medical history since our last visit reviewed. Allergies and medications reviewed and updated. Outpatient Medications Prior to Visit  Medication Sig Dispense Refill   acetaminophen (TYLENOL) 325 MG tablet Take 2 tablets (650 mg total) by mouth 3 (three) times daily as needed.     hydrochlorothiazide (HYDRODIURIL) 25 MG tablet Take 1 tablet (25 mg total) by mouth daily. 30 tablet 0   ibuprofen (ADVIL) 200 MG tablet Take 1-2 tablets (200-400 mg total) by mouth every 8 (eight) hours as needed (with food, for knee pain).     LORazepam (ATIVAN) 1 MG tablet Take 1/4-1/2 tablet by mouth every 8 hours as needed 30 tablet 0   Multiple Vitamins-Minerals (PRESERVISION AREDS 2 PO) Take by mouth.     predniSONE (DELTASONE) 20 MG tablet 3 tabs by mouth daily x 3 days, then 2 tabs by mouth daily x 2 days then 1 tab by mouth daily x 2 days 15 tablet 0   triamcinolone cream (KENALOG) 0.1 % Apply 1 Application topically 2 (two) times daily. 454 g 0   No facility-administered medications prior to visit.     Per HPI unless specifically indicated in ROS  section below Review of Systems  Constitutional:  Negative for fatigue and fever.  HENT:  Negative for congestion.   Eyes:  Negative for pain.  Respiratory:  Negative for cough and shortness of breath.   Cardiovascular:  Positive for leg swelling. Negative for chest pain and palpitations.  Gastrointestinal:  Negative for abdominal pain.  Genitourinary:  Negative for dysuria and vaginal bleeding.  Musculoskeletal:  Negative for back pain.  Skin:  Positive for rash.  Neurological:  Negative for syncope, light-headedness and headaches.  Psychiatric/Behavioral:  Negative for dysphoric mood.    Objective:  BP 138/78   Pulse 70   Temp 98 F (36.7 C) (Temporal)   Ht '5\' 4"'$  (1.626 m)   Wt 197 lb (89.4 kg)   SpO2 100%   BMI 33.81 kg/m   Wt Readings from Last 3 Encounters:  01/03/23 197 lb (89.4 kg)  01/01/23 196 lb 4 oz (89 kg)  12/28/22 194 lb (88 kg)      Physical Exam Constitutional:      General: She is not in acute distress.    Appearance: Normal appearance. She is well-developed. She is not ill-appearing or toxic-appearing.  HENT:     Head: Normocephalic.     Right Ear: Hearing,  tympanic membrane, ear canal and external ear normal. Tympanic membrane is not erythematous, retracted or bulging.     Left Ear: Hearing, tympanic membrane, ear canal and external ear normal. Tympanic membrane is not erythematous, retracted or bulging.     Nose: No mucosal edema or rhinorrhea.     Right Sinus: No maxillary sinus tenderness or frontal sinus tenderness.     Left Sinus: No maxillary sinus tenderness or frontal sinus tenderness.     Mouth/Throat:     Pharynx: Uvula midline.  Eyes:     General: Lids are normal. Lids are everted, no foreign bodies appreciated.     Conjunctiva/sclera: Conjunctivae normal.     Pupils: Pupils are equal, round, and reactive to light.  Neck:     Thyroid: No thyroid mass or thyromegaly.     Vascular: No carotid bruit.     Trachea: Trachea normal.   Cardiovascular:     Rate and Rhythm: Normal rate and regular rhythm.     Pulses: Normal pulses.     Heart sounds: Normal heart sounds, S1 normal and S2 normal. No murmur heard.    No friction rub. No gallop.  Pulmonary:     Effort: Pulmonary effort is normal. No tachypnea or respiratory distress.     Breath sounds: Normal breath sounds. No decreased breath sounds, wheezing, rhonchi or rales.  Abdominal:     General: Bowel sounds are normal.     Palpations: Abdomen is soft.     Tenderness: There is no abdominal tenderness.  Musculoskeletal:     Cervical back: Normal range of motion and neck supple.  Skin:    General: Skin is warm and dry.     Findings: No rash.     Comments: Significant improvement in redness in right lower leg, continued clear discharge from healing wounds on right lower leg.  Rash on chest and back resolving  Neurological:     Mental Status: She is alert.  Psychiatric:        Mood and Affect: Mood is not anxious or depressed.        Speech: Speech normal.        Behavior: Behavior normal. Behavior is cooperative.        Thought Content: Thought content normal.        Judgment: Judgment normal.       Results for orders placed or performed in visit on 06/25/22  WOUND CULTURE   Specimen: Blood  Result Value Ref Range   MICRO NUMBER: [id number]    SPECIMEN QUALITY: Adequate    SOURCE: R LOWER LEG WOUND    STATUS: FINAL    GRAM STAIN:      No organisms or white blood cells seen No epithelial cells seen   ISOLATE 1: Staphylococcus aureus (A)       Susceptibility   Staphylococcus aureus - AEROBIC CULT, GRAM STAIN POSITIVE 1    VANCOMYCIN <=0.5 Sensitive     CIPROFLOXACIN <=0.5 Sensitive     CLINDAMYCIN <=0.25 Sensitive     LEVOFLOXACIN <=0.12 Sensitive     ERYTHROMYCIN <=0.25 Sensitive     GENTAMICIN <=0.5 Sensitive     OXACILLIN* <=0.25 Sensitive      * Oxacillin susceptible staphylococci are susceptible to other penicillinase-stable penicillins (e.g.,  methicillin, nafcillin), beta- lactam/beta-lactamase inhibitor combinations, and cephems with staphylococcal indications, including cefazolin.     TETRACYCLINE <=1 Sensitive     TRIMETH/SULFA* <=10 Sensitive      * Oxacillin susceptible staphylococci are susceptible  to other penicillinase-stable penicillins (e.g., methicillin, nafcillin), beta- lactam/beta-lactamase inhibitor combinations, and cephems with staphylococcal indications, including cefazolin. Legend: S = Susceptible  I = Intermediate R = Resistant  NS = Not susceptible * = Not tested  NR = Not reported **NN = See antimicrobic comments   Comprehensive metabolic panel  Result Value Ref Range   Sodium 142 135 - 145 mEq/L   Potassium 4.1 3.5 - 5.1 mEq/L   Chloride 104 96 - 112 mEq/L   CO2 27 19 - 32 mEq/L   Glucose, Bld 96 70 - 99 mg/dL   BUN 19 6 - 23 mg/dL   Creatinine, Ser 1.08 0.40 - 1.20 mg/dL   Total Bilirubin 0.8 0.2 - 1.2 mg/dL   Alkaline Phosphatase 98 39 - 117 U/L   AST 15 0 - 37 U/L   ALT 10 0 - 35 U/L   Total Protein 6.8 6.0 - 8.3 g/dL   Albumin 4.3 3.5 - 5.2 g/dL   GFR 47.75 (L) >60.00 mL/min   Calcium 9.6 8.4 - 10.5 mg/dL  CBC  Result Value Ref Range   WBC 7.8 4.0 - 10.5 K/uL   RBC 4.53 3.87 - 5.11 Mil/uL   Platelets 247.0 150.0 - 400.0 K/uL   Hemoglobin 13.6 12.0 - 15.0 g/dL   HCT 40.3 36.0 - 46.0 %   MCV 88.9 78.0 - 100.0 fl   MCHC 33.7 30.0 - 36.0 g/dL   RDW 13.3 11.5 - 15.5 %    Assessment and Plan  Allergic dermatitis Assessment & Plan: Acute, allergic reaction to [employer] material seems to have improved significantly with prednisone taper.  She will complete this course.  Following completion of the prednisone she can continue using triamcinolone twice daily on any remaining itchy areas.  She can use it on the dry flaky intact skin on her right lower leg but I have told her to hold off on applying to open wounds.   Venous stasis dermatitis, unspecified laterality  Edema,  unspecified type  Pedal edema Assessment & Plan: Swelling in right leg continues.  She states she is really unable to elevate it but is willing to do so more now.  She is also willing now to wear compression hose. We will start furosemide 20 mg p.o. daily along with matching potassium over the next 3 to 5 days to help with getting rid of fluid.  She will hold hydrochlorothiazide while she is on it.  Hopefully along with elevation and her beginning to wear compression hose regularly this will help the edema in her leg improved/resolved so that the associated scratches and wounds can continue to heal.   Other orders -     Furosemide; Take 1 tablet (20 mg total) by mouth daily.  Dispense: 10 tablet; Refill: 0 -     Potassium Chloride Crys ER; Take 1 tablet (20 mEq total) by mouth daily.  Dispense: 10 tablet; Refill: 0    No follow-ups on file.   Eliezer Lofts, MD

## 2023-01-03 NOTE — Assessment & Plan Note (Signed)
Acute, allergic reaction to [employer] seems to have improved significantly with prednisone taper.  She will complete this course.  Following completion of the prednisone she can continue using triamcinolone twice daily on any remaining itchy areas.  She can use it on the dry flaky intact skin on her right lower leg but I have told her to hold off on applying to open wounds.

## 2023-01-15 DIAGNOSIS — I872 Venous insufficiency (chronic) (peripheral): Secondary | ICD-10-CM | POA: Diagnosis not present

## 2023-01-15 DIAGNOSIS — R600 Localized edema: Secondary | ICD-10-CM | POA: Diagnosis not present

## 2023-01-16 ENCOUNTER — Encounter: Payer: PPO | Attending: Internal Medicine | Admitting: Internal Medicine

## 2023-01-16 DIAGNOSIS — L03115 Cellulitis of right lower limb: Secondary | ICD-10-CM | POA: Diagnosis not present

## 2023-01-16 DIAGNOSIS — M797 Fibromyalgia: Secondary | ICD-10-CM | POA: Insufficient documentation

## 2023-01-16 DIAGNOSIS — I872 Venous insufficiency (chronic) (peripheral): Secondary | ICD-10-CM | POA: Diagnosis not present

## 2023-01-16 DIAGNOSIS — I10 Essential (primary) hypertension: Secondary | ICD-10-CM | POA: Diagnosis not present

## 2023-01-16 DIAGNOSIS — L97811 Non-pressure chronic ulcer of other part of right lower leg limited to breakdown of skin: Secondary | ICD-10-CM | POA: Diagnosis not present

## 2023-01-18 NOTE — Progress Notes (Signed)
ALLECIA, [name] ([id number]) [id number] Page 1 of 8 Visit Report for 01/16/2023 Chief Complaint Document Details Patient Name: Date of Service: [name], [name] 01/16/2023 7:45 A M Medical Record Number: [id number] Patient Account Number: [id number] Date of Birth/Sex: Treating RN: 04-Jul-1941 (82 y.o. [name] Primary Care Provider: Elsie [name] Other Clinician: Referring Provider: Treating Provider/Extender: [name] SA [name], [name] Weeks in Treatment: 0 Information Obtained from: Patient Chief Complaint 01/16/2023; increased warmth and erythema to the right lower extremity with scattered areas of skin breakdown Electronic Signature(s) Signed: 01/16/2023 9:22:15 AM By: [name] Shan DO Entered By: [name] on 01/16/2023 08:49:34 -------------------------------------------------------------------------------- HPI Details Patient Name: Date of Service: [name], [name] 01/16/2023 7:45 A M Medical Record Number: [id number] Patient Account Number: [id number] Date of Birth/Sex: Treating RN: 05-29-1941 (82 y.o. [name] Primary Care Provider: Elsie [name] Other Clinician: Referring Provider: Treating Provider/Extender: [name] SA [name], [name] Weeks in Treatment: 0 History of Present Illness HPI Description: 08/31/2021 upon evaluation today patient presents for initial inspection here in our clinic concerning issues that she is having with a wound on her left anterior lower extremity. This is actually an area that she unfortunately tells me she was picking up a cage and dropped hitting the anterior portion of her shin causing a skin tear. She did not go to the hospital due to the fact that she did not realize that she would need to. This happened on July 30, 2021. With that being said the patient has been on Augmentin she has about 1 and half days of this left. Nonetheless she tells me that she does have  a longstanding history of leg swelling. She does have chronic venous insufficiency as well it appears to me. She also has some evidence of may be early stages of lymphedema. She has a history of fibromyalgia, bilateral knee osteoarthritis, and appears to have a fungal infection of the right ankle region. In regard to the arthritis she tells me her legs tend to swell less after she gets her knee injections allowing her to be able to walk and get around much more effectively. Following the injections she does well for a while and then when that seems to be wearing off she tends to start having issues with more swelling because she is more mobile that makes sense from a venous standpoint. 10/10; I was asked to see this patient today who came in for a nurse visit. Firstly she has developed a widespread very pruritic rash involving her abdomen and back. Lesser extent on her feet. The second problem is that she is complaining about the tightness of the wound and wrap on the left leg where the wound is. 09/14/2021 upon evaluation today patient appears to be doing decently well in regard to her leg ulcer. The biggest issue she is having is a significant issue with a rash currently. I do not see any signs of active infection systemically which is great news although with regard to the rash she did see dermatology they told her she had eczema that does not seem to sit right with me out think that is mainly the issues she has going on. I do think she may benefit from the use of a NHYLA, WILMOUTH L ([id number]) [id number] Page 2 of 8 short course of prednisone which should help with the rash which is absolutely driving her crazy. 09/21/2021 upon evaluation today patient appears to be doing decently well in regard to her wound this  is still pretty deep. The prednisone has helped with the rash although is not completely done and it is doing much better than what it was. Fortunately  there is no signs of active infection systemically at this time also great news. 09/28/2021 upon evaluation today patient's wound on her leg actually showing some signs of improvement which is good news I am happy in that regard. Fortunately there does not appear to be any evidence of active infection systemically which is great news. Unfortunately she is still continuing to have issues with her rash which is almost worse than the actual wound itself for her at this point. 10/05/2021 upon evaluation today patient appears to be doing well with regard to her leg ulcer. She is tolerating the dressing changes without complication and overall I am extremely pleased with where we stand. With that being said I do think that she could possibly benefit from a snap VAC if we can gain approval I am not certain if this will be improved or not. Nonetheless I definitely think we can give this a try and if we get approval I think this will help speed up the healing process to be honest. In the interim I do believe the [facility] is doing decently well. 10/10/2021 upon evaluation today patient appears to be doing well with regard to her wound although is not significantly smaller. We still have quite a bit of depth here. Fortunately there is no signs of active infection systemically at this time. No fevers, chills, nausea, vomiting, or diarrhea. 10/17/2021 upon evaluation today patient appears to be doing poorly in regard to her leg compared to last time there is a lot of irritation today. She actually kept the Zetuvit on all week which does not seem to have been beneficial for her. She was supposed to change it at home we did order her supplies but she did not even find the supplies on her porch until she tells me yesterday. Either way we had already decided that the best thing was probably can to be to have her come in for nurse visits. I think that still probably the best thing to do I feel like there is a bit  of dementia here that I was not aware of last week or even the previous visit to be honest that is going to prohibit her from being able to make these dressing changes herself. 10/27/2021 upon evaluation today patient appears to be doing well with regard to her wound currently. She is actually showing signs of excellent improvement and I do feel like the collagen is doing a great job. I am extremely pleased with where we stand today. 11/03/2021 upon evaluation patient's wound bed actually showed signs of good granulation epithelization at this point. I am actually extremely pleased with where we stand and how this is progressing I think the patient is making great progress. She is very happy with today's findings. 12/27. This is a patient with wounds on her left lateral lower extremity. She has 2 open wounds. She did not tolerate 3 layer compression we have therefore been using Tubigrip. We use dermacol last week but we have endoform to use this week 12/04/2021 upon evaluation today patient appears to be doing well with regard to her wound. There is still some irritation around where she has a small area that she bumped inferior to where the original wound was. This is not appearing to be too significant at this point which is good news. 12/07/2020 upon  evaluation today patient appears to be doing well with regard to her wounds actually look much better even than earlier this week when I saw her. They are measuring smaller. Unfortunately she does appear to have some cellulitis of the leg in general. There is really nothing significant that I can culture here so I am just can have to treat prophylactically and see if we can get this under control. The patient voiced understanding and does realize what is going on as well as we discussed that today. 1/25; patient presents for follow-up. She reports finishing her course of antibiotics. She reports improvement in her symptoms. She is only able to tolerate  the compression wrap for a few days before she takes this off herself. It is unclear if she is dressing the wound when she takes the compression wraps off. She is scheduled for her venous reflux studies tomorrow. She currently denies signs of infection. 12/29/2021 upon evaluation today patient actually appears to be completely healed which is great news. She has been using her compression socks which is good. Fortunately I do not see any signs of active infection locally nor systemically at this time. 01/16/2023 Ms. Penne Braman is an 82 year old female with a past medical history of essential hypertension, venous insufficiency and fibromyalgia that presents to the clinic for a 1-59-monthhistory of increased warmth and erythema to the right lower extremity with areas of skin breakdown. Looking through the EMR she has seen her primary care office for this issue and has been treated with Keflex, Unna boot and she is also been started on a diuretic. When discussing this with her she seemed unaware of any of the treatments she has had except for the Unna boot for which she states she had an allergic reaction. She currently reports pain to the areas of skin breakdown to the right lower extremity. She has increased warmth and erythema to this area. She has compression stockings but is not wearing them. She currently denies fever/chills, nausea/vomiting. Electronic Signature(s) Signed: 01/16/2023 9:22:15 AM By: HKalman ShanDO Entered By: HKalman Shanon 01/16/2023 08:55:34 -------------------------------------------------------------------------------- Physical Exam Details Patient Name: Date of Service: [name], [name] 01/16/2023 7:45 A M Medical Record Number: [id number] Account Number: [id number] of Birth/Sex: Treating RN: [birth year]((82y.o. FMarlowe ShoresPrimary Care Provider: DElsie StainOther Clinician: Referring Provider: Treating Provider/Extender: HKalman ShanSA [name], [name] Weeks in Treatment: 0 Constitutional .CONSEPCION, ZENI([id number] Page 3 of 8 Cardiovascular . Psychiatric . Notes Right lower extremity: 2+ pitting edema to the knee. Increased warmth and erythema to the distal leg up to the knee. Several scattered areas limited to skin breakdown. No purulent drainage. Electronic Signature(s) Signed: 01/16/2023 9:22:15 AM By: HKalman ShanDO Entered By: HKalman Shanon 01/16/2023 08:56:36 -------------------------------------------------------------------------------- Physician Orders Details Patient Name: Date of Service: [name], [name] 01/16/2023 7:45 A M Medical Record Number: [id number] Account Number: [id number] of Birth/Sex: Treating RN: [id number]((82y.o. FOrvan FalconerPrimary Care Provider: DElsie StainOther Clinician: Referring Provider: Treating Provider/Extender: HKalman ShanSA [name], [name] Weeks in Treatment: 0 Verbal / Phone Orders: No Diagnosis Coding Follow-up Appointments Return Appointment in 1 week. Bathing/ Shower/ Hygiene May shower; gently cleanse wound with antibacterial soap, rinse and pat dry prior to dressing wounds Edema Control - Lymphedema / Segmental Compressive Device / Other Tubigrip single layer applied. - muprirocin to weeping areas , tubi grip size E apply daily Elevate, Exercise Daily  and A void Standing for Long Periods of Time. Elevate legs to the level of the heart and pump ankles as often as possible Elevate leg(s) parallel to the floor when sitting. Patient Medications llergies: sulfur, Macrodantin A Notifications Medication Indication Start End 01/16/2023 clindamycin HCl DOSE 1 - oral 300 mg capsule - 1 capsule oral four times daily x 10 days 01/16/2023 mupirocin DOSE 1 - topical 2 % ointment - 1 ointment topical once daily Electronic Signature(s) Signed: 01/16/2023 9:22:15 AM By:  [name] Shan DO Previous Signature: 01/16/2023 9:01:19 AM Version By: [name] Shan DO Entered By: [name] on 01/16/2023 [place], [name] ([id number]) [id number] Page 4 of 8 -------------------------------------------------------------------------------- Problem List Details Patient Name: Date of Service: [name], [name] 01/16/2023 7:45 A M Medical Record Number: [id number] Patient Account Number: [id number] Date of Birth/Sex: Treating RN: 11-24-1941 (82 y.o. [name] Primary Care Provider: Elsie [name] Other Clinician: Referring Provider: Treating Provider/Extender: [name] SA [name], [name] Weeks in Treatment: 0 Active Problems ICD-10 Encounter Code Description Active Date MDM Diagnosis L03.115 Cellulitis of right lower limb 01/16/2023 No Yes L97.811 Non-pressure chronic ulcer of other part of right lower leg limited to breakdown 01/16/2023 No Yes of skin Inactive Problems Resolved Problems Electronic Signature(s) Signed: 01/16/2023 9:22:15 AM By: [name] Shan DO Entered By: [name] on 01/16/2023 08:48:58 -------------------------------------------------------------------------------- Progress Note Details Patient Name: Date of Service: [name], [name] 01/16/2023 7:45 A M Medical Record Number: [id number] Patient Account Number: [id number] Date of Birth/Sex: Treating RN: 02/06/1941 (82 y.o. [name] Primary Care Provider: Elsie [name] Other Clinician: Referring Provider: Treating Provider/Extender: [name] SA [name], [name] Weeks in Treatment: 0 Subjective Chief Complaint Information obtained from Patient 01/16/2023; increased warmth and erythema to the right lower extremity with scattered areas of skin breakdown History of Present Illness (HPI) Neidert, Kamillah L ([id number]) [id number] Page 5 of 8 08/31/2021 upon evaluation today  patient presents for initial inspection here in our clinic concerning issues that she is having with a wound on her left anterior lower extremity. This is actually an area that she unfortunately tells me she was picking up a cage and dropped hitting the anterior portion of her shin causing a skin tear. She did not go to the hospital due to the fact that she did not realize that she would need to. This happened on July 30, 2021. With that being said the patient has been on Augmentin she has about 1 and half days of this left. Nonetheless she tells me that she does have a longstanding history of leg swelling. She does have chronic venous insufficiency as well it appears to me. She also has some evidence of may be early stages of lymphedema. She has a history of fibromyalgia, bilateral knee osteoarthritis, and appears to have a fungal infection of the right ankle region. In regard to the arthritis she tells me her legs tend to swell less after she gets her knee injections allowing her to be able to walk and get around much more effectively. Following the injections she does well for a while and then when that seems to be wearing off she tends to start having issues with more swelling because she is more mobile that makes sense from a venous standpoint. 10/10; I was asked to see this patient today who came in for a nurse visit. Firstly she has developed a widespread very pruritic rash involving her abdomen and back. Lesser extent on her feet. The second problem is  that she is complaining about the tightness of the wound and wrap on the left leg where the wound is. 09/14/2021 upon evaluation today patient appears to be doing decently well in regard to her leg ulcer. The biggest issue she is having is a significant issue with a rash currently. I do not see any signs of active infection systemically which is great news although with regard to the rash she did see dermatology they told her she had eczema  that does not seem to sit right with me out think that is mainly the issues she has going on. I do think she may benefit from the use of a short course of prednisone which should help with the rash which is absolutely driving her crazy. 09/21/2021 upon evaluation today patient appears to be doing decently well in regard to her wound this is still pretty deep. The prednisone has helped with the rash although is not completely done and it is doing much better than what it was. Fortunately there is no signs of active infection systemically at this time also great news. 09/28/2021 upon evaluation today patient's wound on her leg actually showing some signs of improvement which is good news I am happy in that regard. Fortunately there does not appear to be any evidence of active infection systemically which is great news. Unfortunately she is still continuing to have issues with her rash which is almost worse than the actual wound itself for her at this point. 10/05/2021 upon evaluation today patient appears to be doing well with regard to her leg ulcer. She is tolerating the dressing changes without complication and overall I am extremely pleased with where we stand. With that being said I do think that she could possibly benefit from a snap VAC if we can gain approval I am not certain if this will be improved or not. Nonetheless I definitely think we can give this a try and if we get approval I think this will help speed up the healing process to be honest. In the interim I do believe the [facility] is doing decently well. 10/10/2021 upon evaluation today patient appears to be doing well with regard to her wound although is not significantly smaller. We still have quite a bit of depth here. Fortunately there is no signs of active infection systemically at this time. No fevers, chills, nausea, vomiting, or diarrhea. 10/17/2021 upon evaluation today patient appears to be doing poorly in regard to her leg  compared to last time there is a lot of irritation today. She actually kept the Zetuvit on all week which does not seem to have been beneficial for her. She was supposed to change it at home we did order her supplies but she did not even find the supplies on her porch until she tells me yesterday. Either way we had already decided that the best thing was probably can to be to have her come in for nurse visits. I think that still probably the best thing to do I feel like there is a bit of dementia here that I was not aware of last week or even the previous visit to be honest that is going to prohibit her from being able to make these dressing changes herself. 10/27/2021 upon evaluation today patient appears to be doing well with regard to her wound currently. She is actually showing signs of excellent improvement and I do feel like the collagen is doing a great job. I am extremely pleased with where we  stand today. 11/03/2021 upon evaluation patient's wound bed actually showed signs of good granulation epithelization at this point. I am actually extremely pleased with where we stand and how this is progressing I think the patient is making great progress. She is very happy with today's findings. 12/27. This is a patient with wounds on her left lateral lower extremity. She has 2 open wounds. She did not tolerate 3 layer compression we have therefore been using Tubigrip. We use dermacol last week but we have endoform to use this week 12/04/2021 upon evaluation today patient appears to be doing well with regard to her wound. There is still some irritation around where she has a small area that she bumped inferior to where the original wound was. This is not appearing to be too significant at this point which is good news. 12/07/2020 upon evaluation today patient appears to be doing well with regard to her wounds actually look much better even than earlier this week when I saw her. They are measuring smaller.  Unfortunately she does appear to have some cellulitis of the leg in general. There is really nothing significant that I can culture here so I am just can have to treat prophylactically and see if we can get this under control. The patient voiced understanding and does realize what is going on as well as we discussed that today. 1/25; patient presents for follow-up. She reports finishing her course of antibiotics. She reports improvement in her symptoms. She is only able to tolerate the compression wrap for a few days before she takes this off herself. It is unclear if she is dressing the wound when she takes the compression wraps off. She is scheduled for her venous reflux studies tomorrow. She currently denies signs of infection. 12/29/2021 upon evaluation today patient actually appears to be completely healed which is great news. She has been using her compression socks which is good. Fortunately I do not see any signs of active infection locally nor systemically at this time. 01/16/2023 [name] is an 82 year old female with a past medical history of essential hypertension, venous insufficiency and fibromyalgia that presents to the clinic for a 1-22-monthhistory of increased warmth and erythema to the right lower extremity with areas of skin breakdown. Looking through the EMR she has seen her primary care office for this issue and has been treated with Keflex, Unna boot and she is also been started on a diuretic. When discussing this with her she seemed unaware of any of the treatments she has had except for the Unna boot for which she states she had an allergic reaction. She currently reports pain to the areas of skin breakdown to the right lower extremity. She has increased warmth and erythema to this area. She has compression stockings but is not wearing them. She currently denies fever/chills, nausea/vomiting. Patient History Information obtained from Patient. Allergies sulfur,  Macrodantin Social History Never smoker, Marital Status - Married, Alcohol Use - Never, Drug Use - No History, Caffeine Use - Daily. Medical History Eyes Patient has history of Cataracts Endocrine Denies history of Type I Diabetes, Type II Diabetes Musculoskeletal Patient has history of Osteoarthritis Oncologic Patient has history of Received Radiation CSHADA, SHOLTZ([id number] Page 6 of 8 Medical A Surgical History Notes nd Eyes macular degeneration Oncologic Lumpectomy-about 20 years ago Review of Systems (ROS) Endocrine Denies complaints or symptoms of Hepatitis, Thyroid disease, Polydypsia (Excessive Thirst). Oncologic 2001 Lumpectomy Objective Constitutional Vitals Time Taken: 7:55 AM, Height: 64  in, Weight: 202 lbs, BMI: 34.7, Temperature: 97.5 F, Pulse: 75 bpm, Respiratory Rate: 16 breaths/min, Blood Pressure: 172/89 mmHg. General Notes: Right lower extremity: 2+ pitting edema to the knee. Increased warmth and erythema to the distal leg up to the knee. Several scattered areas limited to skin breakdown. No purulent drainage. Assessment Active Problems ICD-10 Cellulitis of right lower limb Non-pressure chronic ulcer of other part of right lower leg limited to breakdown of skin Patient presents with a 1-14-monthhistory of cellulitis to her right lower extremity. She has scattered open areas limited to skin breakdown and I recommended using mupirocin ointment to these areas. At this time I will go ahead and prescribe her clindamycin as she had little benefit with Keflex. I recommended wearing her compression stockings. We gave her Tubigrip in office to use temporarily. Follow-up in 1 week. She knows to call with any questions or concerns. She is to go to the ED if she develops worsening symptoms. Plan Follow-up Appointments: Return Appointment in 1 week. Bathing/ Shower/ Hygiene: May shower; gently cleanse wound with  antibacterial soap, rinse and pat dry prior to dressing wounds Edema Control - Lymphedema / Segmental Compressive Device / Other: Tubigrip single layer applied. - muprirocin to weeping areas , tubi grip size E apply daily Elevate, Exercise Daily and Avoid Standing for Long Periods of Time. Elevate legs to the level of the heart and pump ankles as often as possible Elevate leg(s) parallel to the floor when sitting. 1. Mupirocin ointment 2. Clindamycin 3. Follow-up in 1 week Electronic Signature(s) Signed: 01/16/2023 9:22:15 AM By: HKalman ShanDO Entered By: HKalman Shanon 01/16/2023 08:59:15 [name], [name]([id number] Page 7 of 8 -------------------------------------------------------------------------------- ROS/PFSH Details Patient Name: Date of Service: [name], [name] 01/16/2023 7:45 A M Medical Record Number: [id number] Account Number: [id number] of Birth/Sex: Treating RN: [birth year](82y.o. FMarlowe ShoresPrimary Care Provider: DElsie StainOther Clinician: Referring Provider: Treating Provider/Extender: HKalman ShanSA [name], [name] Weeks in Treatment: 0 Information Obtained From Patient Endocrine Complaints and Symptoms: Negative for: Hepatitis; Thyroid disease; Polydypsia (Excessive Thirst) Medical History: Negative for: Type I Diabetes; Type II Diabetes Eyes Medical History: Positive for: Cataracts Past Medical History Notes: macular degeneration Musculoskeletal Medical History: Positive for: Osteoarthritis Oncologic Complaints and Symptoms: Review of System Notes: 2001 Lumpectomy Medical History: Positive for: Received Radiation Past Medical History Notes: Lumpectomy-about 20 years ago HBO Extended History Items Eyes: Cataracts Immunizations Pneumococcal Vaccine: Received Pneumococcal Vaccination: Yes Received Pneumococcal Vaccination On or After 60th Birthday:  Yes Implantable Devices None Family and Social History Never smoker; Marital Status - Married; Alcohol Use: Never; Drug Use: No History; Caffeine Use: Daily Electronic Signature(s) Signed: 01/16/2023 9:22:15 AM By: HKalman ShanDO Signed: 01/17/2023 11:31:26 AM By: WGretta Cool BSN, RN, CWS, Kim RN, BSN Entered By: WGretta Cool BSN, RN, CWS, Kim on 01/16/2023 08:01:38 CShamecka, DeschlerELillette [name]([id number] Page 8 of 8 -------------------------------------------------------------------------------- SuperBill Details Patient Name: Date of Service: [name], EDespina Hidden2/21/2024 Medical Record Number: [id number] Account Number: [id number] of Birth/Sex: Treating RN: [birth year](82y.o. FOrvan FalconerPrimary Care Provider: DElsie StainOther Clinician: Referring Provider: Treating Provider/Extender: HKalman ShanSA [name], [name] Weeks in Treatment: 0 Diagnosis Coding ICD-10 Codes Code Description L03.115 Cellulitis of right lower limb L97.811 Non-pressure chronic ulcer of other part of right lower leg limited to breakdown of skin Facility Procedures : CPT4 Code: [id number]: 99213 - WOUND CARE VISIT-LEV 3 EST PT Modifier: Quantity: 1  Physician Procedures : CPT4 Code Description Modifier [id number] - WC PHYS LEVEL 4 - EST PT ICD-10 Diagnosis Description L97.811 Non-pressure chronic ulcer of other part of right lower leg limited to breakdown of skin L03.115 Cellulitis of right lower limb Quantity: 1 Electronic Signature(s) Signed: 01/16/2023 9:22:15 AM By: [name] Shan DO Entered By: [name] on 01/16/2023 08:59:28

## 2023-01-22 NOTE — Progress Notes (Signed)
[name], [name] ([id number]) [id number] Page 1 of 5 Visit Report for 01/16/2023 Abuse Risk Screen Details Patient Name: Date of Service: [name], [name] 01/16/2023 7:45 A M Medical Record Number: [id number] Patient Account Number: [id number] Date of Birth/Sex: Treating RN: 03/17/1941 (82 y.o. Marlowe Shores Primary Care Sherree Shankman: [name] Other Clinician: Referring Godfrey Tritschler: Treating Dyan Creelman/Extender: Kalman Shan SA [name], [name] Weeks in Treatment: 0 Abuse Risk Screen Items Answer ABUSE RISK SCREEN: Has anyone close to you tried to hurt or harm you recentlyo No Do you feel uncomfortable with anyone in your familyo No Has anyone forced you do things that you didnt want to doo No Electronic Signature(s) Signed: 01/17/2023 11:31:26 AM By: Gretta Cool, BSN, RN, CWS, Kim RN, BSN Entered By: Gretta Cool, BSN, RN, CWS, Kim on 01/16/2023 08:01:43 -------------------------------------------------------------------------------- Activities of Daily Living Details Patient Name: Date of Service: [name], [name] 01/16/2023 7:45 A M Medical Record Number: [id number] Patient Account Number: [id number] Date of Birth/Sex: Treating RN: Oct 17, 1941 (82 y.o. Marlowe Shores Primary Care Avner Stroder: [name] Other Clinician: Referring Foster Frericks: Treating Tahjay Binion/Extender: Kalman Shan SA [place], [name] Weeks in Treatment: 0 Activities of Daily Living Items Answer Activities of Daily Living (Please select one for each item) Drive Automobile Completely Able T Medications ake Completely Able Use T elephone Completely Able Care for Appearance Completely Able Use T oilet Completely Able Bath / Shower Completely Able Dress Self Completely Able Feed Self Completely Able Walk Completely Able Get In / Out Bed Completely Able Housework Completely Able [name], [name] ([id number]) [id number] [id number] Page 2 of 5 Prepare  Meals Completely Able Handle Money Completely Able Shop for Self Completely Able Electronic Signature(s) Signed: 01/17/2023 11:31:26 AM By: Gretta Cool, BSN, RN, CWS, Kim RN, BSN Entered By: Gretta Cool, BSN, RN, CWS, Kim on 01/16/2023 08:01:54 -------------------------------------------------------------------------------- Education Screening Details Patient Name: Date of Service: [name], [name] 01/16/2023 7:45 A M Medical Record Number: [id number] Patient Account Number: [id number] Date of Birth/Sex: Treating RN: 01-15-1941 (82 y.o. Marlowe Shores Primary Care Joylyn Duggin: [name] Other Clinician: Referring Oliver Heitzenrater: Treating Lateya Dauria/Extender: Kalman Shan SA [name], [name] Weeks in Treatment: 0 Primary Learner Assessed: Patient Learning Preferences/Education Level/Primary Language Learning Preference: Explanation Preferred Language: English Cognitive Barrier Language Barrier: No Translator Needed: No Memory Deficit: No Emotional Barrier: No Physical Barrier Impaired Vision: Yes Glasses Knowledge/Comprehension Knowledge Level: Medium Comprehension Level: High Ability to understand written instructions: High Ability to understand verbal instructions: High Motivation Anxiety Level: Calm Cooperation: Cooperative Education Importance: Acknowledges Need Interest in Health Problems: Asks Questions Perception: Coherent Willingness to Engage in Self-Management High Activities: Readiness to Engage in Self-Management High Activities: Engineer, maintenance) Signed: 01/17/2023 11:31:26 AM By: Gretta Cool, BSN, RN, CWS, Kim RN, BSN Entered By: Gretta Cool, BSN, RN, CWS, Kim on 01/16/2023 08:02:22 [name] ([id number]) [id number] Page 3 of 5 -------------------------------------------------------------------------------- Fall Risk Assessment Details Patient Name: Date of Service: [name], [name] Hidden. 01/16/2023 7:45 A M Medical Record Number:  [id number] Patient Account Number: [id number] Date of Birth/Sex: Treating RN: October 16, 1941 (82 y.o. [name] Forward, Kim Primary Care Harrison Zetina: [name] Other Clinician: Referring Trew Sunde: Treating Tashika Goodin/Extender: Kalman Shan SA [name], [name] Weeks in Treatment: 0 Fall Risk Assessment Items Have you had 2 or more falls in the last 12 monthso 0 No Have you had any fall that resulted in injury in the last 12 monthso 0 No FALLS RISK SCREEN History of falling - immediate or within 3 months 0 No Secondary diagnosis (Do you have  2 or more medical diagnoseso) 0 No Ambulatory aid None/bed rest/wheelchair/nurse 0 No Crutches/cane/walker 15 Yes Furniture 0 No Intravenous therapy Access/Saline/Heparin Lock 0 No Gait/Transferring Normal/ bed rest/ wheelchair 0 No Weak (short steps with or without shuffle, stooped but able to lift head while walking, may seek 10 Yes support from furniture) Impaired (short steps with shuffle, may have difficulty arising from chair, head down, impaired 0 No balance) Mental Status Oriented to own ability 0 Yes Electronic Signature(s) Signed: 01/17/2023 11:31:26 AM By: Gretta Cool, BSN, RN, CWS, Kim RN, BSN Entered By: Gretta Cool, BSN, RN, CWS, Kim on 01/16/2023 08:02:40 -------------------------------------------------------------------------------- Foot Assessment Details Patient Name: Date of Service: [name], [name] 01/16/2023 7:45 A M Medical Record Number: [id number] Patient Account Number: [id number] Date of Birth/Sex: Treating RN: Feb 27, 1941 (82 y.o. [name] Primary Care Jandy Brackens: [name] Other Clinician: Referring Jodey Burbano: Treating Sincerity Cedar/Extender: Kalman Shan SA [name], [name] Weeks in Treatment: 0 Foot Assessment Items Site Locations [place], [place] ([id number]) [phone] [id number].pdf Page 4 of 5 + = Sensation present, - = Sensation absent, C = Callus, U = Ulcer R = Redness, W = Warmth, M =  Maceration, PU = Pre-ulcerative lesion F = Fissure, S = Swelling, D = Dryness Assessment Right: Left: Other Deformity: No No Prior Foot Ulcer: No No Prior Amputation: No No Charcot Joint: No No Ambulatory Status: Ambulatory Without Help Gait: Steady Electronic Signature(s) Signed: 01/21/2023 4:28:51 PM By: Carlene Coria RN Entered By: Carlene Coria on 01/16/2023 08:08:50 -------------------------------------------------------------------------------- Nutrition Risk Screening Details Patient Name: Date of Service: [name], [name] 01/16/2023 7:45 A M Medical Record Number: [id number] Patient Account Number: [id number] Date of Birth/Sex: Treating RN: [birth year] (82 y.o. [name] Forward, Maudie Mercury Primary Care Kort Stettler: [name] Other Clinician: Referring Amonie Wisser: Treating Huber Mathers/Extender: Kalman Shan SA [name], [name] Weeks in Treatment: 0 Height (in): 64 Weight (lbs): 202 Body Mass Index (BMI): 34.7 Nutrition Risk Screening Items Score Screening NUTRITION RISK SCREEN: I have an illness or condition that made me change the kind and/or amount of food I eat 0 No I eat fewer than two meals per day 0 No I eat few fruits and vegetables, or milk products 0 No I have three or more drinks of beer, liquor or wine almost every day 0 No I have tooth or mouth problems that make it hard for me to eat 0 No I don't always have enough money to buy the food I need 0 No [place], Girtha L ([id number]) [id number] [id number].pdf Page 5 of 5 I eat alone most of the time 0 No I take three or more different prescribed or over-the-counter drugs a day 1 Yes Without wanting to, I have lost or gained 10 pounds in the last six months 0 No I am not always physically able to shop, cook and/or feed myself 0 No Nutrition Protocols Good Risk Protocol 0 No interventions needed Moderate Risk Protocol High Risk Proctocol Risk Level: Good Risk Score: 1 Electronic Signature(s) Signed:  01/17/2023 11:31:26 AM By: Gretta Cool, BSN, RN, CWS, Kim RN, BSN Entered By: Gretta Cool, BSN, RN, CWS, Kim on 01/16/2023 08:03:03

## 2023-01-22 NOTE — Progress Notes (Signed)
DRYSTAL, NERSESIAN ([id number]) [phone].pdf Page 1 of 7 Visit Report for 01/16/2023 Allergy List Details Patient Name: Date of Service: [name], [name]. 01/16/2023 7:45 A M Medical Record Number: [id number] Patient Account Number: [id number] Date of Birth/Sex: Treating [name]: Nov 03, 1941 (82 y.o. [name] Primary Care [name]: [name] Other Clinician: Referring [name]: Treating [name]/Extender: [name] [name] Weeks in Treatment: 0 Allergies Active Allergies sulfur Macrodantin Allergy Notes Electronic Signature(s) Signed: 01/17/2023 11:31:26 AM By: [name], BSN, [name], CWS, [name], BSN Entered By: [name], BSN, [name], CWS, [name] on 01/16/2023 08:00:27 -------------------------------------------------------------------------------- Arrival Information Details Patient Name: Date of Service: [name], [name]. 01/16/2023 7:45 A M Medical Record Number: [id number] Patient Account Number: [id number] Date of Birth/Sex: Treating [name]: [birth year] (82 y.o. [name] Primary Care [name]: [name] Other Clinician: Referring Shearon Clonch: Treating [name]/Extender: [name] [name] Weeks in Treatment: 0 Visit Information Patient Arrived: [name] Arrival Time: 07:53 Accompanied By: self Transfer Assistance: None Patient Identification Verified: Yes Secondary Verification Process Completed: Yes Patient Has Alerts: Yes Patient Alerts: No ABI DT Infection History Since Last Visit Added or deleted any medications: No Electronic Signature(s) Signed: 01/17/2023 11:31:26 AM By: [name], BSN, [name], CWS, [name], BSN [name] ([id number]) [phone].pdf Page 2 of 7 Entered By: [name], BSN, [name], CWS, [name] on 01/16/2023 08:00:16 -------------------------------------------------------------------------------- Clinic Level of Care Assessment Details Patient Name: Date of Service: [name], [name]. 01/16/2023 7:45 A M Medical Record Number: [id number] Patient Account Number: [id number] Date of Birth/Sex: Treating [name]: 1941-02-10 (82 y.o. [name] Primary Care Teola Felipe: [name] Other Clinician: Referring Delaina Fetsch: Treating Damari Hiltz/Extender: [name] [name] Weeks in Treatment: 0 Clinic Level of Care Assessment Items TOOL 2 Quantity Score X- 1 0 Use when only an EandM is performed on the INITIAL visit ASSESSMENTS - Nursing Assessment / Reassessment X- 1 20 General Physical Exam (combine w/ comprehensive assessment (listed just below) when performed on new pt. evals) X- 1 25 Comprehensive Assessment (HX, ROS, Risk Assessments, Wounds Hx, etc.) ASSESSMENTS - Wound and Skin A ssessment / Reassessment '[]'$  - 0 Simple Wound Assessment / Reassessment - one wound '[]'$  - 0 Complex Wound Assessment / Reassessment - multiple wounds '[]'$  - 0 Dermatologic / Skin Assessment (not related to wound area) ASSESSMENTS - Ostomy and/or Continence Assessment and Care '[]'$  - 0 Incontinence Assessment and Management '[]'$  - 0 Ostomy Care Assessment and Management (repouching, etc.) PROCESS - Coordination of Care X - Simple Patient / Family Education for ongoing care 1 15 '[]'$  - 0 Complex (extensive) Patient / Family Education for ongoing care '[]'$  - 0 Staff obtains Programmer, systems, Records, T Results / Process Orders est '[]'$  - 0 Staff telephones HHA, Nursing Homes / Clarify orders / etc '[]'$  - 0 Routine Transfer to another Facility (non-emergent condition) '[]'$  - 0 Routine Hospital Admission (non-emergent condition) X- 1 15 New Admissions / Biomedical engineer / Ordering NPWT Apligraf, etc. , '[]'$  - 0 Emergency Hospital Admission (emergent condition) X- 1 10 Simple Discharge Coordination '[]'$  - 0 Complex (extensive) Discharge Coordination PROCESS - Special Needs '[]'$  - 0 Pediatric / Minor Patient Management '[]'$  - 0 Isolation Patient Management '[]'$  - 0 Hearing /  Language / Visual special needs '[]'$  - 0 Assessment of Community assistance (transportation, D/C planning, etc.) '[]'$  - 0 Additional assistance / Altered mentation '[]'$  - 0 Support Surface(s) Assessment (bed, cushion, seat, etc.) [name] ([id number]) [phone].pdf Page 3 of 7 INTERVENTIONS -  Wound Cleansing / Measurement '[]'$  - 0 Wound Imaging (photographs - any number of wounds) '[]'$  - 0 Wound Tracing (instead of photographs) '[]'$  - 0 Simple Wound Measurement - one wound '[]'$  - 0 Complex Wound Measurement - multiple wounds '[]'$  - 0 Simple Wound Cleansing - one wound '[]'$  - 0 Complex Wound Cleansing - multiple wounds INTERVENTIONS - Wound Dressings '[]'$  - 0 Small Wound Dressing one or multiple wounds '[]'$  - 0 Medium Wound Dressing one or multiple wounds '[]'$  - 0 Large Wound Dressing one or multiple wounds '[]'$  - 0 Application of Medications - injection INTERVENTIONS - Miscellaneous '[]'$  - 0 External ear exam '[]'$  - 0 Specimen Collection (cultures, biopsies, blood, body fluids, etc.) '[]'$  - 0 Specimen(s) / Culture(s) sent or taken to Lab for analysis '[]'$  - 0 Patient Transfer (multiple staff / Civil Service fast streamer / Similar devices) '[]'$  - 0 Simple Staple / Suture removal (25 or less) '[]'$  - 0 Complex Staple / Suture removal (26 or more) '[]'$  - 0 Hypo / Hyperglycemic Management (close monitor of Blood Glucose) '[]'$  - 0 Ankle / Brachial Index (ABI) - do not check if billed separately Has the patient been seen at the hospital within the last three years: Yes Total Score: 85 Level Of Care: New/Established - Level 3 Electronic Signature(s) Signed: 01/21/2023 4:28:51 PM By: [name] Coria [name] Entered By: [name] on 01/16/2023 08:42:03 -------------------------------------------------------------------------------- Encounter Discharge Information Details Patient Name: Date of Service: [name], [name]. 01/16/2023 7:45 A M Medical Record Number: [id number] Patient Account Number:  [id number] Date of Birth/Sex: Treating [name]: 12-26-1940 (82 y.o. [name] Primary Care Aamori Mcmasters: [name] Other Clinician: Referring Adreyan Carbajal: Treating Brown Dunlap/Extender: [name] [name] Weeks in Treatment: 0 Encounter Discharge Information Items Discharge Condition: Stable Ambulatory Status: Walker Discharge Destination: Home Transportation: Private Auto Accompanied By: self Schedule Follow-up Appointment: Yes Clinical Summary of Care: ANONDA, BLANCHET ([id number]) [id number] Page 4 of 7 Electronic Signature(s) Signed: 01/21/2023 4:28:51 PM By: [name] Coria [name] Entered By: [name] on 01/16/2023 08:42:45 -------------------------------------------------------------------------------- Lower Extremity Assessment Details Patient Name: Date of Service: [name], [name]. 01/16/2023 7:45 A M Medical Record Number: [id number] Patient Account Number: [id number] Date of Birth/Sex: Treating [name]: 02-06-1941 (82 y.o. [name] Primary Care Corbyn Wildey: [name] Other Clinician: Referring Niza Soderholm: Treating Preciosa Bundrick/Extender: [name] [name] Weeks in Treatment: 0 Edema Assessment Assessed: [Left: No] [Right: Yes] Edema: [Left: Ye] [Right: s] Calf Left: Right: Point of Measurement: 32 cm From Medial Instep 49.5 cm Ankle Left: Right: Point of Measurement: 12 cm From Medial Instep 29.7 cm Vascular Assessment Pulses: Dorsalis Pedis Palpable: [Right:Yes] Notes Patient's leg is red, swollen and warm to touch. Electronic Signature(s) Signed: 01/17/2023 11:31:26 AM By: [name], BSN, [name], CWS, [name], BSN Entered By: [name], BSN, [name], CWS, [name] on 01/16/2023 07:59:54 -------------------------------------------------------------------------------- Multi Wound Chart Details Patient Name: Date of Service: [name], [name]. 01/16/2023 7:45 A M Medical Record Number: [id number] Patient Account Number:  [id number] Date of Birth/Sex: Treating [name]: [birth year] (82 y.o. [name] Primary Care Urijah Arko: [name] Other Clinician: Referring Sheryll Dymek: Treating Jozy Mcphearson/Extender: [name] [name] [name], [place], [place] [name] ([id number]) [id number].pdf Page 5 of 7 Weeks in Treatment: 0 Vital Signs Height(in): 64 Pulse(bpm): 75 Weight(lbs): 202 Blood Pressure(mmHg): 172/89 Body Mass Index(BMI): 34.7 Temperature(F): 97.5 Respiratory Rate(breaths/min): 16 [Treatment Notes:Wound Assessments Treatment Notes] Electronic Signature(s) Signed: 01/16/2023 9:22:15 AM By: [name] Shan DO Entered By: [name] on 01/16/2023 08:49:02 -------------------------------------------------------------------------------- Multi-Disciplinary Care Plan  Details Patient Name: Date of Service: [name], [name]. 01/16/2023 7:45 A M Medical Record Number: [id number] Patient Account Number: [id number] Date of Birth/Sex: Treating [name]: 1941/07/10 (82 y.o. [name] Primary Care Zubayr Bednarczyk: [name] Other Clinician: Referring Verna Desrocher: Treating Cashe Gatt/Extender: [name] [name] Weeks in Treatment: 0 Active Inactive Wound/Skin Impairment Nursing Diagnoses: Knowledge deficit related to ulceration/compromised skin integrity Goals: Patient/caregiver will verbalize understanding of skin care regimen Date Initiated: 01/16/2023 Target Resolution Date: 02/14/2023 Goal Status: Active Ulcer/skin breakdown will have a volume reduction of 30% by week 4 Date Initiated: 01/16/2023 Target Resolution Date: 02/14/2023 Goal Status: Active Ulcer/skin breakdown will have a volume reduction of 50% by week 8 Date Initiated: 01/16/2023 Target Resolution Date: 03/17/2023 Goal Status: Active Ulcer/skin breakdown will have a volume reduction of 80% by week 12 Date Initiated: 01/16/2023 Target Resolution Date: 04/16/2023 Goal Status: Active Ulcer/skin  breakdown will heal within 14 weeks Date Initiated: 01/16/2023 Target Resolution Date: 05/17/2023 Goal Status: Active Interventions: Assess patient/caregiver ability to obtain necessary supplies Assess patient/caregiver ability to perform ulcer/skin care regimen upon admission and as needed Assess ulceration(s) every visit Notes: Electronic Signature(s) ANAMARIS, SARANTOS [name] ([id number]) [phone].pdf Page 6 of 7 Signed: 01/21/2023 4:28:51 PM By: [name] Coria [name] Entered By: [name] on 01/16/2023 08:12:00 -------------------------------------------------------------------------------- Pain Assessment Details Patient Name: Date of Service: [name], [name]. 01/16/2023 7:45 A M Medical Record Number: [id number] Patient Account Number: [id number] Date of Birth/Sex: Treating [name]: 02/07/1941 (82 y.o. [name] Primary Care Brax Walen: [name] Other Clinician: Referring Carmisha Larusso: Treating Octavis Sheeler/Extender: [name] [name] Weeks in Treatment: 0 Active Problems Location of Pain Severity and Description of Pain Patient Has Paino Yes Site Locations Pain Location: Pain in Ulcers Rate the pain. Current Pain Level: 3 Character of Pain Describe the Pain: Aching, Burning Pain Management and Medication Current Pain Management: Electronic Signature(s) Signed: 01/17/2023 11:31:26 AM By: [name], BSN, [name], CWS, [name], BSN Entered By: [name], BSN, [name], CWS, [name] on 01/16/2023 07:55:16 Cullifer, [name] ([id number]) [phone].pdf Page 7 of 7 -------------------------------------------------------------------------------- Patient/Caregiver Education Details Patient Name: Date of Service: [name], [name] 2/21/2024andnbsp7:45 A M Medical Record Number: [id number] Patient Account Number: [id number] Date of Birth/Gender: Treating [name]: [birth year] (82 y.o. [name] Primary Care Physician: [name] Other  Clinician: Referring Physician: Treating Physician/Extender: [name] [name] Weeks in Treatment: 0 Education Assessment Education Provided To: Patient Education Topics Provided Welcome T [facility] Packet: o Methods: Explain/Verbal Responses: State content correctly Electronic Signature(s) Signed: 01/21/2023 4:28:51 PM By: [name] Coria [name] Entered By: [name] on 01/16/2023 08:12:12 -------------------------------------------------------------------------------- [place] Details Patient Name: Date of Service: [name], [name]. 01/16/2023 7:45 A M Medical Record Number: [id number] Patient Account Number: [id number] Date of Birth/Sex: Treating [name]: [birth year] (82 y.o. Charolette Forward, [name] Primary Care Marques Ericson: [name] Other Clinician: Referring Briyonna Omara: Treating Lakeena Downie/Extender: [name] [name] Weeks in Treatment: 0 Vital Signs Time Taken: 07:55 Temperature (F): 97.5 Height (in): 64 Pulse (bpm): 75 Weight (lbs): 202 Respiratory Rate (breaths/min): 16 Body Mass Index (BMI): 34.7 Blood Pressure (mmHg): 172/89 Reference Range: 80 - 120 mg / dl Electronic Signature(s) Signed: 01/17/2023 11:31:26 AM By: [name], BSN, [name], CWS, [name], BSN Entered By: [name], BSN, [name], CWS, [name] on 01/16/2023 07:55:57

## 2023-01-23 ENCOUNTER — Encounter: Payer: PPO | Admitting: Internal Medicine

## 2023-01-23 DIAGNOSIS — L97811 Non-pressure chronic ulcer of other part of right lower leg limited to breakdown of skin: Secondary | ICD-10-CM | POA: Diagnosis not present

## 2023-01-24 NOTE — Progress Notes (Signed)
[name] ([id number]) [id number] Page 1 of 8 Visit Report for 01/23/2023 Arrival Information Details Patient Name: Date of Service: [name], [name] 01/23/2023 1:15 PM Medical Record Number: [id number] Patient Account Number: [id number] Date of Birth/Sex: Treating RN: 24-Apr-1941 (82 y.o. [name] Primary Care [name]: [name] Other Clinician: Referring [name]: Treating [name]/Extender: [name] in Treatment: 1 Visit Information History Since Last Visit Added or deleted any medications: No Patient Arrived: Walker Has Dressing in Place as Prescribed: Yes Arrival Time: 13:31 Has Compression in Place as Prescribed: Yes Accompanied By: self Pain Present Now: No Transfer Assistance: None Patient Identification Verified: Yes Secondary Verification Process Completed: Yes Patient Requires Transmission-Based Precautions: No Patient Has Alerts: Yes Patient Alerts: No ABI DT Infection Electronic Signature(s) Signed: 01/23/2023 5:10:28 PM By: [name] Loud MSN RN CNS WTA Entered By: [name] on 01/23/2023 13:56:49 -------------------------------------------------------------------------------- Clinic Level of Care Assessment Details Patient Name: Date of Service: [name], [name]. 01/23/2023 1:15 PM Medical Record Number: [id number] Patient Account Number: [id number] Date of Birth/Sex: Treating RN: Mar 29, 1941 (82 y.o. [name] Primary Care [name]: [name] Other Clinician: Referring [name]: Treating [name]/Extender: [name] in Treatment: 1 Clinic Level of Care Assessment Items TOOL 4 Quantity Score X- 1 0 Use when only an EandM is performed on FOLLOW-UP visit ASSESSMENTS - Nursing Assessment / Reassessment X- 1 10 Reassessment of Co-morbidities (includes updates in patient status) X- 1 5 Reassessment of Adherence to Treatment Plan ASSESSMENTS  - Wound and Skin A ssessment / Reassessment '[]'$  - 0 Simple Wound Assessment / Reassessment - one wound [name], [name] ([id number]) [phone].pdf Page 2 of 8 '[]'$  - 0 Complex Wound Assessment / Reassessment - multiple wounds '[]'$  - 0 Dermatologic / Skin Assessment (not related to wound area) ASSESSMENTS - Focused Assessment '[]'$  - 0 Circumferential Edema Measurements - multi extremities '[]'$  - 0 Nutritional Assessment / Counseling / Intervention '[]'$  - 0 Lower Extremity Assessment (monofilament, tuning fork, pulses) '[]'$  - 0 Peripheral Arterial Disease Assessment (using hand held doppler) ASSESSMENTS - Ostomy and/or Continence Assessment and Care '[]'$  - 0 Incontinence Assessment and Management '[]'$  - 0 Ostomy Care Assessment and Management (repouching, etc.) PROCESS - Coordination of Care X - Simple Patient / Family Education for ongoing care 1 15 '[]'$  - 0 Complex (extensive) Patient / Family Education for ongoing care X- 1 10 Staff obtains Programmer, systems, Records, T Results / Process Orders est '[]'$  - 0 Staff telephones HHA, Nursing Homes / Clarify orders / etc '[]'$  - 0 Routine Transfer to another Facility (non-emergent condition) '[]'$  - 0 Routine Hospital Admission (non-emergent condition) '[]'$  - 0 New Admissions / Biomedical engineer / Ordering NPWT Apligraf, etc. , '[]'$  - 0 Emergency Hospital Admission (emergent condition) X- 1 10 Simple Discharge Coordination '[]'$  - 0 Complex (extensive) Discharge Coordination PROCESS - Special Needs '[]'$  - 0 Pediatric / Minor Patient Management '[]'$  - 0 Isolation Patient Management '[]'$  - 0 Hearing / Language / Visual special needs '[]'$  - 0 Assessment of Community assistance (transportation, D/C planning, etc.) '[]'$  - 0 Additional assistance / Altered mentation '[]'$  - 0 Support Surface(s) Assessment (bed, cushion, seat, etc.) INTERVENTIONS - Wound Cleansing / Measurement X - Simple Wound Cleansing - one wound 1 5 '[]'$  - 0 Complex Wound  Cleansing - multiple wounds X- 1 5 Wound Imaging (photographs - any number of wounds) '[]'$  - 0 Wound Tracing (instead of photographs) '[]'$  - 0 Simple Wound Measurement - one wound '[]'$  -  0 Complex Wound Measurement - multiple wounds INTERVENTIONS - Wound Dressings '[]'$  - 0 Small Wound Dressing one or multiple wounds '[]'$  - 0 Medium Wound Dressing one or multiple wounds '[]'$  - 0 Large Wound Dressing one or multiple wounds X- 1 5 Application of Medications - topical '[]'$  - 0 Application of Medications - injection INTERVENTIONS - Miscellaneous '[]'$  - 0 External ear exam '[]'$  - 0 Specimen Collection (cultures, biopsies, blood, body fluids, etc.) '[]'$  - 0 Specimen(s) / Culture(s) sent or taken to Lab for analysis [name], [name] ([id number]) [phone].pdf Page 3 of 8 '[]'$  - 0 Patient Transfer (multiple staff / Civil Service fast streamer / Similar devices) '[]'$  - 0 Simple Staple / Suture removal (25 or less) '[]'$  - 0 Complex Staple / Suture removal (26 or more) '[]'$  - 0 Hypo / Hyperglycemic Management (close monitor of Blood Glucose) '[]'$  - 0 Ankle / Brachial Index (ABI) - do not check if billed separately X- 1 5 Vital Signs Has the patient been seen at the hospital within the last three years: Yes Total Score: 70 Level Of Care: New/Established - Level 2 Electronic Signature(s) Signed: 01/23/2023 5:10:28 PM By: [name] Loud MSN RN CNS WTA Entered By: [name] on 01/23/2023 14:09:31 -------------------------------------------------------------------------------- Encounter Discharge Information Details Patient Name: Date of Service: [name], [name]. 01/23/2023 1:15 PM Medical Record Number: [id number] Patient Account Number: [id number] Date of Birth/Sex: Treating RN: November 12, 1941 (82 y.o. [name] Primary Care [name]: [name] Other Clinician: Referring [name]: Treating [name]/Extender: [name] in Treatment: 1 Encounter Discharge  Information Items Discharge Condition: Stable Ambulatory Status: Walker Discharge Destination: Home Transportation: Private Auto Accompanied By: self Schedule Follow-up Appointment: Yes Clinical Summary of Care: Electronic Signature(s) Signed: 01/23/2023 5:10:28 PM By: [name] Loud MSN RN CNS WTA Entered By: [name] on 01/23/2023 14:26:28 -------------------------------------------------------------------------------- Lower Extremity Assessment Details Patient Name: Date of Service: [name], [name]. 01/23/2023 1:15 PM Medical Record Number: [id number] Patient Account Number: [id number] Date of Birth/Sex: Treating RN: 1940/12/17 (81 y.o. [name] Primary Care Tejal Monroy: [name] Other Clinician: CLEONA, [name] ([id number]) [id number] Page 4 of 8 Referring Ebony Rickel: Treating Naquisha Whitehair/Extender: [name] in Treatment: 1 Edema Assessment Assessed: [Left: No] [Right: Yes] [Left: Edema] [Right: :] Calf Left: Right: Point of Measurement: From Medial Instep 50 cm Ankle Left: Right: Point of Measurement: From Medial Instep 30 cm Vascular Assessment Pulses: Dorsalis Pedis Palpable: [Right:Yes] Electronic Signature(s) Signed: 01/23/2023 5:10:28 PM By: [name] Loud MSN RN CNS WTA Entered By: [name] on 01/23/2023 13:56:58 -------------------------------------------------------------------------------- Multi Wound Chart Details Patient Name: Date of Service: [name], [name]. 01/23/2023 1:15 PM Medical Record Number: [id number] Patient Account Number: [id number] Date of Birth/Sex: Treating RN: 1940-12-13 (82 y.o. [name] Primary Care Eugene Zeiders: [name] Other Clinician: Referring Adely Facer: Treating Tabari Volkert/Extender: [name] in Treatment: 1 Vital Signs Height(in): 64 Pulse(bpm): 72 Weight(lbs): 202 Blood Pressure(mmHg): 125/74 Body Mass Index(BMI):  34.7 Temperature(F): 97.4 Respiratory Rate(breaths/min): 16 [Treatment Notes:Wound Assessments Treatment Notes] Electronic Signature(s) Signed: 01/23/2023 5:10:28 PM By: [name] Loud MSN RN CNS WTA Entered By: [name] on 01/23/2023 13:57:04 [place], [name] ([id number].pdf Page 5 of 8 -------------------------------------------------------------------------------- Multi-Disciplinary Care Plan Details Patient Name: Date of Service: [name], [name]. 01/23/2023 1:15 PM Medical Record Number: [id number] Patient Account Number: [id number] Date of Birth/Sex: Treating RN: 07/20/1941 (82 y.o. [name] Primary Care Caya Soberanis: [name] Other Clinician: Referring Geraldine Sandberg: Treating Kym Scannell/Extender: Kalman Shan  [name] Weeks in Treatment: 1 Active Inactive Wound/Skin Impairment Nursing Diagnoses: Knowledge deficit related to ulceration/compromised skin integrity Goals: Patient/caregiver will verbalize understanding of skin care regimen Date Initiated: 01/16/2023 Target Resolution Date: 02/14/2023 Goal Status: Active Ulcer/skin breakdown will have a volume reduction of 30% by week 4 Date Initiated: 01/16/2023 Target Resolution Date: 02/14/2023 Goal Status: Active Ulcer/skin breakdown will have a volume reduction of 50% by week 8 Date Initiated: 01/16/2023 Target Resolution Date: 03/17/2023 Goal Status: Active Ulcer/skin breakdown will have a volume reduction of 80% by week 12 Date Initiated: 01/16/2023 Target Resolution Date: 04/16/2023 Goal Status: Active Ulcer/skin breakdown will heal within 14 weeks Date Initiated: 01/16/2023 Target Resolution Date: 05/17/2023 Goal Status: Active Interventions: Assess patient/caregiver ability to obtain necessary supplies Assess patient/caregiver ability to perform ulcer/skin care regimen upon admission and as needed Assess ulceration(s) every visit Notes: Electronic  Signature(s) Signed: 01/23/2023 5:10:28 PM By: [name] Loud MSN RN CNS WTA Entered By: [name] on 01/23/2023 14:21:37 -------------------------------------------------------------------------------- Non-Wound Condition Assessment Details Patient Name: Date of Service: [name], [name]. 01/23/2023 1:15 PM Medical Record Number: [id number] Patient Account Number: [id number] [name], [name] ([id number]) [phone].pdf Page 6 of 8 Date of Birth/Sex: Treating RN: [birth year] (82 y.o. [name] Primary Care Bobby Ragan: [name] Other Clinician: Referring Laterrian Hevener: Treating Mikita Lesmeister/Extender: Randalyn Rhea Weeks in Treatment: 1 Non-Wound Condition: Condition: Cellulitis Location: Leg Side: Right Photos Electronic Signature(s) Signed: 01/23/2023 5:10:28 PM By: [name] Loud MSN RN CNS WTA Entered By: [name] on 01/23/2023 13:56:07 -------------------------------------------------------------------------------- Pain Assessment Details Patient Name: Date of Service: [name], [name]. 01/23/2023 1:15 PM Medical Record Number: [id number] Patient Account Number: [id number] Date of Birth/Sex: Treating RN: 1940-11-30 (81 y.o. [name] Primary Care Jamesmichael Shadd: [name] Other Clinician: Referring Shaneca Orne: Treating Borden Thune/Extender: [name] in Treatment: 1 Active Problems Location of Pain Severity and Description of Pain Patient Has Paino No Site Locations [place], [place] [name] ([id number]) [phone].pdf Page 7 of 8 Pain Management and Medication Current Pain Management: Electronic Signature(s) Signed: 01/23/2023 5:10:28 PM By: [name] Loud MSN RN CNS WTA Entered By: [name] on 01/23/2023 13:44:55 -------------------------------------------------------------------------------- Patient/Caregiver Education Details Patient Name: Date of Service: [name], [name]  Cephus Slater 2/28/2024andnbsp1:15 PM Medical Record Number: [id number] Patient Account Number: [id number] Date of Birth/Gender: Treating RN: May 22, 1941 (82 y.o. [name] Primary Care Physician: [name] Other Clinician: Referring Physician: Treating Physician/Extender: [name] in Treatment: 1 Education Assessment Education Provided To: Patient Education Topics Provided Wound/Skin Impairment: Handouts: Caring for Your Ulcer Methods: Explain/Verbal Responses: State content correctly Electronic Signature(s) Signed: 01/23/2023 5:10:28 PM By: [name] Loud MSN RN CNS WTA Entered By: [name] on 01/23/2023 14:21:51 -------------------------------------------------------------------------------- [place] Details Patient Name: Date of Service: [name], [name]. 01/23/2023 1:15 PM Medical Record Number: [id number] Patient Account Number: [id number] Date of Birth/Sex: Treating RN: 1941/08/15 (81 y.o. [name] Primary Care Levy Cedano: [name] Other Clinician: Referring Chue Berkovich: Treating Vickye Astorino/Extender: Randalyn Rhea Weeks in Treatment: 1 Vital Signs [place], [name] ([id number]) [id number] Page 8 of 8 Time Taken: 13:40 Temperature (F): 97.4 Height (in): 64 Pulse (bpm): 72 Weight (lbs): 202 Respiratory Rate (breaths/min): 16 Body Mass Index (BMI): 34.7 Blood Pressure (mmHg): 125/74 Reference Range: 80 - 120 mg / dl Electronic Signature(s) Signed: 01/23/2023 5:10:28 PM By: [name] Loud MSN RN CNS WTA Entered By: [name] on 01/23/2023 13:44:25

## 2023-01-24 NOTE — Progress Notes (Signed)
LADONYA, [name] ([id number]) [id number] Page 1 of 7 Visit Report for 01/23/2023 Chief Complaint Document Details Patient Name: Date of Service: [name], [name]. 01/23/2023 1:15 PM Medical Record Number: [id number] Patient Account Number: [id number] Date of Birth/Sex: Treating RN: 1941-06-07 (82 y.o. [name] Primary Care Provider: [name] Other Clinician: Referring Provider: Treating Provider/Extender: Hewitt Blade in Treatment: 1 Information Obtained from: Patient Chief Complaint 01/16/2023; increased warmth and erythema to the right lower extremity with scattered areas of skin breakdown Electronic Signature(s) Signed: 01/23/2023 3:56:42 PM By: Kalman Shan DO Entered By: Kalman Shan on 01/23/2023 14:05:27 -------------------------------------------------------------------------------- HPI Details Patient Name: Date of Service: [name], [name]. 01/23/2023 1:15 PM Medical Record Number: [id number] Patient Account Number: [id number] Date of Birth/Sex: Treating RN: 04/05/1941 (82 y.o. [name] Primary Care Provider: [name] Other Clinician: Referring Provider: Treating Provider/Extender: Hewitt Blade in Treatment: 1 History of Present Illness HPI Description: 08/31/2021 upon evaluation today patient presents for initial inspection here in our clinic concerning issues that she is having with a wound on her left anterior lower extremity. This is actually an area that she unfortunately tells me she was picking up a cage and dropped hitting the anterior portion of her shin causing a skin tear. She did not go to the hospital due to the fact that she did not realize that she would need to. This happened on July 30, 2021. With that being said the patient has been on Augmentin she has about 1 and half days of this left. Nonetheless she tells me that she does have a  longstanding history of leg swelling. She does have chronic venous insufficiency as well it appears to me. She also has some evidence of may be early stages of lymphedema. She has a history of fibromyalgia, bilateral knee osteoarthritis, and appears to have a fungal infection of the right ankle region. In regard to the arthritis she tells me her legs tend to swell less after she gets her knee injections allowing her to be able to walk and get around much more effectively. Following the injections she does well for a while and then when that seems to be wearing off she tends to start having issues with more swelling because she is more mobile that makes sense from a venous standpoint. 10/10; I was asked to see this patient today who came in for a nurse visit. Firstly she has developed a widespread very pruritic rash involving her abdomen and back. Lesser extent on her feet. The second problem is that she is complaining about the tightness of the wound and wrap on the left leg where the wound is. 09/14/2021 upon evaluation today patient appears to be doing decently well in regard to her leg ulcer. The biggest issue she is having is a significant issue with a rash currently. I do not see any signs of active infection systemically which is great news although with regard to the rash she did see dermatology they told her she had eczema that does not seem to sit right with me out think that is mainly the issues she has going on. I do think she may benefit from the use of a ALISIA, [name] ([id number]) [id number] Page 2 of 7 short course of prednisone which should help with the rash which is absolutely driving her crazy. 09/21/2021 upon evaluation today patient appears to be doing decently well in regard to her wound this is still pretty deep.  The prednisone has helped with the rash although is not completely done and it is doing much better than what it was. Fortunately there  is no signs of active infection systemically at this time also great news. 09/28/2021 upon evaluation today patient's wound on her leg actually showing some signs of improvement which is good news I am happy in that regard. Fortunately there does not appear to be any evidence of active infection systemically which is great news. Unfortunately she is still continuing to have issues with her rash which is almost worse than the actual wound itself for her at this point. 10/05/2021 upon evaluation today patient appears to be doing well with regard to her leg ulcer. She is tolerating the dressing changes without complication and overall I am extremely pleased with where we stand. With that being said I do think that she could possibly benefit from a snap VAC if we can gain approval I am not certain if this will be improved or not. Nonetheless I definitely think we can give this a try and if we get approval I think this will help speed up the healing process to be honest. In the interim I do believe the [facility] is doing decently well. 10/10/2021 upon evaluation today patient appears to be doing well with regard to her wound although is not significantly smaller. We still have quite a bit of depth here. Fortunately there is no signs of active infection systemically at this time. No fevers, chills, nausea, vomiting, or diarrhea. 10/17/2021 upon evaluation today patient appears to be doing poorly in regard to her leg compared to last time there is a lot of irritation today. She actually kept the Zetuvit on all week which does not seem to have been beneficial for her. She was supposed to change it at home we did order her supplies but she did not even find the supplies on her porch until she tells me yesterday. Either way we had already decided that the best thing was probably can to be to have her come in for nurse visits. I think that still probably the best thing to do I feel like there is a bit of  dementia here that I was not aware of last week or even the previous visit to be honest that is going to prohibit her from being able to make these dressing changes herself. 10/27/2021 upon evaluation today patient appears to be doing well with regard to her wound currently. She is actually showing signs of excellent improvement and I do feel like the collagen is doing a great job. I am extremely pleased with where we stand today. 11/03/2021 upon evaluation patient's wound bed actually showed signs of good granulation epithelization at this point. I am actually extremely pleased with where we stand and how this is progressing I think the patient is making great progress. She is very happy with today's findings. 12/27. This is a patient with wounds on her left lateral lower extremity. She has 2 open wounds. She did not tolerate 3 layer compression we have therefore been using Tubigrip. We use dermacol last week but we have endoform to use this week 12/04/2021 upon evaluation today patient appears to be doing well with regard to her wound. There is still some irritation around where she has a small area that she bumped inferior to where the original wound was. This is not appearing to be too significant at this point which is good news. 12/07/2020 upon evaluation today patient appears  to be doing well with regard to her wounds actually look much better even than earlier this week when I saw her. They are measuring smaller. Unfortunately she does appear to have some cellulitis of the leg in general. There is really nothing significant that I can culture here so I am just can have to treat prophylactically and see if we can get this under control. The patient voiced understanding and does realize what is going on as well as we discussed that today. 1/25; patient presents for follow-up. She reports finishing her course of antibiotics. She reports improvement in her symptoms. She is only able to tolerate  the compression wrap for a few days before she takes this off herself. It is unclear if she is dressing the wound when she takes the compression wraps off. She is scheduled for her venous reflux studies tomorrow. She currently denies signs of infection. 12/29/2021 upon evaluation today patient actually appears to be completely healed which is great news. She has been using her compression socks which is good. Fortunately I do not see any signs of active infection locally nor systemically at this time. 01/16/2023 Ms. [name] is an 82 year old female with a past medical history of essential hypertension, venous insufficiency and fibromyalgia that presents to the clinic for a 1-105-monthhistory of increased warmth and erythema to the right lower extremity with areas of skin breakdown. Looking through the EMR she has seen her primary care office for this issue and has been treated with Keflex, Unna boot and she is also been started on a diuretic. When discussing this with her she seemed unaware of any of the treatments she has had except for the Unna boot for which she states she had an allergic reaction. She currently reports pain to the areas of skin breakdown to the right lower extremity. She has increased warmth and erythema to this area. She has compression stockings but is not wearing them. She currently denies fever/chills, nausea/vomiting. 2/28; patient presents for follow-up. She has been using mupirocin to the open scattered areas however this caused burning pain. She has been taking her clindamycin without issues. She has been using her Tubigrip. Electronic Signature(s) Signed: 01/23/2023 3:56:42 PM By: HKalman ShanDO Entered By: HKalman Shanon 01/23/2023 14:06:44 -------------------------------------------------------------------------------- Physical Exam Details Patient Name: Date of Service: [name], [name]. 01/23/2023 1:15 PM Medical Record Number: [id number]  Account Number: [id number] of Birth/Sex: Treating RN: 1Aug 21, 1942((82y.o. FDrema PryPrimary Care Provider: DElsie StainOther Clinician: Referring Provider: Treating Provider/Extender: HHewitt Bladein Treatment: 1 CSella, BuysEJuneau([id number] Page 3 of 7 Constitutional . Cardiovascular . Psychiatric . Notes Right lower extremity: 2+ pitting edema to the knee. Slight increase in erythema and warmth to the distal/anterior leg. Improvement from last clinic visit. Very few scattered areas limited to skin breakdown. Electronic Signature(s) Signed: 01/23/2023 3:56:42 PM By: HKalman ShanDO Entered By: HKalman Shanon 01/23/2023 14:07:51 -------------------------------------------------------------------------------- Physician Orders Details Patient Name: Date of Service: [name], [name]. 01/23/2023 1:15 PM Medical Record Number: [id number] Account Number: [id number] of Birth/Sex: Treating RN: 1January 20, 1942((82y.o. FDrema PryPrimary Care Provider: DElsie StainOther Clinician: Referring Provider: Treating Provider/Extender: HHewitt Bladein Treatment: 1 Verbal / Phone Orders: No Diagnosis Coding Follow-up Appointments Return Appointment in 1 week. Bathing/ Shower/ Hygiene May shower; gently cleanse wound with antibacterial soap, rinse and pat dry prior to dressing wounds Edema Control -  Lymphedema / Segmental Compressive Device / Other Tubigrip single layer applied. - triamsinolone, tubi grip size E apply daily Elevate, Exercise Daily and A void Standing for Long Periods of Time. Elevate legs to the level of the heart and pump ankles as often as possible Elevate leg(s) parallel to the floor when sitting. Non-Wound Condition Right Lower Extremity Cleanse affected area with antibacterial soap and water, - May try to wash with Hibiclens Try Zyrtec or  Benedryl Patient Medications llergies: sulfur, Macrodantin A Notifications Medication Indication Start End 01/23/2023 triamcinolone acetonide DOSE 1 - topical 0.1 % cream - 1 cream topical once daily Electronic Signature(s) Signed: 01/23/2023 3:56:42 PM By: Kalman Shan DO Previous Signature: 01/23/2023 2:10:37 PM Version By: Kalman Shan DO Entered By: Kalman Shan on 01/23/2023 14:10:48 Novinger, [name] ([id number]) [id number] Page 4 of 7 -------------------------------------------------------------------------------- Problem List Details Patient Name: Date of Service: [name], [name]. 01/23/2023 1:15 PM Medical Record Number: [id number] Patient Account Number: [id number] Date of Birth/Sex: Treating RN: 11-Apr-1941 (82 y.o. [name] Primary Care Provider: [name] Other Clinician: Referring Provider: Treating Provider/Extender: Hewitt Blade in Treatment: 1 Active Problems ICD-10 Encounter Code Description Active Date MDM Diagnosis L03.115 Cellulitis of right lower limb 01/16/2023 No Yes L97.811 Non-pressure chronic ulcer of other part of right lower leg limited to breakdown 01/16/2023 No Yes of skin Inactive Problems Resolved Problems Electronic Signature(s) Signed: 01/23/2023 3:56:42 PM By: Kalman Shan DO Signed: 01/23/2023 5:10:28 PM By: Rosalio Loud MSN RN CNS WTA Entered By: Rosalio Loud on 01/23/2023 14:22:23 -------------------------------------------------------------------------------- Progress Note Details Patient Name: Date of Service: [name], [name]. 01/23/2023 1:15 PM Medical Record Number: [id number] Patient Account Number: [id number] Date of Birth/Sex: Treating RN: 11-Feb-1941 (82 y.o. [name] Primary Care Provider: [name] Other Clinician: Referring Provider: Treating Provider/Extender: Hewitt Blade in Treatment:  1 Subjective Chief Complaint Information obtained from Patient 01/16/2023; increased warmth and erythema to the right lower extremity with scattered areas of skin breakdown FABIOLA, CORTI [name] ([id number]) [id number] Page 5 of 7 History of Present Illness (HPI) 08/31/2021 upon evaluation today patient presents for initial inspection here in our clinic concerning issues that she is having with a wound on her left anterior lower extremity. This is actually an area that she unfortunately tells me she was picking up a cage and dropped hitting the anterior portion of her shin causing a skin tear. She did not go to the hospital due to the fact that she did not realize that she would need to. This happened on July 30, 2021. With that being said the patient has been on Augmentin she has about 1 and half days of this left. Nonetheless she tells me that she does have a longstanding history of leg swelling. She does have chronic venous insufficiency as well it appears to me. She also has some evidence of may be early stages of lymphedema. She has a history of fibromyalgia, bilateral knee osteoarthritis, and appears to have a fungal infection of the right ankle region. In regard to the arthritis she tells me her legs tend to swell less after she gets her knee injections allowing her to be able to walk and get around much more effectively. Following the injections she does well for a while and then when that seems to be wearing off she tends to start having issues with more swelling because she is more mobile that makes sense from a venous standpoint. 10/10; I was asked to  see this patient today who came in for a nurse visit. Firstly she has developed a widespread very pruritic rash involving her abdomen and back. Lesser extent on her feet. The second problem is that she is complaining about the tightness of the wound and wrap on the left leg where the wound is. 09/14/2021 upon  evaluation today patient appears to be doing decently well in regard to her leg ulcer. The biggest issue she is having is a significant issue with a rash currently. I do not see any signs of active infection systemically which is great news although with regard to the rash she did see dermatology they told her she had eczema that does not seem to sit right with me out think that is mainly the issues she has going on. I do think she may benefit from the use of a short course of prednisone which should help with the rash which is absolutely driving her crazy. 09/21/2021 upon evaluation today patient appears to be doing decently well in regard to her wound this is still pretty deep. The prednisone has helped with the rash although is not completely done and it is doing much better than what it was. Fortunately there is no signs of active infection systemically at this time also great news. 09/28/2021 upon evaluation today patient's wound on her leg actually showing some signs of improvement which is good news I am happy in that regard. Fortunately there does not appear to be any evidence of active infection systemically which is great news. Unfortunately she is still continuing to have issues with her rash which is almost worse than the actual wound itself for her at this point. 10/05/2021 upon evaluation today patient appears to be doing well with regard to her leg ulcer. She is tolerating the dressing changes without complication and overall I am extremely pleased with where we stand. With that being said I do think that she could possibly benefit from a snap VAC if we can gain approval I am not certain if this will be improved or not. Nonetheless I definitely think we can give this a try and if we get approval I think this will help speed up the healing process to be honest. In the interim I do believe the [facility] is doing decently well. 10/10/2021 upon evaluation today patient appears to be  doing well with regard to her wound although is not significantly smaller. We still have quite a bit of depth here. Fortunately there is no signs of active infection systemically at this time. No fevers, chills, nausea, vomiting, or diarrhea. 10/17/2021 upon evaluation today patient appears to be doing poorly in regard to her leg compared to last time there is a lot of irritation today. She actually kept the Zetuvit on all week which does not seem to have been beneficial for her. She was supposed to change it at home we did order her supplies but she did not even find the supplies on her porch until she tells me yesterday. Either way we had already decided that the best thing was probably can to be to have her come in for nurse visits. I think that still probably the best thing to do I feel like there is a bit of dementia here that I was not aware of last week or even the previous visit to be honest that is going to prohibit her from being able to make these dressing changes herself. 10/27/2021 upon evaluation today patient appears to be doing  well with regard to her wound currently. She is actually showing signs of excellent improvement and I do feel like the collagen is doing a great job. I am extremely pleased with where we stand today. 11/03/2021 upon evaluation patient's wound bed actually showed signs of good granulation epithelization at this point. I am actually extremely pleased with where we stand and how this is progressing I think the patient is making great progress. She is very happy with today's findings. 12/27. This is a patient with wounds on her left lateral lower extremity. She has 2 open wounds. She did not tolerate 3 layer compression we have therefore been using Tubigrip. We use dermacol last week but we have endoform to use this week 12/04/2021 upon evaluation today patient appears to be doing well with regard to her wound. There is still some irritation around where she has a small  area that she bumped inferior to where the original wound was. This is not appearing to be too significant at this point which is good news. 12/07/2020 upon evaluation today patient appears to be doing well with regard to her wounds actually look much better even than earlier this week when I saw her. They are measuring smaller. Unfortunately she does appear to have some cellulitis of the leg in general. There is really nothing significant that I can culture here so I am just can have to treat prophylactically and see if we can get this under control. The patient voiced understanding and does realize what is going on as well as we discussed that today. 1/25; patient presents for follow-up. She reports finishing her course of antibiotics. She reports improvement in her symptoms. She is only able to tolerate the compression wrap for a few days before she takes this off herself. It is unclear if she is dressing the wound when she takes the compression wraps off. She is scheduled for her venous reflux studies tomorrow. She currently denies signs of infection. 12/29/2021 upon evaluation today patient actually appears to be completely healed which is great news. She has been using her compression socks which is good. Fortunately I do not see any signs of active infection locally nor systemically at this time. 01/16/2023 Ms. Rasheka Loveless is an 82 year old female with a past medical history of essential hypertension, venous insufficiency and fibromyalgia that presents to the clinic for a 1-43-monthhistory of increased warmth and erythema to the right lower extremity with areas of skin breakdown. Looking through the EMR she has seen her primary care office for this issue and has been treated with Keflex, Unna boot and she is also been started on a diuretic. When discussing this with her she seemed unaware of any of the treatments she has had except for the Unna boot for which she states she had an allergic  reaction. She currently reports pain to the areas of skin breakdown to the right lower extremity. She has increased warmth and erythema to this area. She has compression stockings but is not wearing them. She currently denies fever/chills, nausea/vomiting. 2/28; patient presents for follow-up. She has been using mupirocin to the open scattered areas however this caused burning pain. She has been taking her clindamycin without issues. She has been using her Tubigrip. Objective Constitutional Vitals Time Taken: 1:40 PM, Height: 64 in, Weight: 202 lbs, BMI: 34.7, Temperature: 97.4 F, Pulse: 72 bpm, Respiratory Rate: 16 breaths/min, Blood Pressure: 125/74 mmHg. CANNALIS, SIEKIERSKI([id number] Page 6 of 7 General Notes: Right lower extremity: 2+  pitting edema to the knee. Slight increase in erythema and warmth to the distal/anterior leg. Improvement from last clinic visit. Very few scattered areas limited to skin breakdown. Assessment Active Problems ICD-10 Cellulitis of right lower limb Non-pressure chronic ulcer of other part of right lower leg limited to breakdown of skin Patient's symptoms have improved with clindamycin. I recommended she finish her oral antibiotics. For now she can use steroid cream as she is having some itching and irritation to the leg. Continue Tubigrip. She has a few small areas still open. If her symptoms do not fully resolve she would benefit from an in office wrap As her edema is not fully controlled. Plan 1. TCA 2. Tubigrip daily 3. Follow-up in 1 week Electronic Signature(s) Signed: 01/23/2023 3:56:42 PM By: Kalman Shan DO Entered By: Kalman Shan on 01/23/2023 14:09:26 -------------------------------------------------------------------------------- ROS/PFSH Details Patient Name: Date of Service: [name], [name]. 01/23/2023 1:15 PM Medical Record Number: [id number] Patient Account Number: [id number] Date of  Birth/Sex: Treating RN: [birth year] (82 y.o. [name] Primary Care Provider: [name] Other Clinician: Referring Provider: Treating Provider/Extender: Hewitt Blade in Treatment: 1 Information Obtained From Patient Eyes Medical History: Positive for: Cataracts Past Medical History Notes: macular degeneration Endocrine Medical History: Negative for: Type I Diabetes; Type II Diabetes Musculoskeletal Medical History: Positive for: Osteoarthritis KARCYN, LOSANO ([id number]) [id number] Page 7 of 7 Oncologic Medical History: Positive for: Received Radiation Past Medical History Notes: Lumpectomy-about 20 years ago HBO Extended History Items Eyes: Cataracts Immunizations Pneumococcal Vaccine: Received Pneumococcal Vaccination: Yes Received Pneumococcal Vaccination On or After 60th Birthday: Yes Implantable Devices None Family and Social History Never smoker; Marital Status - Married; Alcohol Use: Never; Drug Use: No History; Caffeine Use: Daily Electronic Signature(s) Signed: 01/23/2023 3:56:42 PM By: Kalman Shan DO Signed: 01/23/2023 5:10:28 PM By: Rosalio Loud MSN RN CNS WTA Entered By: Kalman Shan on 01/23/2023 14:11:05 -------------------------------------------------------------------------------- SuperBill Details Patient Name: Date of Service: [name], [name]. 01/23/2023 Medical Record Number: [id number] Patient Account Number: [id number] Date of Birth/Sex: Treating RN: 02-May-1941 (82 y.o. [name] Primary Care Provider: [name] Other Clinician: Referring Provider: Treating Provider/Extender: Hewitt Blade in Treatment: 1 Diagnosis Coding ICD-10 Codes Code Description [phone] Cellulitis of right lower limb L97.811 Non-pressure chronic ulcer of other part of right lower leg limited to breakdown of skin Facility Procedures : CPT4 Code:  [id number] Description: [id number] - WOUND CARE VISIT-LEV 2 EST PT Modifier: Quantity: 1 Electronic Signature(s) Signed: 01/23/2023 3:56:42 PM By: Kalman Shan DO Signed: 01/23/2023 5:10:28 PM By: Rosalio Loud MSN RN CNS WTA Entered By: Rosalio Loud on 01/23/2023 14:17:27

## 2023-01-30 ENCOUNTER — Encounter: Payer: PPO | Attending: Internal Medicine | Admitting: Internal Medicine

## 2023-01-30 DIAGNOSIS — L03115 Cellulitis of right lower limb: Secondary | ICD-10-CM

## 2023-01-30 DIAGNOSIS — L97811 Non-pressure chronic ulcer of other part of right lower leg limited to breakdown of skin: Secondary | ICD-10-CM | POA: Diagnosis not present

## 2023-02-01 NOTE — Progress Notes (Signed)
[name] ([id number]) [id number] Page 1 of 7 Visit Report for 01/30/2023 Arrival Information Details Patient Name: Date of Service: [name], [name] 01/30/2023 3:15 PM Medical Record Number: [id number] Patient Account Number: [id number] Date of Birth/Sex: Treating RN: 03-Aug-1941 (82 y.o. [name] Primary Care Shatona Andujar: [name] Other Clinician: Referring Ethelda Deangelo: Treating Epiphany Seltzer/Extender: Hewitt Blade in Treatment: 2 Visit Information History Since Last Visit Added or deleted any medications: No Patient Arrived: Walker Has Dressing in Place as Prescribed: Yes Arrival Time: 15:44 Pain Present Now: No Accompanied By: self Transfer Assistance: None Patient Identification Verified: Yes Secondary Verification Process Completed: Yes Patient Requires Transmission-Based Precautions: No Patient Has Alerts: Yes Patient Alerts: No ABI DT Infection Electronic Signature(s) Signed: 01/30/2023 4:47:39 PM By: Rosalio Loud MSN RN CNS WTA Entered By: Rosalio Loud on 01/30/2023 15:59:20 -------------------------------------------------------------------------------- Clinic Level of Care Assessment Details Patient Name: Date of Service: [name], [name]. 01/30/2023 3:15 PM Medical Record Number: [id number] Patient Account Number: [id number] Date of Birth/Sex: Treating RN: 03-30-1941 (82 y.o. [name] Primary Care Alverta Caccamo: [name] Other Clinician: Referring Mairany Bruno: Treating Kishon Garriga/Extender: Hewitt Blade in Treatment: 2 Clinic Level of Care Assessment Items TOOL 4 Quantity Score X- 1 0 Use when only an EandM is performed on FOLLOW-UP visit ASSESSMENTS - Nursing Assessment / Reassessment X- 1 10 Reassessment of Co-morbidities (includes updates in patient status) X- 1 5 Reassessment of Adherence to Treatment Plan ASSESSMENTS - Wound and Skin A ssessment / Reassessment '[]'$   - 0 Simple Wound Assessment / Reassessment - one wound [name] ([id number].pdf Page 2 of 7 '[]'$  - 0 Complex Wound Assessment / Reassessment - multiple wounds '[]'$  - 0 Dermatologic / Skin Assessment (not related to wound area) ASSESSMENTS - Focused Assessment X- 1 5 Circumferential Edema Measurements - multi extremities '[]'$  - 0 Nutritional Assessment / Counseling / Intervention '[]'$  - 0 Lower Extremity Assessment (monofilament, tuning fork, pulses) '[]'$  - 0 Peripheral Arterial Disease Assessment (using hand held doppler) ASSESSMENTS - Ostomy and/or Continence Assessment and Care '[]'$  - 0 Incontinence Assessment and Management '[]'$  - 0 Ostomy Care Assessment and Management (repouching, etc.) PROCESS - Coordination of Care X - Simple Patient / Family Education for ongoing care 1 15 '[]'$  - 0 Complex (extensive) Patient / Family Education for ongoing care '[]'$  - 0 Staff obtains Programmer, systems, Records, T Results / Process Orders est '[]'$  - 0 Staff telephones HHA, Nursing Homes / Clarify orders / etc '[]'$  - 0 Routine Transfer to another Facility (non-emergent condition) '[]'$  - 0 Routine Hospital Admission (non-emergent condition) '[]'$  - 0 New Admissions / Biomedical engineer / Ordering NPWT Apligraf, etc. , '[]'$  - 0 Emergency Hospital Admission (emergent condition) X- 1 10 Simple Discharge Coordination '[]'$  - 0 Complex (extensive) Discharge Coordination PROCESS - Special Needs '[]'$  - 0 Pediatric / Minor Patient Management '[]'$  - 0 Isolation Patient Management '[]'$  - 0 Hearing / Language / Visual special needs '[]'$  - 0 Assessment of Community assistance (transportation, D/C planning, etc.) '[]'$  - 0 Additional assistance / Altered mentation '[]'$  - 0 Support Surface(s) Assessment (bed, cushion, seat, etc.) INTERVENTIONS - Wound Cleansing / Measurement '[]'$  - 0 Simple Wound Cleansing - one wound '[]'$  - 0 Complex Wound Cleansing - multiple wounds '[]'$  - 0 Wound Imaging  (photographs - any number of wounds) '[]'$  - 0 Wound Tracing (instead of photographs) '[]'$  - 0 Simple Wound Measurement - one wound '[]'$  - 0 Complex Wound Measurement - multiple  wounds INTERVENTIONS - Wound Dressings '[]'$  - 0 Small Wound Dressing one or multiple wounds '[]'$  - 0 Medium Wound Dressing one or multiple wounds '[]'$  - 0 Large Wound Dressing one or multiple wounds '[]'$  - 0 Application of Medications - topical '[]'$  - 0 Application of Medications - injection INTERVENTIONS - Miscellaneous '[]'$  - 0 External ear exam '[]'$  - 0 Specimen Collection (cultures, biopsies, blood, body fluids, etc.) '[]'$  - 0 Specimen(s) / Culture(s) sent or taken to Lab for analysis [name], [name] ([id number]) [phone].pdf Page 3 of 7 '[]'$  - 0 Patient Transfer (multiple staff / Civil Service fast streamer / Similar devices) '[]'$  - 0 Simple Staple / Suture removal (25 or less) '[]'$  - 0 Complex Staple / Suture removal (26 or more) '[]'$  - 0 Hypo / Hyperglycemic Management (close monitor of Blood Glucose) '[]'$  - 0 Ankle / Brachial Index (ABI) - do not check if billed separately X- 1 5 Vital Signs Has the patient been seen at the hospital within the last three years: Yes Total Score: 50 Level Of Care: New/Established - Level 2 Electronic Signature(s) Signed: 01/30/2023 4:47:39 PM By: Rosalio Loud MSN RN CNS WTA Entered By: Rosalio Loud on 01/30/2023 16:15:08 -------------------------------------------------------------------------------- Encounter Discharge Information Details Patient Name: Date of Service: [name], [name]. 01/30/2023 3:15 PM Medical Record Number: [id number] Patient Account Number: [id number] Date of Birth/Sex: Treating RN: 06-Nov-1941 (82 y.o. [name] Primary Care Khayree Delellis: [name] Other Clinician: Referring Mete Purdum: Treating Nour Scalise/Extender: Hewitt Blade in Treatment: 2 Encounter Discharge Information Items Discharge Condition:  Stable Ambulatory Status: Walker Discharge Destination: Home Transportation: Private Auto Accompanied By: self Schedule Follow-up Appointment: No Clinical Summary of Care: Electronic Signature(s) Signed: 01/30/2023 4:47:39 PM By: Rosalio Loud MSN RN CNS WTA Entered By: Rosalio Loud on 01/30/2023 16:16:47 -------------------------------------------------------------------------------- Lower Extremity Assessment Details Patient Name: Date of Service: [name], [name]. 01/30/2023 3:15 PM Medical Record Number: [id number] Patient Account Number: [id number] Date of Birth/Sex: Treating RN: Dec 05, 1940 (81 y.o. [name] Primary Care Gaetana Kawahara: [name] Other Clinician: KAZIAH, [name] ([id number]) [id number] Page 4 of 7 Referring Tanaya Dunigan: Treating Jefrey Raburn/Extender: Hewitt Blade in Treatment: 2 Edema Assessment Assessed: [Left: No] [Right: No] [Left: Edema] [Right: :] Calf Left: Right: Point of Measurement: 33 cm From Medial Instep 48.2 cm Ankle Left: Right: Point of Measurement: 12 cm From Medial Instep 29 cm Knee To Floor Left: Right: From Medial Instep 42 cm Vascular Assessment Pulses: Dorsalis Pedis Palpable: [Right:Yes] Electronic Signature(s) Signed: 01/30/2023 4:47:39 PM By: Rosalio Loud MSN RN CNS WTA Entered By: Rosalio Loud on 01/30/2023 16:12:25 -------------------------------------------------------------------------------- Multi Wound Chart Details Patient Name: Date of Service: [name], [name]. 01/30/2023 3:15 PM Medical Record Number: [id number] Patient Account Number: [id number] Date of Birth/Sex: Treating RN: 1941-10-30 (82 y.o. [name] Primary Care Kynsie Falkner: [name] Other Clinician: Referring Yassmine Tamm: Treating Adrain Butrick/Extender: Hewitt Blade in Treatment: 2 Vital Signs Height(in): 64 Pulse(bpm): 70 Weight(lbs): 202 Blood Pressure(mmHg):  176/89 Body Mass Index(BMI): 34.7 Temperature(F): 97.4 Respiratory Rate(breaths/min): 16 [Treatment Notes:Wound Assessments Treatment Notes] Electronic Signature(s) Signed: 01/30/2023 4:47:39 PM By: Rosalio Loud MSN RN CNS WTA Entered By: Rosalio Loud on 01/30/2023 16:12:59 [name], [name] ([id number] Page 5 of 7 -------------------------------------------------------------------------------- Multi-Disciplinary Care Plan Details Patient Name: Date of Service: [name], [name] 01/30/2023 3:15 PM Medical Record Number: [id number] Patient Account Number: [id number] Date of Birth/Sex: Treating RN: 06-23-1941 (82 y.o. [name] Primary Care Adetokunbo Mccadden: [name]  Other Clinician: Referring Jaquise Faux: Treating Conley Pawling/Extender: Hewitt Blade in Treatment: 2 Active Inactive Electronic Signature(s) Signed: 01/30/2023 4:47:39 PM By: Rosalio Loud MSN RN CNS WTA Entered By: Rosalio Loud on 01/30/2023 16:15:39 -------------------------------------------------------------------------------- Non-Wound Condition Assessment Details Patient Name: Date of Service: [name], [name]. 01/30/2023 3:15 PM Medical Record Number: [id number] Patient Account Number: [id number] Date of Birth/Sex: Treating RN: 08-29-1941 (81 y.o. [name] Primary Care Deadrian Toya: [name] Other Clinician: Referring Filip Luten: Treating Leibish Mcgregor/Extender: Randalyn Rhea Weeks in Treatment: 2 Non-Wound Condition: Condition: Cellulitis Location: Leg Side: Right Photos Electronic Signature(s) Signed: 01/30/2023 4:47:39 PM By: Rosalio Loud MSN RN CNS [address] [place] ([id number]) [phone].pdf Page 6 of 7 Entered By: Rosalio Loud on 01/30/2023 15:59:16 -------------------------------------------------------------------------------- Pain Assessment Details Patient Name: Date of Service: [name],  [name] 01/30/2023 3:15 PM Medical Record Number: [id number] Patient Account Number: [id number] Date of Birth/Sex: Treating RN: 1941-06-13 (82 y.o. [name] Primary Care Kirk Sampley: [name] Other Clinician: Referring Lovelee Forner: Treating Kano Heckmann/Extender: Hewitt Blade in Treatment: 2 Active Problems Location of Pain Severity and Description of Pain Patient Has Paino No Site Locations Pain Management and Medication Current Pain Management: Electronic Signature(s) Signed: 01/30/2023 4:47:39 PM By: Rosalio Loud MSN RN CNS WTA Entered By: Rosalio Loud on 01/30/2023 15:59:28 -------------------------------------------------------------------------------- Patient/Caregiver Education Details Patient Name: Date of Service: [name], [name] 3/6/2024andnbsp3:15 PM Medical Record Number: [id number] Patient Account Number: [id number] Date of Birth/Gender: Treating RN: [birth year] (81 y.o. [name] Primary Care Physician: [name] Other Clinician: XITLALI, ROTHFUS ([id number]) [id number] Page 7 of 7 Referring Physician: Treating Physician/Extender: Hewitt Blade in Treatment: 2 Education Assessment Education Provided To: Patient Education Topics Provided Electronic Signature(s) Signed: 01/30/2023 4:47:39 PM By: Rosalio Loud MSN RN CNS WTA Entered By: Rosalio Loud on 01/30/2023 16:15:48 -------------------------------------------------------------------------------- Vitals Details Patient Name: Date of Service: [name], [name]. 01/30/2023 3:15 PM Medical Record Number: [id number] Patient Account Number: [id number] Date of Birth/Sex: Treating RN: Nov 11, 1941 (81 y.o. [name] Primary Care Tynisha Ogan: [name] Other Clinician: Referring Han Vejar: Treating Aleia Larocca/Extender: Hewitt Blade in Treatment: 2 Vital Signs Time Taken: 15:44 Temperature  (F): 97.4 Height (in): 64 Pulse (bpm): 70 Weight (lbs): 202 Respiratory Rate (breaths/min): 16 Body Mass Index (BMI): 34.7 Blood Pressure (mmHg): 176/89 Reference Range: 80 - 120 mg / dl Electronic Signature(s) Signed: 01/30/2023 4:47:39 PM By: Rosalio Loud MSN RN CNS WTA Entered By: Rosalio Loud on 01/30/2023 15:59:24

## 2023-02-01 NOTE — Progress Notes (Signed)
KELLSIE, GERL ([id number]) [id number] Page 1 of 8 Visit Report for 01/30/2023 Chief Complaint Document Details Patient Name: Date of Service: [name], [name] 01/30/2023 3:15 PM Medical Record Number: [id number] Patient Account Number: [id number] Date of Birth/Sex: Treating RN: [birth year] (82 y.o. [name] Primary Care Provider: [name] Other Clinician: Referring Provider: Treating Provider/Extender: Hewitt Blade in Treatment: 2 Information Obtained from: Patient Chief Complaint 01/16/2023; increased warmth and erythema to the right lower extremity with scattered areas of skin breakdown Electronic Signature(s) Signed: 01/30/2023 4:16:35 PM By: Kalman Shan DO Entered By: Kalman Shan on 01/30/2023 16:12:05 -------------------------------------------------------------------------------- HPI Details Patient Name: Date of Service: [name], [name] 01/30/2023 3:15 PM Medical Record Number: [id number] Patient Account Number: [id number] Date of Birth/Sex: Treating RN: 1941/09/01 (82 y.o. [name] Primary Care Provider: [name] Other Clinician: Referring Provider: Treating Provider/Extender: Hewitt Blade in Treatment: 2 History of Present Illness HPI Description: 08/31/2021 upon evaluation today patient presents for initial inspection here in our clinic concerning issues that she is having with a wound on her left anterior lower extremity. This is actually an area that she unfortunately tells me she was picking up a cage and dropped hitting the anterior portion of her shin causing a skin tear. She did not go to the hospital due to the fact that she did not realize that she would need to. This happened on July 30, 2021. With that being said the patient has been on Augmentin she has about 1 and half days of this left. Nonetheless she tells me that she does have a  longstanding history of leg swelling. She does have chronic venous insufficiency as well it appears to me. She also has some evidence of may be early stages of lymphedema. She has a history of fibromyalgia, bilateral knee osteoarthritis, and appears to have a fungal infection of the right ankle region. In regard to the arthritis she tells me her legs tend to swell less after she gets her knee injections allowing her to be able to walk and get around much more effectively. Following the injections she does well for a while and then when that seems to be wearing off she tends to start having issues with more swelling because she is more mobile that makes sense from a venous standpoint. 10/10; I was asked to see this patient today who came in for a nurse visit. Firstly she has developed a widespread very pruritic rash involving her abdomen and back. Lesser extent on her feet. The second problem is that she is complaining about the tightness of the wound and wrap on the left leg where the wound is. 09/14/2021 upon evaluation today patient appears to be doing decently well in regard to her leg ulcer. The biggest issue she is having is a significant issue with a rash currently. I do not see any signs of active infection systemically which is great news although with regard to the rash she did see dermatology they told her she had eczema that does not seem to sit right with me out think that is mainly the issues she has going on. I do think she may benefit from the use of a NICOLETT, VORHIES L ([id number]) [phone].pdf Page 2 of 8 short course of prednisone which should help with the rash which is absolutely driving her crazy. 09/21/2021 upon evaluation today patient appears to be doing decently well in regard to her wound this is still pretty deep.  The prednisone has helped with the rash although is not completely done and it is doing much better than what it was. Fortunately there  is no signs of active infection systemically at this time also great news. 09/28/2021 upon evaluation today patient's wound on her leg actually showing some signs of improvement which is good news I am happy in that regard. Fortunately there does not appear to be any evidence of active infection systemically which is great news. Unfortunately she is still continuing to have issues with her rash which is almost worse than the actual wound itself for her at this point. 10/05/2021 upon evaluation today patient appears to be doing well with regard to her leg ulcer. She is tolerating the dressing changes without complication and overall I am extremely pleased with where we stand. With that being said I do think that she could possibly benefit from a snap VAC if we can gain approval I am not certain if this will be improved or not. Nonetheless I definitely think we can give this a try and if we get approval I think this will help speed up the healing process to be honest. In the interim I do believe the [facility] is doing decently well. 10/10/2021 upon evaluation today patient appears to be doing well with regard to her wound although is not significantly smaller. We still have quite a bit of depth here. Fortunately there is no signs of active infection systemically at this time. No fevers, chills, nausea, vomiting, or diarrhea. 10/17/2021 upon evaluation today patient appears to be doing poorly in regard to her leg compared to last time there is a lot of irritation today. She actually kept the Zetuvit on all week which does not seem to have been beneficial for her. She was supposed to change it at home we did order her supplies but she did not even find the supplies on her porch until she tells me yesterday. Either way we had already decided that the best thing was probably can to be to have her come in for nurse visits. I think that still probably the best thing to do I feel like there is a bit of  dementia here that I was not aware of last week or even the previous visit to be honest that is going to prohibit her from being able to make these dressing changes herself. 10/27/2021 upon evaluation today patient appears to be doing well with regard to her wound currently. She is actually showing signs of excellent improvement and I do feel like the collagen is doing a great job. I am extremely pleased with where we stand today. 11/03/2021 upon evaluation patient's wound bed actually showed signs of good granulation epithelization at this point. I am actually extremely pleased with where we stand and how this is progressing I think the patient is making great progress. She is very happy with today's findings. 12/27. This is a patient with wounds on her left lateral lower extremity. She has 2 open wounds. She did not tolerate 3 layer compression we have therefore been using Tubigrip. We use dermacol last week but we have endoform to use this week 12/04/2021 upon evaluation today patient appears to be doing well with regard to her wound. There is still some irritation around where she has a small area that she bumped inferior to where the original wound was. This is not appearing to be too significant at this point which is good news. 12/07/2020 upon evaluation today patient appears  to be doing well with regard to her wounds actually look much better even than earlier this week when I saw her. They are measuring smaller. Unfortunately she does appear to have some cellulitis of the leg in general. There is really nothing significant that I can culture here so I am just can have to treat prophylactically and see if we can get this under control. The patient voiced understanding and does realize what is going on as well as we discussed that today. 1/25; patient presents for follow-up. She reports finishing her course of antibiotics. She reports improvement in her symptoms. She is only able to tolerate  the compression wrap for a few days before she takes this off herself. It is unclear if she is dressing the wound when she takes the compression wraps off. She is scheduled for her venous reflux studies tomorrow. She currently denies signs of infection. 12/29/2021 upon evaluation today patient actually appears to be completely healed which is great news. She has been using her compression socks which is good. Fortunately I do not see any signs of active infection locally nor systemically at this time. 01/16/2023 Ms. Wrenleigh Brauner is an 82 year old female with a past medical history of essential hypertension, venous insufficiency and fibromyalgia that presents to the clinic for a 1-47-monthhistory of increased warmth and erythema to the right lower extremity with areas of skin breakdown. Looking through the EMR she has seen her primary care office for this issue and has been treated with Keflex, Unna boot and she is also been started on a diuretic. When discussing this with her she seemed unaware of any of the treatments she has had except for the Unna boot for which she states she had an allergic reaction. She currently reports pain to the areas of skin breakdown to the right lower extremity. She has increased warmth and erythema to this area. She has compression stockings but is not wearing them. She currently denies fever/chills, nausea/vomiting. 2/28; patient presents for follow-up. She has been using mupirocin to the open scattered areas however this caused burning pain. She has been taking her clindamycin without issues. She has been using her Tubigrip. 3/6; patient presents for follow-up. She has been using triamcinolone cream to the right lower extremity along with Tubigrip daily. This has resolved her symptoms. The irritation and itching to her legs has resolved. She has no open wounds. Electronic Signature(s) Signed: 01/30/2023 4:16:35 PM By: HKalman ShanDO Entered By: HKalman Shan on 01/30/2023 16:12:53 -------------------------------------------------------------------------------- Physical Exam Details Patient Name: Date of Service: [name], [name] 01/30/2023 3:15 PM Medical Record Number: [id number] Account Number: [id number] of Birth/Sex: Treating RN: [birth year]((82y.o. FDrema PryPrimary Care Provider: DElsie StainOther Clinician: Referring Provider: Treating Provider/Extender: HZanyiah, Crus([id number] Page 3 of 8 Weeks in Treatment: 2 Constitutional . Cardiovascular . Psychiatric . Notes Right lower extremity: 2+ pitting edema to the knee. No increased warmth or erythema to the lower extremity. No open wounds. Patient in good spirits. Electronic Signature(s) Signed: 01/30/2023 4:16:35 PM By: HKalman ShanDO Entered By: HKalman Shanon 01/30/2023 16:13:24 -------------------------------------------------------------------------------- Physician Orders Details Patient Name: Date of Service: [name], [name] 01/30/2023 3:15 PM Medical Record Number: [id number] Account Number: [id number] of Birth/Sex: Treating RN: 113-May-1942((82y.o. FDrema PryPrimary Care Provider: DElsie StainOther Clinician: Referring Provider: Treating Provider/Extender: HHewitt Bladein Treatment: 2 Verbal / Phone  Orders: No Diagnosis Coding Follow-up Appointments Return Appointment in 1 week. Bathing/ Shower/ Hygiene May shower; gently cleanse wound with antibacterial soap, rinse and pat dry prior to dressing wounds Edema Control - Lymphedema / Segmental Compressive Device / Other Tubigrip single layer applied. - triamsinolone, tubi grip size E apply daily Elevate, Exercise Daily and A void Standing for Long Periods of Time. Elevate legs to the level of the heart and pump ankles as often as possible Elevate leg(s)  parallel to the floor when sitting. Non-Wound Condition Right Lower Extremity Cleanse affected area with antibacterial soap and water, - May try to wash with Hibiclens Try Zyrtec or Benedryl Patient Medications llergies: sulfur, Macrodantin A Notifications Medication Indication Start End 01/30/2023 triamcinolone acetonide DOSE 1 - topical 0.1 % cream - 1 cream topical once daily as needed Electronic Signature(s) Signed: 01/30/2023 4:16:35 PM By: Kalman Shan DO Previous Signature: 01/30/2023 4:16:01 PM Version By: Kalman Shan DO Previous Signature: 01/30/2023 4:11:27 PM Version By: Kalman Shan DO Entered By: Kalman Shan on 01/30/2023 16:16:10 Mutschler, [name] ([id number] Page 4 of 8 -------------------------------------------------------------------------------- Problem List Details Patient Name: Date of Service: [name], [name] 01/30/2023 3:15 PM Medical Record Number: [id number] Patient Account Number: [id number] Date of Birth/Sex: Treating RN: 1941/02/07 (82 y.o. [name] Primary Care Provider: [name] Other Clinician: Referring Provider: Treating Provider/Extender: Hewitt Blade in Treatment: 2 Active Problems ICD-10 Encounter Code Description Active Date MDM Diagnosis L03.115 Cellulitis of right lower limb 01/16/2023 No Yes L97.811 Non-pressure chronic ulcer of other part of right lower leg limited to breakdown 01/16/2023 No Yes of skin Inactive Problems Resolved Problems Electronic Signature(s) Signed: 01/30/2023 4:16:35 PM By: Kalman Shan DO Signed: 01/30/2023 4:47:39 PM By: Rosalio Loud MSN RN CNS WTA Entered By: Rosalio Loud on 01/30/2023 16:16:02 -------------------------------------------------------------------------------- Progress Note Details Patient Name: Date of Service: [name], [name] 01/30/2023 3:15 PM Medical Record Number: [id number] Patient Account  Number: [id number] Date of Birth/Sex: Treating RN: November 28, 1940 (82 y.o. [name] Primary Care Provider: [name] Other Clinician: Referring Provider: Treating Provider/Extender: Hewitt Blade in Treatment: 2 Subjective Chief Complaint Information obtained from Patient ISHITHA, HANNASCH ([id number]) [id number] Page 5 of 8 01/16/2023; increased warmth and erythema to the right lower extremity with scattered areas of skin breakdown History of Present Illness (HPI) 08/31/2021 upon evaluation today patient presents for initial inspection here in our clinic concerning issues that she is having with a wound on her left anterior lower extremity. This is actually an area that she unfortunately tells me she was picking up a cage and dropped hitting the anterior portion of her shin causing a skin tear. She did not go to the hospital due to the fact that she did not realize that she would need to. This happened on July 30, 2021. With that being said the patient has been on Augmentin she has about 1 and half days of this left. Nonetheless she tells me that she does have a longstanding history of leg swelling. She does have chronic venous insufficiency as well it appears to me. She also has some evidence of may be early stages of lymphedema. She has a history of fibromyalgia, bilateral knee osteoarthritis, and appears to have a fungal infection of the right ankle region. In regard to the arthritis she tells me her legs tend to swell less after she gets her knee injections allowing her to be able to walk and get around much more  effectively. Following the injections she does well for a while and then when that seems to be wearing off she tends to start having issues with more swelling because she is more mobile that makes sense from a venous standpoint. 10/10; I was asked to see this patient today who came in for a nurse visit. Firstly she has  developed a widespread very pruritic rash involving her abdomen and back. Lesser extent on her feet. The second problem is that she is complaining about the tightness of the wound and wrap on the left leg where the wound is. 09/14/2021 upon evaluation today patient appears to be doing decently well in regard to her leg ulcer. The biggest issue she is having is a significant issue with a rash currently. I do not see any signs of active infection systemically which is great news although with regard to the rash she did see dermatology they told her she had eczema that does not seem to sit right with me out think that is mainly the issues she has going on. I do think she may benefit from the use of a short course of prednisone which should help with the rash which is absolutely driving her crazy. 09/21/2021 upon evaluation today patient appears to be doing decently well in regard to her wound this is still pretty deep. The prednisone has helped with the rash although is not completely done and it is doing much better than what it was. Fortunately there is no signs of active infection systemically at this time also great news. 09/28/2021 upon evaluation today patient's wound on her leg actually showing some signs of improvement which is good news I am happy in that regard. Fortunately there does not appear to be any evidence of active infection systemically which is great news. Unfortunately she is still continuing to have issues with her rash which is almost worse than the actual wound itself for her at this point. 10/05/2021 upon evaluation today patient appears to be doing well with regard to her leg ulcer. She is tolerating the dressing changes without complication and overall I am extremely pleased with where we stand. With that being said I do think that she could possibly benefit from a snap VAC if we can gain approval I am not certain if this will be improved or not. Nonetheless I definitely think  we can give this a try and if we get approval I think this will help speed up the healing process to be honest. In the interim I do believe the [facility] is doing decently well. 10/10/2021 upon evaluation today patient appears to be doing well with regard to her wound although is not significantly smaller. We still have quite a bit of depth here. Fortunately there is no signs of active infection systemically at this time. No fevers, chills, nausea, vomiting, or diarrhea. 10/17/2021 upon evaluation today patient appears to be doing poorly in regard to her leg compared to last time there is a lot of irritation today. She actually kept the Zetuvit on all week which does not seem to have been beneficial for her. She was supposed to change it at home we did order her supplies but she did not even find the supplies on her porch until she tells me yesterday. Either way we had already decided that the best thing was probably can to be to have her come in for nurse visits. I think that still probably the best thing to do I feel like there is  a bit of dementia here that I was not aware of last week or even the previous visit to be honest that is going to prohibit her from being able to make these dressing changes herself. 10/27/2021 upon evaluation today patient appears to be doing well with regard to her wound currently. She is actually showing signs of excellent improvement and I do feel like the collagen is doing a great job. I am extremely pleased with where we stand today. 11/03/2021 upon evaluation patient's wound bed actually showed signs of good granulation epithelization at this point. I am actually extremely pleased with where we stand and how this is progressing I think the patient is making great progress. She is very happy with today's findings. 12/27. This is a patient with wounds on her left lateral lower extremity. She has 2 open wounds. She did not tolerate 3 layer compression we have  therefore been using Tubigrip. We use dermacol last week but we have endoform to use this week 12/04/2021 upon evaluation today patient appears to be doing well with regard to her wound. There is still some irritation around where she has a small area that she bumped inferior to where the original wound was. This is not appearing to be too significant at this point which is good news. 12/07/2020 upon evaluation today patient appears to be doing well with regard to her wounds actually look much better even than earlier this week when I saw her. They are measuring smaller. Unfortunately she does appear to have some cellulitis of the leg in general. There is really nothing significant that I can culture here so I am just can have to treat prophylactically and see if we can get this under control. The patient voiced understanding and does realize what is going on as well as we discussed that today. 1/25; patient presents for follow-up. She reports finishing her course of antibiotics. She reports improvement in her symptoms. She is only able to tolerate the compression wrap for a few days before she takes this off herself. It is unclear if she is dressing the wound when she takes the compression wraps off. She is scheduled for her venous reflux studies tomorrow. She currently denies signs of infection. 12/29/2021 upon evaluation today patient actually appears to be completely healed which is great news. She has been using her compression socks which is good. Fortunately I do not see any signs of active infection locally nor systemically at this time. 01/16/2023 Ms. [name] is an 82 year old female with a past medical history of essential hypertension, venous insufficiency and fibromyalgia that presents to the clinic for a 1-72-monthhistory of increased warmth and erythema to the right lower extremity with areas of skin breakdown. Looking through the EMR she has seen her primary care office for this  issue and has been treated with Keflex, Unna boot and she is also been started on a diuretic. When discussing this with her she seemed unaware of any of the treatments she has had except for the Unna boot for which she states she had an allergic reaction. She currently reports pain to the areas of skin breakdown to the right lower extremity. She has increased warmth and erythema to this area. She has compression stockings but is not wearing them. She currently denies fever/chills, nausea/vomiting. 2/28; patient presents for follow-up. She has been using mupirocin to the open scattered areas however this caused burning pain. She has been taking her clindamycin without issues. She has been using her  Tubigrip. 3/6; patient presents for follow-up. She has been using triamcinolone cream to the right lower extremity along with Tubigrip daily. This has resolved her symptoms. The irritation and itching to her legs has resolved. She has no open wounds. Objective DIONDRA, HAAR ([id number]) [id number] Page 6 of 8 Constitutional Vitals Time Taken: 3:44 PM, Height: 64 in, Weight: 202 lbs, BMI: 34.7, Temperature: 97.4 F, Pulse: 70 bpm, Respiratory Rate: 16 breaths/min, Blood Pressure: 176/89 mmHg. General Notes: Right lower extremity: 2+ pitting edema to the knee. No increased warmth or erythema to the lower extremity. No open wounds. Patient in good spirits. Assessment Active Problems ICD-10 Cellulitis of right lower limb Non-pressure chronic ulcer of other part of right lower leg limited to breakdown of skin Patient has done well with triamcinolone cream. I recommended she continue to wear compression daily to her lower extremities. She may use the triamcinolone cream as needed. She knows to call with any questions or concerns. She may follow-up as needed. Plan Follow-up Appointments: Return Appointment in 1 week. Bathing/ Shower/ Hygiene: May shower; gently cleanse  wound with antibacterial soap, rinse and pat dry prior to dressing wounds Edema Control - Lymphedema / Segmental Compressive Device / Other: Tubigrip single layer applied. - triamsinolone, tubi grip size E apply daily Elevate, Exercise Daily and Avoid Standing for Long Periods of Time. Elevate legs to the level of the heart and pump ankles as often as possible Elevate leg(s) parallel to the floor when sitting. Non-Wound Condition: Cleanse affected area with antibacterial soap and water, - May try to wash with Hibiclens Try Zyrtec or Benedryl 1. Discharge from clinic due to closed wound 2. Compression therapy daily 3. Follow-up as needed Electronic Signature(s) Signed: 01/30/2023 4:16:35 PM By: Kalman Shan DO Entered By: Kalman Shan on 01/30/2023 16:14:20 -------------------------------------------------------------------------------- ROS/PFSH Details Patient Name: Date of Service: [name], [name] 01/30/2023 3:15 PM Medical Record Number: [id number] Patient Account Number: [id number] Date of Birth/Sex: Treating RN: 1941-11-25 (82 y.o. [name] Primary Care Provider: [name] Other Clinician: Referring Provider: Treating Provider/Extender: Randalyn Rhea Weeks in Treatment: 2 Information Obtained From Patient [name], [name] ([id number]) [id number] Page 7 of 8 Eyes Medical History: Positive for: Cataracts Past Medical History Notes: macular degeneration Endocrine Medical History: Negative for: Type I Diabetes; Type II Diabetes Musculoskeletal Medical History: Positive for: Osteoarthritis Oncologic Medical History: Positive for: Received Radiation Past Medical History Notes: Lumpectomy-about 20 years ago HBO Extended History Items Eyes: Cataracts Immunizations Pneumococcal Vaccine: Received Pneumococcal Vaccination: Yes Received Pneumococcal Vaccination On or After 60th Birthday: Yes Implantable  Devices None Family and Social History Never smoker; Marital Status - Married; Alcohol Use: Never; Drug Use: No History; Caffeine Use: Daily Electronic Signature(s) Signed: 01/30/2023 4:16:35 PM By: Kalman Shan DO Signed: 01/30/2023 4:47:39 PM By: Rosalio Loud MSN RN CNS WTA Entered By: Kalman Shan on 01/30/2023 16:16:20 -------------------------------------------------------------------------------- SuperBill Details Patient Name: Date of Service: [name], [name] 01/30/2023 Medical Record Number: [id number] Patient Account Number: [id number] Date of Birth/Sex: Treating RN: 11/12/1941 (82 y.o. [name] Primary Care Provider: [name] Other Clinician: Referring Provider: Treating Provider/Extender: Hewitt Blade in Treatment: 2 Diagnosis Coding ICD-10 Codes Code Description [phone] Cellulitis of right lower limb L97.811 Non-pressure chronic ulcer of other part of right lower leg limited to breakdown of skin Rawlins, Mayuri L ([id number]) [phone].pdf Page 8 of 8 Physician Procedures : CPT4 Code Description Modifier [id number] - WC PHYS LEVEL 3 - EST PT ICD-10  Diagnosis Description L03.115 Cellulitis of right lower limb L97.811 Non-pressure chronic ulcer of other part of right lower leg limited to breakdown of skin Quantity: 1 Electronic Signature(s) Signed: 01/30/2023 4:16:35 PM By: Kalman Shan DO Signed: 01/30/2023 4:47:39 PM By: Rosalio Loud MSN RN CNS WTA Entered By: Rosalio Loud on 01/30/2023 16:15:17

## 2023-03-19 ENCOUNTER — Encounter: Payer: Self-pay | Admitting: Family Medicine

## 2023-03-19 ENCOUNTER — Ambulatory Visit (INDEPENDENT_AMBULATORY_CARE_PROVIDER_SITE_OTHER): Payer: PPO | Admitting: Family Medicine

## 2023-03-19 VITALS — BP 124/80 | HR 75 | Temp 97.8°F | Ht 64.0 in | Wt 192.0 lb

## 2023-03-19 DIAGNOSIS — I10 Essential (primary) hypertension: Secondary | ICD-10-CM | POA: Diagnosis not present

## 2023-03-19 DIAGNOSIS — R609 Edema, unspecified: Secondary | ICD-10-CM

## 2023-03-19 NOTE — Patient Instructions (Addendum)
If you have a fever above 100 degrees, then restart clindamycin and update [place].   Keep taking lasix  a day if your weight is above 190 in the AM.   If you take lasix, then take a potassium tab.    If your AM weight is below 190, then skip lasix and potassium that day.   Let me know how this goes over the next few days (swelling weight BP and redness on the leg).  Take care.  Glad to see you. Try to elevate you legs, limit salt, and use a compression stocking.

## 2023-03-19 NOTE — Progress Notes (Unsigned)
Was off all BP meds.  BP was 170/100 at home- this was about 4-5 days ago. She has taking about 3 doses of lasix in the meantime but not today.  She hasn't been on HCTZ.    Dizzy sensation meaning off balance w/o room spinning, noted when BP was up, better with lower BP.  Swelling in BLE improved with lasix use.  She lost about 10 lbs in the meantime, presumed from inc UOP.  Patient restarted taking clindamycin rx that wound clinic gave her back in February.   No fevers.  Not on potassium.    Meds, vitals, and allergies reviewed.   ROS: Per HPI unless specifically indicated in ROS section   1-2 + BLE.  R shin erythema from chronic stasis changes w/o ulceration.       Knee pain, talk to copland.

## 2023-03-20 ENCOUNTER — Telehealth: Payer: Self-pay | Admitting: *Deleted

## 2023-03-20 LAB — BASIC METABOLIC PANEL
BUN: 21 mg/dL (ref 6–23)
CO2: 31 mEq/L (ref 19–32)
Calcium: 9.5 mg/dL (ref 8.4–10.5)
Chloride: 104 mEq/L (ref 96–112)
Creatinine, Ser: 0.86 mg/dL (ref 0.40–1.20)
GFR: 62.44 mL/min (ref >60.00)
Glucose, Bld: 107 mg/dL — ABNORMAL HIGH (ref 70–99)
Potassium: 4.3 mEq/L (ref 3.5–5.1)
Sodium: 144 mEq/L (ref 135–145)

## 2023-03-20 NOTE — Telephone Encounter (Signed)
Call Mrs. Elliston to try and set up appointment with Dr. Patsy Lager for her knee pain.  Patient did not feel like scheduling at this time but will call the office back when able schedule.  FYI to Dr. Para March.

## 2023-03-20 NOTE — Assessment & Plan Note (Signed)
It looks like she has chronic stasis changes not active infectious cellulitis. If fever above 100 degrees, then restart clindamycin and update [place].   Plan for goal dry weight of 190 pounds right now. Keep taking lasix  a day if weight is above 190 in the AM.   If taking lasix, then take a potassium tab.    If AM weight is below 190, then skip lasix and potassium that day.   I asked her to let me know how this goes over the next few days (swelling weight BP and redness on the leg).  Advised to elevate legs, limit salt, and use a compression stocking.

## 2023-03-20 NOTE — Telephone Encounter (Signed)
Noted. Thanks.

## 2023-03-20 NOTE — Telephone Encounter (Signed)
-----   Message from Joaquim Nam, MD sent at 03/20/2023  3:25 PM EDT ----- [name]-can you please see about getting this patient on the schedule for you and [facility] re: knee pain?  Thanks.

## 2023-03-24 NOTE — Progress Notes (Unsigned)
    [name] Dresner T. Khalil Belote, MD, CAQ Sports Medicine [facility] at [facility] [address] [place], [address]  Phone: [phone]  FAX: [phone]  [name] - 82 y.o. female  MRN [id number]  Date of Birth: September 13, 1941  Date: 03/25/2023  PCP: Joaquim Nam, MD  Referral: Joaquim Nam, MD  No chief complaint on file.  Subjective:   [name] is a 82 y.o. very pleasant female patient with There is no height or weight on file to calculate BMI. who presents with the following:  Patient presents with ongoing bilateral knee pain and bilateral osteoarthritis of the knees.  L knee worse than the right.   Husband has been in the hospital twice.  Can't walk or stand up.     Review of Systems is noted in the HPI, as appropriate  Objective:   There were no vitals taken for this visit.  GEN: No acute distress; alert,appropriate. PULM: Breathing comfortably in no respiratory distress PSYCH: Normally interactive.   Laboratory and Imaging Data:  Assessment and Plan:   ***

## 2023-03-25 ENCOUNTER — Encounter: Payer: Self-pay | Admitting: Family Medicine

## 2023-03-25 ENCOUNTER — Ambulatory Visit (INDEPENDENT_AMBULATORY_CARE_PROVIDER_SITE_OTHER): Payer: PPO | Admitting: Family Medicine

## 2023-03-25 VITALS — BP 130/80 | HR 69 | Temp 98.0°F | Ht 64.0 in | Wt 196.5 lb

## 2023-03-25 DIAGNOSIS — M170 Bilateral primary osteoarthritis of knee: Secondary | ICD-10-CM | POA: Diagnosis not present

## 2023-03-25 DIAGNOSIS — M1712 Unilateral primary osteoarthritis, left knee: Secondary | ICD-10-CM | POA: Diagnosis not present

## 2023-03-25 MED ORDER — TRIAMCINOLONE ACETONIDE 40 MG/ML IJ SUSP
40.0000 mg | Freq: Once | INTRAMUSCULAR | Status: AC
  Administered 2023-03-25: 40 mg via INTRA_ARTICULAR

## 2023-03-29 ENCOUNTER — Other Ambulatory Visit: Payer: Self-pay | Admitting: Internal Medicine

## 2023-06-03 ENCOUNTER — Ambulatory Visit (INDEPENDENT_AMBULATORY_CARE_PROVIDER_SITE_OTHER): Payer: PPO

## 2023-06-03 VITALS — Ht 64.0 in | Wt 192.0 lb

## 2023-06-03 DIAGNOSIS — Z0000 Encounter for general adult medical examination without abnormal findings: Secondary | ICD-10-CM

## 2023-06-03 NOTE — Progress Notes (Signed)
Subjective:   [name] is a 82 y.o. female who presents for Medicare Annual (Subsequent) preventive examination.  Visit Complete: Virtual  I connected with  Claiborne Billings on 06/03/23 by a audio enabled telemedicine application and verified that I am speaking with the correct person using two identifiers.  Patient Location: Home  Provider Location: Office/Clinic  I discussed the limitations of evaluation and management by telemedicine. The patient expressed understanding and agreed to proceed.  Review of Systems      Cardiac Risk Factors include: advanced age (>49men, >27 women);sedentary lifestyle     Objective:    Today's Vitals   06/03/23 1500 06/03/23 1501  Weight: 192 lb (87.1 kg)   Height: 5\' 4"  (1.626 m)   PainSc:  0-No pain   Body mass index is 32.96 kg/m.     06/03/2023    3:08 PM 05/28/2022    3:13 PM 07/30/2021   11:33 AM 04/13/2020    2:16 PM 09/05/2018    3:19 PM 04/26/2017   11:15 AM  Advanced Directives  Does Patient Have a Medical Advance Directive? Yes No No No No No  Type of Estate agent of [place];Living will       Copy of Healthcare Power of Attorney in Chart? No - copy requested       Would patient like information on creating a medical advance directive?  No - Patient declined No - Patient declined No - Patient declined No - Patient declined     Current Medications (verified) Outpatient Encounter Medications as of 06/03/2023  Medication Sig   acetaminophen (TYLENOL) 325 MG tablet Take 2 tablets (650 mg total) by mouth 3 (three) times daily as needed.   furosemide (LASIX) 20 MG tablet Take 1 tablet (20 mg total) by mouth daily.   ibuprofen (ADVIL) 200 MG tablet Take 1-2 tablets (200-400 mg total) by mouth every 8 (eight) hours as needed (with food, for knee pain).   LORazepam (ATIVAN) 1 MG tablet Take 1/4-1/2 tablet by mouth every 8 hours as needed   Multiple Vitamins-Minerals (PRESERVISION AREDS 2 PO) Take by mouth.    triamcinolone cream (KENALOG) 0.1 % Apply 1 Application topically 2 (two) times daily.   potassium chloride SA (KLOR-CON M) 20 MEQ tablet Take 1 tablet (20 mEq total) by mouth daily. (Patient not taking: Reported on 06/03/2023)   No facility-administered encounter medications on file as of 06/03/2023.    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   Past Surgical History:  Procedure Laterality Date   BREAST BIOPSY  07/2000   positive right side, [facility]    BREAST LUMPECTOMY  08/20/2000   right breast, malignant stage I, [facility]   CATARACT EXTRACTION  1988, 1990   FOOT SURGERY  1980   minor    Family History  Problem Relation Age of Onset   Prostate  cancer Brother    Heart disease Brother    Pancreatitis Brother        x 2   Prostate cancer Brother    Alzheimer's disease Father    Dementia Father    Parkinsonism Mother    Heart disease Brother    Heart disease Brother    Breast cancer Paternal Aunt    Colon cancer Neg Hx    Social History   Socioeconomic History   Marital status: Married    Spouse name: Not on file   Number of children: Not on file   Years of education: Not on file   Highest education level: Not on file  Occupational History   Occupation: Housewife  Tobacco Use   Smoking status: Never   Smokeless tobacco: Never  Vaping Use   Vaping Use: Never used  Substance and Sexual Activity    Alcohol use: No    Alcohol/week: 0.0 standard drinks of alcohol   Drug use: No   Sexual activity: Not Currently  Other Topics Concern   Not on file  Social History Narrative   Patient does not get regular exercise   Daily caffeine-one per day   Married 1958   Retired from [employer]   Social Determinants of [employer] Strain: Low Risk  (06/03/2023)   Overall Financial Resource Strain (CARDIA)    Difficulty of Paying Living Expenses: Not hard at all  Food Insecurity: No Food Insecurity (06/03/2023)   Hunger Vital Sign    Worried About Running Out of Food in the Last Year: Never true    Ran Out of Food in the Last Year: Never true  Transportation Needs: No Transportation Needs (06/03/2023)   PRAPARE - Administrator, Civil Service (Medical): No    Lack of Transportation (Non-Medical): No  Physical Activity: Inactive (06/03/2023)   Exercise Vital Sign    Days of Exercise per Week: 0 days    Minutes of Exercise per Session: 0 min  Stress: Stress Concern Present (06/03/2023)   [employer] of Occupational Health - Occupational Stress Questionnaire    Feeling of Stress : To some extent  Social Connections: Moderately Isolated (06/03/2023)   Social Connection and Isolation Panel [NHANES]    Frequency of Communication with Friends and Family: More than three times a week    Frequency of Social Gatherings with Friends and Family: More than three times a week    Attends Religious Services: Never    Database administrator or Organizations: No    Attends Engineer, structural: Never    Marital Status: Married    Tobacco Counseling Counseling given: Not Answered   Clinical Intake:  Pre-visit preparation completed: Yes  Pain : No/denies pain Pain Score: 0-No pain     BMI - recorded: 32.96 Nutritional Risks: None Diabetes: No  How often do you need to have someone help you when you read instructions, pamphlets, or other written materials from your  doctor or pharmacy?: 1 - Never  Interpreter Needed?: No  Information entered by :: C.Daviel Allegretto LPN   Activities of Daily Living    06/03/2023    3:10 PM  In your present state of health, do you have any difficulty performing the following activities:  Hearing? 0  Vision? 0  Difficulty concentrating or making decisions? 0  Walking or climbing stairs? 1  Comment Knee pain  Dressing or bathing? 0  Doing errands, shopping? 0  Preparing Food and eating ? N  Using the Toilet? N  In the past six months, have you accidently leaked urine? N  Do you have problems with loss of bowel control? N  Managing your Medications? N  Managing your Finances? N  Housekeeping or managing your Housekeeping? N    Patient Care Team: Joaquim Nam, MD as PCP - General (Family Medicine)  Indicate any recent Medical Services you may have received from other than [facility] providers in the past year (date may be approximate).     Assessment:   This is a routine wellness examination for [place].  Hearing/Vision screen Hearing Screening - Comments:: No hearing difficulties. Vision Screening - Comments:: Glasses -  Eye - UTD on eye exams  Dietary issues and exercise activities discussed:     Goals Addressed             This Visit's Progress    Patient Stated       Stay active.       Depression Screen    06/03/2023    3:07 PM 12/11/2022    4:16 PM 05/28/2022    3:06 PM 11/01/2020    3:50 PM 04/13/2020    2:17 PM 09/05/2018    3:16 PM 04/26/2017   11:00 AM  PHQ 2/9 Scores  PHQ - 2 Score 0  0 0 0 1 0  PHQ- 9 Score     0 1   Exception Documentation  Patient refusal         Fall Risk    06/03/2023    3:10 PM 03/19/2023    2:39 PM 12/11/2022    4:16 PM 05/28/2022    3:09 PM 11/01/2020    3:50 PM  Fall Risk   Falls in the past year? 0 1 0 1 0  Number falls in past yr: 0 0 0 0 0  Injury with Fall? 0 0 0 0 0  Comment    Hematoma to back of head. Followed by medical attention   Risk for  fall due to : No Fall Risks Impaired mobility Impaired balance/gait No Fall Risks   Follow up Falls prevention discussed;Falls evaluation completed Falls evaluation completed Falls evaluation completed  Falls evaluation completed    MEDICARE RISK AT HOME:  Medicare Risk at Home - 06/03/23 1511     Any stairs in or around the home? Yes    If so, are there any without handrails? No    Home free of loose throw rugs in walkways, pet beds, electrical cords, etc? Yes    Adequate lighting in your home to reduce risk of falls? Yes    Life alert? Yes   Med Alert   Use of a cane, walker or w/c? Yes   walker and cane   Grab bars in the bathroom? No    Shower chair or bench in shower? Yes    Elevated toilet seat or a handicapped toilet? Yes             TIMED UP AND GO:  Was the test performed?  No    Cognitive Function:    04/13/2020    2:19 PM 09/05/2018    3:19 PM 04/26/2017   11:13 AM  MMSE - Mini Mental State Exam  Orientation to time 5 5 5   Orientation to Place 5 5 5   Registration 3 3 3   Attention/ Calculation 5 0 0  Recall 3 3 3   Language- name 2 objects  0 0  Language- repeat 1 1 1  Language- follow 3 step command  3 3  Language- read & follow direction  0 0  Write a sentence  0 0  Copy design  0 0  Total score  20 20        06/03/2023    3:13 PM 05/28/2022    3:14 PM  6CIT Screen  What Year? 0 points 0 points  What month? 0 points 0 points  What time? 0 points 0 points  Count back from 20 0 points 0 points  Months in reverse 0 points 0 points  Repeat phrase 0 points 0 points  Total Score 0 points 0 points    Immunizations Immunization History  Administered Date(s) Administered   Fluad Quad(high Dose 65+) 10/12/2021   Influenza Whole 10/07/2006, 10/22/2007, 09/20/2009, 09/20/2010   Influenza,inj,Quad PF,6+ Mos 08/27/2013, 10/28/2014, 11/07/2016, 12/11/2017, 09/05/2018   PFIZER(Purple Top)SARS-COV-2 Vaccination 12/28/2019, 01/18/2020, 10/26/2020   Pneumococcal  Conjugate-13 02/24/2016   Pneumococcal Polysaccharide-23 08/23/1999, 05/26/2012   Td 07/16/2006, 03/15/2011   Tdap 07/30/2021   Zoster, Live 03/10/2009    TDAP status: Up to date  Flu Vaccine status: Due, Education has been provided regarding the importance of this vaccine. Advised may receive this vaccine at local pharmacy or Health Dept. Aware to provide a copy of the vaccination record if obtained from local pharmacy or Health Dept. Verbalized acceptance and understanding.  Pneumococcal vaccine status: Up to date  Covid-19 vaccine status: Declined, Education has been provided regarding the importance of this vaccine but patient still declined. Advised may receive this vaccine at local pharmacy or Health Dept.or vaccine clinic. Aware to provide a copy of the vaccination record if obtained from local pharmacy or Health Dept. Verbalized acceptance and understanding.  Qualifies for Shingles Vaccine? Yes   Zostavax completed Yes   Shingrix Completed?: No.    Education has been provided regarding the importance of this vaccine. Patient has been advised to call insurance company to determine out of pocket expense if they have not yet received this vaccine. Advised may also receive vaccine at local pharmacy or Health Dept. Verbalized acceptance and understanding.  Screening Tests Health Maintenance  Topic Date Due   Zoster Vaccines- Shingrix (1 of 2) Never done   Colonoscopy  04/13/2024 (Originally 05/30/2017)   INFLUENZA VACCINE  06/27/2023   Medicare Annual Wellness (AWV)  06/02/2024   DTaP/Tdap/Td (4 - Td or Tdap) 07/31/2031   Pneumonia Vaccine 80+ Years old  Completed   DEXA SCAN  Completed   HPV VACCINES  Aged Out   COVID-19 Vaccine  Discontinued    Health Maintenance  Health Maintenance Due  Topic Date Due   Zoster Vaccines- Shingrix (1 of 2) Never done    Colorectal cancer screening: No longer required.   Mammogram status: No longer required due to age.  Bone Density Scan  - pt declined  Lung Cancer Screening: (Low Dose CT Chest recommended if Age 46-80 years, 20 pack-year currently smoking OR have quit w/in 15years.) does not qualify.   Lung Cancer Screening Referral: no  Additional Screening:  Hepatitis C Screening: does not qualify; Completed n/a  Vision Screening: Recommended annual ophthalmology exams for early detection of glaucoma and other disorders of the eye. Is the patient up to date with their annual eye exam?  Yes  Who is the provider or what is the name of the office in which the patient attends annual eye exams? [facility] If pt is not established with a provider, would they like to be referred to  a provider to establish care? No .   Dental Screening: Recommended annual dental exams for proper oral hygiene    Community Resource Referral / Chronic Care Management: CRR required this visit?  No   CCM required this visit?  No     Plan:     I have personally reviewed and noted the following in the patient's chart:   Medical and social history Use of alcohol, tobacco or illicit drugs  Current medications and supplements including opioid prescriptions. Patient is not currently taking opioid prescriptions. Functional ability and status Nutritional status Physical activity Advanced directives List of other physicians Hospitalizations, surgeries, and ER visits in previous 12 months Vitals Screenings to include cognitive, depression, and falls Referrals and appointments  In addition, I have reviewed and discussed with patient certain preventive protocols, quality metrics, and best practice recommendations. A written personalized care plan for preventive services as well as general preventive health recommendations were provided to patient.     Maryan Puls, LPN   0/07/8118   After Visit Summary: (MyChart) Due to this being a telephonic visit, the after visit summary with patients personalized plan was offered to patient via  MyChart   Nurse Notes: none

## 2023-06-03 NOTE — Patient Instructions (Signed)
[name] , Thank you for taking time to come for your Medicare Wellness Visit. I appreciate your ongoing commitment to your health goals. Please review the following plan we discussed and let me know if I can assist you in the future.   These are the goals we discussed:  Goals       Increase water intake      Starting 09/05/2018, I will attempt to drink at least 6-8 glasses daily.       Patient Stated      04/13/2020,  I will maintain and continue medications as prescribed.       Patient Stated      Stay active.      Stay Healthy (pt-stated)        This is a list of the screening recommended for you and due dates:  Health Maintenance  Topic Date Due   Zoster (Shingles) Vaccine (1 of 2) Never done   Medicare Annual Wellness Visit  05/29/2023   Colon Cancer Screening  04/13/2024*   Flu Shot  06/27/2023   DTaP/Tdap/Td vaccine (4 - Td or Tdap) 07/31/2031   Pneumonia Vaccine  Completed   DEXA scan (bone density measurement)  Completed   HPV Vaccine  Aged Out   COVID-19 Vaccine  Discontinued  *Topic was postponed. The date shown is not the original due date.    Advanced directives: Please bring a copy of your health care power of attorney and living will to the office to be added to your chart at your convenience.   Conditions/risks identified: Aim for 30 minutes of exercise or brisk walking, 6-8 glasses of water, and 5 servings of fruits and vegetables each day.   Next appointment: Follow up in one year for your annual wellness visit 06/03/24 @ 1:30 telephone call   Preventive Care 65 Years and Older, Female Preventive care refers to lifestyle choices and visits with your health care provider that can promote health and wellness. What does preventive care include? A yearly physical exam. This is also called an annual well check. Dental exams once or twice a year. Routine eye exams. Ask your health care provider how often you should have your eyes checked. Personal lifestyle  choices, including: Daily care of your teeth and gums. Regular physical activity. Eating a healthy diet. Avoiding tobacco and drug use. Limiting alcohol use. Practicing safe sex. Taking low-dose aspirin every day. Taking vitamin and mineral supplements as recommended by your health care provider. What happens during an annual well check? The services and screenings done by your health care provider during your annual well check will depend on your age, overall health, lifestyle risk factors, and family history of disease. Counseling  Your health care provider may ask you questions about your: Alcohol use. Tobacco use. Drug use. Emotional well-being. Home and relationship well-being. Sexual activity. Eating habits. History of falls. Memory and ability to understand (cognition). Work and work Astronomer. Reproductive health. Screening  You may have the following tests or measurements: Height, weight, and BMI. Blood pressure. Lipid and cholesterol levels. These may be checked every 5 years, or more frequently if you are over 88 years old. Skin check. Lung cancer screening. You may have this screening every year starting at age 42 if you have a 30-pack-year history of smoking and currently smoke or have quit within the past 15 years. Fecal occult blood test (FOBT) of the stool. You may have this test every year starting at age 41. Flexible sigmoidoscopy or colonoscopy.  You may have a sigmoidoscopy every 5 years or a colonoscopy every 10 years starting at age 33. Hepatitis C blood test. Hepatitis B blood test. Sexually transmitted disease (STD) testing. Diabetes screening. This is done by checking your blood sugar (glucose) after you have not eaten for a while (fasting). You may have this done every 1-3 years. Bone density scan. This is done to screen for osteoporosis. You may have this done starting at age 26. Mammogram. This may be done every 1-2 years. Talk to your health care  provider about how often you should have regular mammograms. Talk with your health care provider about your test results, treatment options, and if necessary, the need for more tests. Vaccines  Your health care provider may recommend certain vaccines, such as: Influenza vaccine. This is recommended every year. Tetanus, diphtheria, and acellular pertussis (Tdap, Td) vaccine. You may need a Td booster every 10 years. Zoster vaccine. You may need this after age 18. Pneumococcal 13-valent conjugate (PCV13) vaccine. One dose is recommended after age 28. Pneumococcal polysaccharide (PPSV23) vaccine. One dose is recommended after age 8. Talk to your health care provider about which screenings and vaccines you need and how often you need them. This information is not intended to replace advice given to you by your health care provider. Make sure you discuss any questions you have with your health care provider. Document Released: 12/09/2015 Document Revised: 08/01/2016 Document Reviewed: 09/13/2015 Elsevier Interactive Patient Education  2017 [employer].  Fall Prevention in the Home Falls can cause injuries. They can happen to people of all ages. There are many things you can do to make your home safe and to help prevent falls. What can I do on the outside of my home? Regularly fix the edges of walkways and driveways and fix any cracks. Remove anything that might make you trip as you walk through a door, such as a raised step or threshold. Trim any bushes or trees on the path to your home. Use bright outdoor lighting. Clear any walking paths of anything that might make someone trip, such as rocks or tools. Regularly check to see if handrails are loose or broken. Make sure that both sides of any steps have handrails. Any raised decks and porches should have guardrails on the edges. Have any leaves, snow, or ice cleared regularly. Use sand or salt on walking paths during winter. Clean up any  spills in your garage right away. This includes oil or grease spills. What can I do in the bathroom? Use night lights. Install grab bars by the toilet and in the tub and shower. Do not use towel bars as grab bars. Use non-skid mats or decals in the tub or shower. If you need to sit down in the shower, use a plastic, non-slip stool. Keep the floor dry. Clean up any water that spills on the floor as soon as it happens. Remove soap buildup in the tub or shower regularly. Attach bath mats securely with double-sided non-slip rug tape. Do not have throw rugs and other things on the floor that can make you trip. What can I do in the bedroom? Use night lights. Make sure that you have a light by your bed that is easy to reach. Do not use any sheets or blankets that are too big for your bed. They should not hang down onto the floor. Have a firm chair that has side arms. You can use this for support while you get dressed. Do not have  throw rugs and other things on the floor that can make you trip. What can I do in the kitchen? Clean up any spills right away. Avoid walking on wet floors. Keep items that you use a lot in easy-to-reach places. If you need to reach something above you, use a strong step stool that has a grab bar. Keep electrical cords out of the way. Do not use floor polish or wax that makes floors slippery. If you must use wax, use non-skid floor wax. Do not have throw rugs and other things on the floor that can make you trip. What can I do with my stairs? Do not leave any items on the stairs. Make sure that there are handrails on both sides of the stairs and use them. Fix handrails that are broken or loose. Make sure that handrails are as long as the stairways. Check any carpeting to make sure that it is firmly attached to the stairs. Fix any carpet that is loose or worn. Avoid having throw rugs at the top or bottom of the stairs. If you do have throw rugs, attach them to the floor  with carpet tape. Make sure that you have a light switch at the top of the stairs and the bottom of the stairs. If you do not have them, ask someone to add them for you. What else can I do to help prevent falls? Wear shoes that: Do not have high heels. Have rubber bottoms. Are comfortable and fit you well. Are closed at the toe. Do not wear sandals. If you use a stepladder: Make sure that it is fully opened. Do not climb a closed stepladder. Make sure that both sides of the stepladder are locked into place. Ask someone to hold it for you, if possible. Clearly mark and make sure that you can see: Any grab bars or handrails. First and last steps. Where the edge of each step is. Use tools that help you move around (mobility aids) if they are needed. These include: Canes. Walkers. Scooters. Crutches. Turn on the lights when you go into a dark area. Replace any light bulbs as soon as they burn out. Set up your furniture so you have a clear path. Avoid moving your furniture around. If any of your floors are uneven, fix them. If there are any pets around you, be aware of where they are. Review your medicines with your doctor. Some medicines can make you feel dizzy. This can increase your chance of falling. Ask your doctor what other things that you can do to help prevent falls. This information is not intended to replace advice given to you by your health care provider. Make sure you discuss any questions you have with your health care provider. Document Released: 09/08/2009 Document Revised: 04/19/2016 Document Reviewed: 12/17/2014 Elsevier Interactive Patient Education  2017 [employer].

## 2023-07-02 ENCOUNTER — Encounter: Payer: Self-pay | Admitting: Family Medicine

## 2023-07-02 ENCOUNTER — Ambulatory Visit (INDEPENDENT_AMBULATORY_CARE_PROVIDER_SITE_OTHER): Payer: PPO | Admitting: Family Medicine

## 2023-07-02 VITALS — BP 154/78 | HR 75 | Temp 97.5°F | Ht 64.0 in | Wt 195.0 lb

## 2023-07-02 DIAGNOSIS — S81802A Unspecified open wound, left lower leg, initial encounter: Secondary | ICD-10-CM

## 2023-07-02 DIAGNOSIS — R300 Dysuria: Secondary | ICD-10-CM

## 2023-07-02 LAB — POC URINALSYSI DIPSTICK (AUTOMATED)
Bilirubin, UA: NEGATIVE
Blood, UA: NEGATIVE
Glucose, UA: NEGATIVE
Ketones, UA: NEGATIVE
Leukocytes, UA: NEGATIVE
Nitrite, UA: NEGATIVE
Protein, UA: NEGATIVE
Spec Grav, UA: 1.025 (ref 1.010–1.025)
Urobilinogen, UA: 0.2 E.U./dL
pH, UA: 6 (ref 5.0–8.0)

## 2023-07-02 MED ORDER — CEPHALEXIN 500 MG PO CAPS: 500.0000 mg | ORAL_CAPSULE | Freq: Three times a day (TID) | ORAL | 0 refills | Status: DC

## 2023-07-02 MED ORDER — FUROSEMIDE 20 MG PO TABS: 20.0000 mg | ORAL_TABLET | Freq: Every day | ORAL | 0 refills | Status: DC

## 2023-07-02 NOTE — Progress Notes (Signed)
Ph: [phone] Fax: [phone]   Patient ID: [name], female    DOB: September 10, 1941, 82 y.o.   MRN: [id number]  This visit was conducted in person.  BP (!) 154/78   Pulse 75   Temp (!) 97.5 F (36.4 C) (Temporal)   Ht 5\' 4"  (1.626 m)   Wt 195 lb (88.5 kg)   SpO2 98%   BMI 33.47 kg/m   BP Readings from Last 3 Encounters:  07/02/23 (!) 154/78  03/25/23 130/80  03/19/23 124/80    CC: left lower leg injury Subjective:   HPI: [name] is a 82 y.o. female presenting on 07/02/2023 for Leg Injury (C/o wound to lower L leg after dropping a box and the corner cut into leg. Injury happened about 1 wk ago. )   DOI: 1 wk ago  Picked up box out of car trunk - box slipped out of her hands and corner hit left medial lower leg and cut the skin. Deep gash. She's been treating with qtip and alcohol followed by triple abx ointment then bandaid. Mild drainage.  No fevers/chills, nausea, abd pain or streaking redness.  Tried to schedule wound clinic appt - 07/15/2023.   Not diabetic.   Also - having dysuria with worsening urinary urge incontinence. No hematuria, nausea/vomiting, abd discomfort, flank pain. As some lower back discomfort.   Tdap 07/2021 - UTD     Relevant past medical, surgical, family and social history reviewed and updated as indicated. Interim medical history since our last visit reviewed. Allergies and medications reviewed and updated. Outpatient Medications Prior to Visit  Medication Sig Dispense Refill   acetaminophen (TYLENOL) 325 MG tablet Take 2 tablets (650 mg total) by mouth 3 (three) times daily as needed.     ibuprofen (ADVIL) 200 MG tablet Take 1-2 tablets (200-400 mg total) by mouth every 8 (eight) hours as needed (with food, for knee pain).     LORazepam (ATIVAN) 1 MG tablet Take 1/4-1/2 tablet by mouth every 8 hours as needed 30 tablet 0   Multiple Vitamins-Minerals (PRESERVISION AREDS 2 PO) Take by mouth.     triamcinolone cream (KENALOG)  0.1 % Apply 1 Application topically 2 (two) times daily. 454 g 0   furosemide (LASIX) 20 MG tablet Take 1 tablet (20 mg total) by mouth daily. (Patient taking differently: Take 20 mg by mouth daily. As needed) 10 tablet 0   potassium chloride SA (KLOR-CON M) 20 MEQ tablet Take 1 tablet (20 mEq total) by mouth daily. (Patient not taking: Reported on 06/03/2023) 10 tablet 0   No facility-administered medications prior to visit.     Per HPI unless specifically indicated in ROS section below Review of Systems  Objective:  BP (!) 154/78   Pulse 75   Temp (!) 97.5 F (36.4 C) (Temporal)   Ht 5\' 4"  (1.626 m)   Wt 195 lb (88.5 kg)   SpO2 98%   BMI 33.47 kg/m   Wt Readings from Last 3 Encounters:  07/02/23 195 lb (88.5 kg)  06/03/23 192 lb (87.1 kg)  03/25/23 196 lb 8 oz (89.1 kg)      Physical Exam Vitals and nursing note reviewed.  Constitutional:      Appearance: Normal appearance. She is not ill-appearing.  Musculoskeletal:        General: Swelling and tenderness present.     Right lower leg: Edema (tr) present.     Left lower leg: Edema (tr) present.  Skin:  General: Skin is warm and dry.     Capillary Refill: Capillary refill takes less than 2 seconds.     Findings: Erythema (mild) and wound present. No rash.     Comments: Deep wound to right medial lower leg, healing by second intention, mild surrounding erythema without significant drainage or streaking  Neurological:     Mental Status: She is alert.  Psychiatric:        Mood and Affect: Mood normal.        Behavior: Behavior normal.        L lower medial leg   Results for orders placed or performed in visit on 07/02/23  POCT Urinalysis Dipstick (Automated)  Result Value Ref Range   Color, UA yellow    Clarity, UA clear    Glucose, UA Negative Negative   Bilirubin, UA negative    Ketones, UA negative    Spec Grav, UA 1.025 1.010 - 1.025   Blood, UA negative    pH, UA 6.0 5.0 - 8.0   Protein, UA Negative  Negative   Urobilinogen, UA 0.2 0.2 or 1.0 E.U./dL   Nitrite, UA negative    Leukocytes, UA Negative Negative   Lab Results  Component Value Date   WBC 7.8 06/25/2022   HGB 13.6 06/25/2022   HCT 40.3 06/25/2022   MCV 88.9 06/25/2022   PLT 247.0 06/25/2022    Assessment & Plan:   Problem List Items Addressed This Visit     Dysuria    UA today without signs of infection.       Relevant Orders   POCT Urinalysis Dipstick (Automated) (Completed)   Wound of left leg, initial encounter - Primary    Wound suffered 1 wk ago. Will need to heal by second intention. Slowly healing, anticipate due to ongoing topical alcohol use - advised stop this. Instead start daily triple abx ointment with daily dressing change, reviewed home wound care.  She has h/o prior cellulitis. Start oral antibiotic keflex preventative. Wound culture sent.  UTD on tetanus (07/2021) I asked her to return Friday for wound check with PCP as I will be out of office.       Relevant Orders   WOUND CULTURE     Meds ordered this encounter  Medications   furosemide (LASIX) 20 MG tablet    Sig: Take 1 tablet (20 mg total) by mouth daily. As needed    Dispense:  30 tablet    Refill:  0   cephALEXin (KEFLEX) 500 MG capsule    Sig: Take 1 capsule (500 mg total) by mouth 3 (three) times daily.    Dispense:  21 capsule    Refill:  0    Orders Placed This Encounter  Procedures   WOUND CULTURE    Order Specific Question:   Source    Answer:   left lower medial leg   POCT Urinalysis Dipstick (Automated)    Patient Instructions  Urinalysis today  Wound culture sent.  Stop topical alcohol use. Continue daily dressing changes with antibiotic ointment.  Start oral antibiotic sent to pharmacy - keflex 500mg  three times daily for 7 days.  Take lasix daily for next 3-5 days to help with swelling.  Return on Friday for wound check with Dr Para March.   Follow up plan: Return in about 3 days (around 07/05/2023), or if  symptoms worsen or fail to improve.  Eustaquio Boyden, MD

## 2023-07-02 NOTE — Patient Instructions (Addendum)
Urinalysis today  Wound culture sent.  Stop topical alcohol use. Continue daily dressing changes with antibiotic ointment.  Start oral antibiotic sent to pharmacy - keflex 500mg  three times daily for 7 days.  Take lasix daily for next 3-5 days to help with swelling.  Return on Friday for wound check with Dr Para March.

## 2023-07-03 NOTE — Assessment & Plan Note (Signed)
UA today without signs of infection.

## 2023-07-03 NOTE — Assessment & Plan Note (Addendum)
Wound suffered 1 wk ago. Will need to heal by second intention. Slowly healing, anticipate due to ongoing topical alcohol use - advised stop this. Instead start daily triple abx ointment with daily dressing change, reviewed home wound care.  She has h/o prior cellulitis. Start oral antibiotic keflex preventative. Wound culture sent.  UTD on tetanus (07/2021) I asked her to return Friday for wound check with PCP as I will be out of office.

## 2023-07-09 ENCOUNTER — Ambulatory Visit (INDEPENDENT_AMBULATORY_CARE_PROVIDER_SITE_OTHER): Payer: PPO | Admitting: Family Medicine

## 2023-07-09 ENCOUNTER — Encounter: Payer: Self-pay | Admitting: Family Medicine

## 2023-07-09 VITALS — BP 142/84 | HR 77 | Temp 97.8°F | Ht 64.0 in | Wt 197.0 lb

## 2023-07-09 DIAGNOSIS — L97901 Non-pressure chronic ulcer of unspecified part of unspecified lower leg limited to breakdown of skin: Secondary | ICD-10-CM

## 2023-07-09 DIAGNOSIS — R238 Other skin changes: Secondary | ICD-10-CM

## 2023-07-09 MED ORDER — TRIAMCINOLONE ACETONIDE 0.1 % EX CREA: 1.0000 | TOPICAL_CREAM | Freq: Two times a day (BID) | CUTANEOUS | 0 refills | Status: AC

## 2023-07-09 NOTE — Patient Instructions (Signed)
Use triamcinolone cream on the forearm.  Stop when better.   Use a hydrocolloid dressing on the leg wound.  Take care.  Glad to see you.

## 2023-07-09 NOTE — Progress Notes (Unsigned)
Lesion on the L leg.  Prev eval d/w pt.  Not on keflex currently.  No fevers.  No purulent discharge.  She has been covering it with Neosporin and a Band-Aid.  See exam.  She has also noticed itching and skin irritation on the L forearm.    Husband is bed bound.  He can sit up but can't get out of bed on his own.  He needs sig help to get out of bed, ie help from family to get to wheelchair.  Pt can't move him by herself.  He has a leg lesion that is less red than previous per patient report.  Wife can get transport for him in [facility] but not here.  Discussed options.  I have been glad to see him in clinic over the years but it is very difficult for him to get transportation here.  Meds, vitals, and allergies reviewed.   ROS: Per HPI unless specifically indicated in ROS section   [place] supple, no LA Rrr 2x1 cm oval clean based wound on the L medial shin with minimal peripheral irritation.  Doesn't have discharge or spreading redness.   No ulceration but skin irritation on the L forearm.    30 minutes were devoted to patient care in this encounter (this includes time spent reviewing the patient's file/history, interviewing and examining the patient, counseling/reviewing plan with patient).

## 2023-07-10 DIAGNOSIS — R238 Other skin changes: Secondary | ICD-10-CM | POA: Insufficient documentation

## 2023-07-10 NOTE — Assessment & Plan Note (Signed)
Use triamcinolone cream on the forearm.  Stop when better.

## 2023-07-10 NOTE — Assessment & Plan Note (Signed)
Does not appear infected.  She can try using a hydrocolloid dressing.  This is applied at the office visit.  Routine cautions given to patient regarding changing that as needed.  Update me as needed.

## 2023-07-16 ENCOUNTER — Telehealth: Payer: Self-pay

## 2023-07-16 NOTE — Patient Outreach (Signed)
  Care Coordination   07/16/2023 Name: ROSELANI VANSICKEL MRN: [id number] DOB: 02/21/1941   Care Coordination Outreach Attempts:  Successful contact made with patient.  Patient request call back at another time due to currently having dinner.   Follow Up Plan:  Additional outreach attempts will be made to offer the patient care coordination information and services.   Encounter Outcome:  Pt. Request to Call Back   Care Coordination Interventions:  No, not indicated    George Ina [facility] Care Coordination [phone] direct line

## 2023-07-18 ENCOUNTER — Telehealth: Payer: Self-pay

## 2023-07-18 NOTE — Patient Outreach (Signed)
  Care Coordination   07/18/2023 Name: [name] MRN: [id number] DOB: 03-12-1941   Care Coordination Outreach Attempts:  Successful contact made with patient.  Patient states she is currently preparing lunch.  Patient request call back at another time.   Follow Up Plan:  Additional outreach attempts will be made to offer the patient care coordination information and services.   Encounter Outcome:  Pt. Request to Call Back   Care Coordination Interventions:  No, not indicated    George Ina [facility] Care Coordination [phone] direct line

## 2023-07-24 ENCOUNTER — Other Ambulatory Visit: Payer: Self-pay | Admitting: Family Medicine

## 2023-07-26 NOTE — Telephone Encounter (Signed)
Sent. Thanks.   

## 2023-08-28 ENCOUNTER — Encounter: Payer: Self-pay | Admitting: Family Medicine

## 2023-08-28 ENCOUNTER — Telehealth: Payer: Self-pay | Admitting: Family Medicine

## 2023-08-28 MED ORDER — LORAZEPAM 0.5 MG PO TABS: 0.2500 mg | ORAL_TABLET | Freq: Three times a day (TID) | ORAL | 0 refills | Status: DC | PRN

## 2023-08-28 NOTE — Telephone Encounter (Signed)
Pt's daughter, Gavin Pound, called asking if Dr. Para March could prescribe something for the pt for anxiety? Gavin Pound states the pt's anxiety has been bad with dealing with the passing of her husband, Shaleta Ruacho. Call back # [phone]

## 2023-08-28 NOTE — Telephone Encounter (Signed)
Called [name] back and gave her my condolences.  She thanked me for the call.  I sent a prescription for lorazepam for [name], since she had used that in the past.  I asked her to pass my condolences along and I appreciate the help of all involved.

## 2023-08-28 NOTE — Addendum Note (Signed)
Addended by: Joaquim Nam on: 08/28/2023 05:59 PM   Modules accepted: Orders

## 2023-09-19 DIAGNOSIS — R600 Localized edema: Secondary | ICD-10-CM | POA: Diagnosis not present

## 2023-09-19 DIAGNOSIS — L309 Dermatitis, unspecified: Secondary | ICD-10-CM | POA: Diagnosis not present

## 2023-09-19 DIAGNOSIS — I872 Venous insufficiency (chronic) (peripheral): Secondary | ICD-10-CM | POA: Diagnosis not present

## 2023-09-19 DIAGNOSIS — L718 Other rosacea: Secondary | ICD-10-CM | POA: Diagnosis not present

## 2023-10-04 ENCOUNTER — Encounter: Payer: Self-pay | Admitting: Family Medicine

## 2023-10-04 ENCOUNTER — Ambulatory Visit (INDEPENDENT_AMBULATORY_CARE_PROVIDER_SITE_OTHER): Payer: PPO | Admitting: Family Medicine

## 2023-10-04 VITALS — BP 130/80 | HR 68 | Temp 97.6°F | Ht 64.0 in | Wt 187.0 lb

## 2023-10-04 DIAGNOSIS — S81802D Unspecified open wound, left lower leg, subsequent encounter: Secondary | ICD-10-CM | POA: Diagnosis not present

## 2023-10-04 DIAGNOSIS — R600 Localized edema: Secondary | ICD-10-CM | POA: Diagnosis not present

## 2023-10-04 DIAGNOSIS — L231 Allergic contact dermatitis due to adhesives: Secondary | ICD-10-CM | POA: Insufficient documentation

## 2023-10-04 DIAGNOSIS — L03116 Cellulitis of left lower limb: Secondary | ICD-10-CM | POA: Insufficient documentation

## 2023-10-04 MED ORDER — DOXYCYCLINE HYCLATE 100 MG PO TABS: 100.0000 mg | ORAL_TABLET | Freq: Two times a day (BID) | ORAL | 0 refills | Status: DC

## 2023-10-04 NOTE — Progress Notes (Signed)
Patient ID: ZURIAH [name], female    DOB: [birth year], 82 y.o.   MRN: [id number]  This visit was conducted in person.  BP 130/80 (BP Location: Left Arm, Patient Position: Sitting, Cuff Size: Large)   Pulse 68   Temp 97.6 F (36.4 C) (Temporal)   Ht 5\' 4"  (1.626 m)   Wt 187 lb (84.8 kg)   SpO2 98%   BMI 32.10 kg/m    CC:  Chief Complaint  Patient presents with   Leg Wound    Left Lower Leg x 3 month-Has appointment [facility] on 11/22    Subjective:   HPI: [name] is a 82 y.o. female patient of Dr. Lianne Bushy presenting on 10/04/2023 for Leg Wound (Left Lower Leg x 3 month-Has appointment already scheduled with [facility] on 11/22)  Patient with history of left leg wound.  Reviewed last office visit note from PCP August  8 th and 13, 2024 At initial appointment  ( after  dropped box 1 month prior) August 8 with Dr. Sharen Hones she was treated for possible cellulitis with Keflex 500 mg 3 times daily x 7 days, Lasix daily for 3 to 5 days to help with peripheral swelling. At that point wound described as 2 x 1 cm oval clean-based wound to the left medial shin with minimal peripheral irritation.  Not felt to be infected at the time.  Given hydrocolloid dressing.  Today she presents with daughter. She reports left lower ;leg wound now painful in last 1-2 weeks. No change in odor,  has yellow discharge.  No lesion more moist, no longer scabbed.   Now in last few days.. redness in square area  since starting new bandage. Okay sounds good band.  11/19 wound care clinic    Her husband passed away in the last month.   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 has not taken it consistently.  Needs form for DMV handicap plaquard completed. Hx of OA.  Relevant past medical, surgical, family and social history reviewed and updated as indicated. Interim medical history since our last visit reviewed. Allergies  and medications reviewed and updated. Outpatient Medications Prior to Visit  Medication Sig Dispense Refill   acetaminophen (TYLENOL) 325 MG tablet Take 2 tablets (650 mg total) by mouth 3 (three) times daily as needed.     furosemide (LASIX) 20 MG tablet TAKE 1 TABLET (20 MG TOTAL) BY MOUTH DAILY. AS NEEDED 30 tablet 1   ibuprofen (ADVIL) 200 MG tablet Take 1-2 tablets (200-400 mg total) by mouth every 8 (eight) hours as needed (with food, for knee pain).     LORazepam (ATIVAN) 0.5 MG tablet Take 0.5-1 tablets (0.25-0.5 mg total) by mouth every 8 (eight) hours as needed for anxiety (sedation caution). 20 tablet 0   metroNIDAZOLE (METROGEL) 0.75 % gel Apply 1 Application topically daily as needed.     Multiple Vitamins-Minerals (PRESERVISION AREDS 2 PO) Take by mouth.     triamcinolone cream (KENALOG) 0.1 % Apply 1 Application topically 2 (two) times daily. 454 g 0   doxycycline (VIBRAMYCIN) 100 MG capsule Take 100 mg by mouth 2 (two) times daily.     No facility-administered medications prior to visit.     Per HPI unless specifically indicated in ROS section below Review of Systems  Constitutional:  Negative for fatigue and fever.  HENT:  Negative for congestion.   Eyes:  Negative for  pain.  Respiratory:  Negative for cough and shortness of breath.   Cardiovascular:  Negative for chest pain, palpitations and leg swelling.  Gastrointestinal:  Negative for abdominal pain.  Genitourinary:  Negative for dysuria and vaginal bleeding.  Musculoskeletal:  Negative for back pain.  Skin:  Positive for rash and wound.  Neurological:  Negative for syncope, light-headedness and headaches.  Psychiatric/Behavioral:  Positive for dysphoric mood.    Objective:  BP 130/80 (BP Location: Left Arm, Patient Position: Sitting, Cuff Size: Large)   Pulse 68   Temp 97.6 F (36.4 C) (Temporal)   Ht 5\' 4"  (1.626 m)   Wt 187 lb (84.8 kg)   SpO2 98%   BMI 32.10 kg/m   Wt Readings from Last 3 Encounters:   10/04/23 187 lb (84.8 kg)  07/09/23 197 lb (89.4 kg)  07/02/23 195 lb (88.5 kg)      Physical Exam Constitutional:      General: She is not in acute distress.    Appearance: Normal appearance. She is well-developed. She is not ill-appearing or toxic-appearing.  HENT:     Head: Normocephalic.     Right Ear: Hearing, tympanic membrane, ear canal and external ear normal. Tympanic membrane is not erythematous, retracted or bulging.     Left Ear: Hearing, tympanic membrane, ear canal and external ear normal. Tympanic membrane is not erythematous, retracted or bulging.     Nose: No mucosal edema or rhinorrhea.     Right Sinus: No maxillary sinus tenderness or frontal sinus tenderness.     Left Sinus: No maxillary sinus tenderness or frontal sinus tenderness.     Mouth/Throat:     Mouth: Oropharynx is clear and moist and mucous membranes are normal.     Pharynx: Uvula midline.  Eyes:     General: Lids are normal. Lids are everted, no foreign bodies appreciated.     Extraocular Movements: EOM normal.     Conjunctiva/sclera: Conjunctivae normal.     Pupils: Pupils are equal, round, and reactive to light.  Neck:     Thyroid: No thyroid mass or thyromegaly.     Vascular: No carotid bruit.     Trachea: Trachea normal.  Cardiovascular:     Rate and Rhythm: Normal rate and regular rhythm.     Pulses: Normal pulses.     Heart sounds: Normal heart sounds, S1 normal and S2 normal. No murmur heard.    No friction rub. No gallop.  Pulmonary:     Effort: Pulmonary effort is normal. No tachypnea or respiratory distress.     Breath sounds: Normal breath sounds. No decreased breath sounds, wheezing, rhonchi or rales.  Abdominal:     General: Bowel sounds are normal.     Palpations: Abdomen is soft.     Tenderness: There is no abdominal tenderness.  Musculoskeletal:     Cervical back: Normal range of motion and neck supple.     Right lower leg: 3+ Pitting Edema present.     Left lower leg: 3+  Pitting Edema present.  Skin:    General: Skin is warm, dry and intact.     Findings: No rash.  Neurological:     Mental Status: She is alert.  Psychiatric:        Mood and Affect: Mood is not anxious or depressed.        Speech: Speech normal.        Behavior: Behavior normal. Behavior is cooperative.        Thought  Content: Thought content normal.        Cognition and Memory: Cognition and memory normal.        Judgment: Judgment normal.          Results for orders placed or performed in visit on 07/02/23  WOUND CULTURE   Specimen: Wound  Result Value Ref Range   MICRO NUMBER: [id number]    SPECIMEN QUALITY: Adequate    SOURCE: WOUND (SITE NOT SPECIFIED)    STATUS: FINAL    GRAM STAIN:      No organisms or white blood cells seen No epithelial cells seen   RESULT: No Growth   POCT Urinalysis Dipstick (Automated)  Result Value Ref Range   Color, UA yellow    Clarity, UA clear    Glucose, UA Negative Negative   Bilirubin, UA negative    Ketones, UA negative    Spec Grav, UA 1.025 1.010 - 1.025   Blood, UA negative    pH, UA 6.0 5.0 - 8.0   Protein, UA Negative Negative   Urobilinogen, UA 0.2 0.2 or 1.0 E.U./dL   Nitrite, UA negative    Leukocytes, UA Negative Negative    Assessment and Plan  Cellulitis of left leg -     WOUND CULTURE  Leg wound, left, subsequent encounter Assessment & Plan: Chronic leg wound now with new pain and surrounding erythema. Some of the erythema is likely due to contact dermatitis from bandage given linear appearance of erythema. Given increased pain, increased discharge will obtain wound culture and treat with doxycycline 100 mg p.o. twice daily x 10 days given exposure possibly to MRSA in hospital visits with husband.  Return and ER precautions provided. Keep appointment with wound care center November 19.   Peripheral edema Assessment & Plan: Chronic, poor control Contributing to poor healing of leg wound.  Recommended  elevating legs above heart wearing compression hose once infection out of left leg. She will use Lasix 20 mg daily for 3 to 5 days to help with peripheral swelling.   Contact dermatitis due to adhesive bandage Assessment & Plan: Acute, change type of bandage, can use cortisone 10 on irritation and itching around wound.   Other orders -     Doxycycline Hyclate; Take 1 tablet (100 mg total) by mouth 2 (two) times daily.  Dispense: 20 tablet; Refill: 0    No follow-ups on file.   Kerby Nora, MD

## 2023-10-04 NOTE — Patient Instructions (Signed)
Complete consistent and full course of doxycycline 100 mg twice daily for 10 days. Elevate feet above heart.  Can wear compression hose on right lower leg but hold off on left leg while wound being treated for infection. Can apply topical hydrocortisone cream (cortisone 10) to allergic reaction around wound from bandage. Use absorbable nonstick dressing with Coban on wound. Keep appointment as planned with wound care center.

## 2023-10-04 NOTE — Assessment & Plan Note (Signed)
Chronic, poor control Contributing to poor healing of leg wound.  Recommended elevating legs above heart wearing compression hose once infection out of left leg. She will use Lasix 20 mg daily for 3 to 5 days to help with peripheral swelling.

## 2023-10-04 NOTE — Assessment & Plan Note (Addendum)
Chronic leg wound now with new pain and surrounding erythema. Some of the erythema is likely due to contact dermatitis from bandage given linear appearance of erythema. Given increased pain, increased discharge will obtain wound culture and treat with doxycycline 100 mg p.o. twice daily x 10 days given exposure possibly to MRSA in hospital visits with husband.  Return and ER precautions provided. Keep appointment with wound care center November 19.

## 2023-10-04 NOTE — Assessment & Plan Note (Signed)
Acute, change type of bandage, can use cortisone 10 on irritation and itching around wound.

## 2023-10-07 LAB — WOUND CULTURE
MICRO NUMBER:: 15706079
SPECIMEN QUALITY:: ADEQUATE

## 2023-10-15 ENCOUNTER — Ambulatory Visit: Payer: PPO | Admitting: Family Medicine

## 2023-10-15 ENCOUNTER — Encounter: Payer: Self-pay | Admitting: Family Medicine

## 2023-10-15 VITALS — BP 148/84 | HR 66 | Temp 97.6°F | Ht 64.0 in | Wt 187.0 lb

## 2023-10-15 DIAGNOSIS — F4321 Adjustment disorder with depressed mood: Secondary | ICD-10-CM

## 2023-10-15 DIAGNOSIS — Z23 Encounter for immunization: Secondary | ICD-10-CM

## 2023-10-15 MED ORDER — DOXYCYCLINE HYCLATE 100 MG PO TABS: 100.0000 mg | ORAL_TABLET | Freq: Two times a day (BID) | ORAL | Status: DC

## 2023-10-15 NOTE — Progress Notes (Unsigned)
Here today with daughter.  D/w pt about mood.  Husband passed this year.  Condolences offered.  She was caring for him at home. They were married 66 years.  Exhausted at baseline.  Usually getting out of the house daily.  Has been visiting with a cousin of similar age.  Discussed holidays.  Rare use of ativan.    Had been taking doxycycline intermittently with would clinic f/u pending.  Discussed adhering to medications.  Her daughter admits that historically the patient has not adhered to routine medication usage and there is the concern that she would not be compliant with routine use of antidepressants.  She is putting up with knee pain and subsequent gait changes.    Meds, vitals, and allergies reviewed.   ROS: Per HPI unless specifically indicated in ROS section   Nad Ncat Tearful but regains composure. Speech and judgment intact.t Rrr ctab Clean appearing defect in the L medial shin. Dressed with bandaid.

## 2023-10-15 NOTE — Patient Instructions (Addendum)
Please call [facility] about individual counseling.  Address: [address] [place], [place], [place] [address] Phone: [phone]  Keep the follow up with the wound clinic.  Take care.  Glad to see you.

## 2023-10-16 DIAGNOSIS — F4321 Adjustment disorder with depressed mood: Secondary | ICD-10-CM | POA: Insufficient documentation

## 2023-10-16 NOTE — Assessment & Plan Note (Signed)
Discussed counseling versus medication.  See above.  She opted for counseling.  I asked her to please call [facility] about individual counseling.  Address: [address] [place], [place], [place] [address] Phone: [phone]  Is a separate issue, I asked her to keep the follow up with the wound clinic.    34 minutes were devoted to patient care in this encounter (this includes time spent reviewing the patient's file/history, interviewing and examining the patient, counseling/reviewing plan with patient).

## 2023-10-18 ENCOUNTER — Encounter: Payer: PPO | Attending: Physician Assistant | Admitting: Physician Assistant

## 2023-10-18 DIAGNOSIS — I739 Peripheral vascular disease, unspecified: Secondary | ICD-10-CM | POA: Diagnosis not present

## 2023-10-18 DIAGNOSIS — I87332 Chronic venous hypertension (idiopathic) with ulcer and inflammation of left lower extremity: Secondary | ICD-10-CM | POA: Diagnosis not present

## 2023-10-18 DIAGNOSIS — I70248 Atherosclerosis of native arteries of left leg with ulceration of other part of lower left leg: Secondary | ICD-10-CM | POA: Diagnosis not present

## 2023-10-18 DIAGNOSIS — M797 Fibromyalgia: Secondary | ICD-10-CM | POA: Diagnosis not present

## 2023-10-18 DIAGNOSIS — L97822 Non-pressure chronic ulcer of other part of left lower leg with fat layer exposed: Secondary | ICD-10-CM | POA: Diagnosis not present

## 2023-10-18 DIAGNOSIS — M170 Bilateral primary osteoarthritis of knee: Secondary | ICD-10-CM | POA: Diagnosis not present

## 2023-10-18 NOTE — Progress Notes (Signed)
[name], [name] ([id number]) [id number] Page 1 of 8 Visit Report for 10/18/2023 Chief Complaint Document Details Patient Name: Date of Service: [name], [name]. 10/18/2023 8:45 A M Medical Record Number: [id number] Patient Account Number: [id number] Date of Birth/Sex: Treating RN: 24-Nov-1941 (82 y.o. [name] Other Clinician: Referring Provider: Treating Provider/Extender: Allen Derry Self, Referral Weeks in Treatment: 0 Information Obtained from: Patient Chief Complaint Left LE ulcer Electronic Signature(s) Signed: 10/18/2023 9:55:16 AM By: Allen Derry PA-C Previous Signature: 10/18/2023 9:54:58 AM Version By: Allen Derry PA-C Entered By: Allen Derry on 10/18/2023 09:55:16 -------------------------------------------------------------------------------- HPI Details Patient Name: Date of Service: [name], [name]. 10/18/2023 8:45 A M Medical Record Number: [id number] Patient Account Number: [id number] Date of Birth/Sex: Treating RN: 17-Sep-1941 (82 y.o. [name] Other Clinician: Referring Provider: Treating Provider/Extender: Allen Derry Self, Referral Weeks in Treatment: 0 History of Present Illness HPI Description: Readmission: 10-18-2023 upon evaluation today patient appears to be doing poorly currently in regard to a wound over the left lower leg. This is something that apparently occurred as a result of the box falling out of the car when she open the back door that raised up onto her leg this occurred back in the summertime some around July or August they believe possibly July. Nonetheless this is a patient that I have seen previously although the last time I saw her personally was in 2023 although I think she did see Dr. Mikey Bussing earlier this year. Generally they are due to injuries. With that being said I do not have a specific finding as far as  her blood flow is concerned we have several venous studies but not arterial I am getting up sending her for an arterial evaluation at this point since this has not been healing despite her using antibiotic ointment and taking care of this daily I think that there may be something else going on here from a arterial flow's perspective. With that being said we definitely need to initiate treatment with some specific measures to try to get this cleaner. Patient does have a history of bilateral knee osteoarthritis, fibromyalgia, and what appears to be peripheral vascular disease although we know she definitely has peripheral venous disease. Electronic Signature(s) [name] ([id number]) [id number] Page 2 of 8 Signed: 10/18/2023 12:52:11 PM By: Allen Derry PA-C Entered By: Allen Derry on 10/18/2023 12:52:11 -------------------------------------------------------------------------------- Physical Exam Details Patient Name: Date of Service: [name], [name]. 10/18/2023 8:45 A M Medical Record Number: [id number] Patient Account Number: [id number] Date of Birth/Sex: Treating RN: 04-13-1941 (82 y.o. [name] Other Clinician: Referring Provider: Treating Provider/Extender: Allen Derry Self, Referral Weeks in Treatment: 0 Constitutional patient is hypertensive.. pulse regular and within target range for patient.Marland Kitchen respirations regular, non-labored and within target range for patient.Marland Kitchen temperature within target range for patient.. Well-nourished and well-hydrated in no acute distress. Eyes conjunctiva clear no eyelid edema noted. pupils equal round and reactive to light and accommodation. Ears, Nose, Mouth, and Throat no gross abnormality of ear auricles or external auditory canals. normal hearing noted during conversation. mucus membranes moist. Respiratory normal breathing without difficulty. Cardiovascular 1+  dorsalis pedis/posterior tibialis pulses. 1+ pitting edema of the bilateral lower extremities. Musculoskeletal normal gait and posture. no significant deformity or arthritic changes, no loss or range of motion, no clubbing. Psychiatric this patient is able to make decisions and demonstrates good insight into disease process.  Alert and Oriented x 3. pleasant and cooperative. Notes Upon inspection patient's wound bed actually showed signs of good granulation and epithelization at this point. Fortunately I do not see any evidence of active infection locally or systemically which is great news and in general I do believe that she is making headway here towards closure. With that being said it has been very slow as she has been doing with this most of the summer and what appeared. We are now into November and even headed into towards middle of November without a dramatic shift towards the good side of things here. I think her capillary refill is very slow and coupled with the swelling that she has already I think that her peripheral arterial flow may be part of the problem why she is not healing appropriately. Electronic Signature(s) Signed: 10/18/2023 12:53:03 PM By: Allen Derry PA-C Entered By: Allen Derry on 10/18/2023 12:53:03 -------------------------------------------------------------------------------- Physician Orders Details Patient Name: Date of Service: [name], [name]. 10/18/2023 8:45 A [name], [name] ([id number]) [id number] Page 3 of 8 Medical Record Number: [id number] Patient Account Number: [id number] Date of Birth/Sex: Treating RN: 1941/03/15 (82 y.o. 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 Bathing/ Shower/ Hygiene May shower with wound dressing protected with water repellent cover or cast protector. Edema Control - Orders / Instructions Elevate, Exercise Daily and A void Standing for Long Periods of Time. Elevate legs to the level of the heart and pump ankles as often as possible Elevate leg(s) parallel to the floor when sitting. Wound Treatment Wound #3 - Lower Leg Wound Laterality: Left Cleanser: Soap and Water Discharge Instructions: Gently cleanse wound with antibacterial soap, rinse and pat dry prior to dressing wounds Peri-Wound Care: Triamcinolone Acetonide Cream, 0.1%, 15 (g) tube Discharge Instructions: Apply as directed. Prim Dressing: IODOFLEX 0.9% Cadexomer Iodine Pad ary Discharge Instructions: Apply Iodoflex to wound bed only as directed. Secondary Dressing: ABD Pad 5x9 (in/in) Discharge Instructions: Cover with ABD pad Secured With: Medipore T - 17M Medipore H Soft Cloth Surgical T ape ape, 2x2 (in/yd) Secured With: Tubigrip Size D, 3x10 (in/yd) Consults Vascular - patient non compressible, arterial reflux - (ICD10 G38.756 - Chronic venous hypertension (idiopathic) with ulcer and inflammation of left lower extremity) Electronic Signature(s) Signed: 10/18/2023 12:27:52 PM By: Yevonne Pax RN Signed: 10/18/2023 12:54:47 PM By: Allen Derry PA-C Entered By: Yevonne Pax on 10/18/2023 11:47:40 Doubleday, [name] ([id number]) [id number] Page 4 of  8 -------------------------------------------------------------------------------- Problem List Details Patient Name: Date of Service: [name], [name]. 10/18/2023 8:45 A M Medical Record Number: [id number] Patient Account Number: [id number] Date of Birth/Sex: Treating RN: 05/11/1941 (82 y.o. [name] Other Clinician: Referring Provider: Treating Provider/Extender: Allen Derry Self, Referral Weeks in Treatment: 0 Active Problems ICD-10 Encounter Code Description Active Date MDM Diagnosis I87.332 Chronic venous hypertension (idiopathic) with ulcer and inflammation of left 10/18/2023 No Yes lower extremity L97.822 Non-pressure chronic ulcer of other part of  left lower leg with fat layer 10/18/2023 No Yes exposed I73.89 Other specified peripheral vascular diseases 10/18/2023 No Yes M79.7 Fibromyalgia 10/18/2023 No Yes M17.0 Bilateral primary osteoarthritis of knee 10/18/2023 No Yes Inactive Problems Resolved Problems Electronic Signature(s) Signed: 10/18/2023 9:54:47 AM By: Allen Derry PA-C Entered By: Allen Derry on 10/18/2023 09:54:46 -------------------------------------------------------------------------------- Progress Note Details Patient Name: Date of Service: [name], [name]. 10/18/2023 8:45 A M Medical Record Number: [id number] Patient Account Number: [id number] Date of Birth/Sex: Treating RN: [birth year] (82 y.o. [name] Other Clinician: Referring Provider: Treating Provider/Extender: Allen Derry Self, Referral Weeks in Treatment: 0 Subjective Chief Complaint Information obtained from Patient Left LE ulcer [name], [name] ([id number]) [id number] Page 5 of 8 History of Present Illness (HPI) Readmission: 10-18-2023 upon evaluation today patient appears to be doing poorly currently in regard to a wound over the left lower leg. This is  something that apparently occurred as a result of the box falling out of the car when she open the back door that raised up onto her leg this occurred back in the summertime some around July or August they believe possibly July. Nonetheless this is a patient that I have seen previously although the last time I saw her personally was in 2023 although I think she did see Dr. Mikey Bussing earlier this year. Generally they are due to injuries. With that being said I do not have a specific finding as far as her blood flow is concerned we have several venous studies but not arterial I am getting up sending her for an arterial evaluation at this point since this has not been healing despite her using antibiotic ointment and taking care of this daily I think that there may be something else going on here from a arterial flow's perspective. With that being said we definitely need to initiate treatment with some specific measures to try to get this cleaner. Patient does have a history of bilateral knee osteoarthritis, fibromyalgia, and what appears to be peripheral vascular disease although we know she definitely has peripheral venous disease. Patient History Information obtained from Patient. Allergies sulfur, Macrodantin, Bee Pollens, metoprolol, Lipitor Social History Never smoker, Marital Status - Married, Alcohol Use - Never, Drug Use - No History, Caffeine Use - Daily. Medical History Eyes Patient has history of Cataracts Endocrine Denies history of Type I Diabetes, Type II Diabetes Musculoskeletal Patient has history of Osteoarthritis Oncologic Patient has history of Received Radiation Medical A Surgical History Notes nd Eyes macular degeneration Oncologic Lumpectomy-about 20 years ago Objective Constitutional patient is hypertensive.. pulse regular and within target range for patient.Marland Kitchen respirations regular, non-labored and within target range for patient.Marland Kitchen temperature within target range  for patient.. Well-nourished and well-hydrated in no acute distress. Vitals Time Taken: 9:00 AM, Height: 64 in, Source: Stated, Weight: 187 lbs, Source: Stated, BMI: 32.1, Temperature: 97.5 F, Pulse: 70 bpm, Respiratory Rate: 18 breaths/min, Blood Pressure: 165/74 mmHg. Eyes conjunctiva clear no eyelid edema noted. pupils equal round and reactive to light and accommodation. Ears, Nose, Mouth, and Throat no gross abnormality of ear auricles or external auditory canals. normal hearing noted during conversation. mucus membranes moist. Respiratory normal breathing without difficulty. Cardiovascular 1+ dorsalis pedis/posterior tibialis pulses. 1+ pitting edema of the bilateral lower extremities. Musculoskeletal normal gait and posture. no significant deformity or arthritic changes, no loss or range of motion, no clubbing. Psychiatric this patient is able to make decisions and demonstrates good insight into disease process. Alert and Oriented  x 3. pleasant and cooperative. General Notes: Upon inspection patient's wound bed actually showed signs of good granulation and epithelization at this point. Fortunately I do not see any evidence of active infection locally or systemically which is great news and in general I do believe that she is making headway here towards closure. With that being said it has been very slow as she has been doing with this most of the summer and what appeared. We are now into November and even headed into towards middle of November without a dramatic shift towards the good side of things here. I think her capillary refill is very slow and coupled with the swelling that she has already I think that her peripheral arterial flow may be part of the problem why she is not healing appropriately. Integumentary (Hair, Skin) Wound #3 status is Open. Original cause of wound was Trauma. The date acquired was: 06/19/2023. The wound is located on the Left Lower Leg. The wound measures 1.5cm  length x 0.7cm width x 0.7cm depth; 0.825cm^2 area and 0.577cm^3 volume. There is Fat Layer (Subcutaneous Tissue) exposed. There is no tunneling or undermining noted. There is a medium amount of serosanguineous drainage noted. There is small (1-33%) pink granulation within the wound bed. [name], [name] ([id number]) [id number] Page 6 of 8 There is a large (67-100%) amount of necrotic tissue within the wound bed including Adherent Slough. 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 Bathing/ Shower/ Hygiene: May shower with wound dressing protected with water repellent cover or cast protector. Edema Control - Orders / Instructions: Elevate, Exercise Daily and Avoid Standing for Long Periods of Time. Elevate legs to the level of the heart and pump ankles as often as possible Elevate leg(s) parallel to the floor when sitting. Consults ordered were: Vascular - patient non compressible, arterial reflux WOUND #3: - Lower Leg Wound Laterality: Left Cleanser: Soap and Water Discharge Instructions: Gently cleanse wound with antibacterial soap, rinse and pat dry prior to dressing wounds Peri-Wound Care: Triamcinolone Acetonide Cream, 0.1%, 15 (g) tube Discharge Instructions: Apply as directed. Prim Dressing: IODOFLEX 0.9% Cadexomer Iodine Pad ary Discharge Instructions: Apply Iodoflex to wound bed only as directed. Secondary Dressing: ABD Pad 5x9 (in/in) Discharge Instructions: Cover with ABD pad Secured With: Medipore T - 20M Medipore H Soft Cloth Surgical T ape ape, 2x2 (in/yd) Secured With: Tubigrip Size D, 3x10 (in/yd) 1. Based on what I see  I do think that the ideal thing right now will be for [place] to go ahead and get started with a Iodoflex dressing I think this is gena be the ideal way to go. 2. I would recommend as well the patient should continue with the ABD pad to cover followed by roll gauze to secure in place as needed. 3 should use Tubigrip size D for some compression this will be I think good compression without being too tight. We will see patient back for reevaluation in 1 week here in the clinic. If anything worsens or changes patient will contact our office for additional recommendations. Electronic Signature(s) Signed: 10/18/2023 12:53:37 PM By: Allen Derry PA-C Entered By: Allen Derry on 10/18/2023 12:53:37 -------------------------------------------------------------------------------- ROS/PFSH Details Patient Name: Date of Service: [name], [name]. 10/18/2023 8:45 A M Medical Record  Number: [id number] Patient Account Number: [id number] Date of Birth/Sex: Treating RN: February 12, 1941 (82 y.o. [name] Other Clinician: Referring Provider: Treating Provider/Extender: Allen Derry Self, Referral Weeks in Treatment: [address] ALY, CALIFF ([id number]) [id number] Page 7 of 8 Information Obtained From Patient Eyes Medical History: Positive for: Cataracts Past Medical History Notes: macular degeneration Endocrine Medical History: Negative for: Type I Diabetes; Type II Diabetes Musculoskeletal Medical History: Positive for: Osteoarthritis Oncologic Medical History: Positive for: Received Radiation Past Medical History Notes: Lumpectomy-about 20 years ago HBO Extended History Items Eyes: Cataracts Immunizations Pneumococcal Vaccine: Received Pneumococcal Vaccination: Yes Received Pneumococcal Vaccination On or After 60th Birthday: Yes Implantable Devices None Family and Social History Never smoker; Marital Status - Married;  Alcohol Use: Never; Drug Use: No History; Caffeine Use: Daily Electronic Signature(s) Signed: 10/18/2023 12:27:52 PM By: Yevonne Pax RN Signed: 10/18/2023 12:54:47 PM By: Allen Derry PA-C Entered By: Yevonne Pax on 10/18/2023 09:05:10 -------------------------------------------------------------------------------- SuperBill Details Patient Name: Date of Service: [name], [name]. 10/18/2023 Medical Record Number: [id number] Patient Account Number: [id number] Date of Birth/Sex: Treating RN: 06-Dec-1940 (82 y.o. [name] Other Clinician: Referring Provider: Treating Provider/Extender: Allen Derry Self, Referral Weeks in Treatment: 0 Diagnosis Coding ICD-10 Codes [name], [name] ([id number]) [id number] Page 8 of 8 Code Description [phone] Chronic venous hypertension (idiopathic) with ulcer and inflammation of left lower extremity L97.822 Non-pressure chronic ulcer of other part of left lower leg with fat layer exposed I73.89 Other specified peripheral vascular diseases M79.7 Fibromyalgia M17.0 Bilateral primary osteoarthritis of knee Facility Procedures : CPT4 Code: [id number] Description: 99214 - WOUND CARE VISIT-LEV 4 EST PT Modifier: Quantity: 1 Physician Procedures : CPT4 Code Description Modifier [id number] 99214 - WC PHYS LEVEL 4 - EST PT ICD-10 Diagnosis Description I87.332 Chronic venous hypertension (idiopathic) with ulcer and inflammation of left lower extremity L97.822 Non-pressure chronic ulcer of other part  of left lower leg with fat layer exposed I73.89 Other specified peripheral vascular diseases M79.7 Fibromyalgia Quantity: 1 Electronic Signature(s) Signed: 10/18/2023 12:53:54 PM By: Allen Derry PA-C Previous Signature: 10/18/2023 11:45:52 AM Version By: Yevonne Pax RN Entered By: Allen Derry on 10/18/2023 12:53:54

## 2023-10-18 NOTE — Progress Notes (Signed)
RENICA, CRAIL ([id number]) [id number] Page 1 of 10 Visit Report for 10/18/2023 Allergy List Details Patient Name: Date of Service: [name], [name]. 10/18/2023 8:45 A M Medical Record Number: [id number] Patient Account Number: [id number] Date of Birth/Sex: Treating RN: 28-Mar-1941 (82 y.o. [name] Primary Care Arsenio Schnorr: [name] Givens Other Clinician: Referring Kamren Heintzelman: Treating Cato Liburd/Extender: Allen Derry Self, Referral Weeks in Treatment: 0 Allergies Active Allergies sulfur Macrodantin Bee Pollens metoprolol Lipitor Allergy Notes Electronic Signature(s) Signed: 10/18/2023 12:27:52 PM By: Yevonne Pax RN Entered By: Yevonne Pax on 10/18/2023 09:04:27 -------------------------------------------------------------------------------- Arrival Information Details Patient Name: Date of Service: [name], [name]. 10/18/2023 8:45 A M Medical Record Number: [id number] Patient Account Number: [id number] Date of Birth/Sex: Treating RN: Apr 03, 1941 (82 y.o. [name] Primary Care Vicky Schleich: [name] Givens Other Clinician: Referring Rennee Coyne: Treating Daziah Hesler/Extender: Allen Derry Self, Referral Weeks in Treatment: 0 Visit Information Patient Arrived: Wheel Chair Arrival Time: 09:00 Accompanied By: daughter Transfer Assistance: None Patient Identification Verified: Yes Secondary Verification Process Completed: Yes Patient Requires Transmission-Based Precautions: No [name], [name] ([id number]) Patient Requires Transmission-Based Precautions: No Patient Has Alerts: Yes Patient Alerts: NON compressible History Since Last Visit Added or deleted any medications: No Any new allergies or adverse reactions: No Had a fall or experienced change in activities of daily living that may affect risk of falls: No Signs or symptoms of abuse/neglect since last visito No Hospitalized since last visit: No  [id number] Page 2 of 10 Implantable device outside of the clinic excluding cellular tissue based products placed in the center since last visit: No Has Dressing in Place as Prescribed: Yes Pain Present Now: No Electronic Signature(s) Signed: 10/18/2023 12:27:52 PM By: Yevonne Pax RN Entered By: Yevonne Pax on 10/18/2023 09:15:35 -------------------------------------------------------------------------------- Clinic Level of Care Assessment Details Patient Name: Date of Service: [name], [name] 10/18/2023 8:45 A M Medical Record Number: [id number] Patient Account Number: [id number] Date of Birth/Sex: Treating RN: 10-30-1941 (82 y.o. [name] Primary Care Dashawn Bartnick: [name] Givens Other Clinician: Referring Kawena Lyday: Treating Manish Ruggiero/Extender: Allen Derry Self, Referral Weeks in Treatment: 0 Clinic Level of Care Assessment Items TOOL 2 Quantity Score X- 1 0 Use when only an EandM is performed on the INITIAL visit ASSESSMENTS - Nursing Assessment / Reassessment X- 1 20 General Physical Exam (combine w/ comprehensive assessment (listed just below) when performed on new pt. evals) X- 1 25 Comprehensive Assessment (HX, ROS, Risk Assessments, Wounds Hx, etc.) ASSESSMENTS - Wound and Skin A ssessment / Reassessment X - Simple Wound Assessment / Reassessment - one wound 1 5 []  - 0 Complex Wound Assessment / Reassessment - multiple wounds []  - 0 Dermatologic / Skin Assessment (not related to wound area) ASSESSMENTS - Ostomy and/or Continence Assessment and Care []  - 0 Incontinence Assessment and Management []  - 0 Ostomy Care Assessment and Management (repouching, etc.) PROCESS - Coordination of Care X - Simple Patient / Family Education for ongoing care 1 15 []  - 0 Complex (extensive) Patient / Family Education for ongoing care []  - 0 Staff obtains Chiropractor, Records, T Results / Process Orders est []  - 0 Staff telephones HHA, Nursing Homes  / Clarify orders / etc []  - 0 Routine Transfer to another Facility (non-emergent condition) []  - 0 Routine Hospital Admission (non-emergent condition) X- 1 15 New Admissions / Manufacturing engineer / Ordering NPWT Apligraf, etc. , []  - 0 Emergency Hospital Admission (emergent condition) X- 1 10 Simple Discharge Coordination []  - 0 Complex (extensive) Discharge Coordination PROCESS -  Special Needs []  - 0 Pediatric / Minor Patient Management [name], [name] ([id number]) [id number] Page 3 of 10 []  - 0 Isolation Patient Management []  - 0 Hearing / Language / Visual special needs []  - 0 Assessment of Community assistance (transportation, D/C planning, etc.) []  - 0 Additional assistance / Altered mentation []  - 0 Support Surface(s) Assessment (bed, cushion, seat, etc.) INTERVENTIONS - Wound Cleansing / Measurement X- 1 5 Wound Imaging (photographs - any number of wounds) []  - 0 Wound Tracing (instead of photographs) X- 1 5 Simple Wound Measurement - one wound []  - 0 Complex Wound Measurement - multiple wounds X- 1 5 Simple Wound Cleansing - one wound []  - 0 Complex Wound Cleansing - multiple wounds INTERVENTIONS - Wound Dressings X - Small Wound Dressing one or multiple wounds 1 10 []  - 0 Medium Wound Dressing one or multiple wounds []  - 0 Large Wound Dressing one or multiple wounds []  - 0 Application of Medications - injection INTERVENTIONS - Miscellaneous []  - 0 External ear exam []  - 0 Specimen Collection (cultures, biopsies, blood, body fluids, etc.) []  - 0 Specimen(s) / Culture(s) sent or taken to Lab for analysis []  - 0 Patient Transfer (multiple staff / Nurse, adult / Similar devices) []  - 0 Simple Staple / Suture removal (25 or less) []  - 0 Complex Staple / Suture removal (26 or more) []  - 0 Hypo / Hyperglycemic Management (close monitor of Blood Glucose) X- 1 15 Ankle / Brachial Index (ABI) - do not check if billed  separately Has the patient been seen at the hospital within the last three years: Yes Total Score: 130 Level Of Care: New/Established - Level 4 Electronic Signature(s) Signed: 10/18/2023 12:27:52 PM By: Yevonne Pax RN Entered By: Yevonne Pax on 10/18/2023 11:45:47 -------------------------------------------------------------------------------- Encounter Discharge Information Details Patient Name: Date of Service: [name], [name]. 10/18/2023 8:45 A M Medical Record Number: [id number] Patient Account Number: [id number] Date of Birth/Sex: Treating RN: 1941-07-25 (82 y.o. [name] Primary Care Aneyah Lortz: [name] Givens Other Clinician: Referring Daquisha Clermont: Treating Fredi Geiler/Extender: Allen Derry Self, Referral Weeks in Treatment: 0 [name], [name] ([id number]) [id number] Page 4 of 10 Encounter Discharge Information Items Discharge Condition: Stable Ambulatory Status: Wheelchair Discharge Destination: Home Transportation: Private Auto Accompanied By: daughter Schedule Follow-up Appointment: Yes Clinical Summary of Care: Electronic Signature(s) Signed: 10/18/2023 11:47:13 AM By: Yevonne Pax RN Entered By: Yevonne Pax on 10/18/2023 11:47:13 -------------------------------------------------------------------------------- Lower Extremity Assessment Details Patient Name: Date of Service: [name], [name]. 10/18/2023 8:45 A M Medical Record Number: [id number] Patient Account Number: [id number] Date of Birth/Sex: Treating RN: 08/03/1941 (82 y.o. [name] Primary Care Eshawn Coor: [name] Givens Other Clinician: Referring Osaze Hubbert: Treating Neeya Prigmore/Extender: Allen Derry Self, Referral Weeks in Treatment: 0 Edema Assessment Assessed: [Left: No] [Right: No] Edema: [Left: Ye] [Right: s] Calf Left: Right: Point of Measurement: 32 cm From Medial Instep 43 cm Ankle Left: Right: Point of Measurement: 10 cm From Medial Instep 29 cm Knee  To Floor Left: Right: From Medial Instep 42 cm Vascular Assessment Pulses: Dorsalis Pedis Palpable: [Left:Yes] Doppler Audible: [Left:Yes] Extremity colors, hair growth, and conditions: Extremity Color: [Left:Normal] Hair Growth on Extremity: [Left:No] Temperature of Extremity: [Left:Warm] Capillary Refill: [Left:< 3 seconds] Dependent Rubor: [Left:No] Blanched when Elevated: [Left:No No] Toe Nail Assessment Left: Right: Thick: No Discolored: Yes Deformed: Yes Rakes, Jahmya [name] ([id number]) [id number] Page 5 of 10 Improper Length and Hygiene: No Notes non compressible Electronic Signature(s) Signed: 10/18/2023 12:27:52 PM By: Yevonne Pax RN  Entered By: Yevonne Pax on 10/18/2023 09:19:08 -------------------------------------------------------------------------------- Multi Wound Chart Details Patient Name: Date of Service: [name], [name] 10/18/2023 8:45 A M Medical Record Number: [id number] Patient Account Number: [id number] Date of Birth/Sex: Treating RN: 06-26-1941 (82 y.o. [name] Primary Care Cuong Moorman: [name] Givens Other Clinician: Referring Elisabet Gutzmer: Treating Markan Cazarez/Extender: Allen Derry Self, Referral Weeks in Treatment: 0 Vital Signs Height(in): 64 Pulse(bpm): 70 Weight(lbs): 187 Blood Pressure(mmHg): 165/74 Body Mass Index(BMI): 32.1 Temperature(F): 97.5 Respiratory Rate(breaths/min): 18 [3:Photos:] [N/A:N/A] Left Lower Leg N/A N/A Wound Location: Trauma N/A N/A Wounding Event: Arterial Insufficiency Ulcer N/A N/A Primary Etiology: Cataracts, Osteoarthritis, Received N/A N/A Comorbid History: Radiation 06/19/2023 N/A N/A Date Acquired: 0 N/A N/A Weeks of Treatment: Open N/A N/A Wound Status: No N/A N/A Wound Recurrence: 1.5x0.7x0.7 N/A N/A Measurements [name] x W x D (cm) 0.825 N/A N/A A (cm) : rea 0.577 N/A N/A Volume (cm) : Full Thickness Without Exposed N/A N/A Classification: Support  Structures Medium N/A N/A Exudate Amount: Serosanguineous N/A N/A Exudate Type: red, brown N/A N/A Exudate Color: Small (1-33%) N/A N/A Granulation Amount: Pink N/A N/A Granulation Quality: Large (67-100%) N/A N/A Necrotic Amount: Fat Layer (Subcutaneous Tissue): Yes N/A N/A Exposed Structures: Fascia: No Tendon: No Muscle: No Joint: No Bone: No None N/A N/A Epithelialization: [name], [name] ([id number]) [id number] Page 6 of 10 Treatment Notes Electronic Signature(s) Signed: 10/18/2023 12:27:52 PM By: Yevonne Pax RN Entered By: Yevonne Pax on 10/18/2023 09:19:15 -------------------------------------------------------------------------------- Multi-Disciplinary Care Plan Details Patient Name: Date of Service: [name], [name]. 10/18/2023 8:45 A M Medical Record Number: [id number] Patient Account Number: [id number] Date of Birth/Sex: Treating RN: [birth year] (82 y.o. [name] Primary Care Arbadella Kimbler: [name] Givens Other Clinician: Referring Shelly Shoultz: Treating Pecolia Marando/Extender: Allen Derry Self, Referral Weeks in Treatment: 0 Active Inactive Wound/Skin Impairment Nursing Diagnoses: Knowledge deficit related to ulceration/compromised skin integrity Goals: Patient/caregiver will verbalize understanding of skin care regimen Date Initiated: 10/18/2023 Target Resolution Date: 11/17/2023 Goal Status: Active Ulcer/skin breakdown will have a volume reduction of 30% by week 4 Date Initiated: 10/18/2023 Target Resolution Date: 11/17/2023 Goal Status: Active Ulcer/skin breakdown will have a volume reduction of 50% by week 8 Date Initiated: 10/18/2023 Target Resolution Date: 12/18/2023 Goal Status: Active Ulcer/skin breakdown will have a volume reduction of 80% by week 12 Date Initiated: 10/18/2023 Target Resolution Date: 01/18/2024 Goal Status: Active Ulcer/skin breakdown will heal within 14 weeks Date Initiated: 10/18/2023 Target  Resolution Date: 02/15/2024 Goal Status: Active Interventions: Assess patient/caregiver ability to obtain necessary supplies Assess patient/caregiver ability to perform ulcer/skin care regimen upon admission and as needed Assess ulceration(s) every visit Notes: Electronic Signature(s) Signed: 10/18/2023 12:27:52 PM By: Yevonne Pax RN Entered By: Yevonne Pax on 10/18/2023 09:20:02 [name], [name] ([id number]) [id number] Page 7 of 10 -------------------------------------------------------------------------------- Pain Assessment Details Patient Name: Date of Service: [name], [name]. 10/18/2023 8:45 A M Medical Record Number: [id number] Patient Account Number: [id number] Date of Birth/Sex: Treating RN: [birth year] (82 y.o. [name] Primary Care Waynette Towers: [name] Givens Other Clinician: Referring Shanautica Forker: Treating Brandie Lopes/Extender: Allen Derry Self, Referral Weeks in Treatment: 0 Active Problems Location of Pain Severity and Description of Pain Patient Has Paino No Site Locations Pain Management and Medication Current Pain Management: Electronic Signature(s) Signed: 10/18/2023 12:27:52 PM By: Yevonne Pax RN Entered By: Yevonne Pax on 10/18/2023 09:02:05 -------------------------------------------------------------------------------- Patient/Caregiver Education Details Patient Name: Date of Service: [name], [name]. 11/22/2024andnbsp8:45 A M Medical Record Number: [id number] Patient Account Number: [id number] Date of Birth/Gender:  Treating RN: 1941/04/08 (82 y.o. [name] Primary Care Physician: [name] Givens Other Clinician: Referring Physician: Treating Physician/Extender: Allen Derry Self, Referral Weeks in Treatment: [address] [name], [name] ([id number]) [id number] Page 8 of 10 Education Assessment Education Provided To: Patient Education Topics Provided Wound/Skin Impairment: Handouts:  Caring for Your Ulcer Methods: Explain/Verbal Responses: State content correctly Electronic Signature(s) Signed: 10/18/2023 12:27:52 PM By: Yevonne Pax RN Entered By: Yevonne Pax on 10/18/2023 09:20:12 -------------------------------------------------------------------------------- Wound Assessment Details Patient Name: Date of Service: [name], [name]. 10/18/2023 8:45 A M Medical Record Number: [id number] Patient Account Number: [id number] Date of Birth/Sex: Treating RN: 11-01-1941 (82 y.o. [name] Primary Care Doneshia Hill: [name] Givens Other Clinician: Referring Kendon Sedeno: Treating Jezebelle Ledwell/Extender: Allen Derry Self, Referral Weeks in Treatment: 0 Wound Status Wound Number: 3 Primary Etiology: Arterial Insufficiency Ulcer Wound Location: Left Lower Leg Wound Status: Open Wounding Event: Trauma Comorbid History: Cataracts, Osteoarthritis, Received Radiation Date Acquired: 06/19/2023 Weeks Of Treatment: 0 Clustered Wound: No Photos Wound Measurements Length: (cm) 1.5 Width: (cm) 0.7 Depth: (cm) 0.7 Area: (cm) 0.825 Volume: (cm) 0.577 % Reduction in Area: % Reduction in Volume: Epithelialization: None Tunneling: No Undermining: No Wound Description Classification: Full Thickness Without Exposed Support Exudate Amount: Medium Exudate Type: Serosanguineous [name], [name] ([id number]) Exudate Color: red, brown Structures Foul Odor After Cleansing: No Slough/Fibrino Yes [id number] Page 9 of 10 Wound Bed Granulation Amount: Small (1-33%) Exposed Structure Granulation Quality: Pink Fascia Exposed: No Necrotic Amount: Large (67-100%) Fat Layer (Subcutaneous Tissue) Exposed: Yes Necrotic Quality: Adherent Slough Tendon Exposed: No Muscle Exposed: No Joint Exposed: No Bone Exposed: No Treatment Notes Wound #3 (Lower Leg) Wound Laterality: Left Cleanser Soap and Water Discharge Instruction: Gently cleanse wound with  antibacterial soap, rinse and pat dry prior to dressing wounds Peri-Wound Care Triamcinolone Acetonide Cream, 0.1%, 15 (g) tube Discharge Instruction: Apply as directed. Topical Primary Dressing IODOFLEX 0.9% Cadexomer Iodine Pad Discharge Instruction: Apply Iodoflex to wound bed only as directed. Secondary Dressing ABD Pad 5x9 (in/in) Discharge Instruction: Cover with ABD pad Secured With Medipore T - 19M Medipore H Soft Cloth Surgical T ape ape, 2x2 (in/yd) Tubigrip Size D, 3x10 (in/yd) Compression Wrap Compression Stockings Add-Ons Electronic Signature(s) Signed: 10/18/2023 12:27:52 PM By: Yevonne Pax RN Entered By: Yevonne Pax on 10/18/2023 09:17:17 -------------------------------------------------------------------------------- Vitals Details Patient Name: Date of Service: [name], [name]. 10/18/2023 8:45 A M Medical Record Number: [id number] Patient Account Number: [id number] Date of Birth/Sex: Treating RN: 08/26/1941 (82 y.o. [name] Primary Care Aaden Buckman: [name] Givens Other Clinician: Referring Matt Delpizzo: Treating Keoki Mchargue/Extender: Allen Derry Self, Referral Weeks in Treatment: 0 Vital Signs Time Taken: 09:00 Temperature (F): 97.5 Height (in): 64 Pulse (bpm): 70 Source: Stated Respiratory Rate (breaths/min): 18 Weight (lbs): 187 Blood Pressure (mmHg): 165/74 [name], [name] ([id number]) [id number] Page 10 of 10 Source: Stated Reference Range: 80 - 120 mg / dl Body Mass Index (BMI): 32.1 Electronic Signature(s) Signed: 10/18/2023 12:27:52 PM By: Yevonne Pax RN Entered By: Yevonne Pax on 10/18/2023 09:02:58

## 2023-10-18 NOTE — Progress Notes (Signed)
[name], [name] ([id number]) [phone] [id number] Page 1 of 5 Visit Report for 10/18/2023 Abuse Risk Screen Details Patient Name: Date of Service: [name], [name]. 10/18/2023 8:45 A M Medical Record Number: [id number] Patient Account Number: [id number] Date of Birth/Sex: Treating RN: [birth year] (82 y.o. [name] Primary Care Venetia Prewitt: [name] Givens Other Clinician: Referring Lisandra Mathisen: Treating Johnpaul Gillentine/Extender: Allen Derry Self, Referral Weeks in Treatment: 0 Abuse Risk Screen Items Answer ABUSE RISK SCREEN: Has anyone close to you tried to hurt or harm you recentlyo No Do you feel uncomfortable with anyone in your familyo No Has anyone forced you do things that you didnt want to doo No Electronic Signature(s) Signed: 10/18/2023 12:27:52 PM By: Yevonne Pax RN Entered By: Yevonne Pax on 10/18/2023 09:05:16 -------------------------------------------------------------------------------- Activities of Daily Living Details Patient Name: Date of Service: [name], [name]. 10/18/2023 8:45 A M Medical Record Number: [id number] Patient Account Number: [id number] Date of Birth/Sex: Treating RN: September 14, 1941 (82 y.o. [name] Primary Care Malikhi Ogan: [name] Givens Other Clinician: Referring Alaina Donati: Treating Esra Frankowski/Extender: Allen Derry Self, Referral Weeks in Treatment: 0 Activities of Daily Living Items Answer Activities of Daily Living (Please select one for each item) Drive Automobile Completely Able T Medications ake Completely Able Use T elephone Completely Able Care for Appearance Completely Able Use T oilet Completely Able Bath / Shower Completely Able Dress Self Completely Able Feed Self Completely Able Walk Completely Able Get In / Out Bed Completely Able Housework Completely Able [name], [name] ([id number]) [id number] Page 2 of 5 Prepare Meals Completely Able Handle Money  Completely Able Shop for Self Completely Able Electronic Signature(s) Signed: 10/18/2023 12:27:52 PM By: Yevonne Pax RN Entered By: Yevonne Pax on 10/18/2023 09:06:01 -------------------------------------------------------------------------------- Education Screening Details Patient Name: Date of Service: [name], [name]. 10/18/2023 8:45 A M Medical Record Number: [id number] Patient Account Number: [id number] Date of Birth/Sex: Treating RN: 1940/11/28 (82 y.o. [name] Primary Care Lyndy Russman: [name] Givens Other Clinician: Referring Dasani Crear: Treating Aurelius Gildersleeve/Extender: Allen Derry Self, Referral Weeks in Treatment: 0 Primary Learner Assessed: Patient Learning Preferences/Education Level/Primary Language Learning Preference: Explanation Highest Education Level: High School Preferred Language: English Cognitive Barrier Language Barrier: No Translator Needed: No Memory Deficit: No Emotional Barrier: No Cultural/Religious Beliefs Affecting Medical Care: No Physical Barrier Impaired Vision: Yes Glasses Impaired Hearing: No Decreased Hand dexterity: No Knowledge/Comprehension Knowledge Level: Medium Comprehension Level: High Ability to understand written instructions: High Ability to understand verbal instructions: High Motivation Anxiety Level: Anxious Cooperation: Cooperative Education Importance: Acknowledges Need Interest in Health Problems: Asks Questions Perception: Coherent Willingness to Engage in Self-Management High Activities: Readiness to Engage in Self-Management High Activities: Electronic Signature(s) Signed: 10/18/2023 12:27:52 PM By: Yevonne Pax RN Entered By: Yevonne Pax on 10/18/2023 09:06:30 [name], [name] ([id number]) [id number] Page 3 of 5 -------------------------------------------------------------------------------- Fall Risk Assessment Details Patient Name: Date of Service: [name], [name] Doctor. 10/18/2023 8:45 A M Medical Record Number: [id number] Patient Account Number: [id number] Date of Birth/Sex: Treating RN: 11-26-1941 (82 y.o. [name] Primary Care Brandon Wiechman: [name] Givens Other Clinician: Referring Josey Dettmann: Treating Taunia Frasco/Extender: Allen Derry Self, Referral Weeks in Treatment: 0 Fall Risk Assessment Items Have you had 2 or more falls in the last 12 monthso 0 No Have you had any fall that resulted in injury in the last 12 monthso 0 No FALLS RISK SCREEN History of falling - immediate or within 3 months 0 No Secondary diagnosis (Do you have 2 or more medical diagnoseso) 0 No  Ambulatory aid None/bed rest/wheelchair/nurse 0 Yes Crutches/cane/walker 0 No Furniture 0 No Intravenous therapy Access/Saline/Heparin Lock 0 No Gait/Transferring Normal/ bed rest/ wheelchair 0 Yes Weak (short steps with or without shuffle, stooped but able to lift head while walking, may seek 0 No support from furniture) Impaired (short steps with shuffle, may have difficulty arising from chair, head down, impaired 0 No balance) Mental Status Oriented to own ability 0 Yes Electronic Signature(s) Signed: 10/18/2023 12:27:52 PM By: Yevonne Pax RN Entered By: Yevonne Pax on 10/18/2023 09:06:39 -------------------------------------------------------------------------------- Foot Assessment Details Patient Name: Date of Service: [name], [name]. 10/18/2023 8:45 A M Medical Record Number: [id number] Patient Account Number: [id number] Date of Birth/Sex: Treating RN: 05-19-1941 (82 y.o. [name] Primary Care Lenya Sterne: [name] Givens Other Clinician: Referring Elvera Almario: Treating Niam Nepomuceno/Extender: Allen Derry Self, Referral Weeks in Treatment: 0 Foot Assessment Items Site Locations [name], [name] [id number] [id number] Page 4 of 5 + = Sensation present, - = Sensation absent, C = Callus, U = Ulcer R = Redness, W = Warmth, M  = Maceration, PU = Pre-ulcerative lesion F = Fissure, S = Swelling, D = Dryness Assessment Right: Left: Other Deformity: No No Prior Foot Ulcer: No No Prior Amputation: No No Charcot Joint: No No Ambulatory Status: Ambulatory Without Help Gait: Steady Electronic Signature(s) Signed: 10/18/2023 12:27:52 PM By: Yevonne Pax RN Entered By: Yevonne Pax on 10/18/2023 09:15:17 -------------------------------------------------------------------------------- Nutrition Risk Screening Details Patient Name: Date of Service: [name], [name]. 10/18/2023 8:45 A M Medical Record Number: [id number] Patient Account Number: [id number] Date of Birth/Sex: Treating RN: [birth year] (82 y.o. [name] Primary Care Brittania Sudbeck: [name] Givens Other Clinician: Referring Wilbert Hayashi: Treating Riggin Cuttino/Extender: Allen Derry Self, Referral Weeks in Treatment: 0 Height (in): 64 Weight (lbs): 187 Body Mass Index (BMI): 32.1 Nutrition Risk Screening Items Score Screening NUTRITION RISK SCREEN: I have an illness or condition that made me change the kind and/or amount of food I eat 0 No I eat fewer than two meals per day 0 No I eat few fruits and vegetables, or milk products 0 No I have three or more drinks of beer, liquor or wine almost every day 0 No I have tooth or mouth problems that make it hard for me to eat 0 No I don't always have enough money to buy the food I need 0 No [name], [name] ([id number]) [id number] Page 5 of 5 I eat alone most of the time 0 No I take three or more different prescribed or over-the-counter drugs a day 1 Yes Without wanting to, I have lost or gained 10 pounds in the last six months 0 No I am not always physically able to shop, cook and/or feed myself 0 No Nutrition Protocols Good Risk Protocol 0 No interventions needed Moderate Risk Protocol High Risk Proctocol Risk Level: Good Risk Score: 1 Electronic Signature(s) Signed:  10/18/2023 12:27:52 PM By: Yevonne Pax RN Entered By: Yevonne Pax on 10/18/2023 09:06:47

## 2023-10-28 ENCOUNTER — Encounter: Payer: PPO | Attending: Physician Assistant | Admitting: Physician Assistant

## 2023-10-28 DIAGNOSIS — I70248 Atherosclerosis of native arteries of left leg with ulceration of other part of lower left leg: Secondary | ICD-10-CM | POA: Diagnosis not present

## 2023-10-28 DIAGNOSIS — M797 Fibromyalgia: Secondary | ICD-10-CM | POA: Diagnosis not present

## 2023-10-28 DIAGNOSIS — M170 Bilateral primary osteoarthritis of knee: Secondary | ICD-10-CM | POA: Insufficient documentation

## 2023-10-28 DIAGNOSIS — L97822 Non-pressure chronic ulcer of other part of left lower leg with fat layer exposed: Secondary | ICD-10-CM | POA: Insufficient documentation
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
## 2023-10-28 NOTE — Progress Notes (Signed)
[name] ([id number]) [id number].pdf Page 1 of 9 Visit Report for 10/28/2023 Arrival Information Details Patient Name: Date of Service: [name], [name] 10/28/2023 12:30 PM Medical Record Number: [id number] Patient Account Number: [id number] Date of Birth/Sex: Treating RN: [birth year] (82 y.o. [name] Primary Care [name]: [name] Other Clinician: Referring [name]: Treating [name]/Extender: [name] in Treatment: 1 Visit Information History Since Last Visit Added or deleted any medications: No Patient Arrived: Ambulatory Any new allergies or adverse reactions: No Arrival Time: 12:34 Had a fall or experienced change in No Accompanied By: daughter activities of daily living that may affect Transfer Assistance: None risk of falls: Patient Identification Verified: Yes Signs or symptoms of abuse/neglect since last visito No Secondary Verification Process Completed: Yes Hospitalized since last visit: No Patient Requires Transmission-Based Precautions: No Implantable device outside of the clinic excluding No Patient Has Alerts: Yes cellular tissue based products placed in the center Patient Alerts: NON compressible since last visit: Has Dressing in Place as Prescribed: Yes Pain Present Now: No Electronic Signature(s) Signed: 10/28/2023 3:33:10 PM By: [name] Pax RN Entered By: [name] on 10/28/2023 12:37:51 -------------------------------------------------------------------------------- Clinic Level of Care Assessment Details Patient Name: Date of Service: [name], [name]. 10/28/2023 12:30 PM Medical Record Number: [id number] Patient Account Number: [id number] Date of Birth/Sex: Treating RN: 1941-05-18 (82 y.o. [name] Primary Care [name]: [name] Other Clinician: Referring [name]: Treating [name]/Extender: [name] in Treatment: 1 Clinic Level of  Care Assessment Items TOOL 4 Quantity Score X- 1 0 Use when only an EandM is performed on FOLLOW-UP visit ASSESSMENTS - Nursing Assessment / Reassessment X- 1 10 Reassessment of Co-morbidities (includes updates in patient status) X- 1 5 Reassessment of Adherence to Treatment Plan [name], [name] ([id number]) [phone].pdf Page 2 of 9 ASSESSMENTS - Wound and Skin A ssessment / Reassessment X - Simple Wound Assessment / Reassessment - one wound 1 5 []  - 0 Complex Wound Assessment / Reassessment - multiple wounds []  - 0 Dermatologic / Skin Assessment (not related to wound area) ASSESSMENTS - Focused Assessment []  - 0 Circumferential Edema Measurements - multi extremities []  - 0 Nutritional Assessment / Counseling / Intervention []  - 0 Lower Extremity Assessment (monofilament, tuning fork, pulses) []  - 0 Peripheral Arterial Disease Assessment (using hand held doppler) ASSESSMENTS - Ostomy and/or Continence Assessment and Care []  - 0 Incontinence Assessment and Management []  - 0 Ostomy Care Assessment and Management (repouching, etc.) PROCESS - Coordination of Care X - Simple Patient / Family Education for ongoing care 1 15 []  - 0 Complex (extensive) Patient / Family Education for ongoing care []  - 0 Staff obtains Chiropractor, Records, T Results / Process Orders est []  - 0 Staff telephones HHA, Nursing Homes / Clarify orders / etc []  - 0 Routine Transfer to another Facility (non-emergent condition) []  - 0 Routine Hospital Admission (non-emergent condition) []  - 0 New Admissions / Manufacturing engineer / Ordering NPWT Apligraf, etc. , []  - 0 Emergency Hospital Admission (emergent condition) X- 1 10 Simple Discharge Coordination []  - 0 Complex (extensive) Discharge Coordination PROCESS - Special Needs []  - 0 Pediatric / Minor Patient Management []  - 0 Isolation Patient Management []  - 0 Hearing / Language / Visual special needs []  -  0 Assessment of Community assistance (transportation, D/C planning, etc.) []  - 0 Additional assistance / Altered mentation []  - 0 Support Surface(s) Assessment (bed, cushion, seat, etc.) INTERVENTIONS - Wound Cleansing / Measurement  X - Simple Wound Cleansing - one wound 1 5 []  - 0 Complex Wound Cleansing - multiple wounds X- 1 5 Wound Imaging (photographs - any number of wounds) []  - 0 Wound Tracing (instead of photographs) X- 1 5 Simple Wound Measurement - one wound []  - 0 Complex Wound Measurement - multiple wounds INTERVENTIONS - Wound Dressings []  - 0 Small Wound Dressing one or multiple wounds []  - 0 Medium Wound Dressing one or multiple wounds []  - 0 Large Wound Dressing one or multiple wounds X- 1 5 Application of Medications - topical []  - 0 Application of Medications - injection INTERVENTIONS - Miscellaneous []  - 0 External ear exam [name] ([id number]) [phone].pdf Page 3 of 9 []  - 0 Specimen Collection (cultures, biopsies, blood, body fluids, etc.) []  - 0 Specimen(s) / Culture(s) sent or taken to Lab for analysis []  - 0 Patient Transfer (multiple staff / Michiel Sites Lift / Similar devices) []  - 0 Simple Staple / Suture removal (25 or less) []  - 0 Complex Staple / Suture removal (26 or more) []  - 0 Hypo / Hyperglycemic Management (close monitor of Blood Glucose) []  - 0 Ankle / Brachial Index (ABI) - do not check if billed separately X- 1 5 Vital Signs Has the patient been seen at the hospital within the last three years: Yes Total Score: 70 Level Of Care: New/Established - Level 2 Electronic Signature(s) Signed: 10/28/2023 3:33:10 PM By: [name] Pax RN Entered By: [name] on 10/28/2023 13:05:57 -------------------------------------------------------------------------------- Encounter Discharge Information Details Patient Name: Date of Service: [name], [name]. 10/28/2023 12:30 PM Medical Record Number:  [id number] Patient Account Number: [id number] Date of Birth/Sex: Treating RN: 1940-12-23 (82 y.o. [name] Primary Care [name]: [name] Other Clinician: Referring [name]: Treating [name]/Extender: [name] in Treatment: 1 Encounter Discharge Information Items Discharge Condition: Stable Ambulatory Status: Walker Discharge Destination: Home Transportation: Private Auto Accompanied By: daughter Schedule Follow-up Appointment: Yes Clinical Summary of Care: Electronic Signature(s) Signed: 10/28/2023 1:06:45 PM By: [name] Pax RN Entered By: [name] on 10/28/2023 13:06:45 -------------------------------------------------------------------------------- Lower Extremity Assessment Details Patient Name: Date of Service: [name], [name]. 10/28/2023 12:30 PM Zetina, [name] ([id number]) [phone].pdf Page 4 of 9 Medical Record Number: [id number] Patient Account Number: [id number] Date of Birth/Sex: Treating RN: 03-05-1941 (82 y.o. [name] Primary Care Jac Romulus: [name] Other Clinician: Referring Annalisa Colonna: Treating Marvette Schamp/Extender: [name] in Treatment: 1 Edema Assessment Assessed: [Left: No] [Right: No] [Left: Edema] [Right: :] Calf Left: Right: Point of Measurement: 32 cm From Medial Instep 41 cm Ankle Left: Right: Point of Measurement: 10 cm From Medial Instep 29 cm Knee To Floor Left: Right: From Medial Instep 43 cm Vascular Assessment Pulses: Dorsalis Pedis Palpable: [Left:Yes] Extremity colors, hair growth, and conditions: Extremity Color: [Left:Hyperpigmented] Hair Growth on Extremity: [Left:No] Temperature of Extremity: [Left:Warm] Capillary Refill: [Left:< 3 seconds] Dependent Rubor: [Left:No] Blanched when Elevated: [Left:No No] Toe Nail Assessment Left: Right: Thick: Yes Discolored: Yes Deformed: Yes Improper Length and Hygiene:  Yes Electronic Signature(s) Signed: 10/28/2023 3:33:10 PM By: [name] Pax RN Entered By: [name] on 10/28/2023 12:44:56 -------------------------------------------------------------------------------- Multi Wound Chart Details Patient Name: Date of Service: [name], [name]. 10/28/2023 12:30 PM Medical Record Number: [id number] Patient Account Number: [id number] Date of Birth/Sex: Treating RN: [birth year] (82 y.o. [name] Primary Care Dayan Kreis: [name] Other Clinician: Referring Sanyiah Kanzler: Treating Dianara Smullen/Extender: [name] in Treatment: 1 Vital Signs Height(in):  64 Pulse(bpm): 76 [name], [name] ([id number]) [phone].pdf Page 5 of 9 Weight(lbs): 187 Blood Pressure(mmHg): 170/82 Body Mass Index(BMI): 32.1 Temperature(F): 98.7 Respiratory Rate(breaths/min): 18 [3:Photos:] [N/A:N/A] Left Lower Leg N/A N/A Wound Location: Trauma N/A N/A Wounding Event: Arterial Insufficiency Ulcer N/A N/A Primary Etiology: Cataracts, Osteoarthritis, Received N/A N/A Comorbid History: Radiation 06/19/2023 N/A N/A Date Acquired: 1 N/A N/A Weeks of Treatment: Open N/A N/A Wound Status: No N/A N/A Wound Recurrence: 1.3x0.7x0.7 N/A N/A Measurements [name] x W x D (cm) 0.715 N/A N/A A (cm) : rea 0.5 N/A N/A Volume (cm) : 13.30% N/A N/A % Reduction in Area: 13.30% N/A N/A % Reduction in Volume: Full Thickness Without Exposed N/A N/A Classification: Support Structures Medium N/A N/A Exudate Amount: Serosanguineous N/A N/A Exudate Type: red, brown N/A N/A Exudate Color: Small (1-33%) N/A N/A Granulation Amount: Pink N/A N/A Granulation Quality: Large (67-100%) N/A N/A Necrotic Amount: Fat Layer (Subcutaneous Tissue): Yes N/A N/A Exposed Structures: Fascia: No Tendon: No Muscle: No Joint: No Bone: No None N/A N/A Epithelialization: Treatment Notes Electronic Signature(s) Signed: 10/28/2023 3:33:10 PM  By: [name] Pax RN Entered By: [name] on 10/28/2023 12:45:09 -------------------------------------------------------------------------------- Multi-Disciplinary Care Plan Details Patient Name: Date of Service: [name], [name]. 10/28/2023 12:30 PM Medical Record Number: [id number] Patient Account Number: [id number] Date of Birth/Sex: Treating RN: Jan 15, 1941 (82 y.o. [name] Primary Care Oyindamola Key: [name] Other Clinician: Referring Adriane Gabbert: Treating Georgann Bramble/Extender: [name] in Treatment: 1 Active Inactive [name], [name] ([id number]) [id number] Page 6 of 9 Wound/Skin Impairment Nursing Diagnoses: Knowledge deficit related to ulceration/compromised skin integrity Goals: Patient/caregiver will verbalize understanding of skin care regimen Date Initiated: 10/18/2023 Target Resolution Date: 11/17/2023 Goal Status: Active Ulcer/skin breakdown will have a volume reduction of 30% by week 4 Date Initiated: 10/18/2023 Target Resolution Date: 11/17/2023 Goal Status: Active Ulcer/skin breakdown will have a volume reduction of 50% by week 8 Date Initiated: 10/18/2023 Target Resolution Date: 12/18/2023 Goal Status: Active Ulcer/skin breakdown will have a volume reduction of 80% by week 12 Date Initiated: 10/18/2023 Target Resolution Date: 01/18/2024 Goal Status: Active Ulcer/skin breakdown will heal within 14 weeks Date Initiated: 10/18/2023 Target Resolution Date: 02/15/2024 Goal Status: Active Interventions: Assess patient/caregiver ability to obtain necessary supplies Assess patient/caregiver ability to perform ulcer/skin care regimen upon admission and as needed Assess ulceration(s) every visit Notes: Electronic Signature(s) Signed: 10/28/2023 3:33:10 PM By: [name] Pax RN Entered By: [name] on 10/28/2023  12:45:25 -------------------------------------------------------------------------------- Pain Assessment Details Patient Name: Date of Service: [name], [name]. 10/28/2023 12:30 PM Medical Record Number: [id number] Patient Account Number: [id number] Date of Birth/Sex: Treating RN: 04-25-1941 (82 y.o. [name] Primary Care Damarko Stitely: [name] Other Clinician: Referring Syncere Kaminski: Treating Tadeusz Stahl/Extender: [name] in Treatment: 1 Active Problems Location of Pain Severity and Description of Pain Patient Has Paino No Site Locations [place], [place] [name] ([id number]) [phone].pdf Page 7 of 9 Pain Management and Medication Current Pain Management: Electronic Signature(s) Signed: 10/28/2023 3:33:10 PM By: [name] Pax RN Entered By: [name] on 10/28/2023 12:38:29 -------------------------------------------------------------------------------- Patient/Caregiver Education Details Patient Name: Date of Service: [name], [name] Philis Pique 12/2/2024andnbsp12:30 PM Medical Record Number: [id number] Patient Account Number: [id number] Date of Birth/Gender: Treating RN: 04-15-1941 (82 y.o. [name] Primary Care Physician: [name] Other Clinician: Referring Physician: Treating Physician/Extender: [name] in Treatment: 1 Education Assessment Education Provided To: Patient Education Topics Provided Wound/Skin Impairment: Handouts: Caring for Your Ulcer Methods: Explain/Verbal Responses: State  content correctly Electronic Signature(s) Signed: 10/28/2023 3:33:10 PM By: [name] Pax RN Entered By: [name] on 10/28/2023 12:45:44 [name], [name] ([id number]) [id number].pdf Page 8 of 9 -------------------------------------------------------------------------------- Wound Assessment Details Patient Name: Date of Service: [name], [name]. 10/28/2023 12:30  PM Medical Record Number: [id number] Patient Account Number: [id number] Date of Birth/Sex: Treating RN: [birth year] (82 y.o. [name] Primary Care Devi Hopman: [name] Other Clinician: Referring Everhett Bozard: Treating Ledon Weihe/Extender: [name] in Treatment: 1 Wound Status Wound Number: 3 Primary Etiology: Arterial Insufficiency Ulcer Wound Location: Left Lower Leg Wound Status: Open Wounding Event: Trauma Comorbid History: Cataracts, Osteoarthritis, Received Radiation Date Acquired: 06/19/2023 Weeks Of Treatment: 1 Clustered Wound: No Photos Wound Measurements Length: (cm) 1.3 Width: (cm) 0.7 Depth: (cm) 0.7 Area: (cm) 0.715 Volume: (cm) 0.5 % Reduction in Area: 13.3% % Reduction in Volume: 13.3% Epithelialization: None Tunneling: No Undermining: No Wound Description Classification: Full Thickness Without Exposed Support Structures Exudate Amount: Medium Exudate Type: Serosanguineous Exudate Color: red, brown Foul Odor After Cleansing: No Slough/Fibrino Yes Wound Bed Granulation Amount: Small (1-33%) Exposed Structure Granulation Quality: Pink Fascia Exposed: No Necrotic Amount: Large (67-100%) Fat Layer (Subcutaneous Tissue) Exposed: Yes Necrotic Quality: Adherent Slough Tendon Exposed: No Muscle Exposed: No Joint Exposed: No Bone Exposed: No Treatment Notes Wound #3 (Lower Leg) Wound Laterality: Left Cleanser Soap and Water Discharge Instruction: Gently cleanse wound with antibacterial soap, rinse and pat dry prior to dressing wounds Peri-Wound Care NEOMI, VIVEROS ([id number]) [phone].pdf Page 9 of 9 Triamcinolone Acetonide Cream, 0.1%, 15 (g) tube Discharge Instruction: Apply as directed. Topical Primary Dressing Gauze Discharge Instruction: moistened with Dakins Solution Secondary Dressing ABD Pad 5x9 (in/in) Discharge Instruction: Cover with ABD pad Secured With Medipore T - 77M Medipore H  Soft Cloth Surgical T ape ape, 2x2 (in/yd) Tubigrip Size D, 3x10 (in/yd) Compression Wrap Compression Stockings Add-Ons Electronic Signature(s) Signed: 10/28/2023 3:33:10 PM By: [name] Pax RN Entered By: [name] on 10/28/2023 12:42:26 -------------------------------------------------------------------------------- Vitals Details Patient Name: Date of Service: [name], [name]. 10/28/2023 12:30 PM Medical Record Number: [id number] Patient Account Number: [id number] Date of Birth/Sex: Treating RN: [birth year] (82 y.o. [name] Primary Care Jeffrey Voth: [name] Other Clinician: Referring Jalacia Mattila: Treating Kalyan Barabas/Extender: [name] in Treatment: 1 Vital Signs Time Taken: 12:38 Temperature (F): 98.7 Height (in): 64 Pulse (bpm): 76 Weight (lbs): 187 Respiratory Rate (breaths/min): 18 Body Mass Index (BMI): 32.1 Blood Pressure (mmHg): 170/82 Reference Range: 80 - 120 mg / dl Electronic Signature(s) Signed: 10/28/2023 3:33:10 PM By: [name] Pax RN Entered By: [name] on 10/28/2023 12:38:18

## 2023-10-29 ENCOUNTER — Other Ambulatory Visit (INDEPENDENT_AMBULATORY_CARE_PROVIDER_SITE_OTHER): Payer: Self-pay | Admitting: Physician Assistant

## 2023-10-29 DIAGNOSIS — I7389 Other specified peripheral vascular diseases: Secondary | ICD-10-CM

## 2023-11-05 ENCOUNTER — Encounter: Payer: PPO | Admitting: Physician Assistant

## 2023-11-05 DIAGNOSIS — I87332 Chronic venous hypertension (idiopathic) with ulcer and inflammation of left lower extremity: Secondary | ICD-10-CM | POA: Diagnosis not present

## 2023-11-05 DIAGNOSIS — L97822 Non-pressure chronic ulcer of other part of left lower leg with fat layer exposed: Secondary | ICD-10-CM | POA: Diagnosis not present

## 2023-11-05 DIAGNOSIS — I70248 Atherosclerosis of native arteries of left leg with ulceration of other part of lower left leg: Secondary | ICD-10-CM | POA: Diagnosis not present

## 2023-11-05 NOTE — Progress Notes (Signed)
RIVERLY, HOGSED ([id number]) [id number] Page 1 of 9 Visit Report for 11/05/2023 Arrival Information Details Patient Name: Date of Service: [name], [name] 11/05/2023 8:45 A M Medical Record Number: [id number] Patient Account Number: [id number] Date of Birth/Sex: Treating RN: [birth year] (82 y.o. [name] Primary Care [name]: [name] Other Clinician: Referring [name]: Treating [name]/Extender: [name] in Treatment: 2 Visit Information History Since Last Visit Added or deleted any medications: No Patient Arrived: [name] Any new allergies or adverse reactions: No Arrival Time: 08:53 Had a fall or experienced change in No Accompanied By: self activities of daily living that may affect Transfer Assistance: None risk of falls: Patient Identification Verified: Yes Signs or symptoms of abuse/neglect since last visito No Secondary Verification Process Completed: Yes Hospitalized since last visit: No Patient Requires Transmission-Based Precautions: No Implantable device outside of the clinic excluding No Patient Has Alerts: Yes cellular tissue based products placed in the center Patient Alerts: NON compressible since last visit: Has Dressing in Place as Prescribed: Yes Pain Present Now: No Electronic Signature(s) Signed: 11/05/2023 3:25:36 PM By: [name] Pax RN Entered By: [name] on 11/05/2023 05:54:52 -------------------------------------------------------------------------------- Clinic Level of Care Assessment Details Patient Name: Date of Service: [name], [name]. 11/05/2023 8:45 A M Medical Record Number: [id number] Patient Account Number: [id number] Date of Birth/Sex: Treating RN: 1941/03/28 (82 y.o. [name] Primary Care [name]: [name] Other Clinician: Referring [name]: Treating [name]/Extender: [name] in Treatment: 2 Clinic Level of  Care Assessment Items TOOL 4 Quantity Score X- 1 0 Use when only an EandM is performed on FOLLOW-UP visit ASSESSMENTS - Nursing Assessment / Reassessment X- 1 10 Reassessment of [name]-morbidities (includes updates in patient status) X- 1 5 Reassessment of Adherence to Treatment Plan [name], [name] ([id number]) [id number] Page 2 of 9 ASSESSMENTS - Wound and Skin A ssessment / Reassessment X - Simple Wound Assessment / Reassessment - one wound 1 5 []  - 0 Complex Wound Assessment / Reassessment - multiple wounds []  - 0 Dermatologic / Skin Assessment (not related to wound area) ASSESSMENTS - Focused Assessment []  - 0 Circumferential Edema Measurements - multi extremities []  - 0 Nutritional Assessment / Counseling / Intervention []  - 0 Lower Extremity Assessment (monofilament, tuning fork, pulses) []  - 0 Peripheral Arterial Disease Assessment (using hand held doppler) ASSESSMENTS - Ostomy and/or Continence Assessment and Care []  - 0 Incontinence Assessment and Management []  - 0 Ostomy Care Assessment and Management (repouching, etc.) PROCESS - Coordination of Care X - Simple Patient / Family Education for ongoing care 1 15 []  - 0 Complex (extensive) Patient / Family Education for ongoing care []  - 0 Staff obtains Chiropractor, Records, T Results / Process Orders est []  - 0 Staff telephones HHA, Nursing Homes / Clarify orders / etc []  - 0 Routine Transfer to another Facility (non-emergent condition) []  - 0 Routine Hospital Admission (non-emergent condition) []  - 0 New Admissions / Manufacturing engineer / Ordering NPWT Apligraf, etc. , []  - 0 Emergency Hospital Admission (emergent condition) X- 1 10 Simple Discharge Coordination []  - 0 Complex (extensive) Discharge Coordination PROCESS - Special Needs []  - 0 Pediatric / Minor Patient Management []  - 0 Isolation Patient Management []  - 0 Hearing / Language / Visual special needs []  -  0 Assessment of Community assistance (transportation, D/C planning, etc.) []  - 0 Additional assistance / Altered mentation []  - 0 Support Surface(s) Assessment (bed, cushion, seat, etc.) INTERVENTIONS - Wound Cleansing /  Measurement X - Simple Wound Cleansing - one wound 1 5 []  - 0 Complex Wound Cleansing - multiple wounds X- 1 5 Wound Imaging (photographs - any number of wounds) []  - 0 Wound Tracing (instead of photographs) X- 1 5 Simple Wound Measurement - one wound []  - 0 Complex Wound Measurement - multiple wounds INTERVENTIONS - Wound Dressings X - Small Wound Dressing one or multiple wounds 1 10 []  - 0 Medium Wound Dressing one or multiple wounds []  - 0 Large Wound Dressing one or multiple wounds []  - 0 Application of Medications - topical []  - 0 Application of Medications - injection INTERVENTIONS - Miscellaneous []  - 0 External ear exam [name], [name] ([id number]) [id number] Page 3 of 9 []  - 0 Specimen Collection (cultures, biopsies, blood, body fluids, etc.) []  - 0 Specimen(s) / Culture(s) sent or taken to Lab for analysis []  - 0 Patient Transfer (multiple staff / Michiel Sites Lift / Similar devices) []  - 0 Simple Staple / Suture removal (25 or less) []  - 0 Complex Staple / Suture removal (26 or more) []  - 0 Hypo / Hyperglycemic Management (close monitor of Blood Glucose) []  - 0 Ankle / Brachial Index (ABI) - do not check if billed separately X- 1 5 Vital Signs Has the patient been seen at the hospital within the last three years: Yes Total Score: 75 Level Of Care: New/Established - Level 2 Electronic Signature(s) Signed: 11/05/2023 3:25:36 PM By: [name] Pax RN Entered By: [name] on 11/05/2023 10:26:35 -------------------------------------------------------------------------------- Encounter Discharge Information Details Patient Name: Date of Service: [name], [name]. 11/05/2023 8:45 A M Medical Record Number:  [id number] Patient Account Number: [id number] Date of Birth/Sex: Treating RN: 08-12-1941 (82 y.o. [name] Primary Care [name]: [name] Other Clinician: Referring Tinna Kolker: Treating Denetra Formoso/Extender: [name] in Treatment: 2 Encounter Discharge Information Items Discharge Condition: Stable Ambulatory Status: Walker Discharge Destination: Home Transportation: Private Auto Accompanied By: self Schedule Follow-up Appointment: Yes Clinical Summary of Care: Electronic Signature(s) Signed: 11/05/2023 1:27:26 PM By: [name] Pax RN Entered By: [name] on 11/05/2023 10:27:26 -------------------------------------------------------------------------------- Lower Extremity Assessment Details Patient Name: Date of Service: [name], [name]. 11/05/2023 8:45 A [name], [name] ([id number]) [id number] Page 4 of 9 Medical Record Number: [id number] Patient Account Number: [id number] Date of Birth/Sex: Treating RN: 05-22-1941 (82 y.o. [name] Primary Care Paras Kreider: [name] Other Clinician: Referring Vyolet Sakuma: Treating Coda Mathey/Extender: [name] in Treatment: 2 Edema Assessment Assessed: [Left: No] [Right: No] Edema: [Left: Ye] [Right: s] Calf Left: Right: Point of Measurement: 32 cm From Medial Instep 41 cm Ankle Left: Right: Point of Measurement: 10 cm From Medial Instep 29 cm Knee To Floor Left: Right: From Medial Instep 43 cm Vascular Assessment Pulses: Dorsalis Pedis Palpable: [Left:Yes] Extremity colors, hair growth, and conditions: Extremity Color: [Left:Normal] Hair Growth on Extremity: [Left:No] Temperature of Extremity: [Left:Warm] Capillary Refill: [Left:< 3 seconds] Dependent Rubor: [Left:No] Blanched when Elevated: [Left:No No] Toe Nail Assessment Left: Right: Thick: No Discolored: No Deformed: No Improper Length and Hygiene: No Electronic  Signature(s) Signed: 11/05/2023 3:25:36 PM By: [name] Pax RN Entered By: [name] on 11/05/2023 06:02:01 -------------------------------------------------------------------------------- Multi Wound Chart Details Patient Name: Date of Service: [name], [name]. 11/05/2023 8:45 A M Medical Record Number: [id number] Patient Account Number: [id number] Date of Birth/Sex: Treating RN: [birth year] (82 y.o. [name] Primary Care Laurann Mcmorris: [name] Other Clinician: Referring Kashif Pooler: Treating Marelin Tat/Extender: [name]  in Treatment: 2 Vital Signs Height(in): 64 Pulse(bpm): 90 [name], [name] ([id number]) [id number] Page 5 of 9 Weight(lbs): 187 Blood Pressure(mmHg): 183/73 Body Mass Index(BMI): 32.1 Temperature(F): 97.6 Respiratory Rate(breaths/min): 18 [3:Photos:] [N/A:N/A] Left Lower Leg N/A N/A Wound Location: Trauma N/A N/A Wounding Event: Arterial Insufficiency Ulcer N/A N/A Primary Etiology: Cataracts, Osteoarthritis, Received N/A N/A Comorbid History: Radiation 06/19/2023 N/A N/A Date Acquired: 2 N/A N/A Weeks of Treatment: Open N/A N/A Wound Status: No N/A N/A Wound Recurrence: 1.3x0.7x0.9 N/A N/A Measurements [name] x W x D (cm) 0.715 N/A N/A A (cm) : rea 0.643 N/A N/A Volume (cm) : 13.30% N/A N/A % Reduction in Area: -11.40% N/A N/A % Reduction in Volume: Full Thickness Without Exposed N/A N/A Classification: Support Structures Medium N/A N/A Exudate Amount: Serosanguineous N/A N/A Exudate Type: red, brown N/A N/A Exudate Color: Small (1-33%) N/A N/A Granulation Amount: Pink N/A N/A Granulation Quality: Large (67-100%) N/A N/A Necrotic Amount: Fat Layer (Subcutaneous Tissue): Yes N/A N/A Exposed Structures: Fascia: No Tendon: No Muscle: No Joint: No Bone: No None N/A N/A Epithelialization: Treatment Notes Electronic Signature(s) Signed: 11/05/2023 3:25:36 PM By: [name] Pax RN Entered By: [name] on 11/05/2023 06:02:06 -------------------------------------------------------------------------------- Multi-Disciplinary Care Plan Details Patient Name: Date of Service: [name], [name]. 11/05/2023 8:45 A M Medical Record Number: [id number] Patient Account Number: [id number] Date of Birth/Sex: Treating RN: 1940/12/03 (82 y.o. [name] Primary Care Junice Fei: [name] Other Clinician: Referring Yvette Loveless: Treating Madai Nuccio/Extender: [name] in Treatment: 2 Active Inactive [name], [name] ([id number]) [id number] Page 6 of 9 Wound/Skin Impairment Nursing Diagnoses: Knowledge deficit related to ulceration/compromised skin integrity Goals: Patient/caregiver will verbalize understanding of skin care regimen Date Initiated: 10/18/2023 Target Resolution Date: 11/17/2023 Goal Status: Active Ulcer/skin breakdown will have a volume reduction of 30% by week 4 Date Initiated: 10/18/2023 Target Resolution Date: 11/17/2023 Goal Status: Active Ulcer/skin breakdown will have a volume reduction of 50% by week 8 Date Initiated: 10/18/2023 Target Resolution Date: 12/18/2023 Goal Status: Active Ulcer/skin breakdown will have a volume reduction of 80% by week 12 Date Initiated: 10/18/2023 Target Resolution Date: 01/18/2024 Goal Status: Active Ulcer/skin breakdown will heal within 14 weeks Date Initiated: 10/18/2023 Target Resolution Date: 02/15/2024 Goal Status: Active Interventions: Assess patient/caregiver ability to obtain necessary supplies Assess patient/caregiver ability to perform ulcer/skin care regimen upon admission and as needed Assess ulceration(s) every visit Notes: Electronic Signature(s) Signed: 11/05/2023 3:25:36 PM By: [name] Pax RN Entered By: [name] on 11/05/2023 06:02:15 -------------------------------------------------------------------------------- Pain  Assessment Details Patient Name: Date of Service: [name], [name]. 11/05/2023 8:45 A M Medical Record Number: [id number] Patient Account Number: [id number] Date of Birth/Sex: Treating RN: Aug 25, 1941 (82 y.o. [name] Primary Care Sharnette Kitamura: [name] Other Clinician: Referring Robynn Marcel: Treating Stephanieann Popescu/Extender: [name] in Treatment: 2 Active Problems Location of Pain Severity and Description of Pain Patient Has Paino No Site Locations [place], Zayante [name] ([id number]) [id number] Page 7 of 9 Pain Management and Medication Current Pain Management: Electronic Signature(s) Signed: 11/05/2023 3:25:36 PM By: [name] Pax RN Entered By: [name] on 11/05/2023 05:55:22 -------------------------------------------------------------------------------- Patient/Caregiver Education Details Patient Name: Date of Service: [name], [name]. 12/10/2024andnbsp8:45 A M Medical Record Number: [id number] Patient Account Number: [id number] Date of Birth/Gender: Treating RN: 1941/11/03 (82 y.o. [name] Primary Care Physician: [name] Other Clinician: Referring Physician: Treating Physician/Extender: [name] in Treatment: 2 Education Assessment Education Provided To: Patient Education Topics Provided Wound/Skin Impairment:  Methods: Explain/Verbal Responses: State content correctly Electronic Signature(s) Signed: 11/05/2023 3:25:36 PM By: [name] Pax RN Entered By: [name] on 11/05/2023 06:02:32 [name], [name] ([id number]) [id number] Page 8 of 9 -------------------------------------------------------------------------------- Wound Assessment Details Patient Name: Date of Service: [name], [name]. 11/05/2023 8:45 A M Medical Record Number: [id number] Patient Account Number: [id number] Date of Birth/Sex: Treating RN: [birth year] (82 y.o. [name] Primary Care Caitriona Sundquist: [name] Other Clinician: Referring Nimah Uphoff: Treating Jamin Panther/Extender: [name] in Treatment: 2 Wound Status Wound Number: 3 Primary Etiology: Arterial Insufficiency Ulcer Wound Location: Left Lower Leg Wound Status: Open Wounding Event: Trauma Comorbid History: Cataracts, Osteoarthritis, Received Radiation Date Acquired: 06/19/2023 Weeks Of Treatment: 2 Clustered Wound: No Photos Wound Measurements Length: (cm) 1.3 Width: (cm) 0.7 Depth: (cm) 0.9 Area: (cm) 0.715 Volume: (cm) 0.643 % Reduction in Area: 13.3% % Reduction in Volume: -11.4% Epithelialization: None Tunneling: No Undermining: No Wound Description Classification: Full Thickness Without Exposed Support Structures Exudate Amount: Medium Exudate Type: Serosanguineous Exudate Color: red, brown Foul Odor After Cleansing: No Slough/Fibrino Yes Wound Bed Granulation Amount: Small (1-33%) Exposed Structure Granulation Quality: Pink Fascia Exposed: No Necrotic Amount: Large (67-100%) Fat Layer (Subcutaneous Tissue) Exposed: Yes Necrotic Quality: Adherent Slough Tendon Exposed: No Muscle Exposed: No Joint Exposed: No Bone Exposed: No Treatment Notes Wound #3 (Lower Leg) Wound Laterality: Left Cleanser Soap and Water Discharge Instruction: Gently cleanse wound with antibacterial soap, rinse and pat dry prior to dressing wounds Peri-Wound Care [name], [name] ([id number]) [id number] Page 9 of 9 Triamcinolone Acetonide Cream, 0.1%, 15 (g) tube Discharge Instruction: Apply as directed. Topical Primary Dressing Gauze Discharge Instruction: moistened with Dakins Solution Secondary Dressing ABD Pad 5x9 (in/in) Discharge Instruction: Cover with ABD pad Secured With Medipore T - 56M Medipore H Soft Cloth Surgical T ape ape, 2x2 (in/yd) Tubigrip Size D, 3x10 (in/yd) Compression Wrap Compression  Stockings Add-Ons Electronic Signature(s) Signed: 11/05/2023 3:25:36 PM By: [name] Pax RN Entered By: [name] on 11/05/2023 06:01:18 -------------------------------------------------------------------------------- Vitals Details Patient Name: Date of Service: [name], [name]. 11/05/2023 8:45 A M Medical Record Number: [id number] Patient Account Number: [id number] Date of Birth/Sex: Treating RN: 1941/06/08 (82 y.o. [name] Primary Care Macdonald Rigor: [name] Other Clinician: Referring Alvina Strother: Treating Magnolia Mattila/Extender: [name] in Treatment: 2 Vital Signs Time Taken: 08:54 Temperature (F): 97.6 Height (in): 64 Pulse (bpm): 90 Weight (lbs): 187 Respiratory Rate (breaths/min): 18 Body Mass Index (BMI): 32.1 Blood Pressure (mmHg): 183/73 Reference Range: 80 - 120 mg / dl Electronic Signature(s) Signed: 11/05/2023 3:25:36 PM By: [name] Pax RN Entered By: [name] on 11/05/2023 05:55:12

## 2023-11-05 NOTE — Progress Notes (Addendum)
[name] ([id number]) [id number] Page 1 of 7 Visit Report for 11/05/2023 Chief Complaint Document Details Patient Name: Date of Service: [name], [name] 11/05/2023 8:45 A M Medical Record Number: [id number] Patient Account Number: [id number] Date of Birth/Sex: Treating RN: [birth year] (82 y.o. [name] Other Clinician: Referring Provider: Treating Provider/Extender: Kari Baars in Treatment: 2 Information Obtained from: Patient Chief Complaint Left LE ulcer Electronic Signature(s) Signed: 11/05/2023 8:59:53 AM By: Allen Derry PA-C Entered By: Allen Derry on 11/05/2023 08:59:53 -------------------------------------------------------------------------------- HPI Details Patient Name: Date of Service: [name], [name] 11/05/2023 8:45 A M Medical Record Number: [id number] Patient Account Number: [id number] Date of Birth/Sex: Treating RN: 15-Oct-1941 (82 y.o. [name] Other Clinician: Referring Provider: Treating Provider/Extender: Kari Baars in Treatment: 2 History of Present Illness HPI Description: Readmission: 10-18-2023 upon evaluation today patient appears to be doing poorly currently in regard to a wound over the left lower leg. This is something that apparently occurred as a result of the box falling out of the car when she open the back door that raised up onto her leg this occurred back in the summertime some around July or August they believe possibly July. Nonetheless this is a patient that I have seen previously although the last time I saw her personally was in 2023 although I think she did see Dr. Mikey Bussing earlier this year. Generally they are due to injuries. With that being said I do not have a specific finding as far as her blood flow is concerned we have several venous studies but not  arterial I am getting up sending her for an arterial evaluation at this point since this has not been healing despite her using antibiotic ointment and taking care of this daily I think that there may be something else going on here from a arterial flow's perspective. With that being said we definitely need to initiate treatment with some specific measures to try to get this cleaner. Patient does have a history of bilateral knee osteoarthritis, fibromyalgia, and what appears to be peripheral vascular disease although we know she definitely has peripheral venous disease. 10-28-2023 upon evaluation today patient appears to be doing well currently in regard to her wound. This is a little bit smaller though still has some depth I think that we are still have a little bit a ways to go to get this to completely heal obviously. Fortunately I do not see any signs of active infection locally or systemically which is good news. No fevers, chills, nausea, vomiting, or diarrhea. [name] ([id number]) [id number] Page 2 of 7 11-05-2023 upon evaluation today patient unfortunately continues to have some issues here with her wound and I feel like it is slowly turning around. With that being said it is a little bit too slow for my liking and I would like to see about getting her tested today for blood flow. Electronic Signature(s) Signed: 11/05/2023 10:04:29 AM By: Allen Derry PA-C Entered By: Allen Derry on 11/05/2023 10:04:29 -------------------------------------------------------------------------------- Physical Exam Details Patient Name: Date of Service: [name], [name] 11/05/2023 8:45 A M Medical Record Number: [id number] Patient Account Number: [id number] Date of Birth/Sex: Treating RN: 03/14/1941 (82 y.o. [name] Other Clinician: Referring Provider: Treating Provider/Extender: [name] in  Treatment: 2 Constitutional Well-nourished and well-hydrated in no acute distress. Respiratory normal breathing without  difficulty. Psychiatric this patient is able to make decisions and demonstrates good insight into disease process. Alert and Oriented x 3. pleasant and cooperative. Notes Upon inspection patient's wound bed actually showed signs of having some improvement although still very slow to heal. I would recommend that we see about getting things tested as far as arterial flow is concerned I recommended ABI and TBI. I am also going to continue with the Dakin's moistened gauze packing for now. Electronic Signature(s) Signed: 11/05/2023 10:04:47 AM By: Allen Derry PA-C Entered By: Allen Derry on 11/05/2023 10:04:47 -------------------------------------------------------------------------------- Physician Orders Details Patient Name: Date of Service: [name], [name] 11/05/2023 8:45 A M Medical Record Number: [id number] Patient Account Number: [id number] Date of Birth/Sex: Treating RN: [birth year] (82 y.o. [name] Other Clinician: Referring Provider: Treating Provider/Extender: [name] in Treatment: 2 The following information was scribed by: Yevonne Pax The information was scribed for: Allen Derry Verbal / Phone Orders: No [name] ([id number]) [id number] Page 3 of 7 Diagnosis Coding ICD-10 Coding Code Description [phone] Chronic venous hypertension (idiopathic) with ulcer and inflammation of left lower extremity L97.822 Non-pressure chronic ulcer of other part of left lower leg with fat layer exposed I73.89 Other specified peripheral vascular diseases M79.7 Fibromyalgia M17.0 Bilateral primary osteoarthritis of knee Follow-up Appointments Return Appointment in 1 week. Bathing/ Shower/ Hygiene May shower with wound dressing protected with water repellent  cover or cast protector. Edema Control - Orders / Instructions Elevate, Exercise Daily and A void Standing for Long Periods of Time. Elevate legs to the level of the heart and pump ankles as often as possible Elevate leg(s) parallel to the floor when sitting. Wound Treatment Wound #3 - Lower Leg Wound Laterality: Left Cleanser: Soap and Water 1 x Per Day/30 Days Discharge Instructions: Gently cleanse wound with antibacterial soap, rinse and pat dry prior to dressing wounds Peri-Wound Care: Triamcinolone Acetonide Cream, 0.1%, 15 (g) tube 1 x Per Day/30 Days Discharge Instructions: Apply as directed. Prim Dressing: Gauze 1 x Per Day/30 Days ary Discharge Instructions: moistened with Dakins Solution Secondary Dressing: ABD Pad 5x9 (in/in) 1 x Per Day/30 Days Discharge Instructions: Cover with ABD pad Secured With: Medipore T - 70M Medipore H Soft Cloth Surgical T ape ape, 2x2 (in/yd) 1 x Per Day/30 Days Secured With: Tubigrip Size D, 3x10 (in/yd) 1 x Per Day/30 Days Laboratory Bacteria identified in Wound by Culture (MICRO) - non healing wound left leg - (ICD10 Q65.784 - Non-pressure chronic ulcer of other part of left lower leg with fat layer exposed) LOINC Code: 6462-6 Convenience Name: Wound culture routine Services and Therapies nkle Brachial Index (ABI)-Bilateral - non healing wound left leg - (ICD10 I73.89 - Other specified peripheral vascular diseases) A Patient Medications llergies: sulfur, Macrodantin, Bee Pollens, metoprolol, Lipitor A Notifications Medication Indication Start End 11/07/2023 levofloxacin DOSE 1 - oral 750 mg tablet - 1 tablet oral once daily x 14 days Electronic Signature(s) Signed: 11/07/2023 5:04:22 PM By: Allen Derry PA-C Previous Signature: 11/05/2023 3:25:36 PM Version By: Yevonne Pax RN Previous Signature: 11/06/2023 7:54:12 PM Version By: Allen Derry PA-C Entered By: Allen Derry on 11/07/2023 17:04:22 Ostrow, [name] ([id number])  [id number] Page 4 of 7 -------------------------------------------------------------------------------- Problem List Details Patient Name: Date of Service: [name], [name] 11/05/2023 8:45 A M Medical Record Number: [id number] Patient Account Number: [id number] Date of Birth/Sex: Treating RN: 08-30-1941 (82 y.o. [name] Other Clinician:  Referring Provider: Treating Provider/Extender: Kari Baars in Treatment: 2 Active Problems ICD-10 Encounter Code Description Active Date MDM Diagnosis I87.332 Chronic venous hypertension (idiopathic) with ulcer and inflammation of left 10/18/2023 No Yes lower extremity L97.822 Non-pressure chronic ulcer of other part of left lower leg with fat layer 10/18/2023 No Yes exposed I73.89 Other specified peripheral vascular diseases 10/18/2023 No Yes M79.7 Fibromyalgia 10/18/2023 No Yes M17.0 Bilateral primary osteoarthritis of knee 10/18/2023 No Yes Inactive Problems Resolved Problems Electronic Signature(s) Signed: 11/05/2023 8:59:49 AM By: Allen Derry PA-C Entered By: Allen Derry on 11/05/2023 08:59:49 -------------------------------------------------------------------------------- Progress Note Details Patient Name: Date of Service: [name], [name] 11/05/2023 8:45 A M Medical Record Number: [id number] Patient Account Number: [id number] [name] ([id number]) [id number] Page 5 of 7 Date of Birth/Sex: Treating RN: [birth year] (82 y.o. [name] Other Clinician: Referring Provider: Treating Provider/Extender: Kari Baars in Treatment: 2 Subjective Chief Complaint Information obtained from Patient Left LE ulcer History of Present Illness (HPI) Readmission: 10-18-2023 upon evaluation today patient appears to be doing poorly currently in  regard to a wound over the left lower leg. This is something that apparently occurred as a result of the box falling out of the car when she open the back door that raised up onto her leg this occurred back in the summertime some around July or August they believe possibly July. Nonetheless this is a patient that I have seen previously although the last time I saw her personally was in 2023 although I think she did see Dr. Mikey Bussing earlier this year. Generally they are due to injuries. With that being said I do not have a specific finding as far as her blood flow is concerned we have several venous studies but not arterial I am getting up sending her for an arterial evaluation at this point since this has not been healing despite her using antibiotic ointment and taking care of this daily I think that there may be something else going on here from a arterial flow's perspective. With that being said we definitely need to initiate treatment with some specific measures to try to get this cleaner. Patient does have a history of bilateral knee osteoarthritis, fibromyalgia, and what appears to be peripheral vascular disease although we know she definitely has peripheral venous disease. 10-28-2023 upon evaluation today patient appears to be doing well currently in regard to her wound. This is a little bit smaller though still has some depth I think that we are still have a little bit a ways to go to get this to completely heal obviously. Fortunately I do not see any signs of active infection locally or systemically which is good news. No fevers, chills, nausea, vomiting, or diarrhea. 11-05-2023 upon evaluation today patient unfortunately continues to have some issues here with her wound and I feel like it is slowly turning around. With that being said it is a little bit too slow for my liking and I would like to see about getting her tested today for blood flow. Objective Constitutional Well-nourished and  well-hydrated in no acute distress. Vitals Time Taken: 8:54 AM, Height: 64 in, Weight: 187 lbs, BMI: 32.1, Temperature: 97.6 F, Pulse: 90 bpm, Respiratory Rate: 18 breaths/min, Blood Pressure: 183/73 mmHg. Respiratory normal breathing without difficulty. Psychiatric this patient is able to make decisions and demonstrates good insight into disease process. Alert and Oriented x 3. pleasant and cooperative. General Notes: Upon inspection patient's wound  bed actually showed signs of having some improvement although still very slow to heal. I would recommend that we see about getting things tested as far as arterial flow is concerned I recommended ABI and TBI. I am also going to continue with the Dakin's moistened gauze packing for now. Integumentary (Hair, Skin) Wound #3 status is Open. Original cause of wound was Trauma. The date acquired was: 06/19/2023. The wound has been in treatment 2 weeks. The wound is located on the Left Lower Leg. The wound measures 1.3cm length x 0.7cm width x 0.9cm depth; 0.715cm^2 area and 0.643cm^3 volume. There is Fat Layer (Subcutaneous Tissue) exposed. There is no tunneling or undermining noted. There is a medium amount of serosanguineous drainage noted. There is small (1- 33%) pink granulation within the wound bed. There is a large (67-100%) amount of necrotic tissue within the wound bed including Adherent Slough. 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 Bathing/ Shower/ Hygiene: May shower with wound dressing protected with water repellent cover or cast  protector. Edema Control - Orders / Instructions: Elevate, Exercise Daily and Avoid Standing for Long Periods of Time. Elevate legs to the level of the heart and pump ankles as often as possible Elevate leg(s) parallel to the floor when sitting. Laboratory ordered were: Wound culture routine left leg - non healing wound left leg Services and Therapies ordered were: Ankle Brachial Index (ABI)-Bilateral - non healing wound left leg WOUND #3: - Lower Leg Wound Laterality: Left Cleanser: Soap and Water 1 x Per Day/30 Days Discharge Instructions: Gently cleanse wound with antibacterial soap, rinse and pat dry prior to dressing wounds Peri-Wound Care: Triamcinolone Acetonide Cream, 0.1%, 15 (g) tube 1 x Per Day/30 Days Discharge Instructions: Apply as directed. Prim Dressing: Gauze 1 x Per Day/30 Days ary Discharge Instructions: moistened with Dakins Solution Secondary Dressing: ABD Pad 5x9 (in/in) 1 x Per Day/30 Days Discharge Instructions: Cover with ABD pad Secured With: Medipore T - 58M Medipore H Soft Cloth Surgical T ape ape, 2x2 (in/yd) 1 x Per Day/30 Days Secured With: Tubigrip Size D, 3x10 (in/yd) 1 x Per Day/30 Days 1. I would recommend at this point that we go ahead and see about get the patient set up for an ABI and TBI study to evaluate and see where things stand. 2. I am also can recommend that the patient should continue to monitor for any signs of infection or worsening. 3. I am also going to to suggest that she should continue to utilize the Dakin's moistened gauze dressing for the time being. Were also going to be utilizing Tubigrip size D to try to help with edema control. Nonetheless if we can get everything approved as far as the arterial study is concerned and get this back quickly I may have did not want to wrap her I think that is probably can to be what is necessary to get this wound to heal appropriately. We will see patient back for reevaluation in 1 week here in the  clinic. If anything worsens or changes patient will contact our office for additional recommendations. Electronic Signature(s) Signed: 11/05/2023 10:05:54 AM By: Allen Derry PA-C Entered By: Allen Derry on 11/05/2023 10:05:54 -------------------------------------------------------------------------------- SuperBill Details Patient  Name: Date of Service: CO MBSMarney Doctor 11/05/2023 Medical Record Number: [id number] Patient Account Number: [id number] Date of Birth/Sex: Treating RN: [birth year] (82 y.o. [name] Other Clinician: Referring Provider: Treating Provider/Extender: [name] in Treatment: 2 Diagnosis Coding ICD-10 Codes Code Description [phone] Chronic venous hypertension (idiopathic) with ulcer and inflammation of left lower extremity L97.822 Non-pressure chronic ulcer of other part of left lower leg with fat layer exposed I73.89 Other specified peripheral vascular diseases M79.7 Fibromyalgia M17.0 Bilateral primary osteoarthritis of knee Facility Procedures : [facility], [place] Code: [id number] Haskell Riling ([id number]) Description: [phone] - WOUND CARE VISIT-LEV 2 EST PT [id number] Modifier: [id number] Pag Quantity: 1 e 7 of 7 Physician Procedures : CPT4 Code Description Modifier [phone] [id number] - WC PHYS LEVEL 4 - EST PT ICD-10 Diagnosis Description I87.332 Chronic venous hypertension (idiopathic) with ulcer and inflammation of left lower extremity L97.822 Non-pressure chronic ulcer of other part  of left lower leg with fat layer exposed I73.89 Other specified peripheral vascular diseases M79.7 Fibromyalgia Quantity: 1 Electronic Signature(s) Signed: 11/05/2023 1:26:48 PM By: Yevonne Pax RN Signed: 11/06/2023 7:54:12 PM By: Allen Derry PA-C Previous Signature: 11/05/2023 10:07:28 AM Version By: Allen Derry PA-C Entered By: Yevonne Pax on 11/05/2023 13:26:48

## 2023-11-11 ENCOUNTER — Encounter: Payer: PPO | Admitting: Physician Assistant

## 2023-11-11 DIAGNOSIS — L97822 Non-pressure chronic ulcer of other part of left lower leg with fat layer exposed: Secondary | ICD-10-CM | POA: Diagnosis not present

## 2023-11-11 DIAGNOSIS — I70248 Atherosclerosis of native arteries of left leg with ulceration of other part of lower left leg: Secondary | ICD-10-CM | POA: Diagnosis not present

## 2023-11-11 NOTE — Progress Notes (Addendum)
NEPPIE, BOYTER ([id number]) [id number] Page 1 of 7 Visit Report for 11/11/2023 Chief Complaint Document Details Patient Name: Date of Service: [name], [name] Doctor. 11/11/2023 12:30 PM Medical Record Number: [id number] Patient Account Number: [id number] Date of Birth/Sex: Treating RN: 28-Oct-1941 (82 y.o. [name] Other Clinician: Referring Provider: Treating Provider/Extender: Kari Baars in Treatment: 3 Information Obtained from: Patient Chief Complaint Left LE ulcer Electronic Signature(s) Signed: 11/11/2023 12:51:16 PM By: Allen Derry PA-C Entered By: Allen Derry on 11/11/2023 12:51:16 -------------------------------------------------------------------------------- Debridement Details Patient Name: Date of Service: [name], [name] [name] 11/11/2023 12:30 PM Medical Record Number: [id number] Patient Account Number: [id number] Date of Birth/Sex: Treating RN: Oct 03, 1941 (82 y.o. [name] Other Clinician: Referring Provider: Treating Provider/Extender: Kari Baars in Treatment: 3 Debridement Performed for Assessment: Wound #3 Left Lower Leg Performed By: Physician Allen Derry, PA-C The following information was scribed by: Yevonne Pax The information was scribed for: Allen Derry Debridement Type: Debridement Severity of Tissue Pre Debridement: Fat layer exposed Level of Consciousness (Pre-procedure): Awake and Alert Pre-procedure Verification/Time Out Yes - 13:17 Taken: Percent of Wound Bed Debrided: 100% T Area Debrided (cm): otal 0.82 Tissue and other material debrided: Viable, Non-Viable, Slough, Subcutaneous, Skin: Dermis , Skin: Epidermis, Slough Level: Skin/Subcutaneous Tissue Debridement Description: Excisional Instrument: Curette Bleeding: Minimum Hemostasis Achieved: Pressure End Time: 13:20 DEMITRI, SMOLAR ([id number]) [id number] Page 2 of 7 Procedural Pain: 0 Post Procedural Pain: 0 Response to Treatment: Procedure was tolerated well Level of Consciousness (Post- Awake and Alert procedure): Post Debridement Measurements of Total Wound Length: (cm) 1.3 Width: (cm) 0.8 Depth: (cm) 0.7 Volume: (cm) 0.572 Character of Wound/Ulcer Post Debridement: Improved Severity of Tissue Post Debridement: Fat layer exposed Post Procedure Diagnosis Same as Pre-procedure Electronic Signature(s) Signed: 11/11/2023 1:43:25 PM By: Yevonne Pax RN Signed: 11/11/2023 4:20:01 PM By: Allen Derry PA-C Entered By: Yevonne Pax on 11/11/2023 13:43:25 -------------------------------------------------------------------------------- HPI Details Patient Name: Date of Service: [name], [name] [name] 11/11/2023 12:30 PM Medical Record Number: [id number] Patient Account Number: [id number] Date of Birth/Sex: Treating RN: [birth year] (82 y.o. [name] Other Clinician: Referring Provider: Treating Provider/Extender: Kari Baars in Treatment: 3 History of Present Illness HPI Description: Readmission: 10-18-2023 upon evaluation today patient appears to be doing poorly currently in regard to a wound over the left lower leg. This is something that apparently occurred as a result of the box falling out of the car when she open the back door that raised up onto her leg this occurred back in the summertime some around July or August they believe possibly July. Nonetheless this is a patient that I have seen previously although the last time I saw her personally was in 2023 although I think she did see Dr. Mikey Bussing earlier this year. Generally they are due to injuries. With that being said I do not have a specific finding as far as her blood flow is concerned we have several venous studies but not arterial I am getting up  sending her for an arterial evaluation at this point since this has not been healing despite her using antibiotic ointment and taking care of this daily I think that there may be something else going on here from a arterial flow's perspective. With that being said we definitely need to initiate treatment with some specific measures to try to get this cleaner. Patient  does have a history of bilateral knee osteoarthritis, fibromyalgia, and what appears to be peripheral vascular disease although we know she definitely has peripheral venous disease. 10-28-2023 upon evaluation today patient appears to be doing well currently in regard to her wound. This is a little bit smaller though still has some depth I think that we are still have a little bit a ways to go to get this to completely heal obviously. Fortunately I do not see any signs of active infection locally or systemically which is good news. No fevers, chills, nausea, vomiting, or diarrhea. 11-05-2023 upon evaluation today patient unfortunately continues to have some issues here with her wound and I feel like it is slowly turning around. With that being said it is a little bit too slow for my liking and I would like to see about getting her tested today for blood flow. 11-11-2023 upon evaluation patient appears to be doing better in regard to her wound. I do feel like that she is making some good progress here and I am very pleased in that regard. Fortunately I do not see any signs of active infection which is good news and in general I think there were making headway here towards closure. Electronic Signature(s) Signed: 11/11/2023 2:33:09 PM By: Allen Derry PA-C Entered By: Allen Derry on 11/11/2023 14:33:09 Willits, [name] ([id number]) [id number] Page 3 of 7 -------------------------------------------------------------------------------- Physical Exam Details Patient Name: Date of Service: [name], [name] [name]  11/11/2023 12:30 PM Medical Record Number: [id number] Patient Account Number: [id number] Date of Birth/Sex: Treating RN: 1941/01/21 (82 y.o. [name] Other Clinician: Referring Provider: Treating Provider/Extender: [name] in Treatment: 3 Constitutional Well-nourished and well-hydrated in no acute distress. Respiratory normal breathing without difficulty. Psychiatric this patient is able to make decisions and demonstrates good insight into disease process. Alert and Oriented x 3. pleasant and cooperative. Notes Upon inspection patient's wound bed showed signs of good granulation epithelization at this point. Fortunately I do not see any evidence of active infection or worsening at this time which is great news and overall I do believe that we are making excellent headway towards closure. Electronic Signature(s) Signed: 11/11/2023 2:33:22 PM By: Allen Derry PA-C Entered By: Allen Derry on 11/11/2023 14:33:22 -------------------------------------------------------------------------------- Physician Orders Details Patient Name: Date of Service: [name], [name] [name] 11/11/2023 12:30 PM Medical Record Number: [id number] Patient Account Number: [id number] Date of Birth/Sex: Treating RN: [birth year] (82 y.o. 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 Bathing/ Shower/ Hygiene May shower with wound dressing protected with water repellent cover or cast protector. Edema Control - Orders / Instructions Elevate, Exercise Daily and A void Standing for Long Periods of Time. Elevate legs to the level of the heart and pump ankles as often as possible Elevate leg(s) parallel to the floor when sitting. Wound Treatment Wound #3 - Lower Leg Wound Laterality: Left Cleanser:  Soap and Water 1 x Per Day/30 Days Discharge Instructions: Gently cleanse wound with antibacterial soap, rinse and pat dry prior to dressing wounds Peri-Wound Care: Triamcinolone Acetonide Cream, 0.1%, 15 (g) tube 1 x Per Day/30 Days Discharge Instructions: Apply as directed. Prim Dressing: Promogran Matrix 4.34 (in) 1 x Per Day/30 Days ary Discharge Instructions: Moisten w/normal saline or sterile water; Cover wound as directed. Do not remove from wound bed. Secondary Dressing: ABD Pad 5x9 (in/in) 1 x Per Day/30 Days Discharge Instructions: Cover with ABD pad Secured With: Medipore T - 71M Medipore H Soft Cloth Surgical T ape ape, 2x2 (in/yd) 1 x Per Day/30 Days Electronic Signature(s) Signed: 11/11/2023 3:34:19 PM By: Yevonne Pax RN Signed: 11/11/2023 4:20:01 PM By: Allen Derry PA-C Entered By: Yevonne Pax on 11/11/2023 13:29:10 -------------------------------------------------------------------------------- Problem List Details Patient Name: Date of Service: [name], [name] [name] 11/11/2023 12:30 PM Medical Record Number: [id number] Patient Account Number: [id number] Date of Birth/Sex: Treating RN: 11/28/1940 (82 y.o. [name] Other Clinician: Referring Provider: Treating Provider/Extender: Kari Baars in Treatment: 3 Active  Problems ICD-10 Encounter Code Description Active Date MDM Diagnosis I87.332 Chronic venous hypertension (idiopathic) with ulcer and inflammation of left 10/18/2023 No Yes lower extremity L97.822 Non-pressure chronic ulcer of other part of left lower leg with fat layer 10/18/2023 No Yes exposed I73.89 Other specified peripheral vascular diseases 10/18/2023 No Yes Bucks, Naveen L ([id number]) [id number] Page 5 of 7 M79.7 Fibromyalgia 10/18/2023 No Yes M17.0 Bilateral primary osteoarthritis of knee 10/18/2023 No Yes Inactive Problems Resolved Problems Electronic Signature(s) Signed: 11/11/2023 12:51:13 PM By: Allen Derry PA-C Entered By: Allen Derry on 11/11/2023 12:51:13 -------------------------------------------------------------------------------- Progress Note Details Patient Name: Date of Service: [name], [name] [name] 11/11/2023 12:30 PM Medical Record Number: [id number] Patient Account Number: [id number] Date of Birth/Sex: Treating RN: Apr 17, 1941 (82 y.o. [name] Other Clinician: Referring Provider: Treating Provider/Extender: Kari Baars in Treatment: 3 Subjective Chief Complaint Information obtained from Patient Left LE ulcer History of Present Illness (HPI) Readmission: 10-18-2023 upon evaluation today patient appears to be doing poorly currently in regard to a wound over the left lower leg. This is something that apparently occurred as a result of the box falling out of the car when she open the back door that raised up onto her leg this occurred back in the summertime some around July or August they believe possibly July. Nonetheless this is a patient that I have seen previously although the last time I saw her personally was in 2023 although I think she did see Dr. Mikey Bussing earlier this year. Generally they are due to injuries. With that being said I do not have a  specific finding as far as her blood flow is concerned we have several venous studies but not arterial I am getting up sending her for an arterial evaluation at this point since this has not been healing despite her using antibiotic ointment and taking care of this daily I think that there may be something else going on here from a arterial flow's perspective. With that being said we definitely need to initiate treatment with some specific measures to try to get this cleaner. Patient does have a history of bilateral knee osteoarthritis, fibromyalgia, and what appears to be peripheral vascular disease although we know she definitely has peripheral venous disease. 10-28-2023 upon evaluation today patient appears to be doing well currently in regard to her wound.  This is a little bit smaller though still has some depth I think that we are still have a little bit a ways to go to get this to completely heal obviously. Fortunately I do not see any signs of active infection locally or systemically which is good news. No fevers, chills, nausea, vomiting, or diarrhea. 11-05-2023 upon evaluation today patient unfortunately continues to have some issues here with her wound and I feel like it is slowly turning around. With that being said it is a little bit too slow for my liking and I would like to see about getting her tested today for blood flow. 11-11-2023 upon evaluation patient appears to be doing better in regard to her wound. I do feel like that she is making some good progress here and I am very pleased in that regard. Fortunately I do not see any signs of active infection which is good news and in general I think there were making headway here towards closure. Objective LIANAH, CORNMAN ([id number]) [id number] Page 6 of 7 Constitutional Well-nourished and well-hydrated in no acute distress. Vitals Time Taken: 12:52 PM, Height: 64 in, Weight: 187 lbs, BMI: 32.1,  Temperature: 97.7 F, Pulse: 75 bpm, Respiratory Rate: 18 breaths/min, Blood Pressure: 187/92 mmHg. Respiratory normal breathing without difficulty. Psychiatric this patient is able to make decisions and demonstrates good insight into disease process. Alert and Oriented x 3. pleasant and cooperative. General Notes: Upon inspection patient's wound bed showed signs of good granulation epithelization at this point. Fortunately I do not see any evidence of active infection or worsening at this time which is great news and overall I do believe that we are making excellent headway towards closure. Integumentary (Hair, Skin) Wound #3 status is Open. Original cause of wound was Trauma. The date acquired was: 06/19/2023. The wound has been in treatment 3 weeks. The wound is located on the Left Lower Leg. The wound measures 1.3cm length x 0.8cm width x 0.7cm depth; 0.817cm^2 area and 0.572cm^3 volume. There is Fat Layer (Subcutaneous Tissue) exposed. There is no tunneling or undermining noted. There is a medium amount of serosanguineous drainage noted. There is small (1- 33%) pink granulation within the wound bed. There is a large (67-100%) amount of necrotic tissue within the wound bed including Adherent Slough. 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 There was a Excisional Skin/Subcutaneous Tissue Debridement with a total area of 0.82 sq cm performed by Allen Derry, PA-C. With the following instrument(s): Curette to remove Viable and Non-Viable tissue/material.  Material removed includes Subcutaneous Tissue, Slough, Skin: Dermis, and Skin: Epidermis. No specimens were taken. A time out was conducted at 13:17, prior to the start of the procedure. A Minimum amount of bleeding was controlled with Pressure. The procedure was tolerated well with a pain level of 0 throughout and a pain level of 0 following the procedure. Post Debridement Measurements: 1.3cm length x 0.8cm width x 0.7cm depth; 0.572cm^3 volume. Character of Wound/Ulcer Post Debridement is improved. Severity of Tissue Post Debridement is: Fat layer exposed. Post procedure Diagnosis Wound #3: Same as Pre-Procedure Plan Follow-up Appointments: Return Appointment in 1 week. Bathing/ Shower/ Hygiene:  May shower with wound dressing protected with water repellent cover or cast protector. Edema Control - Orders / Instructions: Elevate, Exercise Daily and Avoid Standing for Long Periods of Time. Elevate legs to the level of the heart and pump ankles as often as possible Elevate leg(s) parallel to the floor when sitting. WOUND #3: - Lower Leg Wound Laterality: Left Cleanser: Soap and Water 1 x Per Day/30 Days Discharge Instructions: Gently cleanse wound with antibacterial soap, rinse and pat dry prior to dressing wounds Peri-Wound Care: Triamcinolone Acetonide Cream, 0.1%, 15 (g) tube 1 x Per Day/30 Days Discharge Instructions: Apply as directed. Prim Dressing: Promogran Matrix 4.34 (in) 1 x Per Day/30 Days ary Discharge Instructions: Moisten w/normal saline or sterile water; Cover wound as directed. Do not remove from wound bed. Secondary Dressing: ABD Pad 5x9 (in/in) 1 x Per Day/30 Days Discharge Instructions: Cover with ABD pad Secured With: Medipore T - 78M Medipore H Soft Cloth Surgical T ape ape, 2x2 (in/yd) 1 x Per Day/30 Days 1. Based on what I am seeing I am going to recommend that we have the patient go ahead and continue to dress this regularly though I am going to suggest that we  utilize a collagen based dressing I think that is can be the best thing at the moment. 2. I am good recommend as well that we have the patient continue with the triamcinolone to the leg. 3. I am going to recommend that we have the patient going continue to monitor for any signs of infection she should keep taking the Levaquin I think this is [name], [name] ([id number]) [id number] Page 7 of 7 necessary she also needs to be eating regularly as well however. We had a conversation about that today. We will see patient back for reevaluation in 1 week here in the clinic. If anything worsens or changes patient will contact our office for additional recommendations. Electronic Signature(s) Signed: 11/11/2023 2:34:13 PM By: Allen Derry PA-C Entered By: Allen Derry on 11/11/2023 14:34:13 -------------------------------------------------------------------------------- SuperBill Details Patient Name: Date of Service: [name], [name] [name] 11/11/2023 Medical Record Number: [id number] Patient Account Number: [id number] Date of Birth/Sex: Treating RN: 02/05/1941 (82 y.o. 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 I73.89 Other specified peripheral vascular diseases M79.7 Fibromyalgia M17.0 Bilateral primary osteoarthritis of knee Facility Procedures : CPT4 Code: [id number] Description: 11042 - DEB SUBQ TISSUE 20 SQ CM/< ICD-10 Diagnosis Description L97.822 Non-pressure chronic ulcer of other part of left lower leg with fat layer expos Modifier: ed Quantity: 1 Physician Procedures : CPT4 Code Description  Modifier 11042 11042 - WC PHYS SUBQ TISS 20 SQ CM ICD-10 Diagnosis Description L97.822 Non-pressure chronic ulcer of other part of left lower leg with fat layer exposed Quantity: 1 Electronic Signature(s) Signed: 11/11/2023 2:34:39 PM By: Allen Derry PA-C Previous Signature: 11/11/2023 1:40:01 PM Version By: Yevonne Pax RN Entered By: Allen Derry on 11/11/2023 14:34:39

## 2023-11-11 NOTE — Progress Notes (Addendum)
[name], [name] ([id number]) [id number] Page 1 of 9 Visit Report for 11/11/2023 Arrival Information Details Patient Name: Date of Service: [name], [name] 11/11/2023 12:30 PM Medical Record Number: [id number] Patient Account Number: [id number] Date of Birth/Sex: Treating RN: [birth year] (82 y.o. [name] Primary Care [name]: [name] Other Clinician: Referring Deem Marmol: Treating [name]/Extender: [name] in Treatment: 3 Visit Information History Since Last Visit Added or deleted any medications: No Patient Arrived: [name] Any new allergies or adverse reactions: No Arrival Time: 12:52 Had a fall or experienced change in No Accompanied By: daughter activities of daily living that may affect Transfer Assistance: None risk of falls: Patient Identification Verified: Yes Signs or symptoms of abuse/neglect since last visito No Secondary Verification Process Completed: Yes Hospitalized since last visit: No Patient Requires Transmission-Based Precautions: No Implantable device outside of the clinic excluding No Patient Has Alerts: Yes cellular tissue based products placed in the center Patient Alerts: NON compressible since last visit: Has Dressing in Place as Prescribed: Yes Pain Present Now: No Electronic Signature(s) Signed: 11/11/2023 3:34:19 PM By: [name] Pax RN Entered By: [name] on 11/11/2023 12:52:46 -------------------------------------------------------------------------------- Clinic Level of Care Assessment Details Patient Name: Date of Service: [name], [name]. 11/11/2023 12:30 PM Medical Record Number: [id number] Patient Account Number: [id number] Date of Birth/Sex: Treating RN: February 12, 1941 (82 y.o. [name] Primary Care Cevin Rubinstein: [name] Other Clinician: Referring Kerryn Tennant: Treating Lacrisha Bielicki/Extender: [name] in Treatment: 3 Clinic Level of  Care Assessment Items TOOL 1 Quantity Score []  - 0 Use when EandM and Procedure is performed on INITIAL visit ASSESSMENTS - Nursing Assessment / Reassessment []  - 0 General Physical Exam (combine w/ comprehensive assessment (listed just below) when performed on new pt. evals) []  - 0 Comprehensive Assessment (HX, ROS, Risk Assessments, Wounds Hx, etc.) [name], [name] ([id number]) [id number] Page 2 of 9 ASSESSMENTS - Wound and Skin Assessment / Reassessment []  - 0 Dermatologic / Skin Assessment (not related to wound area) ASSESSMENTS - Ostomy and/or Continence Assessment and Care []  - 0 Incontinence Assessment and Management []  - 0 Ostomy Care Assessment and Management (repouching, etc.) PROCESS - Coordination of Care []  - 0 Simple Patient / Family Education for ongoing care []  - 0 Complex (extensive) Patient / Family Education for ongoing care []  - 0 Staff obtains Chiropractor, Records, T Results / Process Orders est []  - 0 Staff telephones HHA, Nursing Homes / Clarify orders / etc []  - 0 Routine Transfer to another Facility (non-emergent condition) []  - 0 Routine Hospital Admission (non-emergent condition) []  - 0 New Admissions / Manufacturing engineer / Ordering NPWT Apligraf, etc. , []  - 0 Emergency Hospital Admission (emergent condition) PROCESS - Special Needs []  - 0 Pediatric / Minor Patient Management []  - 0 Isolation Patient Management []  - 0 Hearing / Language / Visual special needs []  - 0 Assessment of Community assistance (transportation, D/C planning, etc.) []  - 0 Additional assistance / Altered mentation []  - 0 Support Surface(s) Assessment (bed, cushion, seat, etc.) INTERVENTIONS - Miscellaneous []  - 0 External ear exam []  - 0 Patient Transfer (multiple staff / Nurse, adult / Similar devices) []  - 0 Simple Staple / Suture removal (25 or less) []  - 0 Complex Staple / Suture removal (26 or more) []  - 0 Hypo/Hyperglycemic  Management (do not check if billed separately) []  - 0 Ankle / Brachial Index (ABI) - do not check if billed separately Has the patient been seen at  the hospital within the last three years: Yes Total Score: 0 Level Of Care: ____ Electronic Signature(s) Signed: 11/11/2023 3:34:19 PM By: [name] Pax RN Entered By: [name] on 11/11/2023 13:43:43 -------------------------------------------------------------------------------- Encounter Discharge Information Details Patient Name: Date of Service: [name], [name] 11/11/2023 12:30 PM Medical Record Number: [id number] Patient Account Number: [id number] Date of Birth/Sex: Treating RN: [birth year] (82 y.o. [name] Primary Care Foster Sonnier: [name] Other Clinician: Referring Yaritzy Huser: Treating Maizee Reinhold/Extender: [name] in Treatment: 3 [name] ([id number]) [id number] Page 3 of 9 Encounter Discharge Information Items Discharge Condition: Stable Ambulatory Status: Walker Discharge Destination: Home Transportation: Private Auto Accompanied By: daughter Schedule Follow-up Appointment: Yes Clinical Summary of Care: Electronic Signature(s) Signed: 11/11/2023 1:41:50 PM By: [name] Pax RN Entered By: [name] on 11/11/2023 13:41:50 -------------------------------------------------------------------------------- Lower Extremity Assessment Details Patient Name: Date of Service: [name], [name]. 11/11/2023 12:30 PM Medical Record Number: [id number] Patient Account Number: [id number] Date of Birth/Sex: Treating RN: 28-Sep-1941 (82 y.o. [name] Primary Care Netanya Yazdani: [name] Other Clinician: Referring Kawehi Hostetter: Treating Silvano Garofano/Extender: [name] in Treatment: 3 Edema Assessment Assessed: [Left: No] [Right: No] [Left: Edema] [Right: :] Calf Left: Right: Point of Measurement: 32 cm From Medial Instep 44  cm Ankle Left: Right: Point of Measurement: 10 cm From Medial Instep 29 cm Knee To Floor Left: Right: From Medial Instep 43 cm Vascular Assessment Pulses: Dorsalis Pedis Palpable: [Left:Yes] Extremity colors, hair growth, and conditions: Extremity Color: [Left:Hyperpigmented] Hair Growth on Extremity: [Left:No] Temperature of Extremity: [Left:Warm < 3 seconds] Toe Nail Assessment Left: Right: Thick: Yes Discolored: Yes Deformed: Yes Improper Length and Hygiene: Yes THU, TENAGLIA ([id number]) [id number] Page 4 of 9 Electronic Signature(s) Signed: 11/11/2023 3:34:19 PM By: [name] Pax RN Entered By: [name] on 11/11/2023 13:01:28 -------------------------------------------------------------------------------- Multi Wound Chart Details Patient Name: Date of Service: [name], [name] 11/11/2023 12:30 PM Medical Record Number: [id number] Patient Account Number: [id number] Date of Birth/Sex: Treating RN: 06-27-1941 (82 y.o. [name] Primary Care Brockton Mckesson: [name] Other Clinician: Referring Daurice Ovando: Treating Roemello Speyer/Extender: [name] in Treatment: 3 Vital Signs Height(in): 64 Pulse(bpm): 75 Weight(lbs): 187 Blood Pressure(mmHg): 187/92 Body Mass Index(BMI): 32.1 Temperature(F): 97.7 Respiratory Rate(breaths/min): 18 [3:Photos:] [N/A:N/A] Left Lower Leg N/A N/A Wound Location: Trauma N/A N/A Wounding Event: Arterial Insufficiency Ulcer N/A N/A Primary Etiology: Cataracts, Osteoarthritis, Received N/A N/A Comorbid History: Radiation 06/19/2023 N/A N/A Date Acquired: 3 N/A N/A Weeks of Treatment: Open N/A N/A Wound Status: No N/A N/A Wound Recurrence: 1.3x0.8x0.7 N/A N/A Measurements [name] x W x D (cm) 0.817 N/A N/A A (cm) : rea 0.572 N/A N/A Volume (cm) : 1.00% N/A N/A % Reduction in Area: 0.90% N/A N/A % Reduction in Volume: Full Thickness Without Exposed N/A  N/A Classification: Support Structures Medium N/A N/A Exudate Amount: Serosanguineous N/A N/A Exudate Type: red, brown N/A N/A Exudate Color: Small (1-33%) N/A N/A Granulation Amount: Pink N/A N/A Granulation Quality: Large (67-100%) N/A N/A Necrotic Amount: Fat Layer (Subcutaneous Tissue): Yes N/A N/A Exposed Structures: Fascia: No Tendon: No Muscle: No Joint: No Bone: No None N/A N/A Epithelialization: Treatment Notes Mcnicholas, Zamyah [name] ([id number]) [id number] Page 5 of 9 Electronic Signature(s) Signed: 11/11/2023 3:34:19 PM By: [name] Pax RN Entered By: [name] on 11/11/2023 13:01:33 -------------------------------------------------------------------------------- Multi-Disciplinary Care Plan Details Patient Name: Date of Service: [name], [name] 11/11/2023 12:30 PM Medical Record Number: [id number] Patient Account Number: [id number] Date of  Birth/Sex: Treating RN: February 10, 1941 (82 y.o. [name] Primary Care Clarissia Mckeen: [name] Other Clinician: Referring Eartha Vonbehren: Treating Azavier Creson/Extender: [name] in Treatment: 3 Active Inactive Wound/Skin Impairment Nursing Diagnoses: Knowledge deficit related to ulceration/compromised skin integrity Goals: Patient/caregiver will verbalize understanding of skin care regimen Date Initiated: 10/18/2023 Target Resolution Date: 11/17/2023 Goal Status: Active Ulcer/skin breakdown will have a volume reduction of 30% by week 4 Date Initiated: 10/18/2023 Target Resolution Date: 11/17/2023 Goal Status: Active Ulcer/skin breakdown will have a volume reduction of 50% by week 8 Date Initiated: 10/18/2023 Target Resolution Date: 12/18/2023 Goal Status: Active Ulcer/skin breakdown will have a volume reduction of 80% by week 12 Date Initiated: 10/18/2023 Target Resolution Date: 01/18/2024 Goal Status: Active Ulcer/skin breakdown will heal within 14 weeks Date  Initiated: 10/18/2023 Target Resolution Date: 02/15/2024 Goal Status: Active Interventions: Assess patient/caregiver ability to obtain necessary supplies Assess patient/caregiver ability to perform ulcer/skin care regimen upon admission and as needed Assess ulceration(s) every visit Notes: Electronic Signature(s) Signed: 11/11/2023 3:34:19 PM By: [name] Pax RN Entered By: [name] on 11/11/2023 13:01:43 Siever, [name] ([id number]) [id number] Page 6 of 9 -------------------------------------------------------------------------------- Pain Assessment Details Patient Name: Date of Service: [name], [name] 11/11/2023 12:30 PM Medical Record Number: [id number] Patient Account Number: [id number] Date of Birth/Sex: Treating RN: [birth year] (82 y.o. [name] Primary Care Latishia Suitt: [name] Other Clinician: Referring Abrian Hanover: Treating Elvan Ebron/Extender: [name] in Treatment: 3 Active Problems Location of Pain Severity and Description of Pain Patient Has Paino No Site Locations Pain Management and Medication Current Pain Management: Electronic Signature(s) Signed: 11/11/2023 3:34:19 PM By: [name] Pax RN Entered By: [name] on 11/11/2023 12:53:27 -------------------------------------------------------------------------------- Patient/Caregiver Education Details Patient Name: Date of Service: [name], [name] Philis Pique 12/16/2024andnbsp12:30 PM Medical Record Number: [id number] Patient Account Number: [id number] Date of Birth/Gender: Treating RN: 1941-11-20 (82 y.o. [name] Primary Care Physician: [name] Other Clinician: Referring Physician: Treating Physician/Extender: [name] in Treatment: 3 Wiles, [name] ([id number]) [phone].pdf Page 7 of 9 Education Assessment Education Provided To: Patient Education Topics Provided Wound/Skin  Impairment: Handouts: Caring for Your Ulcer Methods: Explain/Verbal Responses: State content correctly Electronic Signature(s) Signed: 11/11/2023 3:34:19 PM By: [name] Pax RN Entered By: [name] on 11/11/2023 13:02:08 -------------------------------------------------------------------------------- Wound Assessment Details Patient Name: Date of Service: [name], [name] 11/11/2023 12:30 PM Medical Record Number: [id number] Patient Account Number: [id number] Date of Birth/Sex: Treating RN: [birth year] (82 y.o. [name] Primary Care Claudina Oliphant: [name] Other Clinician: Referring Dewey Neukam: Treating Nelida Mandarino/Extender: [name] in Treatment: 3 Wound Status Wound Number: 3 Primary Etiology: Arterial Insufficiency Ulcer Wound Location: Left Lower Leg Wound Status: Open Wounding Event: Trauma Comorbid History: Cataracts, Osteoarthritis, Received Radiation Date Acquired: 06/19/2023 Weeks Of Treatment: 3 Clustered Wound: No Photos Wound Measurements Length: (cm) 1.3 Width: (cm) 0.8 Depth: (cm) 0.7 Area: (cm) 0.817 Volume: (cm) 0.572 % Reduction in Area: 1% % Reduction in Volume: 0.9% Epithelialization: None Tunneling: No Undermining: No Wound Description Classification: Full Thickness Without Exposed Support Exudate Amount: Medium Exudate Type: Serosanguineous Keetch, Rishika [name] ([id number]) Exudate Color: red, brown Structures Foul Odor After Cleansing: No Slough/Fibrino Yes [id number] Page 8 of 9 Wound Bed Granulation Amount: Small (1-33%) Exposed Structure Granulation Quality: Pink Fascia Exposed: No Necrotic Amount: Large (67-100%) Fat Layer (Subcutaneous Tissue) Exposed: Yes Necrotic Quality: Adherent Slough Tendon Exposed: No Muscle Exposed: No Joint Exposed: No Bone Exposed: No Treatment Notes Wound #3 (Lower  Leg) Wound Laterality: Left Cleanser Soap and Water Discharge Instruction:  Gently cleanse wound with antibacterial soap, rinse and pat dry prior to dressing wounds Peri-Wound Care Triamcinolone Acetonide Cream, 0.1%, 15 (g) tube Discharge Instruction: Apply as directed. Topical Primary Dressing Promogran Matrix 4.34 (in) Discharge Instruction: Moisten w/normal saline or sterile water; Cover wound as directed. Do not remove from wound bed. Secondary Dressing ABD Pad 5x9 (in/in) Discharge Instruction: Cover with ABD pad Secured With Medipore T - 94M Medipore H Soft Cloth Surgical T ape ape, 2x2 (in/yd) Compression Wrap Compression Stockings Add-Ons Electronic Signature(s) Signed: 11/11/2023 3:34:19 PM By: [name] Pax RN Entered By: [name] on 11/11/2023 12:59:41 -------------------------------------------------------------------------------- Vitals Details Patient Name: Date of Service: [name], [name] 11/11/2023 12:30 PM Medical Record Number: [id number] Patient Account Number: [id number] Date of Birth/Sex: Treating RN: Oct 08, 1941 (82 y.o. [name] Primary Care Kambry Takacs: [name] Other Clinician: Referring Velvet Moomaw: Treating Aaren Atallah/Extender: [name] in Treatment: 3 Vital Signs Time Taken: 12:52 Temperature (F): 97.7 Height (in): 64 Pulse (bpm): 75 Weight (lbs): 187 Respiratory Rate (breaths/min): 18 Body Mass Index (BMI): 32.1 Blood Pressure (mmHg): 187/92 Reference Range: 80 - 120 mg / dl Pavlich, Prisila [name] ([id number]) [id number] Page 9 of 9 Electronic Signature(s) Signed: 11/11/2023 3:34:19 PM By: [name] Pax RN Entered By: [name] on 11/11/2023 12:53:14

## 2023-11-18 ENCOUNTER — Encounter: Payer: PPO | Admitting: Physician Assistant

## 2023-11-18 DIAGNOSIS — I70248 Atherosclerosis of native arteries of left leg with ulceration of other part of lower left leg: Secondary | ICD-10-CM | POA: Diagnosis not present

## 2023-11-18 DIAGNOSIS — L97822 Non-pressure chronic ulcer of other part of left lower leg with fat layer exposed: Secondary | ICD-10-CM | POA: Diagnosis not present

## 2023-11-18 NOTE — Progress Notes (Signed)
MANEKA, BALTZELL ([id number]) [id number] Page 1 of 3 Visit Report for 11/18/2023 Chief Complaint Document Details Patient Name: Date of Service: [name], ELIZA BETH L. 11/18/2023 8:45 A M Medical Record Number: [id number] Patient Account Number: [id number] Date of Birth/Sex: Treating RN: [birth year] (82 y.o. [name] Other Clinician: Referring Provider: Treating Provider/Extender: Kari Baars in Treatment: 4 Information Obtained from: Patient Chief Complaint Left LE ulcer Electronic Signature(s) Signed: 11/18/2023 9:08:41 AM By: Allen Derry PA-C Entered By: Allen Derry on 11/18/2023 09:08:41 -------------------------------------------------------------------------------- Physician Orders Details Patient Name: Date of Service: [name], ELIZA BETH L. 11/18/2023 8:45 A M Medical Record Number: [id number] Patient Account Number: [id number] Date of Birth/Sex: Treating RN: 1941/05/05 (82 y.o. 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 [address] [place], [place] L ([id number]) [id number] Page 2 of 3 May shower with wound dressing protected with water repellent cover or cast protector. Edema Control - Orders / Instructions Tubigrip single layer applied. - size D Elevate, Exercise Daily and A void  Standing for Long Periods of Time. Elevate legs to the level of the heart and pump ankles as often as possible Elevate leg(s) parallel to the floor when sitting. Wound Treatment Wound #3 - Lower Leg Wound Laterality: Left Cleanser: Soap and Water 1 x Per Day/30 Days Discharge Instructions: Gently cleanse wound with antibacterial soap, rinse and pat dry prior to dressing wounds Peri-Wound Care: Triamcinolone Acetonide Cream, 0.1%, 15 (g) tube 1 x Per Day/30 Days Discharge Instructions: Apply as directed. Prim Dressing: Promogran Matrix 4.34 (in) 1 x Per Day/30 Days ary Discharge Instructions: Moisten w/normal saline or sterile water; Cover wound as directed. Do not remove from wound bed. Secondary Dressing: ABD Pad 5x9 (in/in) 1 x Per Day/30 Days Discharge Instructions: Cover with ABD pad Secured With: Medipore T - 12M Medipore H Soft Cloth Surgical T ape ape, 2x2 (in/yd) 1 x Per Day/30 Days Electronic Signature(s) Unsigned Entered ByYevonne Pax on 11/18/2023 09:16:35 -------------------------------------------------------------------------------- Problem List Details Patient Name: Date of Service: [name], ELIZA BETH L. 11/18/2023 8:45 A M Medical Record Number: [id number] Patient Account Number: [id number] Date of Birth/Sex: Treating RN: 04-May-1941 (82 y.o. [name] Other Clinician: Referring Provider: Treating Provider/Extender: Kari Baars in Treatment: 4 Active Problems ICD-10 Encounter Code Description Active Date MDM Diagnosis I87.332 Chronic venous hypertension (idiopathic) with ulcer and inflammation 10/18/2023 No Yes of left lower extremity L97.822 Non-pressure chronic ulcer of other part of left lower leg with fat layer 10/18/2023 No Yes exposed I73.89 Other specified peripheral vascular diseases 10/18/2023 No Yes M79.7 Fibromyalgia 10/18/2023 No Yes Cummiskey, Ardath L ([id number])  [id number] Page 3 of 3 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

## 2023-11-22 ENCOUNTER — Encounter (INDEPENDENT_AMBULATORY_CARE_PROVIDER_SITE_OTHER): Payer: PPO

## 2023-12-02 ENCOUNTER — Ambulatory Visit: Payer: PPO | Admitting: Physician Assistant

## 2023-12-09 ENCOUNTER — Encounter: Payer: PPO | Attending: Physician Assistant | Admitting: Physician Assistant

## 2023-12-09 DIAGNOSIS — M797 Fibromyalgia: Secondary | ICD-10-CM | POA: Insufficient documentation

## 2023-12-09 DIAGNOSIS — I87332 Chronic venous hypertension (idiopathic) with ulcer and inflammation of left lower extremity: Secondary | ICD-10-CM | POA: Diagnosis not present

## 2023-12-09 DIAGNOSIS — M170 Bilateral primary osteoarthritis of knee: Secondary | ICD-10-CM | POA: Insufficient documentation

## 2023-12-09 DIAGNOSIS — L97822 Non-pressure chronic ulcer of other part of left lower leg with fat layer exposed: Secondary | ICD-10-CM | POA: Diagnosis not present

## 2023-12-09 DIAGNOSIS — I70248 Atherosclerosis of native arteries of left leg with ulceration of other part of lower left leg: Secondary | ICD-10-CM | POA: Diagnosis not present

## 2023-12-11 NOTE — Progress Notes (Signed)
 [name], [name] ([id number]) [id number] Page 1 of 9 Visit Report for 12/09/2023 Arrival Information Details Patient Name: Date of Service: [name], [name] 12/09/2023 12:30 PM Medical Record Number: [id number] Patient Account Number: [id number] Date of Birth/Sex: Treating [name]: [birth year] (83 y.o. [name] Primary Care [name]: [name] Other Clinician: Referring [name]: Treating [name]/Extender: [name] in Treatment: 7 Visit Information History Since Last Visit Added or deleted any medications: No Patient Arrived: [name] Any new allergies or adverse reactions: No Arrival Time: 12:39 Had a fall or experienced change in No Accompanied By: daughter activities of daily living that may affect Transfer Assistance: None risk of falls: Patient Identification Verified: Yes Signs or symptoms of abuse/neglect since last visito No Secondary Verification Process Completed: Yes Hospitalized since last visit: No Patient Requires Transmission-Based Precautions: No Implantable device outside of the clinic excluding No Patient Has Alerts: Yes cellular tissue based products placed in the center Patient Alerts: NON compressible since last visit: Has Dressing in Place as Prescribed: Yes Pain Present Now: No Electronic Signature(s) Signed: 12/09/2023 4:05:20 PM By: [name] Entered By: [name] on 12/09/2023 12:40:10 -------------------------------------------------------------------------------- Clinic Level of Care Assessment Details Patient Name: Date of Service: [name], [name] [name]. 12/09/2023 12:30 PM Medical Record Number: [id number] Patient Account Number: [id number] Date of Birth/Sex: Treating [name]: 30-Dec-1940 (83 y.o. [name] Primary Care [name]: [name] Other Clinician: Referring [name]: Treating [name]/Extender: [name] in Treatment: 7 Clinic Level of  Care Assessment Items TOOL 4 Quantity Score X- 1 0 Use when only an EandM is performed on FOLLOW-UP visit ASSESSMENTS - Nursing Assessment / Reassessment X- 1 10 Reassessment of Co-morbidities (includes updates in patient status) X- 1 5 Reassessment of Adherence to Treatment Plan [name], [name] ([id number]) [phone] Page 2 of 9 ASSESSMENTS - Wound and Skin A ssessment / Reassessment X - Simple Wound Assessment / Reassessment - one wound 1 5 []  - 0 Complex Wound Assessment / Reassessment - multiple wounds []  - 0 Dermatologic / Skin Assessment (not related to wound area) ASSESSMENTS - Focused Assessment []  - 0 Circumferential Edema Measurements - multi extremities []  - 0 Nutritional Assessment / Counseling / Intervention []  - 0 Lower Extremity Assessment (monofilament, tuning fork, pulses) []  - 0 Peripheral Arterial Disease Assessment (using hand held doppler) ASSESSMENTS - Ostomy and/or Continence Assessment and Care []  - 0 Incontinence Assessment and Management []  - 0 Ostomy Care Assessment and Management (repouching, etc.) PROCESS - Coordination of Care X - Simple Patient / Family Education for ongoing care 1 15 []  - 0 Complex (extensive) Patient / Family Education for ongoing care []  - 0 Staff obtains Chiropractor, Records, T Results / Process Orders est []  - 0 Staff telephones HHA, Nursing Homes / Clarify orders / etc []  - 0 Routine Transfer to another Facility (non-emergent condition) []  - 0 Routine Hospital Admission (non-emergent condition) []  - 0 New Admissions / Manufacturing Engineer / Ordering NPWT Apligraf, etc. , []  - 0 Emergency Hospital Admission (emergent condition) X- 1 10 Simple Discharge Coordination []  - 0 Complex (extensive) Discharge Coordination PROCESS - Special Needs []  - 0 Pediatric / Minor Patient Management []  - 0 Isolation Patient Management []  - 0 Hearing / Language / Visual special needs []  -  0 Assessment of Community assistance (transportation, D/C planning, etc.) []  - 0 Additional assistance / Altered mentation []  - 0 Support Surface(s) Assessment (bed, cushion, seat, etc.) INTERVENTIONS - Wound Cleansing / Measurement  X - Simple Wound Cleansing - one wound 1 5 []  - 0 Complex Wound Cleansing - multiple wounds X- 1 5 Wound Imaging (photographs - any number of wounds) []  - 0 Wound Tracing (instead of photographs) X- 1 5 Simple Wound Measurement - one wound []  - 0 Complex Wound Measurement - multiple wounds INTERVENTIONS - Wound Dressings X - Small Wound Dressing one or multiple wounds 1 10 []  - 0 Medium Wound Dressing one or multiple wounds []  - 0 Large Wound Dressing one or multiple wounds []  - 0 Application of Medications - topical []  - 0 Application of Medications - injection INTERVENTIONS - Miscellaneous []  - 0 External ear exam [name], [name] ([id number]) [id number] Page 3 of 9 []  - 0 Specimen Collection (cultures, biopsies, blood, body fluids, etc.) []  - 0 Specimen(s) / Culture(s) sent or taken to Lab for analysis []  - 0 Patient Transfer (multiple staff / Deitra Lift / Similar devices) []  - 0 Simple Staple / Suture removal (25 or less) []  - 0 Complex Staple / Suture removal (26 or more) []  - 0 Hypo / Hyperglycemic Management (close monitor of Blood Glucose) []  - 0 Ankle / Brachial Index (ABI) - do not check if billed separately X- 1 5 Vital Signs Has the patient been seen at the hospital within the last three years: Yes Total Score: 75 Level Of Care: New/Established - Level 2 Electronic Signature(s) Signed: 12/09/2023 4:05:20 PM By: [name] Entered By: [name] on 12/09/2023 13:21:23 -------------------------------------------------------------------------------- Encounter Discharge Information Details Patient Name: Date of Service: [name], [name]. 12/09/2023 12:30 PM Medical Record Number:  [id number] Patient Account Number: [id number] Date of Birth/Sex: Treating [name]: [birth year] (83 y.o. [name] Primary Care [name]: [name] Other Clinician: Referring Jaquia Benedicto: Treating Pasha Broad/Extender: [name] in Treatment: 7 Encounter Discharge Information Items Discharge Condition: Stable Ambulatory Status: Walker Discharge Destination: Home Transportation: Private Auto Accompanied By: self Schedule Follow-up Appointment: Yes Clinical Summary of Care: Electronic Signature(s) Signed: 12/09/2023 1:22:38 PM By: [name] Entered By: [name] on 12/09/2023 13:22:37 -------------------------------------------------------------------------------- Lower Extremity Assessment Details Patient Name: Date of Service: [name], [name]. 12/09/2023 12:30 PM Marschner, Kynli [name] ([id number]) [id number] Page 4 of 9 Medical Record Number: [id number] Patient Account Number: [id number] Date of Birth/Sex: Treating [name]: 03-16-1941 (83 y.o. [name] Primary Care Chade Pitner: [name] Other Clinician: Referring Tamarah Bhullar: Treating Odalys Win/Extender: [name] Andre [name] Weeks in Treatment: 7 Edema Assessment Assessed: [Left: No] [Right: No] Edema: [Left: Ye] [Right: s] Calf Left: Right: Point of Measurement: 32 cm From Medial Instep 43 cm Ankle Left: Right: Point of Measurement: 10 cm From Medial Instep 29.5 cm Electronic Signature(s) Signed: 12/09/2023 4:05:20 PM By: [name] Entered By: [name] on 12/09/2023 12:51:27 -------------------------------------------------------------------------------- Multi Wound Chart Details Patient Name: Date of Service: [name], [name]. 12/09/2023 12:30 PM Medical Record Number: [id number] Patient Account Number: [id number] Date of Birth/Sex: Treating [name]: 20-Jun-1941 (83 y.o. [name] Primary Care Jayron Maqueda: [name] Other  Clinician: Referring Nysha Koplin: Treating Azlan Hanway/Extender: [name] Andre [name] Weeks in Treatment: 7 Vital Signs Height(in): 64 Pulse(bpm): 72 Weight(lbs): 187 Blood Pressure(mmHg): 205/83 Body Mass Index(BMI): 32.1 Temperature(F): 98.5 Respiratory Rate(breaths/min): 18 [3:Photos:] [N/A:N/A] Left Lower Leg N/A N/A Wound Location: Trauma N/A N/A Wounding Event: Arterial Insufficiency Ulcer N/A N/A Primary Etiology: Cataracts, Osteoarthritis, Received N/A N/A Comorbid History: Radiation 06/19/2023 N/A N/A Date Acquired: 7 N/A N/A Weeks of Treatment: Open N/A N/A Wound Status:  No N/A N/A Wound Recurrence: 1.5x1x0.7 N/A N/A Measurements [name] x W x D (cm) [name], [name] ([id number]) [phone].pdf Page 5 of 9 1.178 N/A N/A A (cm) : rea 0.825 N/A N/A Volume (cm) : -42.80% N/A N/A % Reduction in Area: -43.00% N/A N/A % Reduction in Volume: Full Thickness Without Exposed N/A N/A Classification: Support Structures Medium N/A N/A Exudate Amount: Serosanguineous N/A N/A Exudate Type: red, brown N/A N/A Exudate Color: Small (1-33%) N/A N/A Granulation Amount: Pink N/A N/A Granulation Quality: Large (67-100%) N/A N/A Necrotic Amount: Fat Layer (Subcutaneous Tissue): Yes N/A N/A Exposed Structures: Fascia: No Tendon: No Muscle: No Joint: No Bone: No None N/A N/A Epithelialization: Treatment Notes Electronic Signature(s) Signed: 12/09/2023 4:05:20 PM By: [name] Entered By: [name] on 12/09/2023 12:51:34 -------------------------------------------------------------------------------- Multi-Disciplinary Care Plan Details Patient Name: Date of Service: [name], [name]. 12/09/2023 12:30 PM Medical Record Number: [id number] Patient Account Number: [id number] Date of Birth/Sex: Treating [name]: 02-May-1941 (83 y.o. [name] Primary Care Cheyann Blecha: [name] Other Clinician: Referring Pricila Bridge: Treating  Toren Tucholski/Extender: [name] Andre [name] Weeks in Treatment: 7 Active Inactive Wound/Skin Impairment Nursing Diagnoses: Knowledge deficit related to ulceration/compromised skin integrity Goals: Patient/caregiver will verbalize understanding of skin care regimen Date Initiated: 10/18/2023 Target Resolution Date: 12/18/2023 Goal Status: Active Ulcer/skin breakdown will have a volume reduction of 30% by week 4 Date Initiated: 10/18/2023 Date Inactivated: 11/18/2023 Target Resolution Date: 11/17/2023 Goal Status: Met Ulcer/skin breakdown will have a volume reduction of 50% by week 8 Date Initiated: 10/18/2023 Target Resolution Date: 12/18/2023 Goal Status: Active Ulcer/skin breakdown will have a volume reduction of 80% by week 12 Date Initiated: 10/18/2023 Target Resolution Date: 01/18/2024 Goal Status: Active Ulcer/skin breakdown will heal within 14 weeks Date Initiated: 10/18/2023 Target Resolution Date: 02/15/2024 Goal Status: Active Interventions: Assess patient/caregiver ability to obtain necessary supplies [name], [name] ([id number]) [phone].pdf Page 6 of 9 Assess patient/caregiver ability to perform ulcer/skin care regimen upon admission and as needed Assess ulceration(s) every visit Notes: Electronic Signature(s) Signed: 12/09/2023 4:05:20 PM By: [name] Entered By: [name] on 12/09/2023 12:51:51 -------------------------------------------------------------------------------- Pain Assessment Details Patient Name: Date of Service: [name], [name]. 12/09/2023 12:30 PM Medical Record Number: [id number] Patient Account Number: [id number] Date of Birth/Sex: Treating [name]: March 01, 1941 (83 y.o. [name] Primary Care Santana Gosdin: [name] Other Clinician: Referring Shyler Holzman: Treating Iyonna Rish/Extender: [name] Andre [name] Weeks in Treatment: 7 Active Problems Location of Pain Severity and Description of  Pain Patient Has Paino No Site Locations Pain Management and Medication Current Pain Management: Electronic Signature(s) Signed: 12/09/2023 4:05:20 PM By: [name] Entered By: [name] on 12/09/2023 12:40:39 [name], [name] ([id number]) [id number] Page 7 of 9 -------------------------------------------------------------------------------- Patient/Caregiver Education Details Patient Name: Date of Service: [name], [name] 1/13/2025andnbsp12:30 PM Medical Record Number: [id number] Patient Account Number: [id number] Date of Birth/Gender: Treating [name]: 1941-09-12 (83 y.o. [name] Primary Care Physician: [name] Other Clinician: Referring Physician: Treating Physician/Extender: [name] in Treatment: 7 Education Assessment Education Provided To: Patient Education Topics Provided Wound/Skin Impairment: Handouts: Caring for Your Ulcer Methods: Explain/Verbal Responses: State content correctly Electronic Signature(s) Signed: 12/09/2023 4:05:20 PM By: [name] Entered By: [name] on 12/09/2023 12:52:01 -------------------------------------------------------------------------------- Wound Assessment Details Patient Name: Date of Service: [name], [name]. 12/09/2023 12:30 PM Medical Record Number: [id number] Patient Account Number: [id number] Date of Birth/Sex: Treating [name]: [birth year] (83 y.o. [name] Primary Care Aveleen Nevers: [name],  Arlyss Other Clinician: Referring Deronte Solis: Treating Lekisha Mcghee/Extender: [name] Andre [name] Arlyss Weeks in Treatment: 7 Wound Status Wound Number: 3 Primary Etiology: Arterial Insufficiency Ulcer Wound Location: Left Lower Leg Wound Status: Open Wounding Event: Trauma Comorbid History: Cataracts, Osteoarthritis, Received Radiation Date Acquired: 06/19/2023 Weeks Of Treatment: 7 Clustered Wound: No Photos [name], [name] ([id number])  [phone].pdf Page 8 of 9 Wound Measurements Length: (cm) 1.5 Width: (cm) 1 Depth: (cm) 0.7 Area: (cm) 1.178 Volume: (cm) 0.825 % Reduction in Area: -42.8% % Reduction in Volume: -43% Epithelialization: None Tunneling: No Undermining: No Wound Description Classification: Full Thickness Without Exposed Support Structures Exudate Amount: Medium Exudate Type: Serosanguineous Exudate Color: red, brown Foul Odor After Cleansing: No Slough/Fibrino Yes Wound Bed Granulation Amount: Small (1-33%) Exposed Structure Granulation Quality: Pink Fascia Exposed: No Necrotic Amount: Large (67-100%) Fat Layer (Subcutaneous Tissue) Exposed: Yes Necrotic Quality: Adherent Slough Tendon Exposed: No Muscle Exposed: No Joint Exposed: No Bone Exposed: No Treatment Notes Wound #3 (Lower Leg) Wound Laterality: Left Cleanser Soap and Water Discharge Instruction: Gently cleanse wound with antibacterial soap, rinse and pat dry prior to dressing wounds Peri-Wound Care Triamcinolone  Acetonide Cream, 0.1%, 15 (g) tube Discharge Instruction: Apply as directed. Topical Primary Dressing Promogran Matrix 4.34 (in) Discharge Instruction: Moisten w/normal saline or sterile water; Cover wound as directed. Do not remove from wound bed. Secondary Dressing ABD Pad 5x9 (in/in) Discharge Instruction: Cover with ABD pad Secured With Medipore T - 10M Medipore H Soft Cloth Surgical T ape ape, 2x2 (in/yd) Compression Wrap tubi grip Discharge Instruction: single layer size D Compression Stockings Add-Ons Electronic Signature(s) Signed: 12/09/2023 4:05:20 PM By: [name] Entered By: [name] on 12/09/2023 12:45:30 [name], [name] ([id number]) [id number] Page 9 of 9 -------------------------------------------------------------------------------- Vitals Details Patient Name: Date of Service: [name], [name]. 12/09/2023 12:30 PM Medical  Record Number: [id number] Patient Account Number: [id number] Date of Birth/Sex: Treating [name]: 31-May-1941 (83 y.o. [name] Primary Care Hedaya Latendresse: [name] Arlyss Other Clinician: Referring Bartlomiej Jenkinson: Treating Akayla Brass/Extender: [name] Andre [name] Arlyss Weeks in Treatment: 7 Vital Signs Time Taken: 12:40 Temperature (F): 98.5 Height (in): 64 Pulse (bpm): 72 Weight (lbs): 187 Respiratory Rate (breaths/min): 18 Body Mass Index (BMI): 32.1 Blood Pressure (mmHg): 205/83 Reference Range: 80 - 120 mg / dl Electronic Signature(s) Signed: 12/09/2023 4:05:20 PM By: [name] Entered By: [name] on 12/09/2023 12:40:30

## 2023-12-11 NOTE — Progress Notes (Signed)
 Haws, Caoilainn [name] ([id number]) [id number] Page 1 of 7 Visit Report for 12/09/2023 Chief Complaint Document Details Patient Name: Date of Service: [name], [name] 12/09/2023 12:30 PM Medical Record Number: [id number] Patient Account Number: [id number] Date of Birth/Sex: Treating RN: Jul 29, 1941 (83 y.o. [name] Primary Care Provider: Cleatus Molly Other Clinician: Referring Provider: Treating Provider/Extender: Bethena Andre Cleatus Molly Devra in Treatment: 7 Information Obtained from: Patient Chief Complaint Left LE ulcer Electronic Signature(s) Signed: 12/09/2023 12:48:40 PM By: Bethena Andre PA-C Entered By: Bethena Andre on 12/09/2023 12:48:40 -------------------------------------------------------------------------------- HPI Details Patient Name: Date of Service: [name], [name] 12/09/2023 12:30 PM Medical Record Number: [id number] Patient Account Number: [id number] Date of Birth/Sex: Treating RN: [birth year] (82 y.o. [name] Primary Care Provider: Cleatus Molly Other Clinician: Referring Provider: Treating Provider/Extender: Bethena Andre Cleatus Molly Devra in Treatment: 7 History of Present Illness HPI Description: Readmission: 10-18-2023 upon evaluation today patient appears to be doing poorly currently in regard to a wound over the left lower leg. This is something that apparently occurred as a result of the box falling out of the car when [name] open the back door that raised up onto her leg this occurred back in the summertime some around July or August they believe possibly July. Nonetheless this is a patient that I have seen previously although the last time I saw her personally was in 2023 although I think [name] did see Dr. Rosan earlier this year. Generally they are due to injuries. With that being said I do not have a specific finding as far as her blood flow is concerned we have several venous studies but not arterial I  am getting up sending her for an arterial evaluation at this point since this has not been healing despite her using antibiotic ointment and taking care of this daily I think that there may be something else going on here from a arterial flow's perspective. With that being said we definitely need to initiate treatment with some specific measures to try to get this cleaner. Patient does have a history of bilateral knee osteoarthritis, fibromyalgia, and what appears to be peripheral vascular disease although we know [name] definitely has peripheral venous disease. 10-28-2023 upon evaluation today patient appears to be doing well currently in regard to her wound. This is a little bit smaller though still has some depth I think that we are still have a little bit a ways to go to get this to completely heal obviously. Fortunately I do not see any signs of active infection locally or systemically which is good news. No fevers, chills, nausea, vomiting, or diarrhea. Leija, [name] ([id number]) [id number] Page 2 of 7 11-05-2023 upon evaluation today patient unfortunately continues to have some issues here with her wound and I feel like it is slowly turning around. With that being said it is a little bit too slow for my liking and I would like to see about getting her tested today for blood flow. 11-11-2023 upon evaluation patient appears to be doing better in regard to her wound. I do feel like that [name] is making some good progress here and I am very pleased in that regard. Fortunately I do not see any signs of active infection which is good news and in general I think there were making headway here towards closure. 11-18-2023 upon evaluation today patient appears to be doing well currently in regard to her wound which is actually showing signs of improvement this is good news. Fortunately  I do not see any evidence of worsening overall I believe that the patient is making good  headway here towards closure which is great news. No fevers, chills, nausea, vomiting, or diarrhea. 12-08-2022 upon evaluation today patient continues to have issues with her mood this is not healing as quickly as we would like to see. Fortunately I do not see any signs of active infection at this time. [name] is having some pain but I think a lot of this is just coming from the swelling of her legs in general. I do not feel like there is any evidence of active infection which is good news. Electronic Signature(s) Signed: 12/09/2023 1:13:22 PM By: Bethena Ferraris PA-C Entered By: Bethena Ferraris on 12/09/2023 13:13:22 -------------------------------------------------------------------------------- Physical Exam Details Patient Name: Date of Service: [name], [name] 12/09/2023 12:30 PM Medical Record Number: [id number] Patient Account Number: [id number] Date of Birth/Sex: Treating RN: Jul 31, 1941 (83 y.o. [name] Primary Care Provider: Cleatus Molly Other Clinician: Referring Provider: Treating Provider/Extender: Bethena Ferraris Cleatus Molly Weeks in Treatment: 7 Constitutional Well-nourished and well-hydrated in no acute distress. Respiratory normal breathing without difficulty. Psychiatric this patient is able to make decisions and demonstrates good insight into disease process. Alert and Oriented x 3. pleasant and cooperative. Notes Upon inspection patient's wound bed actually showed signs of good granulation epithelization at this point there was some slough and biofilm [name] was having a lot of increased pain today so therefore no sharp debridement was undertaken we likely will need to do that next week I did try to remove as much of the slough and biofilm as I could today. Electronic Signature(s) Signed: 12/09/2023 1:13:40 PM By: Bethena Ferraris PA-C Entered By: Bethena Ferraris on 12/09/2023 13:13:39 -------------------------------------------------------------------------------- Physician  Orders Details Patient Name: Date of Service: [name], [name] 12/09/2023 12:30 PM Meckley, ALMARIE CROME ([id number]) [phone].pdf Page 3 of 7 Medical Record Number: [id number] Patient Account Number: [id number] Date of Birth/Sex: Treating RN: 06/16/1941 (83 y.o. [name] Primary Care Provider: Cleatus Molly Other Clinician: Referring Provider: Treating Provider/Extender: Bethena Ferraris Cleatus Molly Devra in Treatment: 7 The following information was scribed by: Alverta [name] The information was scribed for: Bethena Ferraris Verbal / Phone Orders: No Diagnosis Coding Follow-up Appointments Return Appointment in 2 weeks. Bathing/ Shower/ Hygiene May shower with wound dressing protected with water repellent cover or cast protector. Edema Control - Orders / Instructions Tubigrip single layer applied. - size D Elevate, Exercise Daily and A void Standing for Long Periods of Time. Elevate legs to the level of the heart and pump ankles as often as possible Elevate leg(s) parallel to the floor when sitting. Wound Treatment Wound #3 - Lower Leg Wound Laterality: Left Cleanser: Soap and Water 1 x Per Day/30 Days Discharge Instructions: Gently cleanse wound with antibacterial soap, rinse and pat dry prior to dressing wounds Peri-Wound Care: Triamcinolone  Acetonide Cream, 0.1%, 15 (g) tube 1 x Per Day/30 Days Discharge Instructions: Apply as directed. Prim Dressing: Promogran Matrix 4.34 (in) 1 x Per Day/30 Days ary Discharge Instructions: Moisten w/normal saline or sterile water; Cover wound as directed. Do not remove from wound bed. Secondary Dressing: ABD Pad 5x9 (in/in) 1 x Per Day/30 Days Discharge Instructions: Cover with ABD pad Secured With: Medipore T - 23M Medipore H Soft Cloth Surgical T ape ape, 2x2 (in/yd) 1 x Per Day/30 Days Compression Wrap: tubi grip 1 x Per Day/30 Days Discharge Instructions: single layer size D Electronic Signature(s) Signed:  12/09/2023 4:05:20 PM By:  Epps, Carrie RN Signed: 12/09/2023 4:43:18 PM By: Bethena Ferraris PA-C Entered By: Alverta [name] on 12/09/2023 13:21:51 -------------------------------------------------------------------------------- Problem List Details Patient Name: Date of Service: [name], [name] 12/09/2023 12:30 PM Medical Record Number: [id number] Patient Account Number: [id number] Date of Birth/Sex: Treating RN: 1941/04/05 (83 y.o. [name] Primary Care Provider: Cleatus Molly Other Clinician: Referring Provider: Treating Provider/Extender: Bethena Ferraris Cleatus Molly Devra in Treatment: 7 Active Problems ICD-10 Encounter [name], [name] ([id number]) [id number] Page 4 of 7 Encounter Code Description Active Date MDM Diagnosis I87.332 Chronic venous hypertension (idiopathic) with ulcer and inflammation of left 10/18/2023 No Yes lower extremity L97.822 Non-pressure chronic ulcer of other part of left lower leg with fat layer 10/18/2023 No Yes exposed I73.89 Other specified peripheral vascular diseases 10/18/2023 No Yes M79.7 Fibromyalgia 10/18/2023 No Yes M17.0 Bilateral primary osteoarthritis of knee 10/18/2023 No Yes Inactive Problems Resolved Problems Electronic Signature(s) Signed: 12/09/2023 12:48:36 PM By: Bethena Ferraris PA-C Entered By: Bethena Ferraris on 12/09/2023 12:48:36 -------------------------------------------------------------------------------- Progress Note Details Patient Name: Date of Service: [name], [name] 12/09/2023 12:30 PM Medical Record Number: [id number] Patient Account Number: [id number] Date of Birth/Sex: Treating RN: [birth year] (83 y.o. [name] Primary Care Provider: Cleatus Molly Other Clinician: Referring Provider: Treating Provider/Extender: Bethena Ferraris Cleatus Molly Devra in Treatment: 7 Subjective Chief Complaint Information obtained from Patient Left LE ulcer History of Present Illness  (HPI) Readmission: 10-18-2023 upon evaluation today patient appears to be doing poorly currently in regard to a wound over the left lower leg. This is something that apparently occurred as a result of the box falling out of the car when [name] open the back door that raised up onto her leg this occurred back in the summertime some around July or August they believe possibly July. Nonetheless this is a patient that I have seen previously although the last time I saw her personally was in 2023 although I think [name] did see Dr. Rosan earlier this year. Generally they are due to injuries. With that being said I do not have a specific finding as far as her blood flow is concerned we have several venous studies but not arterial I am getting up sending her for an arterial evaluation at this point since this has not been healing despite her using antibiotic ointment and taking care of this daily I think that there may be something else going on here from a arterial flow's perspective. With that being said we definitely need to initiate treatment with some specific measures to try to get this cleaner. Patient does have a history of bilateral knee osteoarthritis, fibromyalgia, and what appears to be peripheral vascular disease although we know [name] definitely has peripheral venous disease. 10-28-2023 upon evaluation today patient appears to be doing well currently in regard to her wound. This is a little bit smaller though still has some depth I think that we are still have a little bit a ways to go to get this to completely heal obviously. Fortunately I do not see any signs of active infection locally or [name], [name] ([id number]) [phone].pdf Page 5 of 7 systemically which is good news. No fevers, chills, nausea, vomiting, or diarrhea. 11-05-2023 upon evaluation today patient unfortunately continues to have some issues here with her wound and I feel like it is slowly turning around.  With that being said it is a little bit too slow for my liking and I would like to see about getting her tested today for blood flow.  11-11-2023 upon evaluation patient appears to be doing better in regard to her wound. I do feel like that [name] is making some good progress here and I am very pleased in that regard. Fortunately I do not see any signs of active infection which is good news and in general I think there were making headway here towards closure. 11-18-2023 upon evaluation today patient appears to be doing well currently in regard to her wound which is actually showing signs of improvement this is good news. Fortunately I do not see any evidence of worsening overall I believe that the patient is making good headway here towards closure which is great news. No fevers, chills, nausea, vomiting, or diarrhea. 12-08-2022 upon evaluation today patient continues to have issues with her mood this is not healing as quickly as we would like to see. Fortunately I do not see any signs of active infection at this time. [name] is having some pain but I think a lot of this is just coming from the swelling of her legs in general. I do not feel like there is any evidence of active infection which is good news. Objective Constitutional Well-nourished and well-hydrated in no acute distress. Vitals Time Taken: 12:40 PM, Height: 64 in, Weight: 187 lbs, BMI: 32.1, Temperature: 98.5 F, Pulse: 72 bpm, Respiratory Rate: 18 breaths/min, Blood Pressure: 205/83 mmHg. Respiratory normal breathing without difficulty. Psychiatric this patient is able to make decisions and demonstrates good insight into disease process. Alert and Oriented x 3. pleasant and cooperative. General Notes: Upon inspection patient's wound bed actually showed signs of good granulation epithelization at this point there was some slough and biofilm [name] was having a lot of increased pain today so therefore no sharp debridement was undertaken we  likely will need to do that next week I did try to remove as much of the slough and biofilm as I could today. Integumentary (Hair, Skin) Wound #3 status is Open. Original cause of wound was Trauma. The date acquired was: 06/19/2023. The wound has been in treatment 7 weeks. The wound is located on the Left Lower Leg. The wound measures 1.5cm length x 1cm width x 0.7cm depth; 1.178cm^2 area and 0.825cm^3 volume. There is Fat Layer (Subcutaneous Tissue) exposed. There is no tunneling or undermining noted. There is a medium amount of serosanguineous drainage noted. There is small (1- 33%) pink granulation within the wound bed. There is a large (67-100%) amount of necrotic tissue within the wound bed including Adherent Slough. 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 Bathing/ Shower/ Hygiene: May shower with wound dressing protected with water repellent cover or cast protector. Edema Control - Orders / Instructions: Tubigrip single layer applied. - size D Elevate, Exercise Daily and Avoid Standing for Long Periods of Time. Elevate legs to the level of the heart and pump ankles as often as possible Elevate leg(s) parallel to the floor when sitting. WOUND #3: - Lower Leg Wound Laterality: Left Cleanser: Soap and Water 1 x Per Day/30 Days Discharge Instructions: Gently cleanse wound with antibacterial soap, rinse and pat dry prior to dressing wounds Peri-Wound Care: Triamcinolone  Acetonide Cream, 0.1%, 15 (g) tube 1 x Per Day/30 Days Discharge Instructions: Apply as directed. Prim Dressing: Promogran Matrix 4.34 (in) 1 x Per  Day/30 Days ary Discharge Instructions: Moisten  w/normal saline or sterile water; Cover wound as directed. Do not remove from wound bed. [name], [name] ([id number]) [id number] Page 6 of 7 Secondary Dressing: ABD Pad 5x9 (in/in) 1 x Per Day/30 Days Discharge Instructions: Cover with ABD pad Secured With: Medipore T - 49M Medipore H Soft Cloth Surgical T ape ape, 2x2 (in/yd) 1 x Per Day/30 Days Compression Wrap: tubi grip 1 x Per Day/30 Days Discharge Instructions: single layer size D 1. I would recommend that we have the patient continue to monitor for any evidence of infection or worsening. Based on what I see I do believe that we are doing well with the ABD pads along with the Promogran. 2. I am good recommend that [name] should be using the Tubigrip and trying to elevate her legs is much as possible. I really think [name] probably needs the compression wraps but [name] has had trouble with these in the past and tends to end up cutting them down until they get to the point that is have to be cut off. Nonetheless I am not sure if organ to be able to get this healed or not outside of that [name] does need to try to elevate her legs is much as possible and I discussed that with her [name] has an excellent chair for this at home and I think exercise would also be extremely beneficial for her. We will see patient back for reevaluation in 1 week here in the clinic. If anything worsens or changes patient will contact our office for additional recommendations. Electronic Signature(s) Signed: 12/09/2023 1:14:32 PM By: Bethena Ferraris PA-C Entered By: Bethena Ferraris on 12/09/2023 13:14:32 -------------------------------------------------------------------------------- SuperBill Details Patient Name: Date of Service: [name], [name] 12/09/2023 Medical Record Number: [id number] Patient Account Number: [id number] Date of Birth/Sex: Treating RN: [birth year] (83 y.o. [name] Primary Care Provider: Cleatus Molly Other  Clinician: Referring Provider: Treating Provider/Extender: Bethena Ferraris Cleatus Molly Weeks in Treatment: 7 Diagnosis Coding ICD-10 Codes Code Description [phone] Chronic venous hypertension (idiopathic) with ulcer and inflammation of left lower extremity L97.822 Non-pressure chronic ulcer of other part of left lower leg with fat layer exposed I73.89 Other specified peripheral vascular diseases M79.7 Fibromyalgia M17.0 Bilateral primary osteoarthritis of knee Facility Procedures : CPT4 Code: [id number] Description: 00787 - WOUND CARE VISIT-LEV 2 EST PT Modifier: Quantity: 1 Physician Procedures : CPT4: Description Modifier Code [id number] 99213 - WC PHYS LEVEL 3 - EST PT ICD-10 Diagnosis Description I87.332 Chronic venous hypertension (idiopathic) with ulcer and inflammation of left lower extremity L97.822 Non-pressure chronic ulcer of other part of  left lower leg with fat layer exposed I73.89 Other specified peripheral vascular diseases M79.7 Fibromyalgia Quantity: 1 : Strange, E CPT4: G2211 Visit complexity inherent to [facility] ass[facility] w/medical care services that serve as the continuing focal point for ongoing care related to a patient's condition ICD-10 Diagnosis Description I87.332 Chronic venous hypertension (idiopathic) with  ulcer and inflammation of left lower extremity [name] ([id number]) [id number] Pa I87.332 Chronic venous hypertension (idiopathic) with ulcer and inflammation of left lower extremity L97.822 Non-pressure chronic ulcer of  other part of left lower leg with fat layer exposed I73.89 Other specified peripheral vascular diseases Quantity: 1 ge 7 of 7 Electronic Signature(s) Signed: 12/09/2023 1:21:33 PM By: Alverta Sailors RN Signed: 12/09/2023 4:43:18 PM By: Bethena Ferraris PA-C Previous Signature: 12/09/2023 1:16:56 PM Version By: Bethena Ferraris PA-C Entered By: Alverta [name] on 12/09/2023 13:21:33

## 2023-12-17 ENCOUNTER — Encounter: Payer: PPO | Admitting: Physician Assistant

## 2023-12-17 DIAGNOSIS — L97822 Non-pressure chronic ulcer of other part of left lower leg with fat layer exposed: Secondary | ICD-10-CM | POA: Diagnosis not present

## 2023-12-17 DIAGNOSIS — I70248 Atherosclerosis of native arteries of left leg with ulceration of other part of lower left leg: Secondary | ICD-10-CM | POA: Diagnosis not present

## 2023-12-17 DIAGNOSIS — I87332 Chronic venous hypertension (idiopathic) with ulcer and inflammation of left lower extremity: Secondary | ICD-10-CM | POA: Diagnosis not present

## 2023-12-17 NOTE — Progress Notes (Signed)
[name], [name] ([id number]) [id number] Page 1 of 2 Visit Report for 12/17/2023 Arrival Information Details Patient Name: Date of Service: [name], [name] 12/17/2023 3:30 PM Medical Record Number: [id number] Patient Account Number: [id number] Date of Birth/Sex: Treating RN: Apr 16, 1941 (83 y.o. [name] Primary Care Devaughn Savant: [name] Givens Other Clinician: Referring Kriss Perleberg: Treating Reece Fehnel/Extender: Kari Baars in Treatment: 8 Visit Information History Since Last Visit Added or deleted any medications: No Patient Arrived: Ambulatory Any new allergies or adverse reactions: No Arrival Time: 15:36 Had a fall or experienced change in No Accompanied By: walker activities of daily living that may affect Transfer Assistance: None risk of falls: Patient Identification Verified: Yes Signs or symptoms of abuse/neglect since last visito No Secondary Verification Process Completed: Yes Hospitalized since last visit: No Patient Requires Transmission-Based Precautions: No Implantable device outside of the clinic excluding No Patient Has Alerts: Yes cellular tissue based products placed in the center Patient Alerts: NON compressible since last visit: Has Dressing in Place as Prescribed: Yes Pain Present Now: No Electronic Signature(s) Unsigned Entered ByYevonne Pax on 12/17/2023 15:37:27 -------------------------------------------------------------------------------- Vitals Details Patient Name: Date of Service: [name], [name] 12/17/2023 3:30 PM Medical Record Number: [id number] Patient Account Number: [id number] Date of Birth/Sex: Treating RN: Jul 17, 1941 (83 y.o. [name] Primary Care Reis Goga: [name] Givens Other Clinician: Referring Moneisha Vosler: Treating Layna Roeper/Extender: [name] in Treatment: 8 Vital Signs Time Taken: 15:37 Temperature (F): 97.5 Height (in): 64 Pulse (bpm):  75 Weight (lbs): 187 Respiratory Rate (breaths/min): 18 Body Mass Index (BMI): 32.1 Blood Pressure (mmHg): 194/81 Reference Range: 80 - 120 mg / dl [name], [name] ([id number]) [id number] Page 2 of 2 Electronic Signature(s) Unsigned Entered By: Yevonne Pax on 12/17/2023 15:38:01 Signature(s): Date(s):

## 2023-12-17 NOTE — Progress Notes (Addendum)
[name], [name] ([id number]) [id number] Page 1 of 6 Visit Report for 12/17/2023 Chief Complaint Document Details Patient Name: Date of Service: [name], [name] 12/17/2023 3:30 PM Medical Record Number: [id number] Patient Account Number: [id number] Date of Birth/Sex: Treating RN: Apr 07, 1941 (83 y.o. [name] Other Clinician: Referring Provider: Treating Provider/Extender: Kari Baars in Treatment: 8 Information Obtained from: Patient Chief Complaint Left LE ulcer Electronic Signature(s) Signed: 12/17/2023 3:35:55 PM By: Allen Derry PA-C Entered By: Allen Derry on 12/17/2023 15:35:55 -------------------------------------------------------------------------------- HPI Details Patient Name: Date of Service: [name], [name]. 12/17/2023 3:30 PM Medical Record Number: [id number] Patient Account Number: [id number] Date of Birth/Sex: Treating RN: Feb 18, 1941 (83 y.o. [name] Other Clinician: Referring Provider: Treating Provider/Extender: Kari Baars in Treatment: 8 History of Present Illness HPI Description: Readmission: 10-18-2023 upon evaluation today patient appears to be doing poorly currently in regard to a wound over the left lower leg. This is something that apparently occurred as a result of the box falling out of the car when she open the back door that raised up onto her leg this occurred back in the summertime some around July or August they believe possibly July. Nonetheless this is a patient that I have seen previously although the last time I saw her personally was in 2023 although I think she did see Dr. Mikey Bussing earlier this year. Generally they are due to injuries. With that being said I do not have a specific finding as far as her blood flow is concerned we have several venous studies but not arterial I am  getting up sending her for an arterial evaluation at this point since this has not been healing despite her using antibiotic ointment and taking care of this daily I think that there may be something else going on here from a arterial flow's perspective. With that being said we definitely need to initiate treatment with some specific measures to try to get this cleaner. Patient does have a history of bilateral knee osteoarthritis, fibromyalgia, and what appears to be peripheral vascular disease although we know she definitely has peripheral venous disease. 12-17-2023 upon evaluation today patient appears to be doing pretty well currently in regard to her wound. She still having a lot of pain she really does not want me to perform any sharp debridement. With that being said her wound is showing signs of improvement and very pleased in that regard. [name], [name] ([id number]) [id number] Page 2 of 6 Electronic Signature(s) Signed: 12/17/2023 5:46:02 PM By: Allen Derry PA-C Previous Signature: 12/17/2023 5:45:46 PM Version By: Allen Derry PA-C Entered By: Allen Derry on 12/17/2023 17:46:02 -------------------------------------------------------------------------------- Physical Exam Details Patient Name: Date of Service: [name], [name]. 12/17/2023 3:30 PM Medical Record Number: [id number] Patient Account Number: [id number] Date of Birth/Sex: Treating RN: [birth year] (83 y.o. [name] Other Clinician: Referring Provider: Treating Provider/Extender: [name] in Treatment: 8 Constitutional Obese and well-hydrated in no acute distress. Respiratory normal breathing without difficulty. Psychiatric this patient is able to make decisions and demonstrates good insight into disease process. Alert and Oriented x 3. pleasant and cooperative. Notes Upon inspection patient's wound bed actually showed  signs of good granulation and epithelization at this point. Fortunately I do not see any evidence of worsening overall and I believe that the patient is making good headway here towards closure which  is excellent news. Electronic Signature(s) Signed: 12/17/2023 5:46:13 PM By: Allen Derry PA-C Entered By: Allen Derry on 12/17/2023 17:46:13 -------------------------------------------------------------------------------- Physician Orders Details Patient Name: Date of Service: [name], [name]. 12/17/2023 3:30 PM Medical Record Number: [id number] Patient Account Number: [id number] Date of Birth/Sex: Treating RN: Jun 24, 1941 (83 y.o. [name] Other Clinician: Referring Provider: Treating Provider/Extender: Kari Baars in Treatment: 8 The following information was scribed by: Yevonne Pax The information was scribed for: Allen Derry Verbal / Phone Orders: No Diagnosis Coding ICD-10 Coding Code Description [name], [name] ([id number]) [phone].pdf Page 3 of 6 [phone] Chronic venous hypertension (idiopathic) with ulcer and inflammation of left lower extremity L97.822 Non-pressure chronic ulcer of other part of left lower leg with fat layer exposed I73.89 Other specified peripheral vascular diseases M79.7 Fibromyalgia M17.0 Bilateral primary osteoarthritis of knee Follow-up Appointments Return Appointment in 1 week. Bathing/ Shower/ Hygiene May shower with wound dressing protected with water repellent cover or cast protector. Edema Control - Orders / Instructions Tubigrip single layer applied. - size D Elevate, Exercise Daily and A void Standing for Long Periods of Time. Elevate legs to the level of the heart and pump ankles as often as possible Elevate leg(s) parallel to the floor when sitting. Wound Treatment Wound #3 - Lower Leg Wound Laterality: Left Cleanser: Soap and Water 1 x Per  Day/30 Days Discharge Instructions: Gently cleanse wound with antibacterial soap, rinse and pat dry prior to dressing wounds Peri-Wound Care: Triamcinolone Acetonide Cream, 0.1%, 15 (g) tube 1 x Per Day/30 Days Discharge Instructions: Apply as directed. Prim Dressing: Promogran Matrix 4.34 (in) 1 x Per Day/30 Days ary Discharge Instructions: Moisten w/normal saline or sterile water; Cover wound as directed. Do not remove from wound bed. Secondary Dressing: ABD Pad 5x9 (in/in) 1 x Per Day/30 Days Discharge Instructions: Cover with ABD pad Secured With: Medipore T - 91M Medipore H Soft Cloth Surgical T ape ape, 2x2 (in/yd) 1 x Per Day/30 Days Compression Wrap: tubi grip 1 x Per Day/30 Days Discharge Instructions: single layer size D Electronic Signature(s) Signed: 12/17/2023 4:16:54 PM By: Yevonne Pax RN Signed: 12/17/2023 6:09:39 PM By: Allen Derry PA-C Entered By: Yevonne Pax on 12/17/2023 16:16:54 -------------------------------------------------------------------------------- Problem List Details Patient Name: Date of Service: [name], [name]. 12/17/2023 3:30 PM Medical Record Number: [id number] Patient Account Number: [id number] Date of Birth/Sex: Treating RN: 1941-11-10 (83 y.o. [name] Other Clinician: Referring Provider: Treating Provider/Extender: Kari Baars in Treatment: 8 Active Problems ICD-10 Encounter Code Description Active Date MDM Diagnosis I87.332 Chronic venous hypertension (idiopathic) with ulcer and inflammation of left 10/18/2023 No Yes lower extremity [name], [name] ([id number]) [id number] Page 4 of 6 [phone] Non-pressure chronic ulcer of other part of left lower leg with fat layer 10/18/2023 No Yes exposed I73.89 Other specified peripheral vascular diseases 10/18/2023 No Yes M79.7 Fibromyalgia 10/18/2023 No Yes M17.0 Bilateral primary osteoarthritis of  knee 10/18/2023 No Yes Inactive Problems Resolved Problems Electronic Signature(s) Signed: 12/17/2023 3:35:51 PM By: Allen Derry PA-C Entered By: Allen Derry on 12/17/2023 15:35:51 -------------------------------------------------------------------------------- Progress Note Details Patient Name: Date of Service: [name], [name]. 12/17/2023 3:30 PM Medical Record Number: [id number] Patient Account Number: [id number] Date of Birth/Sex: Treating RN: 06/08/1941 (83 y.o. [name] Other Clinician: Referring Provider: Treating Provider/Extender: Kari Baars in Treatment: 8 Subjective Chief Complaint Information obtained from  Patient Left LE ulcer History of Present Illness (HPI) Readmission: 10-18-2023 upon evaluation today patient appears to be doing poorly currently in regard to a wound over the left lower leg. This is something that apparently occurred as a result of the box falling out of the car when she open the back door that raised up onto her leg this occurred back in the summertime some around July or August they believe possibly July. Nonetheless this is a patient that I have seen previously although the last time I saw her personally was in 2023 although I think she did see Dr. Mikey Bussing earlier this year. Generally they are due to injuries. With that being said I do not have a specific finding as far as her blood flow is concerned we have several venous studies but not arterial I am getting up sending her for an arterial evaluation at this point since this has not been healing despite her using antibiotic ointment and taking care of this daily I think that there may be something else going on here from a arterial flow's perspective. With that being said we definitely need to initiate treatment with some specific measures to try to get this cleaner. 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 12-17-2023 upon evaluation today patient appears to be doing pretty well currently in regard to her wound. She still having a lot of pain she really does not want me to perform any sharp debridement. With that being said her wound is showing signs of improvement and very pleased in that regard. [name], [name] ([id number]) [id number] Page 5 of 6 Objective Constitutional Obese and well-hydrated in no acute distress. Vitals Time Taken: 3:37 PM, Height: 64 in, Weight: 187 lbs, BMI: 32.1, Temperature: 97.5 F, Pulse: 75 bpm, Respiratory Rate: 18 breaths/min, Blood Pressure: 194/81 mmHg. Respiratory normal breathing without difficulty. Psychiatric this patient is able to make decisions and demonstrates good insight into disease process. Alert and Oriented x 3. pleasant and cooperative. General Notes: Upon inspection patient's wound bed actually showed signs of good granulation and epithelization at this point. Fortunately I do not see any evidence of worsening overall and I believe that the patient is making good headway here towards closure which is excellent news. Integumentary (Hair, Skin) Wound #3 status is Open. Original cause of wound was Trauma. The date acquired was: 06/19/2023. The wound has been in treatment 8 weeks. The wound is located on the Left Lower Leg. The wound measures 1.2cm length x 1cm width x 0.4cm depth; 0.942cm^2 area and 0.377cm^3 volume. There is Fat Layer (Subcutaneous Tissue) exposed. There is no tunneling or undermining noted. There is a medium amount of serosanguineous drainage noted. There is small (1- 33%) pink granulation within the wound bed. There is a large (67-100%) amount of necrotic tissue within the wound bed including Adherent Slough. Assessment Active Problems ICD-10 Chronic venous hypertension  (idiopathic) with ulcer and inflammation of left lower extremity Non-pressure chronic ulcer of other part of left lower leg with fat layer exposed Other specified peripheral vascular diseases Fibromyalgia Bilateral primary osteoarthritis of knee Plan Follow-up Appointments: Return Appointment in 1 week. Bathing/ Shower/ Hygiene: May shower with wound dressing protected with water repellent cover or cast protector. Edema Control - Orders / Instructions: Tubigrip single layer applied. - size D Elevate, Exercise Daily and Avoid Standing for Long Periods of Time. Elevate legs to the  level of the heart and pump ankles as often as possible Elevate leg(s) parallel to the floor when sitting. WOUND #3: - Lower Leg Wound Laterality: Left Cleanser: Soap and Water 1 x Per Day/30 Days Discharge Instructions: Gently cleanse wound with antibacterial soap, rinse and pat dry prior to dressing wounds Peri-Wound Care: Triamcinolone Acetonide Cream, 0.1%, 15 (g) tube 1 x Per Day/30 Days Discharge Instructions: Apply as directed. Prim Dressing: Promogran Matrix 4.34 (in) 1 x Per Day/30 Days ary Discharge Instructions: Moisten w/normal saline or sterile water; Cover wound as directed. Do not remove from wound bed. Secondary Dressing: ABD Pad 5x9 (in/in) 1 x Per Day/30 Days Discharge Instructions: Cover with ABD pad Secured With: Medipore T - 12M Medipore H Soft Cloth Surgical T ape ape, 2x2 (in/yd) 1 x Per Day/30 Days Com pression Wrap: tubi grip 1 x Per Day/30 Days Discharge Instructions: single layer size D 1. Based on what I see I do think I am going to recommend that we have the patient continue to utilize the wound care measures as before. We do seem to be seeing some good improvement I think that the collagen is doing a good job I was able with saline gauze and a sterile Q-tip to get the majority of the slough and biofilm off the surface of the wound. 2. I will recommend as well that the patient  should continue to monitor for any signs of infection and if anything changes she knows to contact the office let me know. We will see patient back for reevaluation in 1 week here in the clinic. If anything worsens or changes patient will contact our office for additional recommendations. Electronic Signature(s) Signed: 12/17/2023 5:46:44 PM By: Harrietta Guardian, [place] [name] ([id number]) [id number] Page 6 of 6 Entered By: Allen Derry on 12/17/2023 17:46:44 -------------------------------------------------------------------------------- SuperBill Details Patient Name: Date of Service: [name], [name] 12/17/2023 Medical Record Number: [id number] Patient Account Number: [id number] Date of Birth/Sex: Treating RN: [birth year] (83 y.o. [name] Other Clinician: Referring Provider: Treating Provider/Extender: [name] in Treatment: 8 Diagnosis Coding ICD-10 Codes Code Description [phone] Chronic venous hypertension (idiopathic) with ulcer and inflammation of left lower extremity L97.822 Non-pressure chronic ulcer of other part of left lower leg with fat layer exposed I73.89 Other specified peripheral vascular diseases M79.7 Fibromyalgia M17.0 Bilateral primary osteoarthritis of knee Facility Procedures : CPT4 Code: [id number] Description: 99213 - WOUND CARE VISIT-LEV 3 EST PT Modifier: Quantity: 1 Physician Procedures : CPT4 Code Description Modifier [id number] - WC PHYS LEVEL 3 - EST PT ICD-10 Diagnosis Description I87.332 Chronic venous hypertension (idiopathic) with ulcer and inflammation of left lower extremity L97.822 Non-pressure chronic ulcer of other part  of left lower leg with fat layer exposed I73.89 Other specified peripheral vascular diseases M79.7 Fibromyalgia Quantity: 1 Electronic Signature(s) Signed: 12/17/2023 5:51:06 PM By: Allen Derry PA-C Previous Signature:  12/17/2023 4:17:42 PM Version By: Yevonne Pax RN Entered By: Allen Derry on 12/17/2023 17:51:05

## 2023-12-26 ENCOUNTER — Ambulatory Visit: Payer: PPO | Admitting: Physician Assistant

## 2023-12-31 ENCOUNTER — Ambulatory Visit: Payer: PPO | Admitting: Physician Assistant

## 2024-01-08 ENCOUNTER — Ambulatory Visit: Payer: PPO | Admitting: Physician Assistant

## 2024-01-16 ENCOUNTER — Ambulatory Visit: Payer: PPO | Admitting: Physician Assistant

## 2024-01-26 ENCOUNTER — Other Ambulatory Visit: Payer: Self-pay | Admitting: Family Medicine

## 2024-01-28 NOTE — Telephone Encounter (Signed)
 LOV 10/15/23  NOV nothing scheduled  Last refill: 08/28/23 #20 tab w/ 0 refills

## 2024-01-29 MED ORDER — LORAZEPAM 0.5 MG PO TABS
0.2500 mg | ORAL_TABLET | Freq: Three times a day (TID) | ORAL | 0 refills | Status: AC | PRN
Start: 2024-01-29 — End: ?

## 2024-01-29 NOTE — Telephone Encounter (Signed)
 Sent. Thanks.

## 2024-02-04 NOTE — Progress Notes (Unsigned)
   [name], [name], [name] [address] [place], [address]  Phone: [phone]  FAX: [phone]  [name] - 83 y.o. female  MRN [id number]  Date of Birth: 1941/04/24  Date: 02/05/2024  PCP: Joaquim Nam, [name]  Referral: Joaquim Nam, [name]  No chief complaint on file.  Subjective:   [name] is an 83 y.o. very pleasant female patient with There is no height or weight on file to calculate BMI. who presents with the following:  She is very well-known patient who I recall will, having seen her for years.  Last time I saw her was about a year ago with some left-sided knee osteoarthritis.  She did have some cellulitis of her leg and was seen wound care in January 2025.  While she has not had any x-rays in a few years, the last plain knee x-rays that we obtained did show some severe arthritis of both knees.    Review of Systems is noted in the HPI, as appropriate  Objective:   There were no vitals taken for this visit.  GEN: No acute distress; alert,appropriate. PULM: Breathing comfortably in no respiratory distress PSYCH: Normally interactive.   Laboratory and Imaging Data:  Assessment and Plan:   ***

## 2024-02-05 ENCOUNTER — Ambulatory Visit (INDEPENDENT_AMBULATORY_CARE_PROVIDER_SITE_OTHER): Admitting: Family Medicine

## 2024-02-05 ENCOUNTER — Encounter: Payer: Self-pay | Admitting: Family Medicine

## 2024-02-05 VITALS — BP 140/80 | HR 64 | Temp 97.4°F | Ht 64.0 in | Wt 193.5 lb

## 2024-02-05 DIAGNOSIS — M170 Bilateral primary osteoarthritis of knee: Secondary | ICD-10-CM

## 2024-02-05 DIAGNOSIS — R300 Dysuria: Secondary | ICD-10-CM

## 2024-02-05 LAB — POC URINALSYSI DIPSTICK (AUTOMATED)
Bilirubin, UA: NEGATIVE
Blood, UA: NEGATIVE
Glucose, UA: NEGATIVE
Ketones, UA: NEGATIVE
Leukocytes, UA: NEGATIVE
Nitrite, UA: NEGATIVE
Protein, UA: NEGATIVE
Spec Grav, UA: 1.01 (ref 1.010–1.025)
Urobilinogen, UA: 0.2 U/dL
pH, UA: 7 (ref 5.0–8.0)

## 2024-02-05 MED ORDER — TRIAMCINOLONE ACETONIDE 40 MG/ML IJ SUSP
40.0000 mg | Freq: Once | INTRAMUSCULAR | Status: AC
  Administered 2024-02-05: 40 mg via INTRA_ARTICULAR

## 2024-02-05 NOTE — Patient Instructions (Signed)
 OSTEOARTHRITIS: Over the counter  Tylenol: 2 tablets up to 3-4 times a day  Topical Capzaicin Cream, as needed (wear glove to put on) - THIS IS EXCEPTIONALLY HOT  Supplements: Tart cherry juice and Curcumin (Turmeric extract) have good scientific evidence  - get the concentrated capsules or gelcaps over the counter so you do not get the calories from the juice.  Weight loss will always take stress off of the joints and back  Voltaren 1% gel, over the counter You can apply up to 4 times a day  This can be applied to any joint: knee, wrist, fingers, elbows, shoulders, feet and ankles. Can apply to any tendon: tennis elbow, achilles, tendon, rotator cuff or any other tendon.  Minimal is absorbed in the bloodstream: ok with oral anti-inflammatory or a blood thinner.  Cost is about 9 dollars  Ice joints on bad days, 20 min, 2-3 x / day REGULAR EXERCISE: swimming, Yoga, Tai Chi, bicycle (NON-IMPACT activity)

## 2024-02-11 DIAGNOSIS — Z961 Presence of intraocular lens: Secondary | ICD-10-CM | POA: Diagnosis not present

## 2024-02-11 DIAGNOSIS — H353131 Nonexudative age-related macular degeneration, bilateral, early dry stage: Secondary | ICD-10-CM | POA: Diagnosis not present

## 2024-04-14 ENCOUNTER — Encounter (INDEPENDENT_AMBULATORY_CARE_PROVIDER_SITE_OTHER): Payer: Self-pay

## 2024-04-17 ENCOUNTER — Telehealth: Admitting: Physician Assistant

## 2024-04-17 DIAGNOSIS — R3989 Other symptoms and signs involving the genitourinary system: Secondary | ICD-10-CM

## 2024-04-17 MED ORDER — CEPHALEXIN 500 MG PO CAPS: 500.0000 mg | ORAL_CAPSULE | Freq: Two times a day (BID) | ORAL | 0 refills | Status: DC

## 2024-04-17 NOTE — Progress Notes (Signed)

## 2024-05-01 DIAGNOSIS — G8929 Other chronic pain: Secondary | ICD-10-CM | POA: Diagnosis not present

## 2024-05-01 DIAGNOSIS — M170 Bilateral primary osteoarthritis of knee: Secondary | ICD-10-CM | POA: Diagnosis not present

## 2024-05-01 DIAGNOSIS — M25561 Pain in right knee: Secondary | ICD-10-CM | POA: Diagnosis not present

## 2024-05-01 DIAGNOSIS — M25562 Pain in left knee: Secondary | ICD-10-CM | POA: Diagnosis not present

## 2024-05-01 DIAGNOSIS — L97211 Non-pressure chronic ulcer of right calf limited to breakdown of skin: Secondary | ICD-10-CM | POA: Diagnosis not present

## 2024-05-22 ENCOUNTER — Ambulatory Visit: Payer: Self-pay

## 2024-05-22 NOTE — Telephone Encounter (Signed)
 Noted. Thanks.

## 2024-05-22 NOTE — Telephone Encounter (Signed)
 FYI Only or Action Required?: Action required by provider: request for appointment.  Patient was last seen in primary care on 02/05/2024 by Watt Mirza, MD. Called Nurse Triage reporting Leg Injury. Symptoms began several days ago. Interventions attempted: Prescription medications: Doxycycline . Symptoms are: stable.  Triage Disposition: See Physician Within 24 Hours  Patient/caregiver understands and will follow disposition?: YesCopied from CRM [id number]. Topic: Clinical - Red Word Triage >> May 22, 2024  9:24 AM [name] wrote: Red Word that prompted transfer to Nurse Triage: Patient states that she has a cut on her left leg which happened 3-4 days ago but it will not close, and bled a lot. Reason for Disposition  [1] High-risk adult (e.g., age > 60 years, osteoporosis, chronic steroid use) AND [2] limping  Answer Assessment - Initial Assessment Questions 1. MECHANISM: How did the injury happen? (e.g., twisting injury, direct blow)      Metal part of door cut left leg 2. ONSET: When did the injury happen? (Minutes or hours ago)      4-5 days ago  3. LOCATION: Where is the injury located?      Left leg 4. APPEARANCE of INJURY: What does the injury look like?  (e.g., deformity of leg)     V-shaped; open;  5. SEVERITY: Can you put weight on that leg? Can you walk?      No pain  6. SIZE: For cuts, bruises, or swelling, ask: How large is it? (e.g., inches or centimeters)      1 inch 7. PAIN: Is there pain? If Yes, ask: How bad is the pain?   What does it keep you from doing? (e.g., Scale 1-10; or mild, moderate, severe)   -  NONE: (0): no pain   -  MILD (1-3): doesn't interfere with normal activities    -  MODERATE (4-7): interferes with normal activities (e.g., work or school) or awakens from sleep, limping    -  SEVERE (8-10): excruciating pain, unable to do any normal activities, unable to walk     mild  9. OTHER SYMPTOMS: Do you have any other symptoms?       Denies    Pt taking oral antibiotic-  Doxycyline  she had on hand and feels that  has helped. Bleeding has stopped. No oozing. No fever. .No office appts available today. RN advised pt to go to UC today. Pt stated she is going to call wound care office and see if she can get in there today. RN suggested call back to make up follow-up appt for next week. Pt stated she will go to wound care or UC today.  Protocols used: Leg Injury-A-AH

## 2024-05-26 ENCOUNTER — Ambulatory Visit (INDEPENDENT_AMBULATORY_CARE_PROVIDER_SITE_OTHER): Admitting: Family Medicine

## 2024-05-26 ENCOUNTER — Encounter: Payer: Self-pay | Admitting: Family Medicine

## 2024-05-26 VITALS — HR 77 | Temp 97.9°F | Ht 64.0 in

## 2024-05-26 DIAGNOSIS — S81802A Unspecified open wound, left lower leg, initial encounter: Secondary | ICD-10-CM

## 2024-05-26 DIAGNOSIS — L03116 Cellulitis of left lower limb: Secondary | ICD-10-CM | POA: Diagnosis not present

## 2024-05-26 MED ORDER — AMOXICILLIN-POT CLAVULANATE 875-125 MG PO TABS: 1.0000 | ORAL_TABLET | Freq: Two times a day (BID) | ORAL | 0 refills | Status: DC

## 2024-05-26 NOTE — Progress Notes (Unsigned)
 L leg.  Bumped it on the 16th or the 17th. She had some doxycyline that she has been taking. Started taking doxy in the meantime, twice a day.  No fevers.  Noticed itching around the neck and then a faint pink rash at the neck several days after starting the doxycycline  that she had on hand.  Meds, vitals, and allergies reviewed.   ROS: Per HPI unless specifically indicated in ROS section   3x4 cm V shaped cut on the L lateral mid shin.    26cm anterior vertical pinkish changes on the L shin.  Some of that is chronic but it is asymmetric from the R leg.  Circumferential pinkish changes but less affected area on the back of the L shin.     Rrr Ctab Faint blanching pinkish rash on the B posterior neck, not dermatomal.  No lip swelling No stridor.

## 2024-05-26 NOTE — Patient Instructions (Addendum)
 Stop doxy.  Change to augmentin .  Let me know if you can't get set up with the wound clinic.  If fever, more pain, then to ER.  Update us  Thursday, sooner if needed- phone call please.   Keep it clean and elevate when possible.  Take care.  Glad to see you.

## 2024-05-27 ENCOUNTER — Telehealth: Payer: Self-pay

## 2024-05-27 ENCOUNTER — Ambulatory Visit: Payer: Self-pay

## 2024-05-27 NOTE — Assessment & Plan Note (Signed)
 Discussed options. Stop doxy.  Change to augmentin .  Patient/family can let me know if she can't get set up with the wound clinic.  If fever, more pain, then to ER.  Update us  Thursday, sooner if needed- phone call please.   Keep it clean and elevate when possible.  ER cautions discussed with patient.

## 2024-05-27 NOTE — Telephone Encounter (Signed)
 If she doesn't go to the ER, then please update us  with a phone call about her status tomorrow AM.

## 2024-05-27 NOTE — Telephone Encounter (Signed)
 Patient notified. She will also update us  tomorrow if she does not go to the ER

## 2024-05-27 NOTE — Telephone Encounter (Signed)
 Copied from CRM [id number]. Topic: General - Other >> May 27, 2024 11:37 AM Revonda D wrote: Reason for CRM: Pt's daughter Barnie is calling to speak with nurse regarding the pt. Barnie stated that the patient was experiencing mild itching before she was prescribed the antibiotic for her wound on yesterday. Barnie stated that since taking the medication that itching has gotten worse.

## 2024-05-27 NOTE — Telephone Encounter (Signed)
 FYI Only or Action Required?: Action required by provider: clinical question for provider.  Patient was last seen in primary care on 05/26/2024 by Cleatus Arlyss RAMAN, MD. Called Nurse Triage reporting Allergic Reaction. Symptoms began today. Interventions attempted: OTC medications: cream. Symptoms are: gradually worsening.  Triage Disposition: See Physician Within 24 Hours  Patient/caregiver understands and will follow disposition?: UnsureCopied from CRM [id number]. Topic: Clinical - Red Word Triage >> May 27, 2024  3:02 PM Suzen RAMAN wrote: Red Word that prompted transfer to Nurse Triage: allergic reaction(amoxicillin -clavulanate (AUGMENTIN ) 875-125 MG tablet ) Reason for Disposition  Hives or itching  Answer Assessment - Initial Assessment Questions 1. APPEARANCE of RASH: Describe the rash. (e.g., spots, blisters, raised areas, skin peeling, scaly)   red 2. SIZE: How big are the spots? (e.g., tip of pen, eraser, coin; inches, centimeters)     Random sizes 3. LOCATION: Where is the rash located?    Chin, neck  4. COLOR: What color is the rash? (Note: It is difficult to assess rash color in people with darker-colored skin. When this situation occurs, simply ask the caller to describe what they see.)     red 5. ONSET: When did the rash begin?     Over night  6. FEVER: Do you have a fever? If Yes, ask: What is your temperature, how was it measured, and when did it start?     denies 7. ITCHING: Does the rash itch? If Yes, ask: How bad is the itch? (Scale 1-10; or mild, moderate, severe)     severe 8. CAUSE: What do you think is causing the rash?     Augmentin   9. NEW MEDICINES: What new medicines are you taking? (e.g., name of antibiotic) When did you start taking this medication?.     Augmentin   10. OTHER SYMPTOMS: Do you have any other symptoms? (e.g., sore throat, fever, joint pain)       denies   Pt started Augmentin  yesterday and rash started during night. Pt  has tried OTC medication with no relief of itching. Pt denies any swelling or breathing difficulty. RN advised pt to some Benadryl and to not take anymore of medication until PCP reviews this. RN shared with pt to go to ED if symptoms worsen before hearing back. No appt made at this time. Attempted to reach CAL but no answer.  Please advise.  Protocols used: Rash - Widespread On Drugs-A-AH

## 2024-05-27 NOTE — Telephone Encounter (Signed)
 No lip or tongue swelling. She is just itching like crazy. She took 1/4 of the Ativan (her idea) but then later was told to take some Benadryl and wants to know if that is okay. She said the leg is red with possible fever. But no type of fluid is coming out.

## 2024-05-27 NOTE — Telephone Encounter (Signed)
 Closing encounter. There is a duplicate encounter open

## 2024-05-27 NOTE — Telephone Encounter (Addendum)
 See other note. I updated this note.    Would hold augmentin  for now.    If diffuse itching or if any lip or tongue swelling, then needs to stop med totally.  If any lip or tongue swelling, then needs to go to ER.   Did the pink changes on her leg improve at all on augmentin ?  Please let me know.  Thanks.

## 2024-05-27 NOTE — Telephone Encounter (Signed)
 She can try taking Benadryl once.  If the itching does not improve over the redness is worse, then I recommend her going to the hospital.

## 2024-05-27 NOTE — Telephone Encounter (Signed)
 I saw the other note prior to seeing this.   See other note and check with patient.

## 2024-05-28 ENCOUNTER — Telehealth: Payer: Self-pay

## 2024-05-28 ENCOUNTER — Encounter: Payer: Self-pay | Admitting: Family Medicine

## 2024-05-28 NOTE — Telephone Encounter (Signed)
 Spoke with pt asking for update. C/o L leg swelling and rash is worse. Denies anymore itching. States she is trying to get in with her dermatologist. I asked that she have OV notes faxed to Dr Cleatus. Pt verbalizes understanding.

## 2024-05-28 NOTE — Telephone Encounter (Signed)
 Copied from CRM [id number]. Topic: Clinical - Medical Advice >> May 28, 2024  8:11 AM [name] wrote: Reason for CRM: Patient called I stating that she did not go to the ED   said that she still has the itching this morning and swelling.She said that she took the benadryl but it did not help.She said that she wants to go to [facility] for the itching ,she also stated that she has an appointment with the wound center on July 11th,but she would like to know is there a way Dr Cleatus could get her in sooner.

## 2024-05-28 NOTE — Telephone Encounter (Signed)
 If either the leg swelling and/or rash are worse, then I would rec ER eval.  Thanks.

## 2024-05-28 NOTE — Telephone Encounter (Signed)
 Spoke with pt informing her Dr Cleatus really wants to encourage her to be seen at ED. Pt verbalizes understanding and will get her daughter to take her.

## 2024-05-28 NOTE — Telephone Encounter (Signed)
 Left detailed voicemail for patient to call the office back if any questions/concerns.

## 2024-05-31 NOTE — Telephone Encounter (Signed)
 Agree. Thanks

## 2024-06-02 DIAGNOSIS — L270 Generalized skin eruption due to drugs and medicaments taken internally: Secondary | ICD-10-CM | POA: Diagnosis not present

## 2024-06-05 DIAGNOSIS — G8929 Other chronic pain: Secondary | ICD-10-CM | POA: Diagnosis not present

## 2024-06-05 DIAGNOSIS — M170 Bilateral primary osteoarthritis of knee: Secondary | ICD-10-CM | POA: Diagnosis not present

## 2024-06-10 ENCOUNTER — Ambulatory Visit (INDEPENDENT_AMBULATORY_CARE_PROVIDER_SITE_OTHER)

## 2024-06-10 VITALS — Ht 64.0 in | Wt 193.0 lb

## 2024-06-10 DIAGNOSIS — Z0000 Encounter for general adult medical examination without abnormal findings: Secondary | ICD-10-CM | POA: Diagnosis not present

## 2024-06-10 NOTE — Patient Instructions (Signed)
 Ms. Duffner , Thank you for taking time out of your busy schedule to complete your Annual Wellness Visit with me. I enjoyed our conversation and look forward to speaking with you again next year. I, as well as your care team,  appreciate your ongoing commitment to your health goals. Please review the following plan we discussed and let me know if I can assist you in the future. Your Game plan/ To Do List  Follow up Visits: Next Medicare AWV with our clinical staff: 06/11/25 @ 3pm televisit   Have you seen your provider in the last 6 months (3 months if uncontrolled diabetes)? Yes Next Office Visit with your provider: not  Clinician Recommendations:  Aim for 30 minutes of exercise or brisk walking, 6-8 glasses of water, and 5 servings of fruits and vegetables each day.       This is a list of the screening recommended for you and due dates:  Health Maintenance  Topic Date Due   Zoster (Shingles) Vaccine (1 of 2) 09/23/1989   Colon Cancer Screening  05/30/2017   Medicare Annual Wellness Visit  06/02/2024   Flu Shot  06/26/2024   DTaP/Tdap/Td vaccine (4 - Td or Tdap) 07/31/2031   Pneumococcal Vaccine for age over 18  Completed   DEXA scan (bone density measurement)  Completed   Hepatitis B Vaccine  Aged Out   HPV Vaccine  Aged Out   Meningitis B Vaccine  Aged Out   COVID-19 Vaccine  Discontinued    Advanced directives: (Copy Requested) Please bring a copy of your health care power of attorney and living will to the office to be added to your chart at your convenience. You can mail to [facility] [address] [place], [place] [address] or email to [email or web address] Advance Care Planning is important because it:  [x]  Makes sure you receive the medical care that is consistent with your values, goals, and preferences  [x]  It provides guidance to your family and loved ones and reduces their decisional burden about whether or not they are making the right decisions  based on your wishes.  Follow the link provided in your after visit summary or read over the paperwork we have mailed to you to help you started getting your Advance Directives in place. If you need assistance in completing these, please reach out to us  so that we can help you!

## 2024-06-10 NOTE — Progress Notes (Signed)
 Please attest and cosign this visit due to patients primary care provider not being in the office at the time the visit was completed.    Subjective:   [name] is a 83 y.o. who presents for a Medicare Wellness preventive visit.  As a reminder, Annual Wellness Visits don't include a physical exam, and some assessments may be limited, especially if this visit is performed virtually. We may recommend an in-person follow-up visit with your provider if needed.  Visit Complete: Virtual I connected with  [name] on 06/10/24 by a audio enabled telemedicine application and verified that I am speaking with the correct person using two identifiers.  Patient Location: Home  Provider Location: Home Office  I discussed the limitations of evaluation and management by telemedicine. The patient expressed understanding and agreed to proceed.  Vital Signs: Because this visit was a virtual/telehealth visit, some criteria may be missing or patient reported. Any vitals not documented were not able to be obtained and vitals that have been documented are patient reported.  VideoDeclined- This patient declined Librarian, academic. Therefore the visit was completed with audio only.  Persons Participating in Visit: Patient.  AWV Questionnaire: No: Patient Medicare AWV questionnaire was not completed prior to this visit.  Cardiac Risk Factors include: advanced age (>19men, >26 women);obesity (BMI >30kg/m2);sedentary lifestyle     Objective:    Today's Vitals   06/10/24 1504  Weight: 193 lb (87.5 kg)  Height: 5' 4 (1.626 m)   Body mass index is 33.13 kg/m.     06/10/2024    3:38 PM 06/03/2023    3:08 PM 05/28/2022    3:13 PM 07/30/2021   11:33 AM 04/13/2020    2:16 PM 09/05/2018    3:19 PM 04/26/2017   11:15 AM  Advanced Directives  Does Patient Have a Medical Advance Directive? Yes Yes No No No No  No   Type of Estate agent of  [place];Living will Healthcare Power of [place];Living will       Copy of Healthcare Power of Attorney in Chart? No - copy requested No - copy requested       Would patient like information on creating a medical advance directive?   No - Patient declined No - Patient declined No - Patient declined No - Patient declined       Data saved with a previous flowsheet row definition    Current Medications (verified) Outpatient Encounter Medications as of 06/10/2024  Medication Sig   acetaminophen  (TYLENOL ) 325 MG tablet Take 2 tablets (650 mg total) by mouth 3 (three) times daily as needed.   furosemide  (LASIX ) 20 MG tablet TAKE 1 TABLET (20 MG TOTAL) BY MOUTH DAILY. AS NEEDED   ibuprofen  (ADVIL ) 200 MG tablet Take 1-2 tablets (200-400 mg total) by mouth every 8 (eight) hours as needed (with food, for knee pain).   LORazepam  (ATIVAN ) 0.5 MG tablet Take 0.5-1 tablets (0.25-0.5 mg total) by mouth every 8 (eight) hours as needed for anxiety (sedation caution).   Multiple Vitamins-Minerals (PRESERVISION AREDS 2 PO) Take by mouth.   triamcinolone  cream (KENALOG ) 0.1 % Apply 1 Application topically 2 (two) times daily.   amoxicillin -clavulanate (AUGMENTIN ) 875-125 MG tablet Take 1 tablet by mouth 2 (two) times daily. (Patient not taking: Reported on 06/10/2024)   No facility-administered encounter medications on file as of 06/10/2024.    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   Past Surgical History:  Procedure Laterality Date   BREAST BIOPSY  07/2000   positive right side, [facility]    BREAST LUMPECTOMY  08/20/2000   right breast, malignant stage I, [facility]   CATARACT EXTRACTION  1988, 1990   FOOT SURGERY  1980   minor    Family History  Problem Relation Age of Onset   Prostate cancer Brother    Heart disease Brother    Pancreatitis Brother        x 2   Prostate cancer Brother    Alzheimer's disease Father    Dementia Father    Parkinsonism Mother    Heart disease Brother    Heart disease Brother    Breast cancer Paternal Aunt    Colon cancer Neg Hx    Social History   Socioeconomic History   Marital status: Married    Spouse name: Not on file   Number of children: Not on file   Years of education: Not on file   Highest education level: Not on file  Occupational History   Occupation: Housewife  Tobacco Use   Smoking status: Never   Smokeless tobacco: Never  Vaping Use   Vaping status: Never Used  Substance and Sexual Activity   Alcohol use: No    Alcohol/week: 0.0 standard drinks of alcohol   Drug use: No   Sexual activity: Not Currently  Other Topics Concern   Not on file  Social History Narrative   Patient does not get regular exercise   Daily caffeine-one per day   Widowed 2024.     Was married 1958   Retired from [employer]: Low Risk  (06/10/2024)   Overall Financial Resource Strain (CARDIA)    Difficulty of Paying Living Expenses: Not hard at all  Food Insecurity: No Food Insecurity (06/10/2024)   Hunger Vital Sign    Worried About Running Out of Food in the Last Year: Never true    Ran Out of Food in the Last Year: Never true  Transportation Needs: No Transportation Needs (06/10/2024)   PRAPARE - Administrator, Civil Service (Medical): No    Lack of Transportation (Non-Medical): No  Physical Activity:  Inactive (06/10/2024)   Exercise Vital Sign    Days of Exercise per Week: 0 days    Minutes of Exercise per Session: 0 min  Stress: No Stress Concern Present (06/10/2024)   [employer] of Occupational Health - Occupational Stress Questionnaire    Feeling of Stress: Not at all  Social Connections: Moderately Isolated (06/10/2024)   Social Connection and Isolation Panel    Frequency of Communication with Friends and Family: More than three times a week    Frequency of Social Gatherings with Friends and Family: Once a week    Attends Religious Services: 1 to 4 times per year    Active  Member of Clubs or Organizations: No    Attends Banker Meetings: Never    Marital Status: Widowed    Tobacco Counseling Counseling given: Not Answered    Clinical Intake:  Pre-visit preparation completed: Yes  Pain : No/denies pain     BMI - recorded: 33.13 Nutritional Status: BMI > 30  Obese Nutritional Risks: None Diabetes: No  Lab Results  Component Value Date   HGBA1C 5.5 09/05/2018   HGBA1C 5.6 04/26/2017     How often do you need to have someone help you when you read instructions, pamphlets, or other written materials from your doctor or pharmacy?: 1 - Never  Interpreter Needed?: No  Comments: lives alone Information entered by :: B.Ilea Hilton,LPN   Activities of Daily Living     06/10/2024    3:39 PM  In your present state of health, do you have any difficulty performing the following activities:  Hearing? 1  Vision? 0  Difficulty concentrating or making decisions? 0  Walking or climbing stairs? 1  Dressing or bathing? 0  Doing errands, shopping? 0  Preparing Food and eating ? N  Using the Toilet? N  In the past six months, have you accidently leaked urine? Y  Do you have problems with loss of bowel control? N  Managing your Medications? N  Managing your Finances? N  Housekeeping or managing your Housekeeping? N    Patient Care Team: Cleatus Arlyss RAMAN, MD as PCP - General (Family Medicine) Dingeldein, Elspeth, MD (Ophthalmology) Sharrie Prentice PARAS, DO as Consulting Physician (Sports Medicine)  I have updated your Care Teams any recent Medical Services you may have received from other providers in the past year.     Assessment:   This is a routine wellness examination for [name].  Hearing/Vision screen No results found.   Goals Addressed               This Visit's Progress     COMPLETED: Increase water intake        Starting 09/05/2018, I will attempt to drink at least 6-8 glasses daily.       Patient Stated   On track     06/10/24- I will maintain and continue medications as prescribed.       Patient Stated   Not on track     06/10/24- pt wants to get back active.      Stay Healthy (pt-stated)   On track     06/10/24       Depression Screen     06/10/2024    3:19 PM 10/15/2023   11:32 AM 07/02/2023   10:50 AM 06/03/2023    3:07 PM 12/11/2022    4:16 PM 05/28/2022    3:06 PM 11/01/2020    3:50 PM  PHQ 2/9 Scores  PHQ - 2 Score 2 0 1 0  0 0  PHQ- 9 Score  2 1      Exception Documentation     Patient refusal      Fall Risk     06/10/2024    3:12 PM 10/15/2023   11:32 AM 07/02/2023   10:50 AM 06/03/2023    3:10 PM 03/19/2023    2:39 PM  Fall Risk   Falls in the past year? 0 0 0 0 1  Number falls in past yr: 0 0  0 0  Injury with Fall? 0 0  0 0  Risk for fall due to : No Fall Risks No Fall  Risks  No Fall Risks Impaired mobility  Follow up Education provided;Falls prevention discussed Falls evaluation completed  Falls prevention discussed;Falls evaluation completed Falls evaluation completed    MEDICARE RISK AT HOME:  Medicare Risk at Home Any stairs in or around the home?: Yes (has ramp) If so, are there any without handrails?: Yes Home free of loose throw rugs in walkways, pet beds, electrical cords, etc?: Yes Adequate lighting in your home to reduce risk of falls?: Yes Life alert?: Yes (necklace:local and  out state) Use of a cane, walker or w/c?: Yes Grab bars in the bathroom?: Yes Shower chair or bench in shower?: Yes Elevated toilet seat or a handicapped toilet?: Yes  TIMED UP AND GO:  Was the test performed?  No  Cognitive Function: 6CIT completed    04/13/2020    2:19 PM 09/05/2018    3:19 PM 04/26/2017   11:13 AM  MMSE - Mini Mental State Exam  Orientation to time 5 5 5    Orientation to Place 5 5 5    Registration 3 3 3    Attention/ Calculation 5 0 0   Recall 3 3 3    Language- name 2 objects  0 0   Language- repeat 1 1 1   Language- follow 3 step command  3 3   Language- read & follow direction  0 0   Write a sentence  0 0   Copy design  0 0   Total score  20 20      Data saved with a previous flowsheet row definition        06/10/2024    3:41 PM 06/03/2023    3:13 PM 05/28/2022    3:14 PM  6CIT Screen  What Year? 0 points 0 points 0 points  What month? 0 points 0 points 0 points  What time? 0 points 0 points 0 points  Count back from 20 0 points 0 points 0 points  Months in reverse 0 points 0 points 0 points  Repeat phrase 0 points 0 points 0 points  Total Score 0 points 0 points 0 points    Immunizations Immunization History  Administered Date(s) Administered   Fluad Quad(high Dose 65+) 10/12/2021   Fluad Trivalent(High Dose 65+) 10/15/2023   Influenza Whole 10/07/2006, 10/22/2007, 09/20/2009, 09/20/2010   Influenza,inj,Quad PF,6+ Mos 08/27/2013, 10/28/2014, 11/07/2016, 12/11/2017, 09/05/2018   PFIZER(Purple Top)SARS-COV-2 Vaccination 12/28/2019, 01/18/2020, 10/26/2020   Pneumococcal Conjugate-13 02/24/2016   Pneumococcal Polysaccharide-23 08/23/1999, 05/26/2012   Td 07/16/2006, 03/15/2011   Tdap 07/30/2021   Zoster, Live 03/10/2009    Screening Tests Health Maintenance  Topic Date Due   Zoster Vaccines- Shingrix (1 of 2) 09/23/1989   Colonoscopy  05/30/2017   INFLUENZA VACCINE  06/26/2024   Medicare Annual Wellness (AWV)  06/10/2025   DTaP/Tdap/Td (4  - Td or Tdap) 07/31/2031   Pneumococcal Vaccine: 50+ Years  Completed   DEXA SCAN  Completed   Hepatitis B Vaccines  Aged Out   HPV VACCINES  Aged Out   Meningococcal B Vaccine  Aged Out   COVID-19 Vaccine  Discontinued    Health Maintenance  Health Maintenance Due  Topic Date Due   Zoster Vaccines- Shingrix (1 of 2) 09/23/1989   Colonoscopy  05/30/2017   Health Maintenance Items Addressed: None at this time  Additional Screening:  Vision Screening: Recommended annual ophthalmology exams for early detection of glaucoma and other disorders of the eye. Would you like a referral to an eye doctor? No    Dental Screening:  Recommended annual dental exams for proper oral hygiene  Community Resource Referral / Chronic Care Management: CRR required this visit?  No   CCM required this visit?  No Pt declined to schedule CPE appt: says will make it later   Plan:    I have personally reviewed and noted the following in the patient's chart:   Medical and social history Use of alcohol, tobacco or illicit drugs  Current medications and supplements including opioid prescriptions. Patient is not currently taking opioid prescriptions. Functional ability and status Nutritional status Physical activity Advanced directives List of other physicians Hospitalizations, surgeries, and ER visits in previous 12 months Vitals Screenings to include cognitive, depression, and falls Referrals and appointments  In addition, I have reviewed and discussed with patient certain preventive protocols, quality metrics, and best practice recommendations. A written personalized care plan for preventive services as well as general preventive health recommendations were provided to patient.   Erminio LITTIE Saris, LPN   2/83/7974   After Visit Summary: (MyChart) Due to this being a telephonic visit, the after visit summary with patients personalized plan was offered to patient via MyChart   Notes: Nothing  significant to report at this time.

## 2024-06-17 ENCOUNTER — Encounter: Attending: Physician Assistant | Admitting: Physician Assistant

## 2024-06-17 DIAGNOSIS — I7389 Other specified peripheral vascular diseases: Secondary | ICD-10-CM | POA: Insufficient documentation

## 2024-06-17 DIAGNOSIS — L97822 Non-pressure chronic ulcer of other part of left lower leg with fat layer exposed: Secondary | ICD-10-CM | POA: Insufficient documentation

## 2024-06-17 DIAGNOSIS — M797 Fibromyalgia: Secondary | ICD-10-CM | POA: Diagnosis not present

## 2024-06-17 DIAGNOSIS — I87332 Chronic venous hypertension (idiopathic) with ulcer and inflammation of left lower extremity: Secondary | ICD-10-CM | POA: Insufficient documentation

## 2024-06-17 DIAGNOSIS — S81812A Laceration without foreign body, left lower leg, initial encounter: Secondary | ICD-10-CM | POA: Insufficient documentation

## 2024-06-17 DIAGNOSIS — M170 Bilateral primary osteoarthritis of knee: Secondary | ICD-10-CM | POA: Diagnosis not present

## 2024-06-19 ENCOUNTER — Ambulatory Visit: Admitting: Physician Assistant

## 2024-06-24 ENCOUNTER — Encounter: Admitting: Physician Assistant

## 2024-06-24 DIAGNOSIS — I87332 Chronic venous hypertension (idiopathic) with ulcer and inflammation of left lower extremity: Secondary | ICD-10-CM | POA: Diagnosis not present

## 2024-06-24 DIAGNOSIS — L97822 Non-pressure chronic ulcer of other part of left lower leg with fat layer exposed: Secondary | ICD-10-CM | POA: Diagnosis not present

## 2024-06-24 DIAGNOSIS — S81812A Laceration without foreign body, left lower leg, initial encounter: Secondary | ICD-10-CM | POA: Diagnosis not present

## 2024-07-02 ENCOUNTER — Ambulatory Visit: Admitting: Physician Assistant

## 2024-07-07 ENCOUNTER — Encounter: Attending: Physician Assistant | Admitting: Physician Assistant

## 2024-07-07 DIAGNOSIS — M797 Fibromyalgia: Secondary | ICD-10-CM | POA: Diagnosis not present

## 2024-07-07 DIAGNOSIS — I87322 Chronic venous hypertension (idiopathic) with inflammation of left lower extremity: Secondary | ICD-10-CM | POA: Diagnosis not present

## 2024-07-07 DIAGNOSIS — L97822 Non-pressure chronic ulcer of other part of left lower leg with fat layer exposed: Secondary | ICD-10-CM | POA: Insufficient documentation

## 2024-07-07 DIAGNOSIS — S81812A Laceration without foreign body, left lower leg, initial encounter: Secondary | ICD-10-CM | POA: Diagnosis not present

## 2024-07-07 DIAGNOSIS — M170 Bilateral primary osteoarthritis of knee: Secondary | ICD-10-CM | POA: Insufficient documentation

## 2024-07-10 DIAGNOSIS — M2012 Hallux valgus (acquired), left foot: Secondary | ICD-10-CM | POA: Diagnosis not present

## 2024-07-10 DIAGNOSIS — M2011 Hallux valgus (acquired), right foot: Secondary | ICD-10-CM | POA: Diagnosis not present

## 2024-07-10 DIAGNOSIS — M898X9 Other specified disorders of bone, unspecified site: Secondary | ICD-10-CM | POA: Diagnosis not present

## 2024-07-10 DIAGNOSIS — L600 Ingrowing nail: Secondary | ICD-10-CM | POA: Diagnosis not present

## 2024-07-10 DIAGNOSIS — M2041 Other hammer toe(s) (acquired), right foot: Secondary | ICD-10-CM | POA: Diagnosis not present

## 2024-07-10 DIAGNOSIS — M2042 Other hammer toe(s) (acquired), left foot: Secondary | ICD-10-CM | POA: Diagnosis not present

## 2024-07-10 DIAGNOSIS — B351 Tinea unguium: Secondary | ICD-10-CM | POA: Diagnosis not present

## 2024-07-14 ENCOUNTER — Encounter: Admitting: Physician Assistant

## 2024-07-14 DIAGNOSIS — I87322 Chronic venous hypertension (idiopathic) with inflammation of left lower extremity: Secondary | ICD-10-CM | POA: Diagnosis not present

## 2024-07-14 DIAGNOSIS — L97822 Non-pressure chronic ulcer of other part of left lower leg with fat layer exposed: Secondary | ICD-10-CM | POA: Diagnosis not present

## 2024-07-14 DIAGNOSIS — S81812A Laceration without foreign body, left lower leg, initial encounter: Secondary | ICD-10-CM | POA: Diagnosis not present

## 2024-07-20 ENCOUNTER — Encounter: Payer: Self-pay | Admitting: Family Medicine

## 2024-07-20 ENCOUNTER — Other Ambulatory Visit: Payer: Self-pay | Admitting: Family Medicine

## 2024-07-20 ENCOUNTER — Telehealth: Payer: Self-pay | Admitting: Family Medicine

## 2024-07-20 DIAGNOSIS — R609 Edema, unspecified: Secondary | ICD-10-CM

## 2024-07-20 MED ORDER — SPIRONOLACTONE 25 MG PO TABS: 12.5000 mg | ORAL_TABLET | Freq: Every day | ORAL | 0 refills | Status: AC | PRN

## 2024-07-20 NOTE — Telephone Encounter (Signed)
 Called and spoke with Barnie, daughter.  Relayed Dr. Elfredia message.  She verbalized understanding.

## 2024-07-20 NOTE — Telephone Encounter (Signed)
 I spoke with pts daughter (DPR signed) both legs are swollen, no redness until after taking diuretic and legs itched and turned red; Barnie said redness is better today more of a light pink color today. Itching is better and  pt is in no distress at this time. No CP,SOB, no swelling in mouth throat or tongue and no difficulty breathing. Pt has appt with allergist in couple of weeks; pt has appt at wound care on 07/21/24 and on 07/22/25 has appt at [facility].Deborah scheduled appt with Imagene Solian on 07/23/24 at 12 noon with UC & ED precautions and Barnie voiced understanding. Offered sooner appt but pt could not schedule due to other appts already scheduled. Sending note to Dr Solian and Solian pool. [facility].

## 2024-07-20 NOTE — Telephone Encounter (Signed)
 Could try spironolactone  in the meantime as needed and keep the OV as scheduled. Rx sent.  Would try to elevate legs as much as possible.  Thanks. We can do labs at the OV.

## 2024-07-20 NOTE — Telephone Encounter (Signed)
 Please call pt's daughter about leg swelling.  She likely can't take lasix  or hydrochlorothiazide  due to itching.  She likely needs f/u OV with labs at the visit.  We may be able to rx another short term med in the meantime.  Please see what details you can get.  Thanks.

## 2024-07-21 ENCOUNTER — Ambulatory Visit: Admitting: Internal Medicine

## 2024-07-21 DIAGNOSIS — S81812A Laceration without foreign body, left lower leg, initial encounter: Secondary | ICD-10-CM | POA: Diagnosis not present

## 2024-07-21 DIAGNOSIS — L97822 Non-pressure chronic ulcer of other part of left lower leg with fat layer exposed: Secondary | ICD-10-CM | POA: Diagnosis not present

## 2024-07-21 DIAGNOSIS — I87322 Chronic venous hypertension (idiopathic) with inflammation of left lower extremity: Secondary | ICD-10-CM | POA: Diagnosis not present

## 2024-07-22 DIAGNOSIS — M170 Bilateral primary osteoarthritis of knee: Secondary | ICD-10-CM | POA: Diagnosis not present

## 2024-07-22 DIAGNOSIS — G8929 Other chronic pain: Secondary | ICD-10-CM | POA: Diagnosis not present

## 2024-07-23 ENCOUNTER — Ambulatory Visit: Admitting: Family Medicine

## 2024-07-28 ENCOUNTER — Encounter: Attending: Physician Assistant | Admitting: Physician Assistant

## 2024-07-28 DIAGNOSIS — I87332 Chronic venous hypertension (idiopathic) with ulcer and inflammation of left lower extremity: Secondary | ICD-10-CM | POA: Insufficient documentation

## 2024-07-28 DIAGNOSIS — L97822 Non-pressure chronic ulcer of other part of left lower leg with fat layer exposed: Secondary | ICD-10-CM | POA: Diagnosis not present

## 2024-07-28 DIAGNOSIS — X58XXXA Exposure to other specified factors, initial encounter: Secondary | ICD-10-CM | POA: Insufficient documentation

## 2024-07-28 DIAGNOSIS — M170 Bilateral primary osteoarthritis of knee: Secondary | ICD-10-CM | POA: Diagnosis not present

## 2024-07-28 DIAGNOSIS — M797 Fibromyalgia: Secondary | ICD-10-CM | POA: Insufficient documentation

## 2024-07-28 DIAGNOSIS — S81812A Laceration without foreign body, left lower leg, initial encounter: Secondary | ICD-10-CM | POA: Diagnosis not present

## 2024-07-28 DIAGNOSIS — I7389 Other specified peripheral vascular diseases: Secondary | ICD-10-CM | POA: Insufficient documentation

## 2024-08-11 ENCOUNTER — Encounter: Admitting: Physician Assistant

## 2024-08-11 DIAGNOSIS — L97822 Non-pressure chronic ulcer of other part of left lower leg with fat layer exposed: Secondary | ICD-10-CM | POA: Diagnosis not present

## 2024-08-11 DIAGNOSIS — S81812A Laceration without foreign body, left lower leg, initial encounter: Secondary | ICD-10-CM | POA: Diagnosis not present

## 2024-08-11 DIAGNOSIS — I87332 Chronic venous hypertension (idiopathic) with ulcer and inflammation of left lower extremity: Secondary | ICD-10-CM | POA: Diagnosis not present

## 2024-08-12 DIAGNOSIS — J301 Allergic rhinitis due to pollen: Secondary | ICD-10-CM | POA: Diagnosis not present

## 2024-08-12 DIAGNOSIS — T7840XA Allergy, unspecified, initial encounter: Secondary | ICD-10-CM | POA: Diagnosis not present

## 2024-08-12 DIAGNOSIS — J305 Allergic rhinitis due to food: Secondary | ICD-10-CM | POA: Diagnosis not present

## 2024-08-25 ENCOUNTER — Encounter: Admitting: Physician Assistant

## 2024-08-25 DIAGNOSIS — S81812D Laceration without foreign body, left lower leg, subsequent encounter: Secondary | ICD-10-CM | POA: Diagnosis not present

## 2024-08-25 DIAGNOSIS — L97822 Non-pressure chronic ulcer of other part of left lower leg with fat layer exposed: Secondary | ICD-10-CM | POA: Diagnosis not present

## 2024-08-25 DIAGNOSIS — I87332 Chronic venous hypertension (idiopathic) with ulcer and inflammation of left lower extremity: Secondary | ICD-10-CM | POA: Diagnosis not present

## 2024-10-26 ENCOUNTER — Ambulatory Visit: Admitting: Family Medicine

## 2024-10-26 VITALS — BP 122/80 | HR 58 | Temp 97.6°F

## 2024-10-26 DIAGNOSIS — R609 Edema, unspecified: Secondary | ICD-10-CM | POA: Diagnosis not present

## 2024-10-26 DIAGNOSIS — Z23 Encounter for immunization: Secondary | ICD-10-CM

## 2024-10-26 DIAGNOSIS — M1710 Unilateral primary osteoarthritis, unspecified knee: Secondary | ICD-10-CM

## 2024-10-26 MED ORDER — TRAMADOL HCL 50 MG PO TABS: 25.0000 mg | ORAL_TABLET | Freq: Three times a day (TID) | ORAL | 0 refills | Status: AC | PRN

## 2024-10-26 NOTE — Patient Instructions (Addendum)
 Flu shot today.  Go to the lab on the way out.   If you have mychart we'll likely use that to update you.    Take care.  Glad to see you. You could try tramadol in the meantime.  If that isn't helping, then it would be reasonable to see the knee surgeons to ask about options.

## 2024-10-26 NOTE — Progress Notes (Unsigned)
 Knee pain.  Was scheduled for potential injection.  She had prev injections.  Brief relief with prev injections per ortho, last injection only helped for a few days.  Having more pain with sitting, not just walking.  Taking ibuprofen  w/o relief.  Previous x-rays reviewed with patient with bone-on-bone findings.  Flu shot done at office visit.  Rare use of spironolactone .    Meds, vitals, and allergies reviewed.   ROS: Per HPI unless specifically indicated in ROS section   Nad Ncat Sitting in wheelchair. Neck supple, no LA Rrr Ctab B crepitus on knee ROM.  1+ BLE edema.

## 2024-10-27 LAB — CBC WITH DIFFERENTIAL/PLATELET
Basophils Absolute: 0 K/uL (ref 0.0–0.1)
Basophils Relative: 0.6 % (ref 0.0–3.0)
Eosinophils Absolute: 0.3 K/uL (ref 0.0–0.7)
Eosinophils Relative: 5 % (ref 0.0–5.0)
HCT: 40.4 % (ref 36.0–46.0)
Hemoglobin: 13.6 g/dL (ref 12.0–15.0)
Lymphocytes Relative: 30.8 % (ref 12.0–46.0)
Lymphs Abs: 1.7 K/uL (ref 0.7–4.0)
MCHC: 33.8 g/dL (ref 30.0–36.0)
MCV: 88.5 fl (ref 78.0–100.0)
Monocytes Absolute: 0.5 K/uL (ref 0.1–1.0)
Monocytes Relative: 8.3 % (ref 3.0–12.0)
Neutro Abs: 3.1 K/uL (ref 1.4–7.7)
Neutrophils Relative %: 55.3 % (ref 43.0–77.0)
Platelets: 218 K/uL (ref 150.0–400.0)
RBC: 4.56 Mil/uL (ref 3.87–5.11)
RDW: 13 % (ref 11.5–15.5)
WBC: 5.5 K/uL (ref 4.0–10.5)

## 2024-10-27 LAB — COMPREHENSIVE METABOLIC PANEL WITH GFR
ALT: 10 U/L (ref 0–35)
AST: 14 U/L (ref 0–37)
Albumin: 4.3 g/dL (ref 3.5–5.2)
Alkaline Phosphatase: 110 U/L (ref 39–117)
BUN: 19 mg/dL (ref 6–23)
CO2: 29 meq/L (ref 19–32)
Calcium: 9.4 mg/dL (ref 8.4–10.5)
Chloride: 106 meq/L (ref 96–112)
Creatinine, Ser: 0.86 mg/dL (ref 0.40–1.20)
GFR: 61.74 mL/min (ref >60.00)
Glucose, Bld: 87 mg/dL (ref 70–99)
Potassium: 4.8 meq/L (ref 3.5–5.1)
Sodium: 142 meq/L (ref 135–145)
Total Bilirubin: 0.9 mg/dL (ref 0.2–1.2)
Total Protein: 6.5 g/dL (ref 6.0–8.3)

## 2024-10-28 ENCOUNTER — Ambulatory Visit: Payer: Self-pay | Admitting: Family Medicine

## 2024-10-28 NOTE — Assessment & Plan Note (Signed)
 Rare use of spironolactone .  See notes on labs.

## 2024-10-28 NOTE — Assessment & Plan Note (Signed)
 I doubt she is going to have significant benefit from repeat injection.  Discussed options. She could try tramadol in the meantime.  Routine cautions given to patient.  If that isn't helping, then it would be reasonable to see the knee surgeons to ask about options.  Discussed.

## 2025-06-11 ENCOUNTER — Ambulatory Visit
